# Patient Record
Sex: Female | Born: 1970 | Race: Black or African American | Hispanic: No | Marital: Married | Smoking: Current every day smoker
Health system: Southern US, Community
[De-identification: ages and names within clinical notes are randomized; demographics above are authoritative.]

## PROBLEM LIST (undated history)

## (undated) DIAGNOSIS — I10 Essential (primary) hypertension: Secondary | ICD-10-CM

## (undated) DIAGNOSIS — F32A Depression, unspecified: Secondary | ICD-10-CM

## (undated) DIAGNOSIS — E7800 Pure hypercholesterolemia, unspecified: Secondary | ICD-10-CM

## (undated) DIAGNOSIS — D649 Anemia, unspecified: Secondary | ICD-10-CM

## (undated) DIAGNOSIS — J45909 Unspecified asthma, uncomplicated: Secondary | ICD-10-CM

## (undated) DIAGNOSIS — E119 Type 2 diabetes mellitus without complications: Secondary | ICD-10-CM

## (undated) DIAGNOSIS — R519 Headache, unspecified: Secondary | ICD-10-CM

## (undated) HISTORY — PX: OTHER SURGICAL HISTORY: SHX169

## (undated) HISTORY — PX: TYMPANOSTOMY TUBE PLACEMENT: SHX32

## (undated) HISTORY — PX: TONSILLECTOMY: SUR1361

## (undated) HISTORY — PX: WISDOM TOOTH EXTRACTION: SHX21

---

## 2006-07-31 HISTORY — PX: OTHER SURGICAL HISTORY: SHX169

## 2013-03-07 DIAGNOSIS — F909 Attention-deficit hyperactivity disorder, unspecified type: Secondary | ICD-10-CM | POA: Diagnosis present

## 2013-03-07 DIAGNOSIS — Z720 Tobacco use: Secondary | ICD-10-CM | POA: Diagnosis present

## 2013-03-07 DIAGNOSIS — E119 Type 2 diabetes mellitus without complications: Secondary | ICD-10-CM

## 2013-03-07 DIAGNOSIS — J45909 Unspecified asthma, uncomplicated: Secondary | ICD-10-CM | POA: Diagnosis present

## 2013-03-07 DIAGNOSIS — G43909 Migraine, unspecified, not intractable, without status migrainosus: Secondary | ICD-10-CM | POA: Diagnosis present

## 2017-01-23 DIAGNOSIS — F3132 Bipolar disorder, current episode depressed, moderate: Secondary | ICD-10-CM | POA: Diagnosis present

## 2018-06-04 ENCOUNTER — Emergency Department (HOSPITAL_COMMUNITY)
Admission: EM | Admit: 2018-06-04 | Discharge: 2018-06-04 | Disposition: A | Discharge status: Home / Self Care | Payer: BLUE CROSS/BLUE SHIELD | Attending: Emergency Medicine | Admitting: Emergency Medicine

## 2018-06-04 ENCOUNTER — Emergency Department (HOSPITAL_COMMUNITY): Payer: BLUE CROSS/BLUE SHIELD

## 2018-06-04 ENCOUNTER — Encounter (HOSPITAL_COMMUNITY): Payer: Self-pay | Admitting: Emergency Medicine

## 2018-06-04 ENCOUNTER — Other Ambulatory Visit: Payer: Self-pay

## 2018-06-04 DIAGNOSIS — I10 Essential (primary) hypertension: Secondary | ICD-10-CM | POA: Insufficient documentation

## 2018-06-04 DIAGNOSIS — R739 Hyperglycemia, unspecified: Secondary | ICD-10-CM | POA: Insufficient documentation

## 2018-06-04 DIAGNOSIS — G43909 Migraine, unspecified, not intractable, without status migrainosus: Secondary | ICD-10-CM | POA: Diagnosis not present

## 2018-06-04 DIAGNOSIS — F17210 Nicotine dependence, cigarettes, uncomplicated: Secondary | ICD-10-CM | POA: Diagnosis not present

## 2018-06-04 DIAGNOSIS — Z91040 Latex allergy status: Secondary | ICD-10-CM | POA: Insufficient documentation

## 2018-06-04 DIAGNOSIS — G43009 Migraine without aura, not intractable, without status migrainosus: Secondary | ICD-10-CM

## 2018-06-04 DIAGNOSIS — R4781 Slurred speech: Secondary | ICD-10-CM | POA: Diagnosis present

## 2018-06-04 HISTORY — DX: Essential (primary) hypertension: I10

## 2018-06-04 IMAGING — CT CT HEAD W/O CM
3 series · 16 of 46 positions shown, 19 images · non-contrast
Comparison: None.

CLINICAL DATA: Headache.  Chronic neuro deficit.

EXAM:
CT HEAD WITHOUT CONTRAST
TECHNIQUE: Contiguous axial images were obtained from the base of the skull
through the vertex without intravenous contrast.

[Series 2: head wo · axial · 0.38mm/px · z∈[-30,+90]mm · 10 of 29 slices shown, 13 images]
[im 3/29  brain]
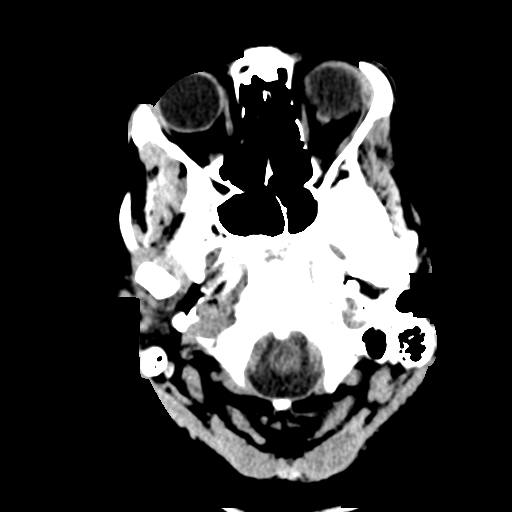
[im 3/29  bone]
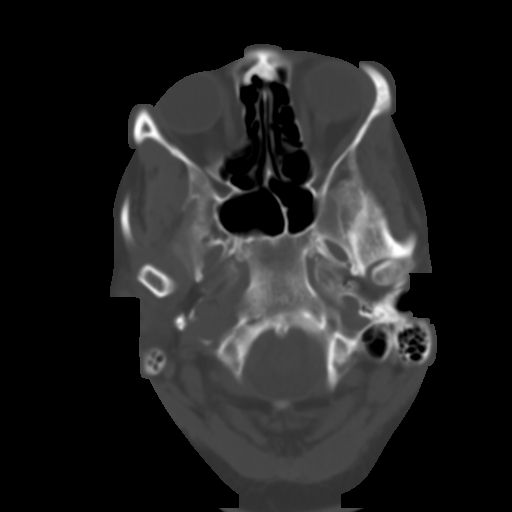
[im 6/29  brain]
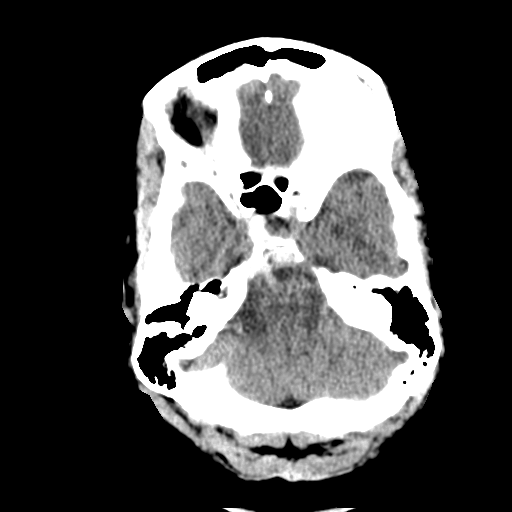
[im 8/29  brain]
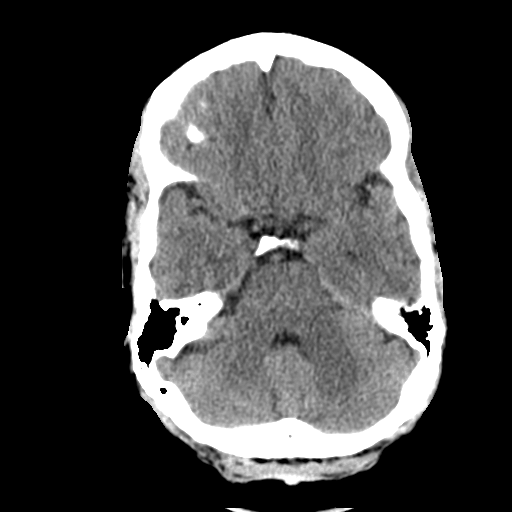
[im 11/29  brain]
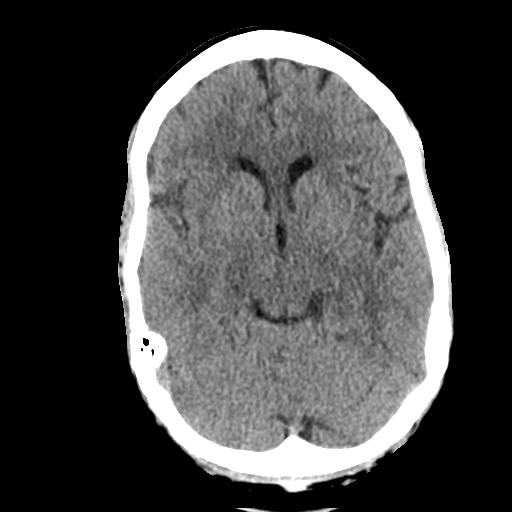
[im 14/29  brain]
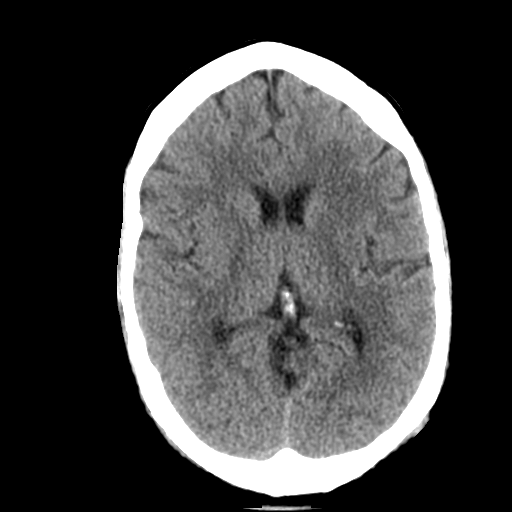
[im 14/29  bone]
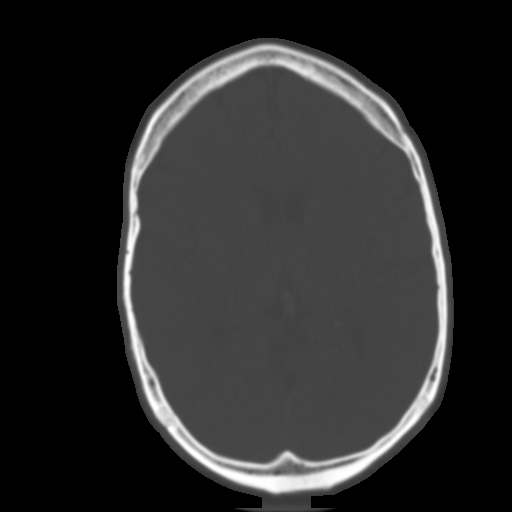
[im 16/29  brain]
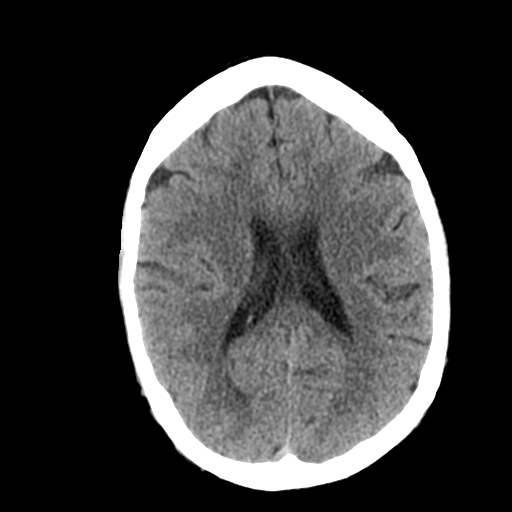
[im 19/29  brain]
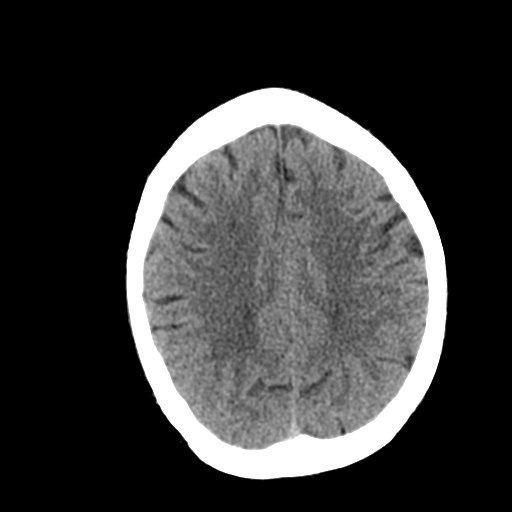
[im 22/29  brain]
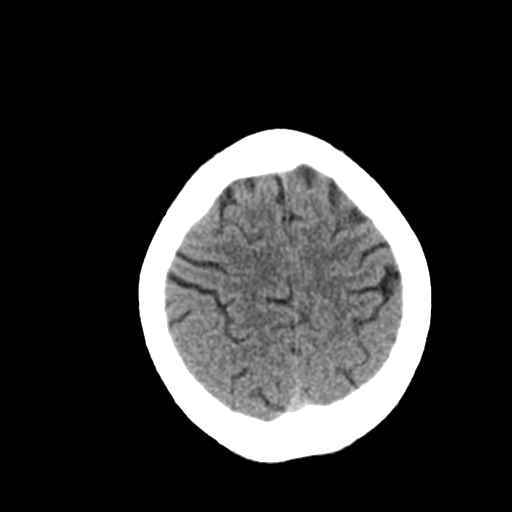
[im 24/29  brain]
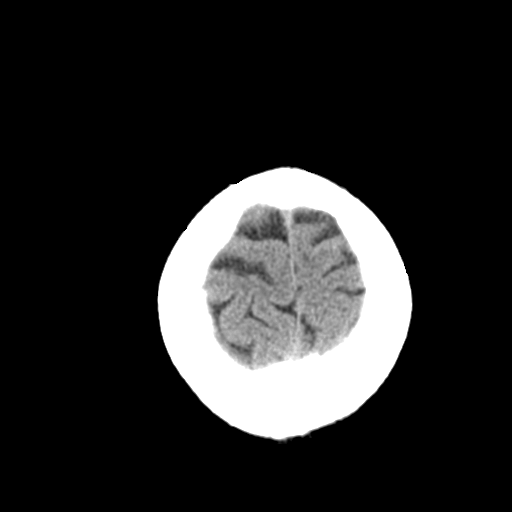
[im 24/29  bone]
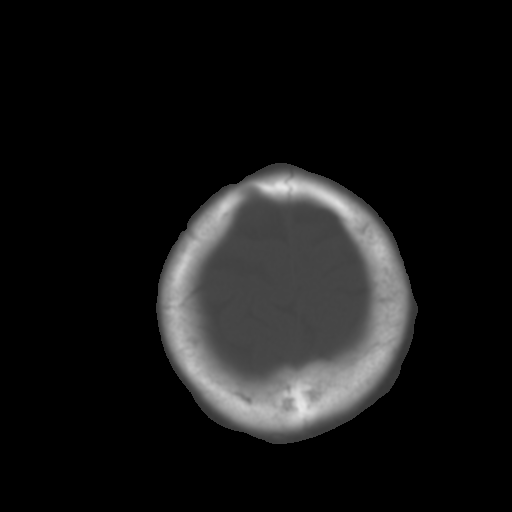
[im 27/29  brain]
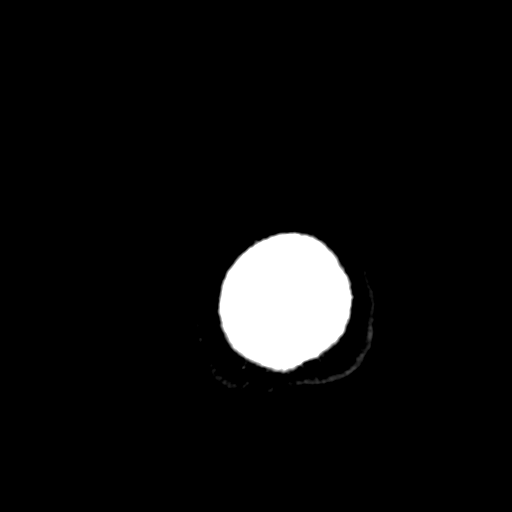

[Series 5: coronal soft tissue · coronal · 0.28mm/px · 3 of 61 slices shown]
[im 21/61  brain]
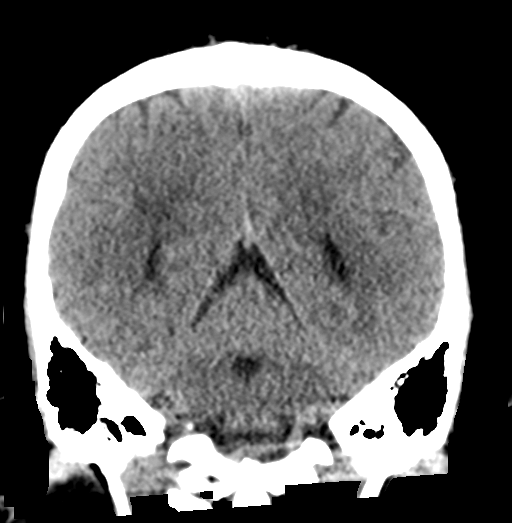
[im 27/61  brain]
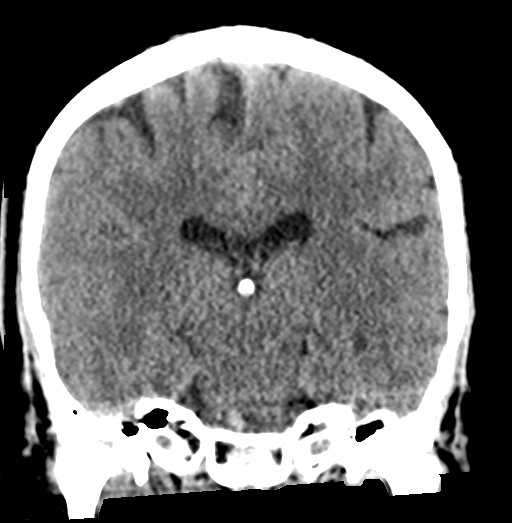
[im 34/61  brain]
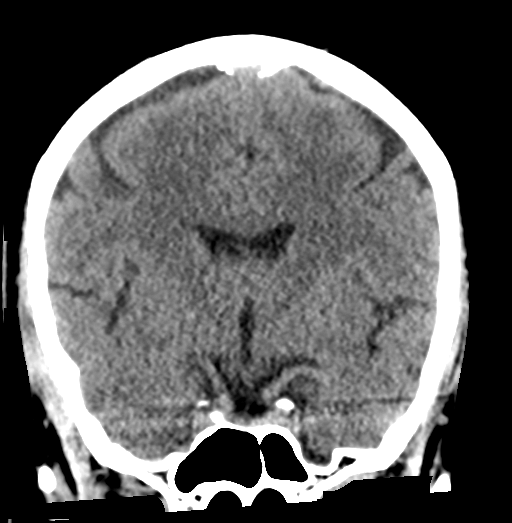

[Series 6: sagittal soft tissue · sagittal · 0.28mm/px · 3 of 48 slices shown]
[im 16/48  brain]
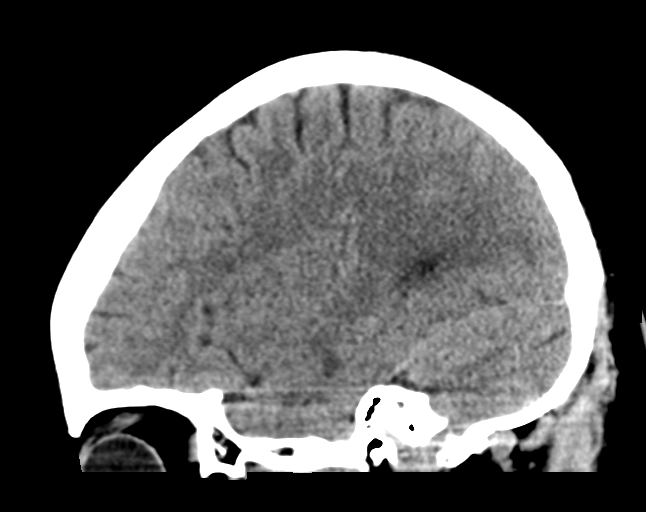
[im 24/48  brain]
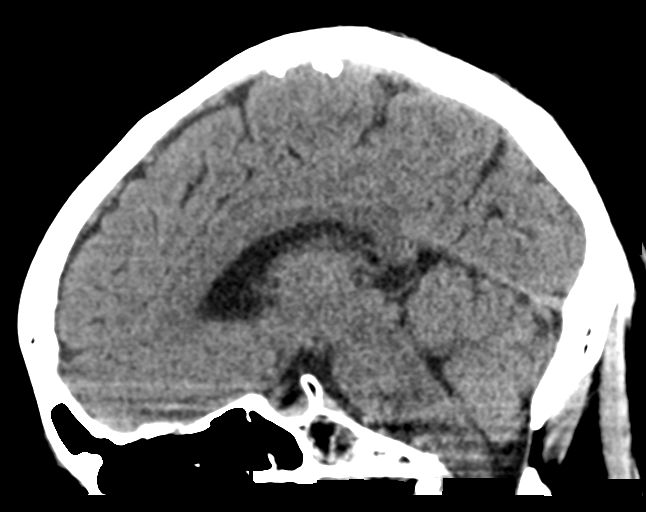
[im 32/48  brain]
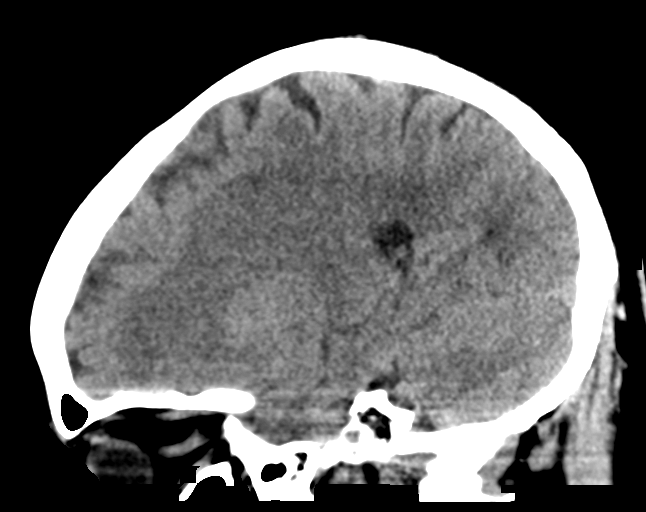

[16 of 46 positions shown; findings below may reference images not displayed]

FINDINGS: Brain: No subdural, epidural, or subarachnoid hemorrhage.
Cerebellum, brainstem, and basal cisterns are normal. Ventricles and
sulci are unremarkable. Mild white matter changes seen posteriorly
in the corona radiata such as on series 2, image 18. No acute
cortical ischemia or infarct is noted. No mass effect or midline
shift.

Vascular: No hyperdense vessel or unexpected calcification.

Skull: Normal. Negative for fracture or focal lesion.

Sinuses/Orbits: No acute finding.

Other: None.
IMPRESSION: 1. Nonspecific white matter changes in the posterior corona radiata
bilaterally. No other significant intracranial abnormalities noted.

## 2018-06-04 MED ORDER — METOCLOPRAMIDE HCL 5 MG/ML IJ SOLN
5.0000 mg | Freq: Once | INTRAMUSCULAR | Status: AC
  Administered 2018-06-04: 5 mg via INTRAVENOUS
  Filled 2018-06-04: qty 2

## 2018-06-04 NOTE — ED Provider Notes (Signed)
[facility]-EMERGENCY DEPT Provider Note   CSN: [id number] Arrival date & time: 06/04/18  1437     History   Chief Complaint Chief Complaint  Patient presents with  . Fall  . multiple complaints  . Headache    HPI Valerie Le - Fischer is a 47 y.o. female.  Patient reports "I think I had a stroke in August 2019.  "  She reports slurred speech difficulty concentrating and intermittent "migraine headaches".  She tripped and fell today.  She reports that she has had somewhat of an unsteady gait since August 2019.  She denies doing herself today.  She presently reports a headache left-sided parietal typical of her "migraine headaches" that she has had for several years.  She treated herself with her migraine medication which she does not recall, without relief.  No other associated symptoms.  She reports no change in her symptoms today.  She just reports "I am tired of it" this particular migraine headache started 3 days ago.  HPI  Past Medical History:  Diagnosis Date  . Hypertension     There are no active problems to display for this patient.   Past Surgical History:  Procedure Laterality Date  . TONSILLECTOMY    . TYMPANOSTOMY TUBE PLACEMENT       OB History   No obstetric history on file.      Home Medications    Prior to Admission medications   Not on File    Family History No family history on file.  Social History Social History   Tobacco Use  . Smoking status: Current Every Day Smoker    Types: Cigarettes  . Smokeless tobacco: Never Used  Substance Use Topics  . Alcohol use: Not on file  . Drug use: Not on file   No alcohol no illicit drug use  Allergies   Coconut oil; Latex; Pineapple; and Tomato   Review of Systems Review of Systems  Constitutional: Negative.   HENT: Negative.   Respiratory: Negative.   Cardiovascular: Negative.   Gastrointestinal: Negative.   Musculoskeletal: Positive for gait problem.   Unsteady gait  Skin: Negative.   Allergic/Immunologic: Positive for immunocompromised state.       Diabetic  Neurological: Positive for speech difficulty and headaches.  Psychiatric/Behavioral: Negative.   All other systems reviewed and are negative.    Physical Exam Updated Vital Signs BP (!) 180/107 (BP Location: Left Arm)   Pulse 87   Temp 98 F (36.7 C) (Oral)   Resp 16   Ht 6' (1.829 m)   Wt 94.3 kg   LMP 05/09/2018   SpO2 99%   BMI 28.21 kg/m   Physical Exam Vitals signs and nursing note reviewed.  Constitutional:      Appearance: She is well-developed.  HENT:     Head: Normocephalic and atraumatic.  Eyes:     Conjunctiva/sclera: Conjunctivae normal.     Pupils: Pupils are equal, round, and reactive to light.  Neck:     Musculoskeletal: Neck supple.     Thyroid: No thyromegaly.     Trachea: No tracheal deviation.  Cardiovascular:     Rate and Rhythm: Normal rate and regular rhythm.     Heart sounds: No murmur.  Pulmonary:     Effort: Pulmonary effort is normal.     Breath sounds: Normal breath sounds.  Abdominal:     General: Bowel sounds are normal. There is no distension.     Palpations: Abdomen is soft.  Tenderness: There is no abdominal tenderness.  Musculoskeletal: Normal range of motion.        General: No tenderness.  Skin:    General: Skin is warm and dry.     Findings: No rash.  Neurological:     General: No focal deficit present.     Mental Status: She is alert and oriented to person, place, and time.     Coordination: Coordination normal.     Comments: Neuro nerves II through XII grossly intact.  Gait normal Romberg normal pronator drift normal finger-to-nose normal DTRs symmetric bilaterally at knee jerk ankle jerk and biceps toes downgoing bilaterally      ED Treatments / Results  Labs (all labs ordered are listed, but only abnormal results are displayed) Labs Reviewed  I-STAT BETA HCG BLOOD, ED ([facility], [facility], [facility] ONLY)     EKG None  Radiology No results found.  Procedures Procedures (including critical care time)  Medications Ordered in ED Medications  metoCLOPramide (REGLAN) injection 5 mg (has no administration in time range)     Initial Impression / Assessment and Plan / ED Course  I have reviewed the triage vital signs and the nursing notes.  Pertinent labs & imaging results that were available during my care of the patient were reviewed by me and considered in my medical decision making (see chart for details).     Lab data i-STAT 8 remarkable for glucose 221 otherwise normal consistent with mild hyperglycemia. No results found for this or any previous visit. Ct Head Wo Contrast  Result Date: 06/04/2018 CLINICAL DATA:  Headache.  Chronic neuro deficit. EXAM: CT HEAD WITHOUT CONTRAST TECHNIQUE: Contiguous axial images were obtained from the base of the skull through the vertex without intravenous contrast. COMPARISON:  None. FINDINGS: Brain: No subdural, epidural, or subarachnoid hemorrhage. Cerebellum, brainstem, and basal cisterns are normal. Ventricles and sulci are unremarkable. Mild white matter changes seen posteriorly in the corona radiata such as on series 2, image 18. No acute cortical ischemia or infarct is noted. No mass effect or midline shift. Vascular: No hyperdense vessel or unexpected calcification. Skull: Normal. Negative for fracture or focal lesion. Sinuses/Orbits: No acute finding. Other: None. IMPRESSION: 1. Nonspecific white matter changes in the posterior corona radiata bilaterally. No other significant intracranial abnormalities noted. Electronically Signed   By: Gerome Prentiss Polio III M.D   On: 06/04/2018 17:19   7:50 PM patient is alert no distress Glasgow Coma Score 15 headache much improved after treatment with intravenous Reglan.  No evidence of stroke.  His neurologic exam was normal I counseled patient for 5 minutes on smoking cessation plan referral neurology,  referral primary care. Final Clinical Impressions(s) / ED Diagnoses  Diagnosis #1 migraine headache #2 hyperglycemia #3 tobacco abuse Final diagnoses:  None    ED Discharge Orders    None       Doug Sou, MD 06/04/18 [phone]

## 2018-06-04 NOTE — ED Triage Notes (Signed)
Pt reports "pt states I think I have had a stroke. The floor has been attacking my feet and having trouble concentrating and having headaches since August."

## 2018-06-04 NOTE — ED Notes (Signed)
Patient transported to CT 

## 2018-06-04 NOTE — Discharge Instructions (Signed)
Blood sugar today was mildly elevated at 221

## 2018-06-05 LAB — I-STAT CHEM 8, ED
BUN: 8 mg/dL (ref 6–20)
Calcium, Ion: 1.13 mmol/L — ABNORMAL LOW (ref 1.15–1.40)
Chloride: 101 mmol/L (ref 98–111)
Creatinine, Ser: 0.4 mg/dL — ABNORMAL LOW (ref 0.44–1.00)
Glucose, Bld: 221 mg/dL — ABNORMAL HIGH (ref 70–99)
HCT: 41 % (ref 36.0–46.0)
HEMOGLOBIN: 13.9 g/dL (ref 12.0–15.0)
Potassium: 3.7 mmol/L (ref 3.5–5.1)
Sodium: 137 mmol/L (ref 135–145)
TCO2: 28 mmol/L (ref 22–32)

## 2018-08-21 DIAGNOSIS — E1169 Type 2 diabetes mellitus with other specified complication: Secondary | ICD-10-CM | POA: Diagnosis present

## 2018-08-21 DIAGNOSIS — E785 Hyperlipidemia, unspecified: Secondary | ICD-10-CM | POA: Diagnosis present

## 2019-11-08 ENCOUNTER — Ambulatory Visit: Payer: BLUE CROSS/BLUE SHIELD | Attending: Internal Medicine

## 2019-11-08 DIAGNOSIS — Z23 Encounter for immunization: Secondary | ICD-10-CM

## 2019-11-08 NOTE — Progress Notes (Signed)
   Covid-19 Vaccination Clinic  Name:  Valerie Le    MRN: [id number] DOB: December 02, 1970  11/08/2019  Ms. Valerie Le was observed post Covid-19 immunization for 15 minutes without incident. She was provided with Vaccine Information Sheet and instruction to access the V-Safe system.   Ms. Valerie Le was instructed to call 911 with any severe reactions post vaccine: Marland Kitchen Difficulty breathing  . Swelling of face and throat  . A fast heartbeat  . A bad rash all over body  . Dizziness and weakness   Immunizations Administered    Name Date Dose VIS Date Route   Pfizer COVID-19 Vaccine 11/08/2019 10:44 AM 0.3 mL 08/13/2018 Intramuscular   Manufacturer: [employer], [employer]   Lot: [id number]   NDC: [address]-3

## 2019-11-29 ENCOUNTER — Ambulatory Visit: Payer: BLUE CROSS/BLUE SHIELD | Attending: Internal Medicine

## 2019-11-29 DIAGNOSIS — Z23 Encounter for immunization: Secondary | ICD-10-CM

## 2019-11-29 NOTE — Progress Notes (Signed)
   Covid-19 Vaccination Clinic  Name:  Barrie Sigmund    MRN: [id number] DOB: June 25, 1970  11/29/2019  Ms. Antonieta Pert Fischer was observed post Covid-19 immunization for 30 minutes based on pre-vaccination screening without incident. She was provided with Vaccine Information Sheet and instruction to access the V-Safe system.   Ms. Della Goo was instructed to call 911 with any severe reactions post vaccine: Marland Kitchen Difficulty breathing  . Swelling of face and throat  . A fast heartbeat  . A bad rash all over body  . Dizziness and weakness   Immunizations Administered    Name Date Dose VIS Date Route   Pfizer COVID-19 Vaccine 11/29/2019 10:22 AM 0.3 mL 08/13/2018 Intramuscular   Manufacturer: [employer], [employer]   Lot: [id number]   NDC: [address]-9

## 2019-12-01 ENCOUNTER — Ambulatory Visit: Payer: BLUE CROSS/BLUE SHIELD

## 2020-12-02 ENCOUNTER — Emergency Department (HOSPITAL_BASED_OUTPATIENT_CLINIC_OR_DEPARTMENT_OTHER): Payer: Medicaid Other

## 2020-12-02 ENCOUNTER — Encounter (HOSPITAL_BASED_OUTPATIENT_CLINIC_OR_DEPARTMENT_OTHER): Payer: Self-pay

## 2020-12-02 ENCOUNTER — Other Ambulatory Visit: Payer: Self-pay

## 2020-12-02 ENCOUNTER — Inpatient Hospital Stay (HOSPITAL_BASED_OUTPATIENT_CLINIC_OR_DEPARTMENT_OTHER)
Admission: EM | Admit: 2020-12-02 | Discharge: 2020-12-09 | DRG: 617 | Disposition: A | Discharge status: Home Health | Payer: Medicaid Other | Attending: Internal Medicine | Admitting: Internal Medicine

## 2020-12-02 DIAGNOSIS — L97519 Non-pressure chronic ulcer of other part of right foot with unspecified severity: Secondary | ICD-10-CM | POA: Diagnosis present

## 2020-12-02 DIAGNOSIS — Z6830 Body mass index (BMI) 30.0-30.9, adult: Secondary | ICD-10-CM | POA: Diagnosis not present

## 2020-12-02 DIAGNOSIS — I10 Essential (primary) hypertension: Secondary | ICD-10-CM | POA: Diagnosis present

## 2020-12-02 DIAGNOSIS — L03115 Cellulitis of right lower limb: Secondary | ICD-10-CM | POA: Diagnosis present

## 2020-12-02 DIAGNOSIS — F909 Attention-deficit hyperactivity disorder, unspecified type: Secondary | ICD-10-CM | POA: Diagnosis present

## 2020-12-02 DIAGNOSIS — Z20822 Contact with and (suspected) exposure to covid-19: Secondary | ICD-10-CM | POA: Diagnosis present

## 2020-12-02 DIAGNOSIS — E1169 Type 2 diabetes mellitus with other specified complication: Principal | ICD-10-CM | POA: Diagnosis present

## 2020-12-02 DIAGNOSIS — J45909 Unspecified asthma, uncomplicated: Secondary | ICD-10-CM | POA: Diagnosis present

## 2020-12-02 DIAGNOSIS — D509 Iron deficiency anemia, unspecified: Secondary | ICD-10-CM | POA: Diagnosis present

## 2020-12-02 DIAGNOSIS — L089 Local infection of the skin and subcutaneous tissue, unspecified: Secondary | ICD-10-CM | POA: Diagnosis present

## 2020-12-02 DIAGNOSIS — Z794 Long term (current) use of insulin: Secondary | ICD-10-CM | POA: Diagnosis not present

## 2020-12-02 DIAGNOSIS — E1140 Type 2 diabetes mellitus with diabetic neuropathy, unspecified: Secondary | ICD-10-CM | POA: Diagnosis present

## 2020-12-02 DIAGNOSIS — Z720 Tobacco use: Secondary | ICD-10-CM | POA: Diagnosis present

## 2020-12-02 DIAGNOSIS — E785 Hyperlipidemia, unspecified: Secondary | ICD-10-CM | POA: Diagnosis present

## 2020-12-02 DIAGNOSIS — E1100 Type 2 diabetes mellitus with hyperosmolarity without nonketotic hyperglycemic-hyperosmolar coma (NKHHC): Secondary | ICD-10-CM | POA: Diagnosis present

## 2020-12-02 DIAGNOSIS — F17210 Nicotine dependence, cigarettes, uncomplicated: Secondary | ICD-10-CM | POA: Diagnosis present

## 2020-12-02 DIAGNOSIS — F3132 Bipolar disorder, current episode depressed, moderate: Secondary | ICD-10-CM | POA: Diagnosis present

## 2020-12-02 DIAGNOSIS — E7800 Pure hypercholesterolemia, unspecified: Secondary | ICD-10-CM | POA: Diagnosis present

## 2020-12-02 DIAGNOSIS — M86171 Other acute osteomyelitis, right ankle and foot: Secondary | ICD-10-CM | POA: Diagnosis present

## 2020-12-02 DIAGNOSIS — I96 Gangrene, not elsewhere classified: Secondary | ICD-10-CM | POA: Diagnosis present

## 2020-12-02 DIAGNOSIS — Z9641 Presence of insulin pump (external) (internal): Secondary | ICD-10-CM | POA: Diagnosis present

## 2020-12-02 DIAGNOSIS — E11621 Type 2 diabetes mellitus with foot ulcer: Secondary | ICD-10-CM | POA: Diagnosis present

## 2020-12-02 DIAGNOSIS — E11628 Type 2 diabetes mellitus with other skin complications: Secondary | ICD-10-CM | POA: Diagnosis present

## 2020-12-02 DIAGNOSIS — G43909 Migraine, unspecified, not intractable, without status migrainosus: Secondary | ICD-10-CM | POA: Diagnosis present

## 2020-12-02 DIAGNOSIS — R739 Hyperglycemia, unspecified: Secondary | ICD-10-CM | POA: Diagnosis not present

## 2020-12-02 DIAGNOSIS — E119 Type 2 diabetes mellitus without complications: Secondary | ICD-10-CM

## 2020-12-02 DIAGNOSIS — E1152 Type 2 diabetes mellitus with diabetic peripheral angiopathy with gangrene: Secondary | ICD-10-CM | POA: Diagnosis present

## 2020-12-02 HISTORY — DX: Pure hypercholesterolemia, unspecified: E78.00

## 2020-12-02 HISTORY — DX: Type 2 diabetes mellitus without complications: E11.9

## 2020-12-02 LAB — COMPREHENSIVE METABOLIC PANEL
ALT: 13 U/L (ref 0–44)
AST: 10 U/L — ABNORMAL LOW (ref 15–41)
Albumin: 2.9 g/dL — ABNORMAL LOW (ref 3.5–5.0)
Alkaline Phosphatase: 116 U/L (ref 38–126)
Anion gap: 10 (ref 5–15)
BUN: 13 mg/dL (ref 6–20)
CO2: 28 mmol/L (ref 22–32)
Calcium: 8.5 mg/dL — ABNORMAL LOW (ref 8.9–10.3)
Chloride: 92 mmol/L — ABNORMAL LOW (ref 98–111)
Creatinine, Ser: 0.75 mg/dL (ref 0.44–1.00)
GFR, Estimated: >60 mL/min (ref >60)
Glucose, Bld: 509 mg/dL (ref 70–99)
Potassium: 3.5 mmol/L (ref 3.5–5.1)
Sodium: 130 mmol/L — ABNORMAL LOW (ref 135–145)
Total Bilirubin: 0.5 mg/dL (ref 0.3–1.2)
Total Protein: 7.8 g/dL (ref 6.5–8.1)

## 2020-12-02 LAB — CBC WITH DIFFERENTIAL/PLATELET
Abs Immature Granulocytes: 0.26 10*3/uL — ABNORMAL HIGH (ref 0.00–0.07)
Basophils Absolute: 0.1 10*3/uL (ref 0.0–0.1)
Basophils Relative: 1 %
Eosinophils Absolute: 0.3 10*3/uL (ref 0.0–0.5)
Eosinophils Relative: 2 %
HCT: 34.6 % — ABNORMAL LOW (ref 36.0–46.0)
Hemoglobin: 11.3 g/dL — ABNORMAL LOW (ref 12.0–15.0)
Immature Granulocytes: 2 %
Lymphocytes Relative: 12 %
Lymphs Abs: 1.8 10*3/uL (ref 0.7–4.0)
MCH: 24.2 pg — ABNORMAL LOW (ref 26.0–34.0)
MCHC: 32.7 g/dL (ref 30.0–36.0)
MCV: 74.1 fL — ABNORMAL LOW (ref 80.0–100.0)
Monocytes Absolute: 0.9 10*3/uL (ref 0.1–1.0)
Monocytes Relative: 6 %
Neutro Abs: 11.6 10*3/uL — ABNORMAL HIGH (ref 1.7–7.7)
Neutrophils Relative %: 77 %
Platelets: 249 10*3/uL (ref 150–400)
RBC: 4.67 MIL/uL (ref 3.87–5.11)
RDW: 15.9 % — ABNORMAL HIGH (ref 11.5–15.5)
WBC: 15 10*3/uL — ABNORMAL HIGH (ref 4.0–10.5)
nRBC: 0 % (ref 0.0–0.2)

## 2020-12-02 LAB — CBG MONITORING, ED
Glucose-Capillary: 488 mg/dL — ABNORMAL HIGH (ref 70–99)
Glucose-Capillary: 410 mg/dL — ABNORMAL HIGH (ref 70–99)

## 2020-12-02 LAB — LACTIC ACID, PLASMA
Lactic Acid, Venous: 1.1 mmol/L (ref 0.5–1.9)
Lactic Acid, Venous: 1.1 mmol/L (ref 0.5–1.9)

## 2020-12-02 LAB — GLUCOSE, CAPILLARY
Glucose-Capillary: 249 mg/dL — ABNORMAL HIGH (ref 70–99)
Glucose-Capillary: 326 mg/dL — ABNORMAL HIGH (ref 70–99)

## 2020-12-02 LAB — RESP PANEL BY RT-PCR (FLU A&B, COVID) ARPGX2
Influenza A by PCR: NEGATIVE
Influenza B by PCR: NEGATIVE
SARS Coronavirus 2 by RT PCR: NEGATIVE

## 2020-12-02 IMAGING — DX DG FOOT COMPLETE 3+V*R*
3 series · 3 of 3 positions shown · non-contrast
Comparison: None.

CLINICAL DATA: Foot ulcer.  Hyperglycemia

EXAM:
RIGHT FOOT COMPLETE - 3+ VIEW

[foot ap]
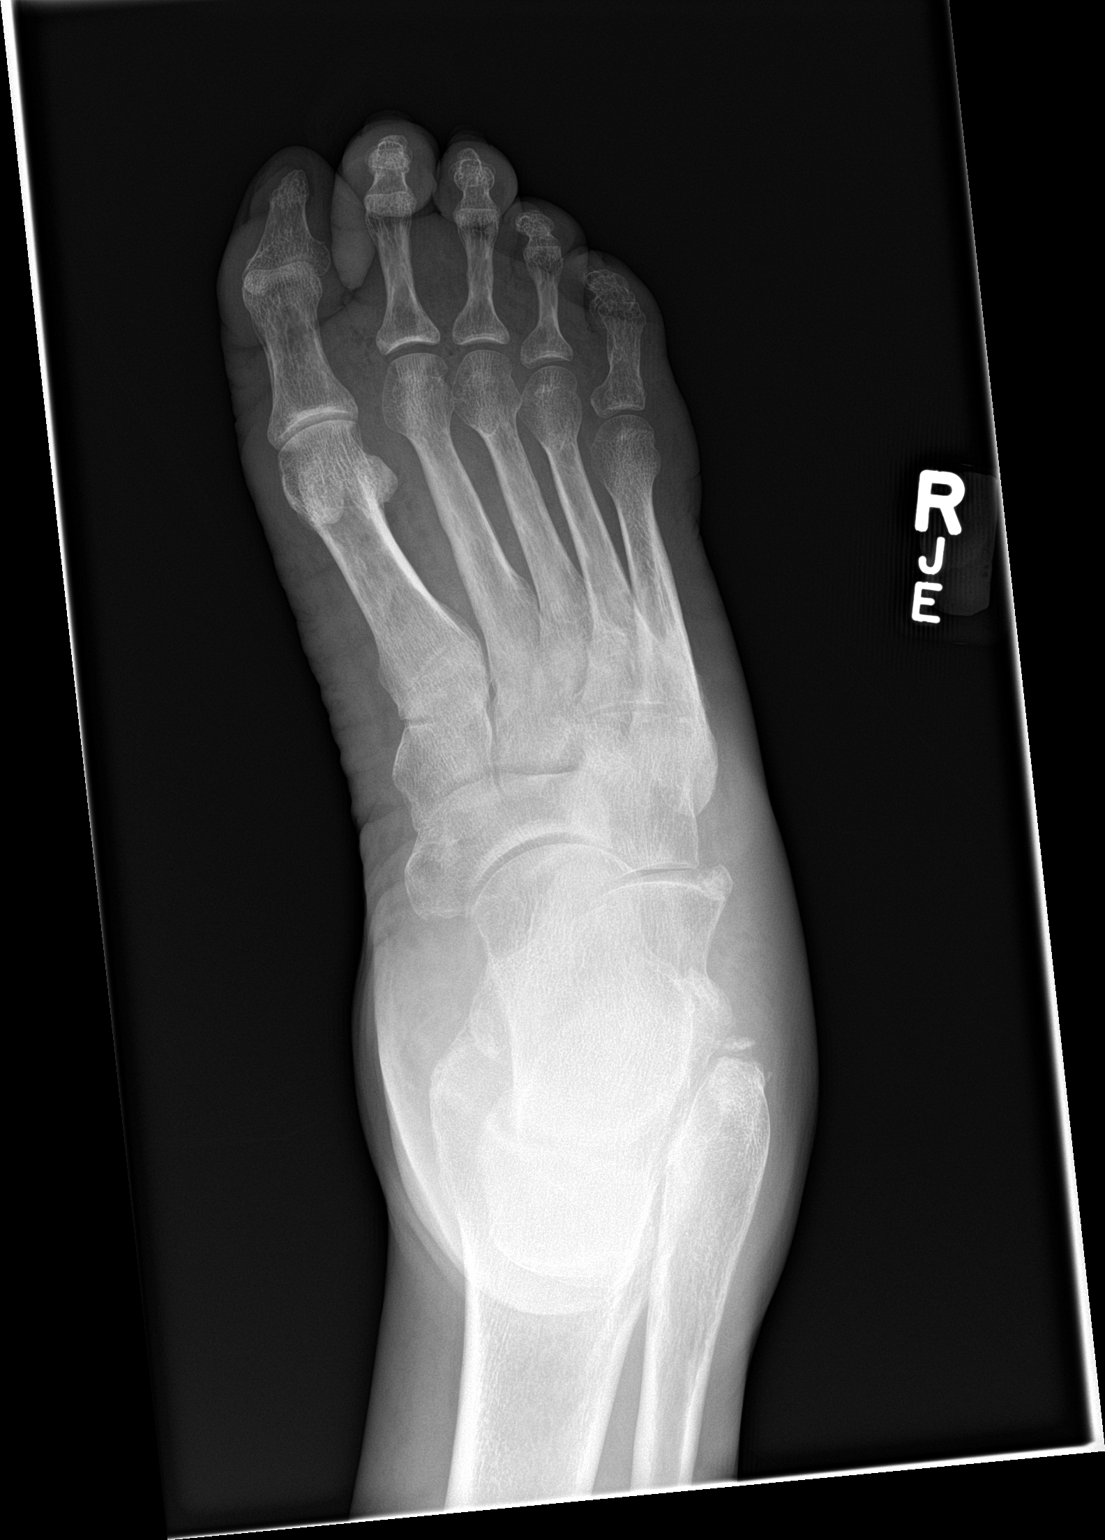

[foot obl]
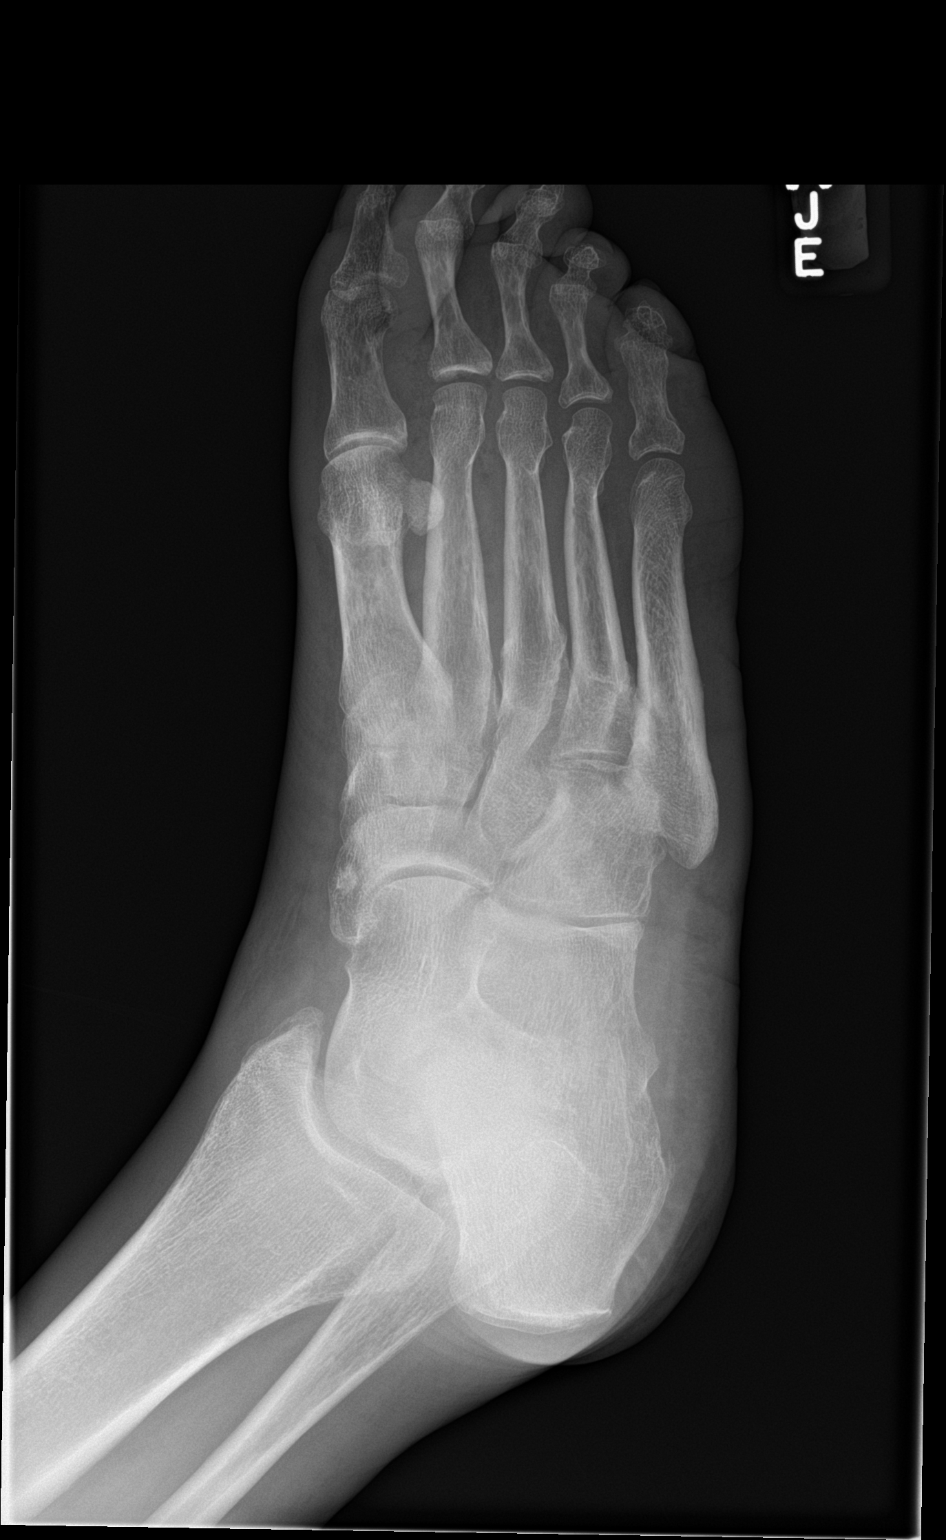

[foot lat]
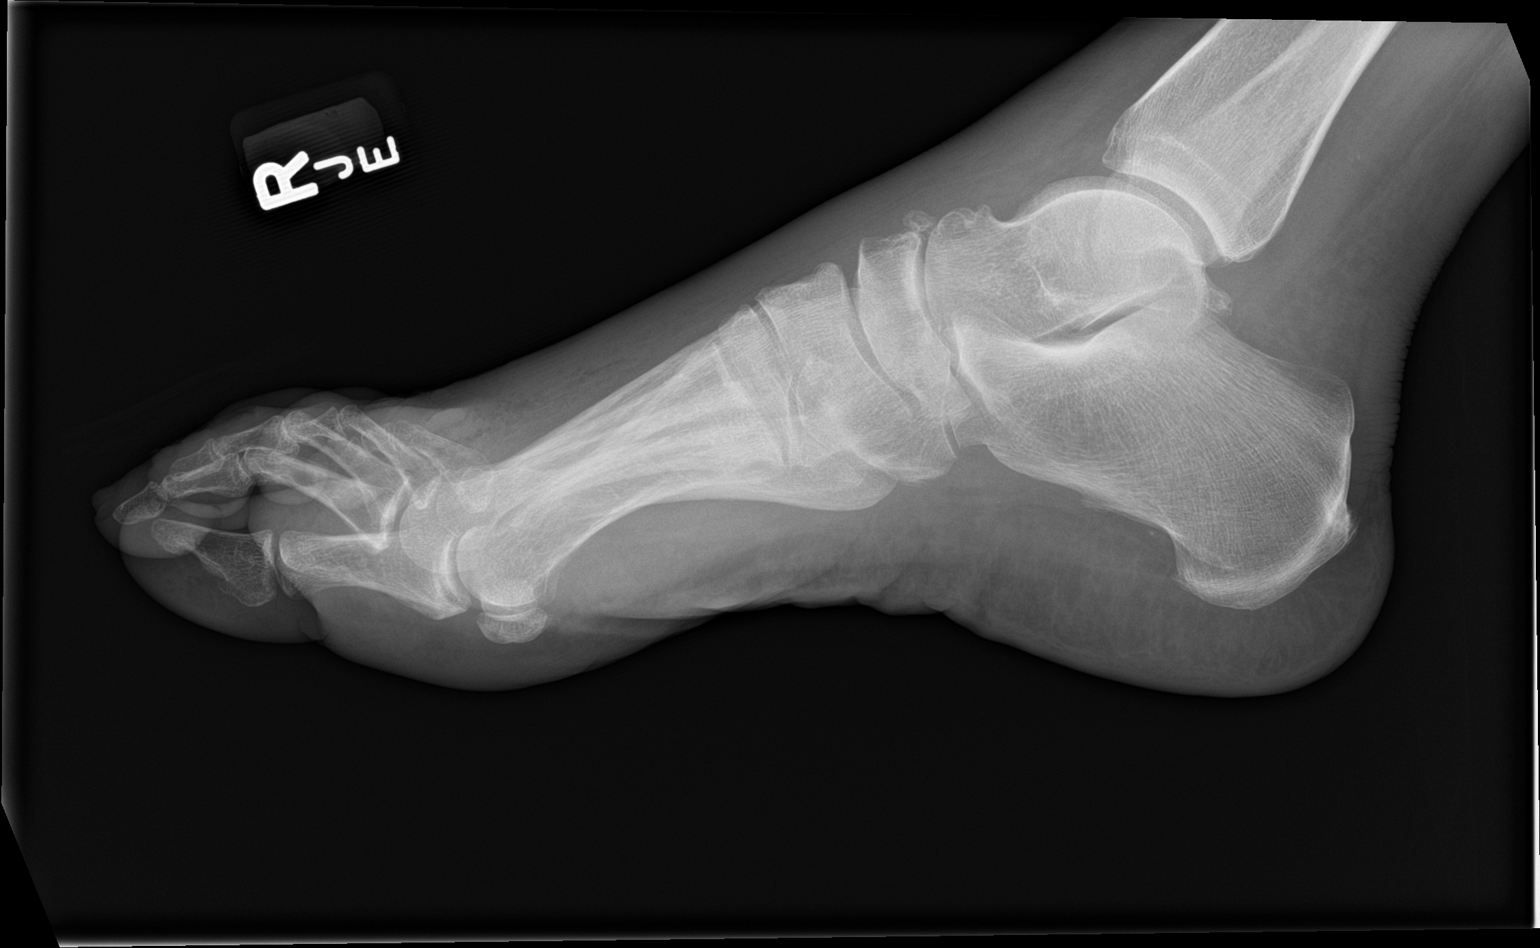

[3 of 3 positions shown; findings below may reference images not displayed]

FINDINGS: No evidence of acute fracture or dislocation in the right foot. No
focal bone lesion or cortical changes to suggest osteomyelitis. Old
ununited ossicles at the distal fibula, calcaneal cuboidal region,
and navicular region. Mild dorsal soft tissue swelling with soft
tissue gas demonstrated over the dorsum of the forefoot. This
suggests cellulitis with gas-forming organism. No radiopaque soft
tissue foreign bodies.
IMPRESSION: No acute fracture or dislocation. Soft tissue swelling with soft
tissue gas consistent with cellulitis. No radiographic changes of
osteomyelitis.

## 2020-12-02 MED ORDER — LACTATED RINGERS IV SOLN: INTRAVENOUS | Status: DC

## 2020-12-02 MED ORDER — VANCOMYCIN HCL IN DEXTROSE 1-5 GM/200ML-% IV SOLN
1000.0000 mg | Freq: Once | INTRAVENOUS | Status: AC
  Administered 2020-12-02: 1000 mg via INTRAVENOUS
  Filled 2020-12-02: qty 200

## 2020-12-02 MED ORDER — INSULIN ASPART 100 UNIT/ML IJ SOLN: 0.0000 [IU] | Freq: Three times a day (TID) | INTRAMUSCULAR | Status: DC

## 2020-12-02 MED ORDER — INSULIN REGULAR(HUMAN) IN NACL 100-0.9 UT/100ML-% IV SOLN
INTRAVENOUS | Status: DC
  Administered 2020-12-02: 13 [IU]/h via INTRAVENOUS
  Filled 2020-12-02: qty 100

## 2020-12-02 MED ORDER — SODIUM CHLORIDE 0.9 % IV SOLN
2.0000 g | Freq: Three times a day (TID) | INTRAVENOUS | Status: DC
  Administered 2020-12-03 – 2020-12-09 (×19): 2 g via INTRAVENOUS
  Filled 2020-12-02 (×21): qty 2

## 2020-12-02 MED ORDER — ONDANSETRON HCL 4 MG PO TABS: 4.0000 mg | ORAL_TABLET | Freq: Four times a day (QID) | ORAL | Status: DC | PRN

## 2020-12-02 MED ORDER — MORPHINE SULFATE (PF) 2 MG/ML IV SOLN
2.0000 mg | INTRAVENOUS | Status: DC | PRN
  Administered 2020-12-03 – 2020-12-06 (×9): 2 mg via INTRAVENOUS
  Filled 2020-12-02 (×9): qty 1

## 2020-12-02 MED ORDER — ONDANSETRON HCL 4 MG/2ML IJ SOLN
4.0000 mg | Freq: Four times a day (QID) | INTRAMUSCULAR | Status: DC | PRN
  Administered 2020-12-05 – 2020-12-08 (×5): 4 mg via INTRAVENOUS
  Filled 2020-12-02 (×2): qty 2

## 2020-12-02 MED ORDER — DEXTROSE 50 % IV SOLN: 0.0000 mL | INTRAVENOUS | Status: DC | PRN

## 2020-12-02 MED ORDER — ACETAMINOPHEN 650 MG RE SUPP: 650.0000 mg | Freq: Four times a day (QID) | RECTAL | Status: DC | PRN

## 2020-12-02 MED ORDER — INSULIN ASPART 100 UNIT/ML IJ SOLN: 0.0000 [IU] | Freq: Every day | INTRAMUSCULAR | Status: DC

## 2020-12-02 MED ORDER — SODIUM CHLORIDE 0.9 % IV BOLUS
500.0000 mL | Freq: Once | INTRAVENOUS | Status: AC
  Administered 2020-12-02: 500 mL via INTRAVENOUS

## 2020-12-02 MED ORDER — ACETAMINOPHEN 325 MG PO TABS
650.0000 mg | ORAL_TABLET | Freq: Four times a day (QID) | ORAL | Status: DC | PRN
  Administered 2020-12-03 (×2): 650 mg via ORAL
  Filled 2020-12-02 (×3): qty 2

## 2020-12-02 MED ORDER — CHLORHEXIDINE GLUCONATE CLOTH 2 % EX PADS
6.0000 | MEDICATED_PAD | Freq: Every day | CUTANEOUS | Status: DC
  Administered 2020-12-03 – 2020-12-09 (×6): 6 via TOPICAL

## 2020-12-02 MED ORDER — ENOXAPARIN SODIUM 40 MG/0.4ML IJ SOSY
40.0000 mg | PREFILLED_SYRINGE | INTRAMUSCULAR | Status: DC
  Administered 2020-12-03 – 2020-12-09 (×7): 40 mg via SUBCUTANEOUS
  Filled 2020-12-02 (×7): qty 0.4

## 2020-12-02 MED ORDER — DEXTROSE IN LACTATED RINGERS 5 % IV SOLN: INTRAVENOUS | Status: DC

## 2020-12-02 MED ORDER — VANCOMYCIN HCL 750 MG/150ML IV SOLN
750.0000 mg | Freq: Three times a day (TID) | INTRAVENOUS | Status: DC
  Administered 2020-12-03 – 2020-12-07 (×13): 750 mg via INTRAVENOUS
  Filled 2020-12-02 (×14): qty 150

## 2020-12-02 MED ORDER — VANCOMYCIN HCL IN DEXTROSE 750-5 MG/150ML-% IV SOLN
750.0000 mg | Freq: Three times a day (TID) | INTRAVENOUS | Status: DC
  Filled 2020-12-02: qty 150

## 2020-12-02 NOTE — ED Triage Notes (Signed)
PT has had wound to top of right foot for a few months. Has seen wound clinic. Since Friday wound worsening. Foot swollen and red with discharge. States blood sugars have been high since Friday as well, running in 400-500s.

## 2020-12-02 NOTE — Progress Notes (Signed)
Pharmacy Antibiotic Note  Valerie Le is a 50 y.o. female admitted on 12/02/2020 with past medical history of diabetes, hypertension, high cholesterol, presents to the emergency department with wound to the top of the right foot  Pharmacy has been consulted to dose vancomycin and cefepime for wound infection.  Plan: Vancomycin 1gm IV x 1 then 750mg  q8h (AUC 519.1, Scr 0.75) Cefepime 2gm IV q8h Follow renal function and clinical course  Height: 5\' 11"  (180.3 cm) Weight: 98.9 kg (218 lb) IBW/kg (Calculated) : 70.8  Temp (24hrs), Avg:98.3 F (36.8 C), Min:98.1 F (36.7 C), Max:98.4 F (36.9 C)  Recent Labs  Lab 12/02/20 1906 12/02/20 2041  WBC 15.0*  --   CREATININE 0.75  --   LATICACIDVEN 1.1 1.1    Estimated Creatinine Clearance: 108.9 mL/min (by C-G formula based on SCr of 0.75 mg/dL).    Allergies  Allergen Reactions   Coconut Oil Anaphylaxis   Pineapple Anaphylaxis   Tomato Anaphylaxis   Latex Hives   Metformin And Related Other (See Comments)    Constipation/cramps    Antimicrobials this admission: 6/16 vanc >> 6/17 cefepime >>  Dose adjustments this admission:   Microbiology results: 6/16 BCx:  6/16 MRSA PCR:   Thank you for allowing pharmacy to be a part of this patient's care.  7/16 RPh 12/02/2020, 11:50 PM

## 2020-12-02 NOTE — ED Provider Notes (Signed)
Emergency Department Provider Note   I have reviewed the triage vital signs and the nursing notes.   HISTORY  Chief Complaint Wound Infection and Hyperglycemia   HPI Valerie Le is a 50 y.o. female with past medical history of diabetes, hypertension, high cholesterol, presents to the emergency department with wound to the top of the right foot.  She states she is been dealing with issues with this foot since December.  She is followed at the [facility] wound care center and has been following there since that time.  She noticed sudden worsening of the wound on the right foot with some drainage and redness/swelling over the right foot over the past several days.  She had a leftover prescription for amoxicillin and began taking that for the past 2 days with continued/worsening symptoms.  She denies fevers.  She has mild pain in the foot but notes a history of neuropathy there as well.  Denies injury.   Past Medical History:  Diagnosis Date   Diabetes mellitus without complication (HCC)    High cholesterol    Hypertension     There are no problems to display for this patient.   Past Surgical History:  Procedure Laterality Date   TONSILLECTOMY     TYMPANOSTOMY TUBE PLACEMENT      Allergies Coconut oil, Pineapple, Tomato, Latex, and Metformin and related  History reviewed. No pertinent family history.  Social History Social History   Tobacco Use   Smoking status: Every Day    Pack years: 0.00    Types: Cigarettes   Smokeless tobacco: Never  Vaping Use   Vaping Use: Never used  Substance Use Topics   Alcohol use: Never   Drug use: Never    Review of Systems  Constitutional: No fever/chills Eyes: No visual changes. ENT: No sore throat. Cardiovascular: Denies chest pain. Respiratory: Denies shortness of breath. Gastrointestinal: No abdominal pain.  No nausea, no vomiting.  No diarrhea.  No constipation. Genitourinary: Negative for dysuria. Musculoskeletal:  Negative for back pain. Skin: Wound to the right foot w/ drainage.  Neurological: Negative for headaches, focal weakness or numbness.  10-point ROS otherwise negative.  ____________________________________________   PHYSICAL EXAM:  VITAL SIGNS: ED Triage Vitals  Enc Vitals Group     BP 12/02/20 1707 134/83     Pulse Rate 12/02/20 1707 (!) 116     Resp 12/02/20 1707 18     Temp 12/02/20 1707 98.4 F (36.9 C)     Temp Source 12/02/20 1707 Oral     SpO2 12/02/20 1707 98 %     Weight 12/02/20 1708 221 lb (100.2 kg)     Height 12/02/20 1708 5\' 11"  (1.803 m)   Constitutional: Alert and oriented. Well appearing and in no acute distress. Eyes: Conjunctivae are normal.  Head: Atraumatic. Nose: No congestion/rhinnorhea. Mouth/Throat: Mucous membranes are moist.   Neck: No stridor. Cardiovascular: Normal rate, regular rhythm. Good peripheral circulation. Grossly normal heart sounds.   Respiratory: Normal respiratory effort.  No retractions. Lungs CTAB. Gastrointestinal: Soft and nontender. No distention.  Musculoskeletal: No lower extremity tenderness nor edema. No gross deformities of extremities. Neurologic:  Normal speech and language. No gross focal neurologic deficits are appreciated.  Skin:  Skin is warm and dry.    ____________________________________________   LABS (all labs ordered are listed, but only abnormal results are displayed)  Labs Reviewed  COMPREHENSIVE METABOLIC PANEL - Abnormal; Notable for the following components:      Result Value  Sodium 130 (*)    Chloride 92 (*)    Glucose, Bld 509 (*)    Calcium 8.5 (*)    Albumin 2.9 (*)    AST 10 (*)    All other components within normal limits  CBC WITH DIFFERENTIAL/PLATELET - Abnormal; Notable for the following components:   WBC 15.0 (*)    Hemoglobin 11.3 (*)    HCT 34.6 (*)    MCV 74.1 (*)    MCH 24.2 (*)    RDW 15.9 (*)    Neutro Abs 11.6 (*)    Abs Immature Granulocytes 0.26 (*)    All other  components within normal limits  CBG MONITORING, ED - Abnormal; Notable for the following components:   Glucose-Capillary 488 (*)    All other components within normal limits  RESP PANEL BY RT-PCR (FLU A&B, COVID) [id number]  CULTURE, BLOOD (ROUTINE X 2)  CULTURE, BLOOD (ROUTINE X 2)  LACTIC ACID, PLASMA  LACTIC ACID, PLASMA   ____________________________________________  RADIOLOGY  DG Foot Complete Right  Result Date: 12/02/2020 CLINICAL DATA:  Foot ulcer.  Hyperglycemia EXAM: RIGHT FOOT COMPLETE - 3+ VIEW COMPARISON:  None. FINDINGS: No evidence of acute fracture or dislocation in the right foot. No focal bone lesion or cortical changes to suggest osteomyelitis. 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 Mild dorsal soft tissue swelling with soft tissue gas demonstrated over the dorsum of the forefoot. This suggests cellulitis with gas-forming organism. No radiopaque soft tissue foreign bodies. IMPRESSION: No acute fracture or dislocation. Soft tissue swelling with soft tissue gas consistent with cellulitis. No radiographic changes of osteomyelitis. Electronically Signed   By: Burman Nieves M.D.   On: 12/02/2020 18:02    ____________________________________________   PROCEDURES  Procedure(s) performed:   Procedures  CRITICAL CARE Performed by: Maia Plan Total critical care time: 35 minutes Critical care time was exclusive of separately billable procedures and treating other patients. Critical care was necessary to treat or prevent imminent or life-threatening deterioration. Critical care was time spent personally by me on the following activities: development of treatment plan with patient and/or surrogate as well as nursing, discussions with consultants, evaluation of patient's response to treatment, examination of patient, obtaining history from patient or surrogate, ordering and performing treatments and interventions, ordering  and review of laboratory studies, ordering and review of radiographic studies, pulse oximetry and re-evaluation of patient's condition.  Alona Bene, MD Emergency Medicine  ____________________________________________   INITIAL IMPRESSION / ASSESSMENT AND PLAN / ED COURSE  Pertinent labs & imaging results that were available during my care of the patient were reviewed by me and considered in my medical decision making (see chart for details).   Patient with diabetic foot wound worsening over the past week despite self starting amoxicillin.  The foot is diffusely erythematous with wound along the dorsum of the foot.  The x-ray here shows no evidence of obvious osteomyelitis.  If clinical concern persists may consider MRI as an inpatient.  I do think the patient would benefit from inpatient stay.  She is very hyperglycemic with a leukocytosis.  Her lactic acid is normal.  Her tachycardia present on arrival has improved with IV fluids.  Will start vancomycin.   Discussed patient's case with [facility] to request admission. Patient and family (if present) updated with plan. Care transferred to [facility] service.  I reviewed all nursing notes, vitals, pertinent old records, EKGs, labs, imaging (as available).  ____________________________________________  FINAL CLINICAL  IMPRESSION(S) / ED DIAGNOSES  Final diagnoses:  Cellulitis of right lower extremity  Hyperglycemia     MEDICATIONS GIVEN DURING THIS VISIT:  Medications  vancomycin (VANCOCIN) IVPB 1000 mg/200 mL premix (has no administration in time range)  insulin regular, human (MYXREDLIN) 100 units/ 100 mL infusion (has no administration in time range)  lactated ringers infusion (has no administration in time range)  dextrose 5 % in lactated ringers infusion (has no administration in time range)  dextrose 50 % solution 0-50 mL (has no administration in time range)  sodium chloride 0.9 % bolus 500 mL (500 mLs Intravenous New Bag/Given 12/02/20  1936)      Note:  This document was prepared using Dragon voice recognition software and may include unintentional dictation errors.  Alona Bene, MD, [facility] Emergency Medicine    Torre Pikus, Arlyss Repress, MD 12/02/20 [phone]

## 2020-12-02 NOTE — H&P (Signed)
History and Physical   Valerie Le NGE:[id number] DOB: 1971/03/03 DOA: 12/02/2020  Referring MD/NP/PA: Dr. Alona Bene  PCP: Maud Deed, [place]   Outpatient Specialists: None  Patient coming from: [facility] [place]  Chief Complaint: Right foot swelling  HPI: Valerie Le is a 50 y.o. female with medical history significant of insulin-dependent diabetes on insulin pump who is out of her pump and will not be able to get it till July 1, essential hypertension, hyperlipidemia, morbid obesity who presents to med [facility] with wound at the top of her right foot.  This started in December and has been on and off.  She went to the [facility] wound center where they have been trying to manage it.  It started as a small ulceration at the top.  She has noted worsening swelling of the foot redness rise all the way to the ankle today.  She has not been on antibiotics lately.  The symptoms got worse suddenly in the last 2 days.  Her blood sugar was noted to be in the 500s.  Patient reported not being able to feel her insulin pump.  She is not in DKA but appears to have hyperosmolar hyperglycemia.  With the wound appears to have significant cellulitis.  Patient admitted for IV antibiotics and blood sugar control..  ED Course: Temperature 98.4, blood pressure 135/80, pulse 116, respiratory 27 oxygen status 96% on room air.  Sodium 130, potassium 3.5 chloride 92 CO2 28 glucose is 509 BUN 13 creatinine 0.75 calcium 8.5.  Lactic acid is 1.1.  White count 15.1 hemoglobin 11.3 platelets 243.  COVID-19 screen negative.  Blood cultures were obtained.  X-ray of the foot showed no fracture or dislocation no evidence of osteomyelitis on x-ray.  Patient admitted with diabetic foot infection and also as well as hypoglycemia.  Review of Systems: As per HPI otherwise 10 point review of systems negative.    Past Medical History:  Diagnosis Date   Diabetes mellitus without complication  (HCC)    High cholesterol    Hypertension     Past Surgical History:  Procedure Laterality Date   TONSILLECTOMY     TYMPANOSTOMY TUBE PLACEMENT       reports that she has been smoking cigarettes. She has never used smokeless tobacco. She reports that she does not drink alcohol and does not use drugs.  Allergies  Allergen Reactions   Coconut Oil Anaphylaxis   Pineapple Anaphylaxis   Tomato Anaphylaxis   Latex Hives   Metformin And Related Other (See Comments)    Constipation/cramps    History reviewed. No pertinent family history.   Prior to Admission medications   Medication Sig Start Date End Date Taking? Authorizing Provider  ibuprofen (ADVIL,MOTRIN) 200 MG tablet Take 600 mg by mouth every 6 (six) hours as needed for mild pain.    [provider]  Insulin Human (INSULIN PUMP) SOLN Inject into the skin. Novolog 100 units/ml    [provider]  metoCLOPramide (REGLAN) 10 MG tablet Take 10 mg by mouth every 6 (six) hours as needed for nausea.    [provider]  SUMAtriptan (IMITREX) 25 MG tablet Take 25 mg by mouth every 2 (two) hours as needed for migraine. May repeat in 2 hours if headache persists or recurs.    [provider]    Physical Exam: Vitals:   12/02/20 2115 12/02/20 2134 12/02/20 2145 12/02/20 2254  BP: 135/80 122/73 114/67 128/73  Pulse:  100 100 100 (!) 101  Resp: (!) 22 17 18  (!) 27  Temp:    98.1 F (36.7 C)  TempSrc:    Oral  SpO2: 96% 99% 98% 99%  Weight:    98.9 kg  Height:    5\' 11"  (1.803 m)      Constitutional: Obese, ill looking Vitals:   12/02/20 2115 12/02/20 2134 12/02/20 2145 12/02/20 2254  BP: 135/80 122/73 114/67 128/73  Pulse: 100 100 100 (!) 101  Resp: (!) 22 17 18  (!) 27  Temp:    98.1 F (36.7 C)  TempSrc:    Oral  SpO2: 96% 99% 98% 99%  Weight:    98.9 kg  Height:    5\' 11"  (1.803 m)   Eyes: PERRL, lids and conjunctivae normal ENMT: Mucous membranes are dry. Posterior pharynx clear  of any exudate or lesions.Normal dentition.  Neck: normal, supple, no masses, no thyromegaly Respiratory: clear to auscultation bilaterally, no wheezing, no crackles. Normal respiratory effort. No accessory muscle use.  Cardiovascular: Sinus tachycardia, no murmurs / rubs / gallops. No extremity edema. 2+ pedal pulses. No carotid bruits.  Abdomen: no tenderness, no masses palpated. No hepatosplenomegaly. Bowel sounds positive.  Musculoskeletal: no clubbing / cyanosis. No joint deformity upper and lower extremities. Good ROM, no contractures. Normal muscle tone.  Right foot swollen, and open wound above the second toe with disfigurement, redness of the entire foot up to the ankle Skin: no rashes, lesions, ulcers. No induration Neurologic: CN 2-12 grossly intact. Sensation intact, DTR normal. Strength 5/5 in all 4.  Psychiatric: Normal judgment and insight. Alert and oriented x 3. Normal mood.     Labs on Admission: I have personally reviewed following labs and imaging studies  CBC: Recent Labs  Lab 12/02/20 1906  WBC 15.0*  NEUTROABS 11.6*  HGB 11.3*  HCT 34.6*  MCV 74.1*  PLT 249   Basic Metabolic Panel: Recent Labs  Lab 12/02/20 1906  NA 130*  K 3.5  CL 92*  CO2 28  GLUCOSE 509*  BUN 13  CREATININE 0.75  CALCIUM 8.5*   GFR: Estimated Creatinine Clearance: 108.9 mL/min (by C-G formula based on SCr of 0.75 mg/dL). Liver Function Tests: Recent Labs  Lab 12/02/20 1906  AST 10*  ALT 13  ALKPHOS 116  BILITOT 0.5  PROT 7.8  ALBUMIN 2.9*   No results for input(s): LIPASE, AMYLASE in the last 168 hours. No results for input(s): AMMONIA in the last 168 hours. Coagulation Profile: No results for input(s): INR, PROTIME in the last 168 hours. Cardiac Enzymes: No results for input(s): CKTOTAL, CKMB, CKMBINDEX, TROPONINI in the last 168 hours. BNP (last 3 results) No results for input(s): PROBNP in the last 8760 hours. HbA1C: No results for input(s): HGBA1C in the last  72 hours. CBG: Recent Labs  Lab 12/02/20 1709 12/02/20 2135 12/02/20 2244  GLUCAP 488* 410* 326*   Lipid Profile: No results for input(s): CHOL, HDL, LDLCALC, TRIG, CHOLHDL, LDLDIRECT in the last 72 hours. Thyroid Function Tests: No results for input(s): TSH, T4TOTAL, FREET4, T3FREE, THYROIDAB in the last 72 hours. Anemia Panel: No results for input(s): VITAMINB12, FOLATE, FERRITIN, TIBC, IRON, RETICCTPCT in the last 72 hours. Urine analysis: No results found for: COLORURINE, APPEARANCEUR, LABSPEC, PHURINE, GLUCOSEU, HGBUR, BILIRUBINUR, KETONESUR, PROTEINUR, UROBILINOGEN, NITRITE, LEUKOCYTESUR Sepsis Labs: @LABRCNTIP (procalcitonin:4,lacticidven:4) ) Recent Results (from the past 240 hour(s))  Resp Panel by RT-PCR (Flu A&B, Covid) Nasopharyngeal Swab     Status: None   Collection Time: 12/02/20  7:06 PM   Specimen: Nasopharyngeal Swab; Nasopharyngeal(NP) swabs in vial transport medium  Result Value Ref Range Status   SARS Coronavirus 2 by RT PCR NEGATIVE NEGATIVE Final    Comment: (NOTE) SARS-CoV-2 target nucleic acids are NOT DETECTED.  The SARS-CoV-2 RNA is generally detectable in upper respiratory specimens during the acute phase of infection. The lowest concentration of SARS-CoV-2 viral copies this assay can detect is 138 copies/mL. A negative result does not preclude SARS-Cov-2 infection and should not be used as the sole basis for treatment or other patient management decisions. A negative result may occur with  improper specimen collection/handling, submission of specimen other than nasopharyngeal swab, presence of viral mutation(s) within the areas targeted by this assay, and inadequate number of viral copies(<138 copies/mL). A negative result must be combined with clinical observations, patient history, and epidemiological information. The expected result is Negative.  Fact Sheet for Patients:  BloggerCourse.comhttps://www.fda.gov/media/152166/download  Fact Sheet for Healthcare  Providers:  SeriousBroker.ithttps://www.fda.gov/media/152162/download  This test is no t yet approved or cleared by the [place] FDA and  has been authorized for detection and/or diagnosis of SARS-CoV-2 by FDA under an Emergency Use Authorization (EUA). This EUA will remain  in effect (meaning this test can be used) for the duration of the COVID-19 declaration under Section 564(b)(1) of the Act, 21 U.S.C.section 360bbb-3(b)(1), unless the authorization is terminated  or revoked sooner.       Influenza A by PCR NEGATIVE NEGATIVE Final   Influenza B by PCR NEGATIVE NEGATIVE Final    Comment: (NOTE) The Xpert Xpress SARS-CoV-2/FLU/RSV plus assay is intended as an aid in the diagnosis of influenza from Nasopharyngeal swab specimens and should not be used as a sole basis for treatment. Nasal washings and aspirates are unacceptable for Xpert Xpress SARS-CoV-2/FLU/RSV testing.  Fact Sheet for Patients: BloggerCourse.comhttps://www.fda.gov/media/152166/download  Fact Sheet for Healthcare Providers: SeriousBroker.ithttps://www.fda.gov/media/152162/download  This test is not yet approved or cleared by the [place] FDA and has been authorized for detection and/or diagnosis of SARS-CoV-2 by FDA under an Emergency Use Authorization (EUA). This EUA will remain in effect (meaning this test can be used) for the duration of the COVID-19 declaration under Section 564(b)(1) of the Act, 21 U.S.C. section 360bbb-3(b)(1), unless the authorization is terminated or revoked.  Performed at [facility] [place], [address] [place], [place] [id number]      Radiological Exams on Admission: DG Foot Complete Right  Result Date: 12/02/2020 CLINICAL DATA:  Foot ulcer.  Hyperglycemia EXAM: RIGHT FOOT COMPLETE - 3+ VIEW COMPARISON:  None. FINDINGS: No evidence of acute fracture or dislocation in the right foot. No focal bone lesion or cortical changes to suggest osteomyelitis. Old ununited ossicles at the distal fibula, calcaneal cuboidal  region, and navicular region. Mild dorsal soft tissue swelling with soft tissue gas demonstrated over the dorsum of the forefoot. This suggests cellulitis with gas-forming organism. No radiopaque soft tissue foreign bodies. IMPRESSION: No acute fracture or dislocation. Soft tissue swelling with soft tissue gas consistent with cellulitis. No radiographic changes of osteomyelitis. Electronically Signed   By: Burman NievesWilliam  Stevens M.D.   On: 12/02/2020 18:02      Assessment/Plan Principal Problem:   Diabetic foot infection (HCC) Active Problems:   Essential hypertension   Insulin-treated type 2 diabetes mellitus (HCC)   Asthma   ADHD (attention deficit hyperactivity disorder)   Bipolar affective disorder, currently depressed, moderate (HCC)   Morbid obesity due to excess calories (HCC)   Migraine   Tobacco abuse  Hyperlipidemia associated with type 2 diabetes mellitus (HCC)     #1 diabetic foot infection: Patient will be admitted and be treated for polymicrobial infection.  IV vancomycin and cefepime initiated.  Follow blood culture results.  May need an MRI to be sure no osteomyelitis ultimately.  Podiatry consult may also be considered prior to discharge.  In the meantime wound care may be consulted again.  #2 uncontrolled diabetes with hyperosmolar hyperglycemia: Most likely due to patient missing her insulin refills.  She may need to do subcutaneous insulin until her pump can be refilled.  In the meantime she is on insulin drip.  Continue with insulin drip until she is titrated to subcu insulin.  #3 essential hypertension: Patient is on amlodipine and lisinopril.  We will continue  #4 ADHD: Not on treatment right now.  Continue to monitor  #5 hyperlipidemia: On Lipitor 80 mg.  Continue  #6 morbid obesity: Dietary counseling  #7 bipolar disorder: Not on any treatment at home.  Monitor only in the hospital.  #8 tobacco abuse: Counseling with nicotine patch  #9 history of migraine  headaches: On as needed Imitrex.  Continue   DVT prophylaxis: Lovenox Code Status: Full code Family Communication: No family at bedside Disposition Plan: Home Consults called: None Admission status: Inpatient  Severity of Illness: The appropriate patient status for this patient is INPATIENT. Inpatient status is judged to be reasonable and necessary in order to provide the required intensity of service to ensure the patient's safety. The patient's presenting symptoms, physical exam findings, and initial radiographic and laboratory data in the context of their chronic comorbidities is felt to place them at high risk for further clinical deterioration. Furthermore, it is not anticipated that the patient will be medically stable for discharge from the hospital within 2 midnights of admission. The following factors support the patient status of inpatient.   " The patient's presenting symptoms include right foot wound. " The worrisome physical exam findings include swollen right foot with obvious wound and Cellulitis " The initial radiographic and laboratory data are worrisome because of no evidence of osteomyelitis. " The chronic co-morbidities include insulin-dependent diabetes.   * I certify that at the point of admission it is my clinical judgment that the patient will require inpatient hospital care spanning beyond 2 midnights from the point of admission due to high intensity of service, high risk for further deterioration and high frequency of surveillance required.Lonia Blood MD [facility] Pager [phone]  If 7PM-7AM, please contact night-coverage [email or web address] Password [facility]  12/02/2020, 11:06 PM

## 2020-12-03 ENCOUNTER — Inpatient Hospital Stay (HOSPITAL_COMMUNITY): Payer: Medicaid Other

## 2020-12-03 LAB — CBC
HCT: 29.1 % — ABNORMAL LOW (ref 36.0–46.0)
HCT: 30.7 % — ABNORMAL LOW (ref 36.0–46.0)
Hemoglobin: 9.5 g/dL — ABNORMAL LOW (ref 12.0–15.0)
Hemoglobin: 9.9 g/dL — ABNORMAL LOW (ref 12.0–15.0)
MCH: 24.4 pg — ABNORMAL LOW (ref 26.0–34.0)
MCH: 24.8 pg — ABNORMAL LOW (ref 26.0–34.0)
MCHC: 32.2 g/dL (ref 30.0–36.0)
MCHC: 32.6 g/dL (ref 30.0–36.0)
MCV: 75.6 fL — ABNORMAL LOW (ref 80.0–100.0)
MCV: 76 fL — ABNORMAL LOW (ref 80.0–100.0)
Platelets: 235 10*3/uL (ref 150–400)
Platelets: 246 10*3/uL (ref 150–400)
RBC: 3.83 MIL/uL — ABNORMAL LOW (ref 3.87–5.11)
RBC: 4.06 MIL/uL (ref 3.87–5.11)
RDW: 16 % — ABNORMAL HIGH (ref 11.5–15.5)
RDW: 16.1 % — ABNORMAL HIGH (ref 11.5–15.5)
WBC: 13.5 10*3/uL — ABNORMAL HIGH (ref 4.0–10.5)
WBC: 13.7 10*3/uL — ABNORMAL HIGH (ref 4.0–10.5)
nRBC: 0 % (ref 0.0–0.2)
nRBC: 0 % (ref 0.0–0.2)

## 2020-12-03 LAB — BASIC METABOLIC PANEL
Anion gap: 10 (ref 5–15)
Anion gap: 4 — ABNORMAL LOW (ref 5–15)
Anion gap: 8 (ref 5–15)
Anion gap: 9 (ref 5–15)
BUN: 10 mg/dL (ref 6–20)
BUN: 11 mg/dL (ref 6–20)
BUN: 11 mg/dL (ref 6–20)
BUN: 11 mg/dL (ref 6–20)
CO2: 24 mmol/L (ref 22–32)
CO2: 27 mmol/L (ref 22–32)
CO2: 27 mmol/L (ref 22–32)
CO2: 28 mmol/L (ref 22–32)
Calcium: 8 mg/dL — ABNORMAL LOW (ref 8.9–10.3)
Calcium: 8.3 mg/dL — ABNORMAL LOW (ref 8.9–10.3)
Calcium: 8.4 mg/dL — ABNORMAL LOW (ref 8.9–10.3)
Calcium: 8.4 mg/dL — ABNORMAL LOW (ref 8.9–10.3)
Chloride: 101 mmol/L (ref 98–111)
Chloride: 103 mmol/L (ref 98–111)
Chloride: 103 mmol/L (ref 98–111)
Chloride: 105 mmol/L (ref 98–111)
Creatinine, Ser: 0.56 mg/dL (ref 0.44–1.00)
Creatinine, Ser: 0.71 mg/dL (ref 0.44–1.00)
Creatinine, Ser: 0.8 mg/dL (ref 0.44–1.00)
Creatinine, Ser: 0.82 mg/dL (ref 0.44–1.00)
GFR, Estimated: >60 mL/min (ref >60)
GFR, Estimated: >60 mL/min (ref >60)
GFR, Estimated: >60 mL/min (ref >60)
GFR, Estimated: >60 mL/min (ref >60)
Glucose, Bld: 121 mg/dL — ABNORMAL HIGH (ref 70–99)
Glucose, Bld: 137 mg/dL — ABNORMAL HIGH (ref 70–99)
Glucose, Bld: 166 mg/dL — ABNORMAL HIGH (ref 70–99)
Glucose, Bld: 239 mg/dL — ABNORMAL HIGH (ref 70–99)
Potassium: 3.3 mmol/L — ABNORMAL LOW (ref 3.5–5.1)
Potassium: 3.4 mmol/L — ABNORMAL LOW (ref 3.5–5.1)
Potassium: 4 mmol/L (ref 3.5–5.1)
Potassium: 4.2 mmol/L (ref 3.5–5.1)
Sodium: 137 mmol/L (ref 135–145)
Sodium: 137 mmol/L (ref 135–145)
Sodium: 137 mmol/L (ref 135–145)
Sodium: 138 mmol/L (ref 135–145)

## 2020-12-03 LAB — GLUCOSE, CAPILLARY
Glucose-Capillary: 108 mg/dL — ABNORMAL HIGH (ref 70–99)
Glucose-Capillary: 121 mg/dL — ABNORMAL HIGH (ref 70–99)
Glucose-Capillary: 130 mg/dL — ABNORMAL HIGH (ref 70–99)
Glucose-Capillary: 136 mg/dL — ABNORMAL HIGH (ref 70–99)
Glucose-Capillary: 137 mg/dL — ABNORMAL HIGH (ref 70–99)
Glucose-Capillary: 142 mg/dL — ABNORMAL HIGH (ref 70–99)
Glucose-Capillary: 168 mg/dL — ABNORMAL HIGH (ref 70–99)
Glucose-Capillary: 170 mg/dL — ABNORMAL HIGH (ref 70–99)
Glucose-Capillary: 191 mg/dL — ABNORMAL HIGH (ref 70–99)
Glucose-Capillary: 204 mg/dL — ABNORMAL HIGH (ref 70–99)
Glucose-Capillary: 219 mg/dL — ABNORMAL HIGH (ref 70–99)
Glucose-Capillary: 228 mg/dL — ABNORMAL HIGH (ref 70–99)

## 2020-12-03 LAB — COMPREHENSIVE METABOLIC PANEL
ALT: 12 U/L (ref 0–44)
AST: 9 U/L — ABNORMAL LOW (ref 15–41)
Albumin: 2.6 g/dL — ABNORMAL LOW (ref 3.5–5.0)
Alkaline Phosphatase: 99 U/L (ref 38–126)
Anion gap: 10 (ref 5–15)
BUN: 11 mg/dL (ref 6–20)
CO2: 27 mmol/L (ref 22–32)
Calcium: 8.4 mg/dL — ABNORMAL LOW (ref 8.9–10.3)
Chloride: 100 mmol/L (ref 98–111)
Creatinine, Ser: 0.74 mg/dL (ref 0.44–1.00)
GFR, Estimated: >60 mL/min (ref >60)
Glucose, Bld: 236 mg/dL — ABNORMAL HIGH (ref 70–99)
Potassium: 3.3 mmol/L — ABNORMAL LOW (ref 3.5–5.1)
Sodium: 137 mmol/L (ref 135–145)
Total Bilirubin: 0.3 mg/dL (ref 0.3–1.2)
Total Protein: 6.8 g/dL (ref 6.5–8.1)

## 2020-12-03 LAB — HIV ANTIBODY (ROUTINE TESTING W REFLEX): HIV Screen 4th Generation wRfx: NONREACTIVE

## 2020-12-03 LAB — HEMOGLOBIN A1C
Hgb A1c MFr Bld: 13.1 % — ABNORMAL HIGH (ref 4.8–5.6)
Mean Plasma Glucose: 329 mg/dL

## 2020-12-03 LAB — MRSA PCR SCREENING: MRSA by PCR: NEGATIVE

## 2020-12-03 IMAGING — MR MR FOOT*R* W/O CM
5 of 6 series · 38 of 40 positions shown · non-contrast
Comparison: X-ray [DATE]

CLINICAL DATA: Acute foot pain.  Infection suspected

EXAM:
MRI OF THE RIGHT FOREFOOT WITHOUT CONTRAST
TECHNIQUE: Multiplanar, multisequence MR imaging of the right forefoot was
performed. No intravenous contrast was administered.

[Series 3: T1 · coronal · right · 3.0mm · 0.47mm/px · 10 of 46 slices shown (1 of 2)]
[im 1/46]
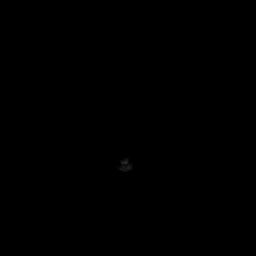
[im 6/46]
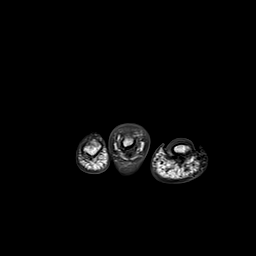
[im 11/46]
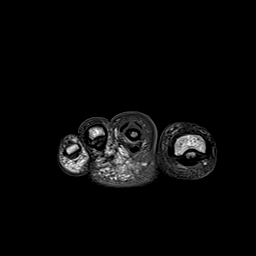
[im 16/46]
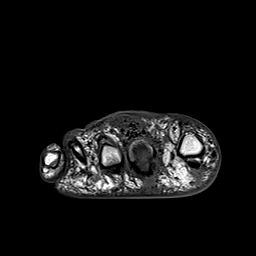
[im 21/46]
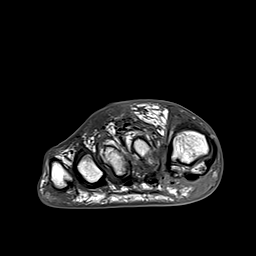
[im 26/46]
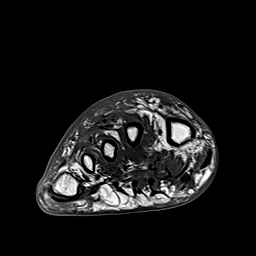
[im 31/46]
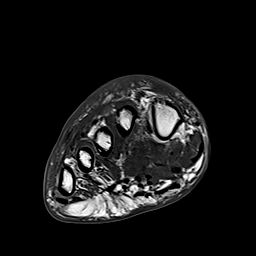
[im 36/46]
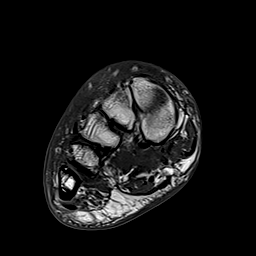
[im 41/46]
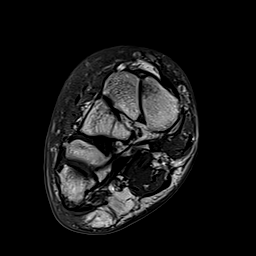
[im 46/46]
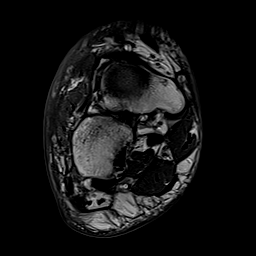

[Series 4: T2 fat-sat · coronal · right · 3.0mm · 0.38mm/px · 10 of 46 slices shown (1 of 2)]
[im 1/46]
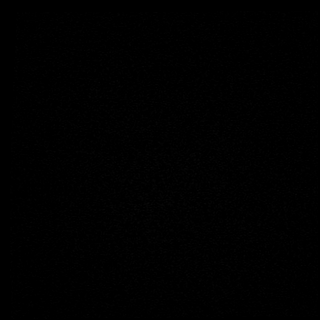
[im 6/46]
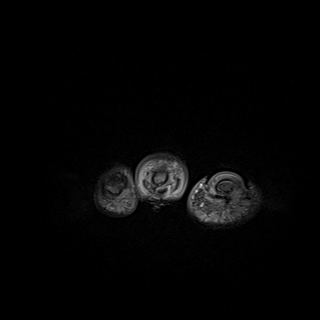
[im 11/46]
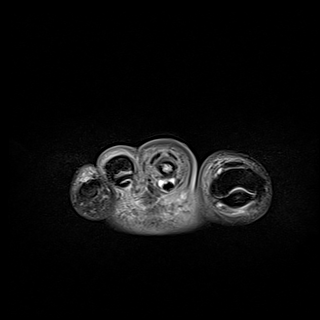
[im 16/46]
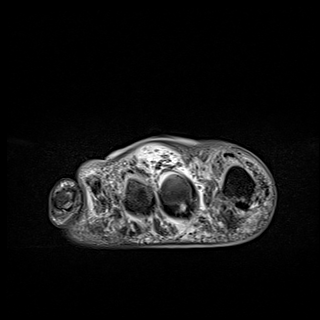
[im 21/46]
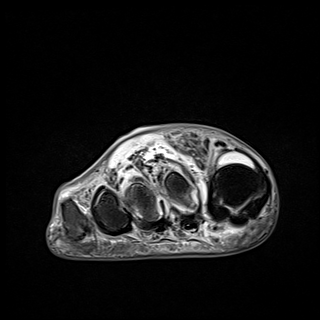
[im 26/46]
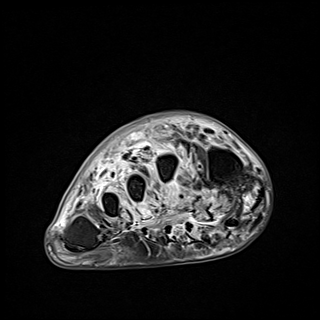
[im 31/46]
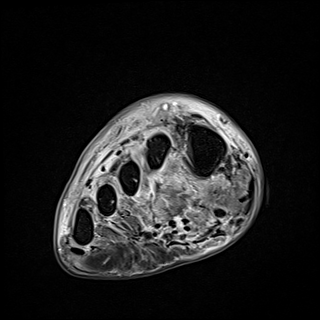
[im 36/46]
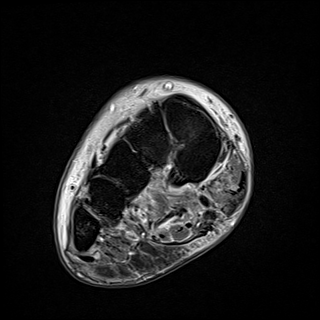
[im 41/46]
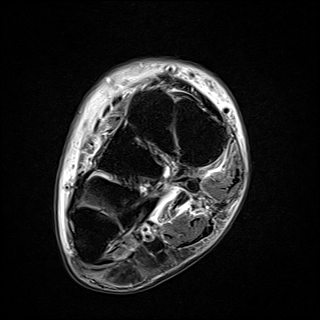
[im 46/46]
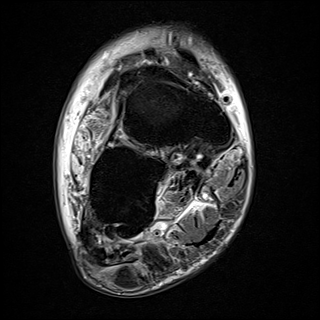

[Series 5: T2 fat-sat · axial · right · 3.0mm · 0.70mm/px · z∈[-81,+12]mm · 6 of 30 slices shown (2 of 2)]
[im 1/30]
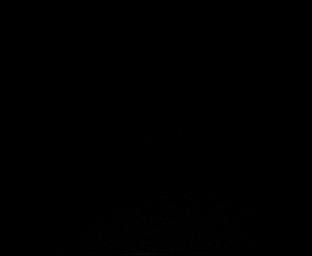
[im 6/30]
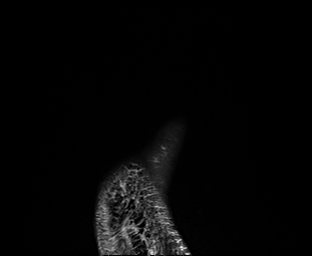
[im 12/30]
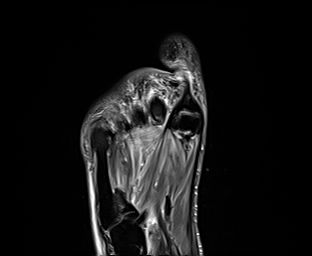
[im 18/30]
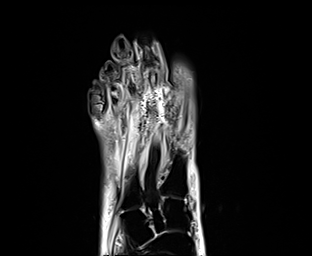
[im 24/30]
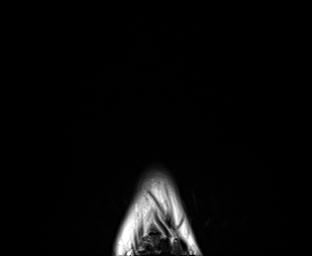
[im 30/30]
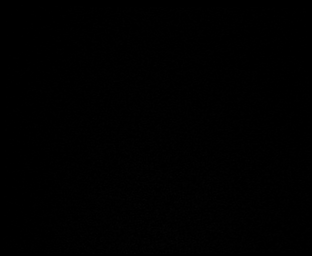

[Series 6: T1 · axial · right · 3.0mm · 0.70mm/px · z∈[-81,+12]mm · 6 of 30 slices shown (2 of 2)]
[im 1/30]
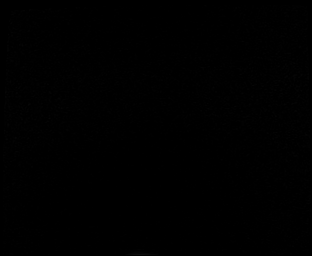
[im 6/30]
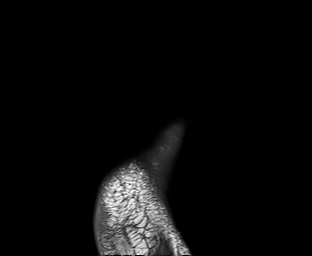
[im 12/30]
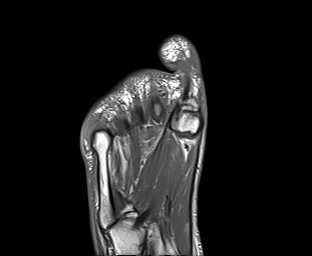
[im 18/30]
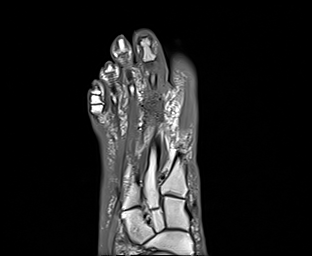
[im 24/30]
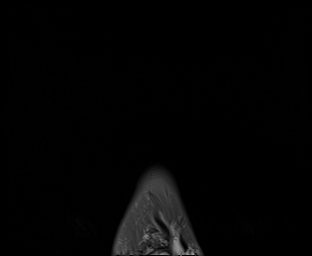
[im 30/30]
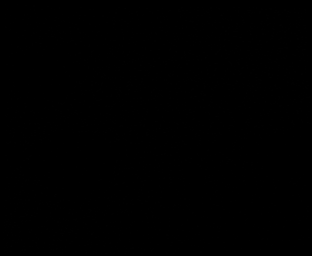

[Series 7: STIR · sagittal · right · 3.0mm · 0.35mm/px · 6 of 27 slices shown]
[im 1/27]
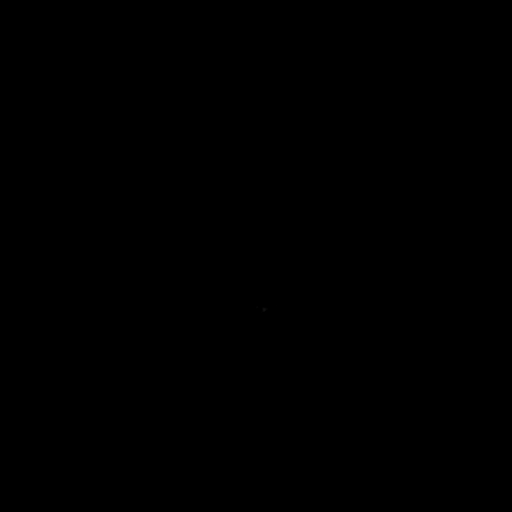
[im 6/27]
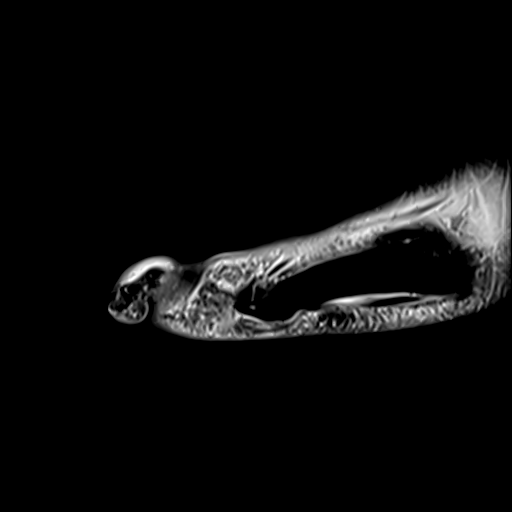
[im 11/27]
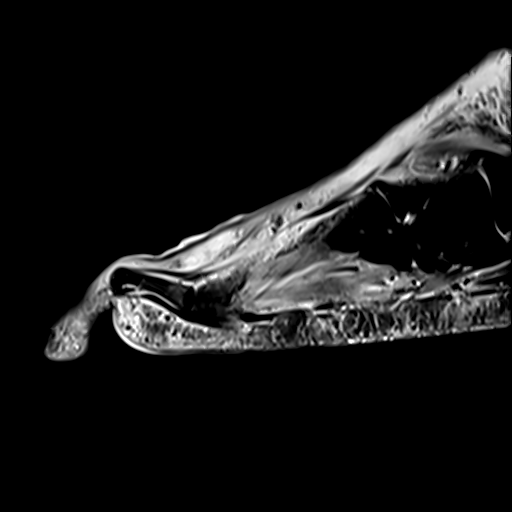
[im 16/27]
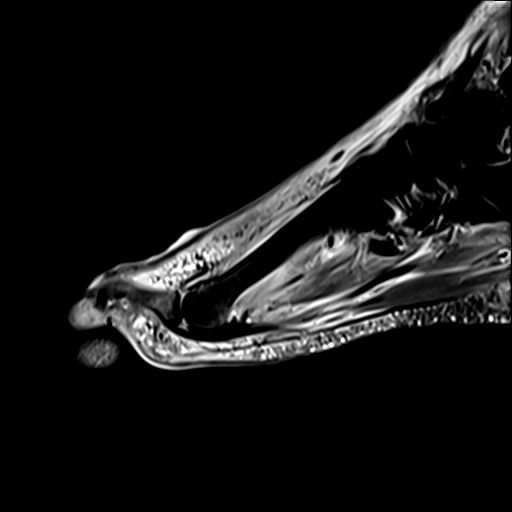
[im 21/27]
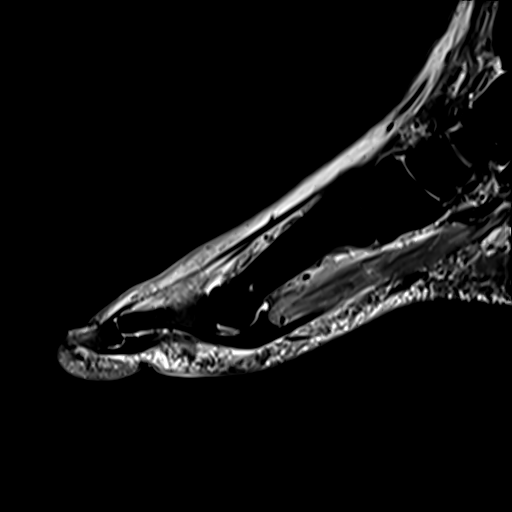
[im 27/27]
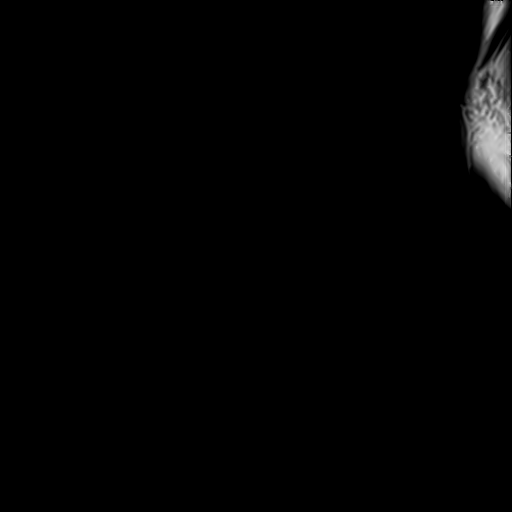

[38 of 40 positions shown; findings below may reference images not displayed]

FINDINGS: Bones/Joint/Cartilage

Bone marrow edema throughout the proximal phalanx of the right
second toe with intermediate T1 marrow signal compatible with acute
osteomyelitis (series 6, images 11-13). Bone marrow edema within the
middle phalanx of the second toe with preservation of the fatty T1
bone marrow signal. Preserved signal within the second metatarsal
head.

Remaining osseous structures are within normal limits. No fracture
or dislocation. Small joint effusion at the first MTP joint,
nonspecific.

Ligaments

Intact Lisfranc ligament. Collateral ligaments of the forefoot
appear intact.

Muscles and Tendons

Diffuse intramuscular edema suggesting myositis.

Soft tissues

Fluid and air containing collection predominantly located within the
dorsal soft tissues of the forefoot extending to the base of the
second toe measuring approximately 7.7 x 1.7 x 2.9 cm (series 5,
image 11; series 4, image 19). Fluid collection extends into the
first intermetatarsal space (series 4, image 20). Diffuse
subcutaneous edema throughout the foot. Small collections of fluid
noted within the cutaneous aspect of the dorsal forefoot suggest
sites of skin blistering.
IMPRESSION: 1. Acute osteomyelitis of the proximal phalanx of the right second
toe.
2. Bone marrow edema within the middle phalanx of the second toe may
represent reactive osteitis versus early acute osteomyelitis.
3. Fluid and air containing collection predominantly located within
the dorsal soft tissues of the forefoot extending to the base of the
second toe measuring up to 7.7 x 1.7 x 2.9 cm compatible with
abscess. Fluid collection extends into the first intermetatarsal
space.
4. Small first MTP joint effusion which may be reactive or represent
septic arthritis.
5. Diffuse intramuscular edema suggesting myositis.

## 2020-12-03 MED ORDER — POTASSIUM CHLORIDE CRYS ER 20 MEQ PO TBCR
40.0000 meq | EXTENDED_RELEASE_TABLET | Freq: Once | ORAL | Status: AC
  Administered 2020-12-03: 40 meq via ORAL
  Filled 2020-12-03: qty 2

## 2020-12-03 MED ORDER — LISINOPRIL 20 MG PO TABS
40.0000 mg | ORAL_TABLET | Freq: Every day | ORAL | Status: DC
  Administered 2020-12-03 – 2020-12-09 (×6): 40 mg via ORAL
  Filled 2020-12-03: qty 4
  Filled 2020-12-03 (×5): qty 2

## 2020-12-03 MED ORDER — METOCLOPRAMIDE HCL 10 MG PO TABS: 10.0000 mg | ORAL_TABLET | Freq: Four times a day (QID) | ORAL | Status: DC | PRN

## 2020-12-03 MED ORDER — INSULIN ASPART 100 UNIT/ML IJ SOLN
0.0000 [IU] | Freq: Three times a day (TID) | INTRAMUSCULAR | Status: DC
  Administered 2020-12-03: 3 [IU] via SUBCUTANEOUS
  Administered 2020-12-03: 2 [IU] via SUBCUTANEOUS
  Administered 2020-12-04: 8 [IU] via SUBCUTANEOUS
  Administered 2020-12-04: 5 [IU] via SUBCUTANEOUS
  Administered 2020-12-04: 2 [IU] via SUBCUTANEOUS
  Administered 2020-12-05: 3 [IU] via SUBCUTANEOUS
  Administered 2020-12-05 (×2): 5 [IU] via SUBCUTANEOUS
  Administered 2020-12-06: 3 [IU] via SUBCUTANEOUS
  Administered 2020-12-06 (×2): 8 [IU] via SUBCUTANEOUS
  Administered 2020-12-07: 5 [IU] via SUBCUTANEOUS
  Administered 2020-12-07: 11 [IU] via SUBCUTANEOUS
  Administered 2020-12-07: 5 [IU] via SUBCUTANEOUS
  Administered 2020-12-08 (×2): 8 [IU] via SUBCUTANEOUS
  Administered 2020-12-08: 11 [IU] via SUBCUTANEOUS
  Administered 2020-12-09 (×2): 3 [IU] via SUBCUTANEOUS
  Administered 2020-12-09: 8 [IU] via SUBCUTANEOUS

## 2020-12-03 MED ORDER — SUMATRIPTAN SUCCINATE 25 MG PO TABS
25.0000 mg | ORAL_TABLET | ORAL | Status: DC | PRN
  Administered 2020-12-08: 25 mg via ORAL
  Filled 2020-12-03 (×3): qty 1

## 2020-12-03 MED ORDER — ATORVASTATIN CALCIUM 40 MG PO TABS
80.0000 mg | ORAL_TABLET | Freq: Every day | ORAL | Status: DC
  Administered 2020-12-03 – 2020-12-08 (×6): 80 mg via ORAL
  Filled 2020-12-03 (×6): qty 2

## 2020-12-03 MED ORDER — SODIUM CHLORIDE 0.9 % IV SOLN: INTRAVENOUS | Status: DC

## 2020-12-03 MED ORDER — ASPIRIN EC 81 MG PO TBEC
81.0000 mg | DELAYED_RELEASE_TABLET | Freq: Every day | ORAL | Status: DC
  Administered 2020-12-03 – 2020-12-09 (×7): 81 mg via ORAL
  Filled 2020-12-03 (×7): qty 1

## 2020-12-03 MED ORDER — VITAMIN D 25 MCG (1000 UNIT) PO TABS
1000.0000 [IU] | ORAL_TABLET | Freq: Every day | ORAL | Status: DC
  Administered 2020-12-03 – 2020-12-09 (×7): 1000 [IU] via ORAL
  Filled 2020-12-03 (×7): qty 1

## 2020-12-03 MED ORDER — AMLODIPINE BESYLATE 10 MG PO TABS
10.0000 mg | ORAL_TABLET | Freq: Every day | ORAL | Status: DC
  Administered 2020-12-03 – 2020-12-09 (×6): 10 mg via ORAL
  Filled 2020-12-03 (×6): qty 1

## 2020-12-03 MED ORDER — INSULIN ASPART 100 UNIT/ML IJ SOLN
0.0000 [IU] | Freq: Every day | INTRAMUSCULAR | Status: DC
  Administered 2020-12-03 – 2020-12-07 (×4): 2 [IU] via SUBCUTANEOUS

## 2020-12-03 MED ORDER — SODIUM CHLORIDE 0.9 % IV SOLN
INTRAVENOUS | Status: DC | PRN
  Administered 2020-12-03: 250 mL via INTRAVENOUS

## 2020-12-03 MED ORDER — INSULIN GLARGINE 100 UNIT/ML ~~LOC~~ SOLN
20.0000 [IU] | Freq: Every day | SUBCUTANEOUS | Status: DC
  Administered 2020-12-03 – 2020-12-04 (×2): 20 [IU] via SUBCUTANEOUS
  Filled 2020-12-03 (×2): qty 0.2

## 2020-12-03 MED ORDER — POTASSIUM CHLORIDE 10 MEQ/100ML IV SOLN
10.0000 meq | INTRAVENOUS | Status: AC
  Administered 2020-12-03 (×2): 10 meq via INTRAVENOUS
  Filled 2020-12-03 (×2): qty 100

## 2020-12-03 MED ORDER — INSULIN ASPART 100 UNIT/ML IJ SOLN
4.0000 [IU] | Freq: Three times a day (TID) | INTRAMUSCULAR | Status: DC
  Administered 2020-12-03 – 2020-12-05 (×7): 4 [IU] via SUBCUTANEOUS

## 2020-12-03 MED ORDER — IBUPROFEN 400 MG PO TABS
600.0000 mg | ORAL_TABLET | Freq: Four times a day (QID) | ORAL | Status: DC | PRN
  Administered 2020-12-03 – 2020-12-04 (×2): 600 mg via ORAL
  Filled 2020-12-03: qty 3
  Filled 2020-12-03: qty 1

## 2020-12-03 MED ORDER — LISINOPRIL 10 MG PO TABS: 40.0000 mg | ORAL_TABLET | Freq: Every day | ORAL | Status: DC

## 2020-12-03 NOTE — Progress Notes (Addendum)
PROGRESS NOTE    Valerie Le  ZOX:[id number] DOB: 05-11-1971 DOA: 12/02/2020 PCP: [name], [name], PA   Brief Narrative: 50 year old with past medical history significant for insulin-dependent diabetes on insulin pump, run out of insulin in her pump, unable to get it until July 1, history of hypertension, morbid obesity who [facility] complaining of worsening wound of her right foot.  [facility], her wound is started in December, has a small ulceration on the top of her foot.  Worsening swelling of her foot and ankle today of admission.  Her blood sugars are recommended to be in the 500 range.  Evaluation in the ED: Patient was noted to be tachycardic heart rate 116 tachypneic respiration rate 27.  Sodium 130, potassium 3.5 CO2 28, glucose 509.  Lactic acid 1.1, white blood cell 15.  X-ray of the right foot showed no fracture or dislocation,  swelling with soft tissue gas consistent with cellulitis.   Assessment & Plan:   Principal Problem:   Diabetic foot infection (HCC) Active Problems:   Essential hypertension   Insulin-treated type 2 diabetes mellitus (HCC)   Asthma   ADHD (attention deficit hyperactivity disorder)   Bipolar affective disorder, currently depressed, moderate (HCC)   Morbid obesity due to excess calories (HCC)   Migraine   Tobacco abuse   Hyperlipidemia associated with type 2 diabetes mellitus (HCC)   1-Hyperosmolar, hyperglycemic state -Patient presented with hyperglycemia blood sugar 500, anion gap normal, CO2 28. -He was treated with insulin drip and IV fluids. -This morning CBG 126 range.  Plan to transition to Lantus and a sliding scale insulin. -  2-Diabetic foot infection: -Continue with IV vancomycin and cefepime -X-Ray Consistent with cellulitis. -Ortho consulted.  -MRI right foot ordered.   3-Hypertension: Continue with norvasc.  Resume lisinopril.   4-Hyperlipidemia: Continue with  lipitor.   5-Morbid obesity: Needs life style modification.  Anemia; check anemia panel.   Tobacco abuse: counseled.   History of migraine: On Sumatriptan.      Estimated body mass index is 30.4 kg/m as calculated from the following:   Height as of this encounter: 5\' 11"  (1.803 m).   Weight as of this encounter: 98.9 kg.   DVT prophylaxis: Lovenox Code Status: Full code Family Communication: care discussed with patient.  Disposition Plan:  Status is: Inpatient  Remains inpatient appropriate because:Hemodynamically unstable  Dispo: The patient is from: Home              Anticipated d/c is to: Home              Patient currently is not medically stable to d/c.   Difficult to place patient No        Consultants:  Dr Ophelia CharterYates  Procedures:  None  Antimicrobials:    Subjective: She is alert, report worsening swelling and edema last few days. Chronic ulcer wound third toe  Objective: Vitals:   12/03/20 0000 12/03/20 0200 12/03/20 0400 12/03/20 0600  BP: 129/78 120/63 140/77 120/69  Pulse: 98 100 92 91  Resp: 17 (!) 26 (!) 24 20  Temp:   98.3 F (36.8 C)   TempSrc:   Oral   SpO2: 98% 95% 94% 95%  Weight:      Height:        Intake/Output Summary (Last 24 hours) at 12/03/2020 0711 Last data filed at 12/03/2020 0200 Gross per 24 hour  Intake 1027.09 ml  Output --  Net 1027.09  ml   Filed Weights   12/02/20 1708 12/02/20 2254  Weight: 100.2 kg 98.9 kg    Examination:  General exam: Appears calm and comfortable  Respiratory system: Clear to auscultation. Respiratory effort normal. Cardiovascular system: S1 & S2 heard, RRR. No JVD, murmurs, rubs, gallops or clicks. No pedal edema. Gastrointestinal system: Abdomen is nondistended, soft and nontender. No organomegaly or masses felt. Normal bowel sounds heard. Central nervous system: Alert and oriented. No focal neurological deficits. Extremities: Symmetric 5 x 5 power. Right foot with edema, redness, 3rd  toe with ulceration, enlarge in size.    Data Reviewed: I have personally reviewed following labs and imaging studies  CBC: Recent Labs  Lab 12/02/20 1906 12/02/20 2347 12/03/20 0031  WBC 15.0* 13.5* 13.7*  NEUTROABS 11.6*  --   --   HGB 11.3* 9.9* 9.5*  HCT 34.6* 30.7* 29.1*  MCV 74.1* 75.6* 76.0*  PLT 249 235 246   Basic Metabolic Panel: Recent Labs  Lab 12/02/20 1906 12/02/20 2347 12/03/20 0031  NA 130* 137 137  K 3.5 3.3* 3.3*  CL 92* 101 100  CO2 28 27 27   GLUCOSE 509* 239* 236*  BUN 13 11 11   CREATININE 0.75 0.71 0.74  CALCIUM 8.5* 8.4* 8.4*   GFR: Estimated Creatinine Clearance: 108.9 mL/min (by C-G formula based on SCr of 0.74 mg/dL). Liver Function Tests: Recent Labs  Lab 12/02/20 1906 12/03/20 0031  AST 10* 9*  ALT 13 12  ALKPHOS 116 99  BILITOT 0.5 0.3  PROT 7.8 6.8  ALBUMIN 2.9* 2.6*   No results for input(s): LIPASE, AMYLASE in the last 168 hours. No results for input(s): AMMONIA in the last 168 hours. Coagulation Profile: No results for input(s): INR, PROTIME in the last 168 hours. Cardiac Enzymes: No results for input(s): CKTOTAL, CKMB, CKMBINDEX, TROPONINI in the last 168 hours. BNP (last 3 results) No results for input(s): PROBNP in the last 8760 hours. HbA1C: No results for input(s): HGBA1C in the last 72 hours. CBG: Recent Labs  Lab 12/03/20 0248 12/03/20 0349 12/03/20 0441 12/03/20 0600 12/03/20 0655  GLUCAP 204* 170* 142* 121* 136*   Lipid Profile: No results for input(s): CHOL, HDL, LDLCALC, TRIG, CHOLHDL, LDLDIRECT in the last 72 hours. Thyroid Function Tests: No results for input(s): TSH, T4TOTAL, FREET4, T3FREE, THYROIDAB in the last 72 hours. Anemia Panel: No results for input(s): VITAMINB12, FOLATE, FERRITIN, TIBC, IRON, RETICCTPCT in the last 72 hours. Sepsis Labs: Recent Labs  Lab 12/02/20 1906 12/02/20 2041  LATICACIDVEN 1.1 1.1    Recent Results (from the past 240 hour(s))  Resp Panel by RT-PCR (Flu A&B,  Covid) Nasopharyngeal Swab     Status: None   Collection Time: 12/02/20  7:06 PM   Specimen: Nasopharyngeal Swab; Nasopharyngeal(NP) swabs in vial transport medium  Result Value Ref Range Status   SARS Coronavirus 2 by RT PCR NEGATIVE NEGATIVE Final    Comment: (NOTE) SARS-CoV-2 target nucleic acids are NOT DETECTED.  The SARS-CoV-2 RNA is generally detectable in upper respiratory specimens during the acute phase of infection. The lowest concentration of SARS-CoV-2 viral copies this assay can detect is 138 copies/mL. A negative result does not preclude SARS-Cov-2 infection and should not be used as the sole basis for treatment or other patient management decisions. A negative result may occur with  improper specimen collection/handling, submission of specimen other than nasopharyngeal swab, presence of viral mutation(s) within the areas targeted by this assay, and inadequate number of viral copies(<138 copies/mL). A negative result  must be combined with clinical observations, patient history, and epidemiological information. The expected result is Negative.  Fact Sheet for Patients:  BloggerCourse.com  Fact Sheet for Healthcare Providers:  SeriousBroker.it  This test is no t yet approved or cleared by the [place] FDA and  has been authorized for detection and/or diagnosis of SARS-CoV-2 by FDA under an Emergency Use Authorization (EUA). This EUA will remain  in effect (meaning this test can be used) for the duration of the COVID-19 declaration under Section 564(b)(1) of the Act, 21 U.S.C.section 360bbb-3(b)(1), unless the authorization is terminated  or revoked sooner.       Influenza A by PCR NEGATIVE NEGATIVE Final   Influenza B by PCR NEGATIVE NEGATIVE Final    Comment: (NOTE) The Xpert Xpress SARS-CoV-2/FLU/RSV plus assay is intended as an aid in the diagnosis of influenza from Nasopharyngeal swab specimens and should  not be used as a sole basis for treatment. Nasal washings and aspirates are unacceptable for Xpert Xpress SARS-CoV-2/FLU/RSV testing.  Fact Sheet for Patients: BloggerCourse.com  Fact Sheet for Healthcare Providers: SeriousBroker.it  This test is not yet approved or cleared by the [place] FDA and has been authorized for detection and/or diagnosis of SARS-CoV-2 by FDA under an Emergency Use Authorization (EUA). This EUA will remain in effect (meaning this test can be used) for the duration of the COVID-19 declaration under Section 564(b)(1) of the Act, 21 U.S.C. section 360bbb-3(b)(1), unless the authorization is terminated or revoked.  Performed at [facility], [address] [place], [place] [address]   MRSA PCR Screening     Status: None   Collection Time: 12/02/20 10:49 PM  Result Value Ref Range Status   MRSA by PCR NEGATIVE NEGATIVE Final    Comment:        The GeneXpert MRSA Assay (FDA approved for NASAL specimens only), is one component of a comprehensive MRSA colonization surveillance program. It is not intended to diagnose MRSA infection nor to guide or monitor treatment for MRSA infections. Performed at [facility], [address], [place], [place] [address]          Radiology Studies: DG Foot Complete Right  Result Date: 12/02/2020 CLINICAL DATA:  Foot ulcer.  Hyperglycemia EXAM: RIGHT FOOT COMPLETE - 3+ VIEW COMPARISON:  None. FINDINGS: No evidence of acute fracture or dislocation in the right foot. No focal bone lesion or cortical changes to suggest osteomyelitis. 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 Mild dorsal soft tissue swelling with soft tissue gas demonstrated over the dorsum of the forefoot. This suggests cellulitis with gas-forming organism. No radiopaque soft tissue foreign bodies. IMPRESSION: No acute  fracture or dislocation. Soft tissue swelling with soft tissue gas consistent with cellulitis. No radiographic changes of osteomyelitis. Electronically Signed   By: Burman Nieves M.D.   On: 12/02/2020 18:02        Scheduled Meds:  amLODipine  10 mg Oral Daily   aspirin EC  81 mg Oral Daily   atorvastatin  80 mg Oral QHS   Chlorhexidine Gluconate Cloth  6 each Topical Daily   cholecalciferol  1,000 Units Oral Daily   enoxaparin (LOVENOX) injection  40 mg Subcutaneous Q24H   insulin aspart  0-15 Units Subcutaneous TID WC   insulin aspart  0-5 Units Subcutaneous QHS   insulin aspart  4 Units Subcutaneous TID WC   insulin glargine  20 Units Subcutaneous Daily   lisinopril  40 mg Oral Daily   potassium chloride  40 mEq Oral Once   Continuous Infusions:  ceFEPime (MAXIPIME) IV Stopped (12/03/20 0034)   dextrose 5% lactated ringers Stopped (12/03/20 0004)   insulin 3.2 Units/hr (12/03/20 0349)   lactated ringers Stopped (12/03/20 0000)   potassium chloride     vancomycin Stopped (12/03/20 0558)     LOS: 1 day    Time spent: 35 minutes.     Alba Cory, MD [facility]   If 7PM-7AM, please contact night-coverage [email or web address]  12/03/2020, 7:11 AM

## 2020-12-03 NOTE — Progress Notes (Signed)
Handbook given to patient.  

## 2020-12-03 NOTE — Progress Notes (Signed)
Chaplain stepped into room during rounding.  Patient working on her computer.   Chaplain offered support and presence.  Patient says she's "just trying to receive what's being offered to her in the way of healing."  Chaplain said she was offering prayer for patient.  Chaplain is available if needed. Rev. Lynnell Chad Pager [phone]

## 2020-12-03 NOTE — Plan of Care (Signed)
  Problem: Activity: Goal: Risk for activity intolerance will decrease Outcome: Progressing   Problem: Nutrition: Goal: Adequate nutrition will be maintained Outcome: Progressing   Problem: Coping: Goal: Level of anxiety will decrease Outcome: Progressing   Problem: Pain Managment: Goal: General experience of comfort will improve Outcome: Progressing   

## 2020-12-03 NOTE — Progress Notes (Signed)
Inpatient Diabetes Program Recommendations  AACE/ADA: New Consensus Statement on Inpatient Glycemic Control (2015)  Target Ranges:  Prepandial:   less than 140 mg/dL      Peak postprandial:   less than 180 mg/dL (1-2 hours)      Critically ill patients:  140 - 180 mg/dL   Lab Results  Component Value Date   GLUCAP 137 (H) 12/03/2020    Review of Glycemic Control  Diabetes history: DM type 1, Sees Dr. Shawnee Knapp, Endocrinologist Outpatient Diabetes medications: T Slim insulin pump/ Dexcom G6 (supplies come in starting July 1st) Toujeo 48 units qhs, Humalog 12-42 units tid (counts carbs for meal time) 1 unit for every 6 gram of carbs Current orders for Inpatient glycemic control:  Lantus 20 units Daily Novolog 0-15 units tid + hs Novolog 4 units tid meal coverage   Last visit with Endocrinologist was in April. 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  Spoke with pt at bedside regarding importance of glucose control at home. Pt reports glucose trends have been between 200-300 range at home at baseline. Encouraged pt to closely follow up with D. Shawnee Knapp calling him every week for insulin adjustments at home for optimal glucose control in the 150 range.  Will closely follow glucose trends while here. May need to increase insulin closer to home dose if trends start to increase.   Thanks,  Christena Deem RN, MSN, BC-ADM Inpatient Diabetes Coordinator Team Pager [phone] (8a-5p)

## 2020-12-03 NOTE — Plan of Care (Signed)
  Problem: Education: Goal: Knowledge of General Education information will improve Description Including pain rating scale, medication(s)/side effects and non-pharmacologic comfort measures Outcome: Progressing   

## 2020-12-03 NOTE — TOC Initial Note (Signed)
Transition of Care [facility]) - Initial/Assessment Note    Patient Details  Name: Valerie Le MRN: [id number] Date of Birth: 08-05-1970  Transition of [facility] CM/SW Contact:    Golda Acre, RN Phone Number: 12/03/2020, 7:47 AM  Clinical Narrative:                 50 y.o. female with medical history significant of insulin-dependent diabetes on insulin pump who is out of her pump and will not be able to get it till July 1, essential hypertension, hyperlipidemia, morbid obesity who presents to med [facility] with wound at the top of her right foot.  This started in December and has been on and off.  She went to the [facility] wound center where they have been trying to manage it.  It started as a small ulceration at the top.  She has noted worsening swelling of the foot redness rise all the way to the ankle today.  She has not been on antibiotics lately.  The symptoms got worse suddenly in the last 2 days.  Her blood sugar was noted to be in the 500s.  Patient reported not being able to feel her insulin pump.  She is not in DKA but appears to have hyperosmolar hyperglycemia.  With the wound appears to have significant cellulitis.  Patient admitted for IV antibiotics and blood sugar control.Marland Kitchen PLAN: top return to home with self care, may need hhc due to foot ulcer Wbc 13.7,hgb-9.5, bld glucose 236, anion gap is closed . Iv abx and iv d5lr and lr at 125cc/hr each. Will follow for progression and hhc needs.  Has a wife and lives at home. Expected Discharge Plan: Home w Home Health Services Barriers to Discharge: Continued Medical Work up   Patient Goals and CMS Choice Patient states their goals for this hospitalization and ongoing recovery are:: to go home CMS Medicare.gov Compare Post Acute Care list provided to:: Patient    Expected Discharge Plan and Services Expected Discharge Plan: Home w Home Health Services   Discharge Planning Services: CM Consult   Living arrangements  for the past 2 months: Apartment                                      Prior Living Arrangements/Services Living arrangements for the past 2 months: Apartment Lives with:: Spouse Patient language and need for interpreter reviewed:: Yes Do you feel safe going back to the place where you live?: Yes      Need for Family Participation in Patient Care: No (Comment) Care giver support system in place?: No (comment)   Criminal Activity/Legal Involvement Pertinent to Current Situation/Hospitalization: No - Comment as needed  Activities of Daily Living      Permission Sought/Granted                  Emotional Assessment Appearance:: Appears stated age Attitude/Demeanor/Rapport: Engaged Affect (typically observed): Calm Orientation: : Oriented to Place, Oriented to Self, Oriented to  Time, Oriented to Situation Alcohol / Substance Use: Not Applicable Psych Involvement: No (comment)  Admission diagnosis:  Hyperglycemia [R73.9] Cellulitis of right lower extremity [L03.115] Diabetic foot infection (HCC) [W23.762, L08.9] Patient Active Problem List   Diagnosis Date Noted   Diabetic foot infection (HCC) 12/02/2020   Essential hypertension 12/02/2020   Hyperlipidemia associated with type 2 diabetes mellitus (HCC) 08/21/2018   Bipolar affective disorder, currently depressed,  moderate (HCC) 01/23/2017   Morbid obesity due to excess calories (HCC) 02/15/2016   Insulin-treated type 2 diabetes mellitus (HCC) 03/07/2013   Asthma 03/07/2013   ADHD (attention deficit hyperactivity disorder) 03/07/2013   Migraine 03/07/2013   Tobacco abuse 03/07/2013   PCP:  Maud Deed, PA Pharmacy:   [facility] [phone] [place], [place] - [address] [place] [address] Phone: [phone] Fax: [phone]     Social Determinants of Health (SDOH) Interventions    Readmission Risk Interventions No flowsheet data found.

## 2020-12-04 ENCOUNTER — Inpatient Hospital Stay (HOSPITAL_COMMUNITY): Payer: Medicaid Other | Admitting: Certified Registered Nurse Anesthetist

## 2020-12-04 ENCOUNTER — Encounter (HOSPITAL_COMMUNITY)
Admission: EM | Disposition: A | Discharge status: Home Health | Payer: Self-pay | Source: Home / Self Care | Attending: Internal Medicine

## 2020-12-04 DIAGNOSIS — L089 Local infection of the skin and subcutaneous tissue, unspecified: Secondary | ICD-10-CM

## 2020-12-04 DIAGNOSIS — E11628 Type 2 diabetes mellitus with other skin complications: Secondary | ICD-10-CM

## 2020-12-04 HISTORY — PX: AMPUTATION TOE: SHX6595

## 2020-12-04 LAB — BASIC METABOLIC PANEL
Anion gap: 6 (ref 5–15)
BUN: 10 mg/dL (ref 6–20)
CO2: 25 mmol/L (ref 22–32)
Calcium: 8.3 mg/dL — ABNORMAL LOW (ref 8.9–10.3)
Chloride: 105 mmol/L (ref 98–111)
Creatinine, Ser: 0.59 mg/dL (ref 0.44–1.00)
GFR, Estimated: >60 mL/min (ref >60)
Glucose, Bld: 271 mg/dL — ABNORMAL HIGH (ref 70–99)
Potassium: 4.3 mmol/L (ref 3.5–5.1)
Sodium: 136 mmol/L (ref 135–145)

## 2020-12-04 LAB — GLUCOSE, CAPILLARY
Glucose-Capillary: 260 mg/dL — ABNORMAL HIGH (ref 70–99)
Glucose-Capillary: 213 mg/dL — ABNORMAL HIGH (ref 70–99)
Glucose-Capillary: 95 mg/dL (ref 70–99)
Glucose-Capillary: 127 mg/dL — ABNORMAL HIGH (ref 70–99)
Glucose-Capillary: 131 mg/dL — ABNORMAL HIGH (ref 70–99)

## 2020-12-04 LAB — RETICULOCYTES
Immature Retic Fract: 21.1 % — ABNORMAL HIGH (ref 2.3–15.9)
RBC.: 4.05 MIL/uL (ref 3.87–5.11)
Retic Count, Absolute: 29.6 10*3/uL (ref 19.0–186.0)
Retic Ct Pct: 0.7 % (ref 0.4–3.1)

## 2020-12-04 LAB — CBC
HCT: 31.6 % — ABNORMAL LOW (ref 36.0–46.0)
Hemoglobin: 10.1 g/dL — ABNORMAL LOW (ref 12.0–15.0)
MCH: 24.3 pg — ABNORMAL LOW (ref 26.0–34.0)
MCHC: 32 g/dL (ref 30.0–36.0)
MCV: 76.1 fL — ABNORMAL LOW (ref 80.0–100.0)
Platelets: 274 10*3/uL (ref 150–400)
RBC: 4.15 MIL/uL (ref 3.87–5.11)
RDW: 16.2 % — ABNORMAL HIGH (ref 11.5–15.5)
WBC: 10.3 10*3/uL (ref 4.0–10.5)
nRBC: 0 % (ref 0.0–0.2)

## 2020-12-04 LAB — IRON AND TIBC
Iron: 15 ug/dL — ABNORMAL LOW (ref 28–170)
Saturation Ratios: 7 % — ABNORMAL LOW (ref 10.4–31.8)
TIBC: 209 ug/dL — ABNORMAL LOW (ref 250–450)
UIBC: 194 ug/dL

## 2020-12-04 LAB — FOLATE: Folate: 9.3 ng/mL (ref >5.9)

## 2020-12-04 LAB — VITAMIN B12: Vitamin B-12: 335 pg/mL (ref 180–914)

## 2020-12-04 LAB — FERRITIN: Ferritin: 196 ng/mL (ref 11–307)

## 2020-12-04 SURGERY — AMPUTATION, TOE
Anesthesia: General | Site: Toe | Laterality: Right

## 2020-12-04 MED ORDER — PROPOFOL 10 MG/ML IV BOLUS
INTRAVENOUS | Status: AC
  Filled 2020-12-04: qty 20

## 2020-12-04 MED ORDER — LACTATED RINGERS IV SOLN: INTRAVENOUS | Status: DC

## 2020-12-04 MED ORDER — MIDAZOLAM HCL 2 MG/2ML IJ SOLN
INTRAMUSCULAR | Status: AC
  Filled 2020-12-04: qty 2

## 2020-12-04 MED ORDER — OXYCODONE-ACETAMINOPHEN 5-325 MG PO TABS
1.0000 | ORAL_TABLET | ORAL | Status: DC | PRN
  Administered 2020-12-04: 2 via ORAL
  Administered 2020-12-04: 1 via ORAL
  Administered 2020-12-05 – 2020-12-08 (×6): 2 via ORAL
  Filled 2020-12-04 (×2): qty 2
  Filled 2020-12-04: qty 1
  Filled 2020-12-04 (×6): qty 2

## 2020-12-04 MED ORDER — OXYCODONE HCL 5 MG/5ML PO SOLN: 5.0000 mg | Freq: Once | ORAL | Status: DC | PRN

## 2020-12-04 MED ORDER — ONDANSETRON HCL 4 MG/2ML IJ SOLN
INTRAMUSCULAR | Status: AC
  Filled 2020-12-04: qty 2

## 2020-12-04 MED ORDER — FENTANYL CITRATE (PF) 100 MCG/2ML IJ SOLN: 25.0000 ug | INTRAMUSCULAR | Status: DC | PRN

## 2020-12-04 MED ORDER — ACETAMINOPHEN 160 MG/5ML PO SOLN: 325.0000 mg | ORAL | Status: DC | PRN

## 2020-12-04 MED ORDER — OXYCODONE HCL 5 MG PO TABS
5.0000 mg | ORAL_TABLET | Freq: Once | ORAL | Status: AC | PRN
  Administered 2020-12-04: 5 mg via ORAL
  Filled 2020-12-04: qty 1

## 2020-12-04 MED ORDER — FENTANYL CITRATE (PF) 100 MCG/2ML IJ SOLN
INTRAMUSCULAR | Status: AC
  Filled 2020-12-04: qty 2

## 2020-12-04 MED ORDER — MIDAZOLAM HCL 5 MG/5ML IJ SOLN
INTRAMUSCULAR | Status: DC | PRN
  Administered 2020-12-04: 2 mg via INTRAVENOUS

## 2020-12-04 MED ORDER — ONDANSETRON HCL 4 MG/2ML IJ SOLN
INTRAMUSCULAR | Status: DC | PRN
  Administered 2020-12-04: 4 mg via INTRAVENOUS

## 2020-12-04 MED ORDER — PROPOFOL 10 MG/ML IV BOLUS
INTRAVENOUS | Status: DC | PRN
  Administered 2020-12-04: 170 mg via INTRAVENOUS

## 2020-12-04 MED ORDER — BUPIVACAINE HCL (PF) 0.5 % IJ SOLN
INTRAMUSCULAR | Status: AC
  Filled 2020-12-04: qty 30

## 2020-12-04 MED ORDER — LIDOCAINE 2% (20 MG/ML) 5 ML SYRINGE
INTRAMUSCULAR | Status: AC
  Filled 2020-12-04: qty 5

## 2020-12-04 MED ORDER — ACETAMINOPHEN 325 MG PO TABS: 325.0000 mg | ORAL_TABLET | ORAL | Status: DC | PRN

## 2020-12-04 MED ORDER — PHENYLEPHRINE 40 MCG/ML (10ML) SYRINGE FOR IV PUSH (FOR BLOOD PRESSURE SUPPORT)
PREFILLED_SYRINGE | INTRAVENOUS | Status: DC | PRN
  Administered 2020-12-04: 80 ug via INTRAVENOUS

## 2020-12-04 MED ORDER — ONDANSETRON HCL 4 MG/2ML IJ SOLN
4.0000 mg | Freq: Once | INTRAMUSCULAR | Status: DC | PRN
  Filled 2020-12-04 (×3): qty 2

## 2020-12-04 MED ORDER — INSULIN GLARGINE 100 UNIT/ML ~~LOC~~ SOLN
25.0000 [IU] | Freq: Every day | SUBCUTANEOUS | Status: DC
  Administered 2020-12-05 – 2020-12-06 (×2): 25 [IU] via SUBCUTANEOUS
  Filled 2020-12-04 (×2): qty 0.25

## 2020-12-04 MED ORDER — FENTANYL CITRATE (PF) 100 MCG/2ML IJ SOLN
INTRAMUSCULAR | Status: DC | PRN
  Administered 2020-12-04: 50 ug via INTRAVENOUS

## 2020-12-04 MED ORDER — LIDOCAINE 2% (20 MG/ML) 5 ML SYRINGE
INTRAMUSCULAR | Status: DC | PRN
  Administered 2020-12-04: 80 mg via INTRAVENOUS

## 2020-12-04 MED ORDER — LACTATED RINGERS IV SOLN: INTRAVENOUS | Status: DC | PRN

## 2020-12-04 MED ORDER — 0.9 % SODIUM CHLORIDE (POUR BTL) OPTIME
TOPICAL | Status: DC | PRN
  Administered 2020-12-04: 1000 mL

## 2020-12-04 MED ORDER — MEPERIDINE HCL 50 MG/ML IJ SOLN: 6.2500 mg | INTRAMUSCULAR | Status: DC | PRN

## 2020-12-04 SURGICAL SUPPLY — 42 items
BAG COUNTER SPONGE SURGICOUNT (BAG) IMPLANT
BANDAGE ESMARK 6X9 LF (GAUZE/BANDAGES/DRESSINGS) IMPLANT
BNDG ELASTIC 3X5.8 VLCR STR LF (GAUZE/BANDAGES/DRESSINGS) ×1 IMPLANT
BNDG ELASTIC 4X5.8 VLCR STR LF (GAUZE/BANDAGES/DRESSINGS) IMPLANT
BNDG ESMARK 6X9 LF (GAUZE/BANDAGES/DRESSINGS)
BNDG GAUZE ELAST 4 BULKY (GAUZE/BANDAGES/DRESSINGS) IMPLANT
CANISTER SUCT 3000ML PPV (MISCELLANEOUS) ×1 IMPLANT
CHLORAPREP W/TINT 26 (MISCELLANEOUS) ×2 IMPLANT
COVER SURGICAL LIGHT HANDLE (MISCELLANEOUS) ×2 IMPLANT
CUFF TOURN SGL QUICK 18X4 (TOURNIQUET CUFF) ×1 IMPLANT
CUFF TOURN SGL QUICK 24 (TOURNIQUET CUFF)
CUFF TOURN SGL QUICK 34 (TOURNIQUET CUFF)
CUFF TRNQT CYL 24X4X16.5-23 (TOURNIQUET CUFF) IMPLANT
CUFF TRNQT CYL 34X4.125X (TOURNIQUET CUFF) IMPLANT
DRAIN PENROSE 0.5X18 (DRAIN) IMPLANT
DRAPE INCISE IOBAN 66X45 STRL (DRAPES) ×1 IMPLANT
DRAPE U-SHAPE 47X51 STRL (DRAPES) ×2 IMPLANT
DRSG PAD ABDOMINAL 8X10 ST (GAUZE/BANDAGES/DRESSINGS) ×4 IMPLANT
DRSG VAC ATS SM SENSATRAC (GAUZE/BANDAGES/DRESSINGS) ×1 IMPLANT
ELECT REM PT RETURN 15FT ADLT (MISCELLANEOUS) ×2 IMPLANT
EVACUATOR 1/8 PVC DRAIN (DRAIN) IMPLANT
GAUZE SPONGE 4X4 12PLY STRL (GAUZE/BANDAGES/DRESSINGS) IMPLANT
GAUZE XEROFORM 5X9 LF (GAUZE/BANDAGES/DRESSINGS) IMPLANT
GLOVE SRG 8 PF TXTR STRL LF DI (GLOVE) ×1 IMPLANT
GLOVE SURG ORTHO LTX SZ7.5 (GLOVE) ×2 IMPLANT
GLOVE SURG UNDER POLY LF SZ8 (GLOVE) ×1
GOWN STRL REUS W/TWL LRG LVL3 (GOWN DISPOSABLE) ×2 IMPLANT
GOWN STRL REUS W/TWL XL LVL3 (GOWN DISPOSABLE) ×2 IMPLANT
HANDPIECE INTERPULSE COAX TIP (DISPOSABLE)
KIT BASIN OR (CUSTOM PROCEDURE TRAY) ×2 IMPLANT
KIT TURNOVER KIT A (KITS) ×2 IMPLANT
PACK ORTHO EXTREMITY (CUSTOM PROCEDURE TRAY) ×2 IMPLANT
PAD CAST 4YDX4 CTTN HI CHSV (CAST SUPPLIES) IMPLANT
PADDING CAST COTTON 4X4 STRL (CAST SUPPLIES)
PENCIL SMOKE EVACUATOR (MISCELLANEOUS) IMPLANT
PROTECTOR NERVE ULNAR (MISCELLANEOUS) IMPLANT
SET HNDPC FAN SPRY TIP SCT (DISPOSABLE) IMPLANT
SPONGE T-LAP 18X18 ~~LOC~~+RFID (SPONGE) IMPLANT
SUT ETHILON 2 0 PS N (SUTURE) ×1 IMPLANT
SYR CONTROL 10ML LL (SYRINGE) IMPLANT
TOWEL OR 17X26 10 PK STRL BLUE (TOWEL DISPOSABLE) ×2 IMPLANT
TOWEL OR NON WOVEN STRL DISP B (DISPOSABLE) ×2 IMPLANT

## 2020-12-04 NOTE — Anesthesia Postprocedure Evaluation (Signed)
Anesthesia Post Note  Patient: Valerie Le  Procedure(s) Performed: AMPUTATION RIGHT SECOND TOE, I&D FOOT (Right: Toe)     Patient location during evaluation: PACU Anesthesia Type: General Level of consciousness: awake and alert Pain management: pain level controlled Vital Signs Assessment: post-procedure vital signs reviewed and stable Respiratory status: spontaneous breathing, nonlabored ventilation, respiratory function stable and patient connected to nasal cannula oxygen Cardiovascular status: blood pressure returned to baseline and stable Postop Assessment: no apparent nausea or vomiting Anesthetic complications: no   No notable events documented.  Last Vitals:  Vitals:   12/04/20 1637 12/04/20 1814  BP: (!) 142/86 133/82  Pulse: 86 95  Resp: 18 20  Temp: 37.1 C 37 C  SpO2: 100% 92%    Last Pain:  Vitals:   12/04/20 1814  TempSrc: Oral  PainSc:                  Vesta Wheeland

## 2020-12-04 NOTE — Progress Notes (Signed)
PROGRESS NOTE    Valerie Le  YQM:[id number] DOB: 05-Jun-1971 DOA: 12/02/2020 PCP: Maud Deed, PA   Brief Narrative: 50 year old with past medical history significant for insulin-dependent diabetes on insulin pump, run out of insulin in her pump, unable to get it until July 1, history of hypertension, morbid obesity who presents to med [facility] complaining of worsening wound of her right foot.  Patient follows at [facility] wound center, her wound is started in December, has a small ulceration on the top of her foot.  Worsening swelling of her foot and ankle today of admission.  Her blood sugars are recommended to be in the 500 range.  Evaluation in the ED: Patient was noted to be tachycardic heart rate 116 tachypneic respiration rate 27.  Sodium 130, potassium 3.5 CO2 28, glucose 509.  Lactic acid 1.1, white blood cell 15.  X-ray of the right foot showed no fracture or dislocation,  swelling with soft tissue gas consistent with cellulitis.   Assessment & Plan:   Principal Problem:   Diabetic foot infection (HCC) Active Problems:   Essential hypertension   Insulin-treated type 2 diabetes mellitus (HCC)   Asthma   ADHD (attention deficit hyperactivity disorder)   Bipolar affective disorder, currently depressed, moderate (HCC)   Morbid obesity due to excess calories (HCC)   Migraine   Tobacco abuse   Hyperlipidemia associated with type 2 diabetes mellitus (HCC)   1-Hyperosmolar, hyperglycemic state -Patient presented with hyperglycemia blood sugar 500, anion gap normal, CO2 28. -He was treated with insulin drip and IV fluids. -She was transition to Lantus and a sliding scale insulin. -Increase lantus to 25 units.   2-Diabetic foot infection Osteomyelitis of the proximal phalanx of the right second toe.  -Continue with IV vancomycin and cefepime. -X-Ray Consistent with cellulitis. -MRI right foot; Acute osteomyelitis of the proximal phalanx of the right second  toe. Bone marrow edema within the middle phalanx of the second toe may represent reactive osteitis versus early acute osteomyelitis. Fluid and air containing collection predominantly located within the dorsal soft tissues of the forefoot extending to the base of the second toe measuring up to 7.7 x 1.7 x 2.9 cm compatible with abscess. Fluid collection extends into the first intermetatarsal space. Small first MTP joint effusion which may be reactive or represent septic arthritis. Diffuse intramuscular edema suggesting myositis. Plan for OR today, toe amputation and drainage of abscess.  Dr Ophelia Charter consulted.   3-Hypertension: Continue with Norvasc and  lisinopril.   4-Hyperlipidemia: Continue with lipitor.   5-Morbid obesity: Needs life style modification.  Anemia; iron deficiency. Start iron when infection is controlled.   Tobacco abuse: counseled.   History of migraine: On Sumatriptan.      Estimated body mass index is 30.4 kg/m as calculated from the following:   Height as of this encounter: 5\' 11"  (1.803 m).   Weight as of this encounter: 98.9 kg.   DVT prophylaxis: Lovenox Code Status: Full code Family Communication: care discussed with patient.  Disposition Plan:  Status is: Inpatient  Remains inpatient appropriate because:Hemodynamically unstable  Dispo: The patient is from: Home              Anticipated d/c is to: Home              Patient currently is not medically stable to d/c.   Difficult to place patient No        Consultants:  Dr  Procedures:  None  Antimicrobials:  Subjective: She spoke with Dr Ophelia CharterYates, about surgery.  She denies dyspnea.   Objective: Vitals:   12/04/20 0051 12/04/20 0511 12/04/20 0512 12/04/20 1304  BP: 132/78 138/82  135/85  Pulse: 91 99  87  Resp: 16 18  17   Temp: 98.4 F (36.9 C) 98.7 F (37.1 C)  98.4 F (36.9 C)  TempSrc: Oral Oral  Oral  SpO2: 99% (!) 89% 93% 96%  Weight:      Height:         Intake/Output Summary (Last 24 hours) at 12/04/2020 1404 Last data filed at 12/04/2020 0930 Gross per 24 hour  Intake 2378.72 ml  Output --  Net 2378.72 ml    Filed Weights   12/02/20 1708 12/02/20 2254  Weight: 100.2 kg 98.9 kg    Examination:  General exam: NAD Respiratory system: CTA Cardiovascular system: S 1, S 2 RRR Gastrointestinal system: BS present, soft, nt Central nervous system: alert  Extremities: Symmetric 5 x 5 power. Right foot with edema, redness, 3rd toe with ulceration, enlarge in size.    Data Reviewed: I have personally reviewed following labs and imaging studies  CBC: Recent Labs  Lab 12/02/20 1906 12/02/20 2347 12/03/20 0031 12/04/20 0801  WBC 15.0* 13.5* 13.7* 10.3  NEUTROABS 11.6*  --   --   --   HGB 11.3* 9.9* 9.5* 10.1*  HCT 34.6* 30.7* 29.1* 31.6*  MCV 74.1* 75.6* 76.0* 76.1*  PLT 249 235 246 274    Basic Metabolic Panel: Recent Labs  Lab 12/03/20 0031 12/03/20 0729 12/03/20 1120 12/03/20 1502 12/04/20 0801  NA 137 137 138 137 136  K 3.3* 3.4* 4.0 4.2 4.3  CL 100 105 103 103 105  CO2 27 28 27 24 25   GLUCOSE 236* 137* 121* 166* 271*  BUN 11 11 11 10 10   CREATININE 0.74 0.82 0.56 0.80 0.59  CALCIUM 8.4* 8.0* 8.4* 8.3* 8.3*    GFR: Estimated Creatinine Clearance: 108.9 mL/min (by C-G formula based on SCr of 0.59 mg/dL). Liver Function Tests: Recent Labs  Lab 12/02/20 1906 12/03/20 0031  AST 10* 9*  ALT 13 12  ALKPHOS 116 99  BILITOT 0.5 0.3  PROT 7.8 6.8  ALBUMIN 2.9* 2.6*    No results for input(s): LIPASE, AMYLASE in the last 168 hours. No results for input(s): AMMONIA in the last 168 hours. Coagulation Profile: No results for input(s): INR, PROTIME in the last 168 hours. Cardiac Enzymes: No results for input(s): CKTOTAL, CKMB, CKMBINDEX, TROPONINI in the last 168 hours. BNP (last 3 results) No results for input(s): PROBNP in the last 8760 hours. HbA1C: Recent Labs    12/02/20 2347  HGBA1C 13.1*    CBG: Recent Labs  Lab 12/03/20 1201 12/03/20 1805 12/03/20 2054 12/04/20 0741 12/04/20 1128  GLUCAP 108* 191* 228* 260* 213*    Lipid Profile: No results for input(s): CHOL, HDL, LDLCALC, TRIG, CHOLHDL, LDLDIRECT in the last 72 hours. Thyroid Function Tests: No results for input(s): TSH, T4TOTAL, FREET4, T3FREE, THYROIDAB in the last 72 hours. Anemia Panel: Recent Labs    12/04/20 0333  VITAMINB12 335  FOLATE 9.3  FERRITIN 196  TIBC 209*  IRON 15*  RETICCTPCT 0.7   Sepsis Labs: Recent Labs  Lab 12/02/20 1906 12/02/20 2041  LATICACIDVEN 1.1 1.1     Recent Results (from the past 240 hour(s))  Resp Panel by RT-PCR (Flu A&B, Covid) Nasopharyngeal Swab     Status: None   Collection Time: 12/02/20  7:06 PM   Specimen:  Nasopharyngeal Swab; Nasopharyngeal(NP) swabs in vial transport medium  Result Value Ref Range Status   SARS Coronavirus 2 by RT PCR NEGATIVE NEGATIVE Final    Comment: (NOTE) SARS-CoV-2 target nucleic acids are NOT DETECTED.  The SARS-CoV-2 RNA is generally detectable in upper respiratory specimens during the acute phase of infection. The lowest concentration of SARS-CoV-2 viral copies this assay can detect is 138 copies/mL. A negative result does not preclude SARS-Cov-2 infection and should not be used as the sole basis for treatment or other patient management decisions. A negative result may occur with  improper specimen collection/handling, submission of specimen other than nasopharyngeal swab, presence of viral mutation(s) within the areas targeted by this assay, and inadequate number of viral copies(<138 copies/mL). A negative result must be combined with clinical observations, patient history, and epidemiological information. The expected result is Negative.  Fact Sheet for Patients:  BloggerCourse.com  Fact Sheet for Healthcare Providers:  SeriousBroker.it  This test is no t yet  approved or cleared by the [place] FDA and  has been authorized for detection and/or diagnosis of SARS-CoV-2 by FDA under an Emergency Use Authorization (EUA). This EUA will remain  in effect (meaning this test can be used) for the duration of the COVID-19 declaration under Section 564(b)(1) of the Act, 21 U.S.C.section 360bbb-3(b)(1), unless the authorization is terminated  or revoked sooner.       Influenza A by PCR NEGATIVE NEGATIVE Final   Influenza B by PCR NEGATIVE NEGATIVE Final    Comment: (NOTE) The Xpert Xpress SARS-CoV-2/FLU/RSV plus assay is intended as an aid in the diagnosis of influenza from Nasopharyngeal swab specimens and should not be used as a sole basis for treatment. Nasal washings and aspirates are unacceptable for Xpert Xpress SARS-CoV-2/FLU/RSV testing.  Fact Sheet for Patients: BloggerCourse.com  Fact Sheet for Healthcare Providers: SeriousBroker.it  This test is not yet approved or cleared by the [place] FDA and has been authorized for detection and/or diagnosis of SARS-CoV-2 by FDA under an Emergency Use Authorization (EUA). This EUA will remain in effect (meaning this test can be used) for the duration of the COVID-19 declaration under Section 564(b)(1) of the Act, 21 U.S.C. section 360bbb-3(b)(1), unless the authorization is terminated or revoked.  Performed at [facility], [address] [place], [place] [address]   MRSA PCR Screening     Status: None   Collection Time: 12/02/20 10:49 PM  Result Value Ref Range Status   MRSA by PCR NEGATIVE NEGATIVE Final    Comment:        The GeneXpert MRSA Assay (FDA approved for NASAL specimens only), is one component of a comprehensive MRSA colonization surveillance program. It is not intended to diagnose MRSA infection nor to guide or monitor treatment for MRSA infections. Performed at [facility], [address] [place], [place] [address]           Radiology Studies: MR FOOT RIGHT WO CONTRAST  Result Date: 12/03/2020 CLINICAL DATA:  Acute foot pain.  Infection suspected EXAM: MRI OF THE RIGHT FOREFOOT WITHOUT CONTRAST TECHNIQUE: Multiplanar, multisequence MR imaging of the right forefoot was performed. No intravenous contrast was administered. COMPARISON:  X-ray 12/02/2020 FINDINGS: Bones/Joint/Cartilage Bone marrow edema throughout the proximal phalanx of the right second toe with intermediate T1 marrow signal compatible with acute osteomyelitis (series 6, images 11-13). Bone marrow edema within the middle phalanx of the second toe with preservation of the fatty T1 bone marrow signal. Preserved signal  within the second metatarsal head. Remaining osseous structures are within normal limits. No fracture or dislocation. Small joint effusion at the first MTP joint, nonspecific. Ligaments Intact Lisfranc ligament. Collateral ligaments of the forefoot appear intact. Muscles and Tendons Diffuse intramuscular edema suggesting myositis. Soft tissues Fluid and air containing collection predominantly located within the dorsal soft tissues of the forefoot extending to the base of the second toe measuring approximately 7.7 x 1.7 x 2.9 cm (series 5, image 11; series 4, image 19). Fluid collection extends into the first intermetatarsal space (series 4, image 20). Diffuse subcutaneous edema throughout the foot. Small collections of fluid noted within the cutaneous aspect of the dorsal forefoot suggest sites of skin blistering. IMPRESSION: 1. Acute osteomyelitis of the proximal phalanx of the right second toe. 2. Bone marrow edema within the middle phalanx of the second toe may represent reactive osteitis versus early acute osteomyelitis. 3. Fluid and air containing collection predominantly located within the dorsal soft tissues of the forefoot extending to the base of the second toe measuring up to 7.7 x 1.7 x 2.9 cm  compatible with abscess. Fluid collection extends into the first intermetatarsal space. 4. Small first MTP joint effusion which may be reactive or represent septic arthritis. 5. Diffuse intramuscular edema suggesting myositis. Electronically Signed   By: Duanne Guess D.O.   On: 12/03/2020 20:57   DG Foot Complete Right  Result Date: 12/02/2020 CLINICAL DATA:  Foot ulcer.  Hyperglycemia EXAM: RIGHT FOOT COMPLETE - 3+ VIEW COMPARISON:  None. FINDINGS: No evidence of acute fracture or dislocation in the right foot. No focal bone lesion or cortical changes to suggest osteomyelitis. 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 Mild dorsal soft tissue swelling with soft tissue gas demonstrated over the dorsum of the forefoot. This suggests cellulitis with gas-forming organism. No radiopaque soft tissue foreign bodies. IMPRESSION: No acute fracture or dislocation. Soft tissue swelling with soft tissue gas consistent with cellulitis. No radiographic changes of osteomyelitis. Electronically Signed   By: Burman Nieves M.D.   On: 12/02/2020 18:02        Scheduled Meds:  amLODipine  10 mg Oral Daily   aspirin EC  81 mg Oral Daily   atorvastatin  80 mg Oral QHS   Chlorhexidine Gluconate Cloth  6 each Topical Daily   cholecalciferol  1,000 Units Oral Daily   enoxaparin (LOVENOX) injection  40 mg Subcutaneous Q24H   insulin aspart  0-15 Units Subcutaneous TID WC   insulin aspart  0-5 Units Subcutaneous QHS   insulin aspart  4 Units Subcutaneous TID WC   insulin glargine  20 Units Subcutaneous Daily   lisinopril  40 mg Oral Daily   Continuous Infusions:  sodium chloride 100 mL/hr at 12/04/20 0939   sodium chloride Stopped (12/03/20 1016)   ceFEPime (MAXIPIME) IV 2 g (12/04/20 0943)   vancomycin 750 mg (12/04/20 1258)     LOS: 2 days    Time spent: 35 minutes.     Alba Cory, MD [facility]   If 7PM-7AM, please contact  night-coverage [email or web address]  12/04/2020, 2:04 PM

## 2020-12-04 NOTE — Plan of Care (Signed)
  Problem: Education: Goal: Knowledge of General Education information will improve Description Including pain rating scale, medication(s)/side effects and non-pharmacologic comfort measures Outcome: Progressing   

## 2020-12-04 NOTE — Anesthesia Preprocedure Evaluation (Addendum)
Anesthesia Evaluation  Patient identified by MRN, date of birth, ID band Patient awake    Reviewed: Allergy & Precautions, NPO status , Patient's Chart, lab work & pertinent test results  Airway Mallampati: I  TM Distance: >3 FB Neck ROM: Full    Dental no notable dental hx. (+) Teeth Intact, Dental Advisory Given   Pulmonary asthma , Current Smoker,    Pulmonary exam normal breath sounds clear to auscultation       Cardiovascular hypertension, Pt. on medications Normal cardiovascular exam Rhythm:Regular Rate:Normal     Neuro/Psych  Headaches, PSYCHIATRIC DISORDERS Depression Bipolar Disorder    GI/Hepatic negative GI ROS, Neg liver ROS,   Endo/Other  diabetes, Poorly Controlled, Type 2, Insulin Dependent  Renal/GU negative Renal ROS  negative genitourinary   Musculoskeletal negative musculoskeletal ROS (+)   Abdominal   Peds negative pediatric ROS (+)  Hematology  (+) Blood dyscrasia, anemia ,   Anesthesia Other Findings   Reproductive/Obstetrics negative OB ROS                            Anesthesia Physical Anesthesia Plan  ASA: 3 and emergent  Anesthesia Plan: General   Post-op Pain Management:    Induction: Intravenous  PONV Risk Score and Plan: 3 and Ondansetron  Airway Management Planned: LMA  Additional Equipment: None  Intra-op Plan:   Post-operative Plan: Extubation in OR  Informed Consent:     Dental advisory given  Plan Discussed with: Anesthesiologist and CRNA  Anesthesia Plan Comments: (  )       Anesthesia Quick Evaluation

## 2020-12-04 NOTE — Interval H&P Note (Signed)
History and Physical Interval Note:  12/04/2020 2:41 PM  Valerie Le  has presented today for surgery, with the diagnosis of Diabetic Right foot, second toe infection, Dorsal foot abscess.  The various methods of treatment have been discussed with the patient and family. After consideration of risks, benefits and other options for treatment, the patient has consented to  Procedure(s) with comments: AMPUTATION TOE (Right) - Right second toe amputation, I&D right foot. as a surgical intervention.  The patient's history has been reviewed, patient examined, no change in status, stable for surgery.  I have reviewed the patient's chart and labs.  Questions were answered to the patient's satisfaction.     Eldred Manges

## 2020-12-04 NOTE — Transfer of Care (Signed)
Immediate Anesthesia Transfer of Care Note  Patient: Valerie Le  Procedure(s) Performed: AMPUTATION RIGHT SECOND TOE, I&D FOOT (Right: Toe)  Patient Location: PACU  Anesthesia Type:General  Level of Consciousness: drowsy and patient cooperative  Airway & Oxygen Therapy: Patient Spontanous Breathing and Patient connected to face mask oxygen  Post-op Assessment: Report given to RN and Post -op Vital signs reviewed and stable  Post vital signs: Reviewed and stable  Last Vitals:  Vitals Value Taken Time  BP 109/72 12/04/20 1539  Temp 36.3 C 12/04/20 1539  Pulse 81 12/04/20 1544  Resp 0 12/04/20 1544  SpO2 100 % 12/04/20 1544  Vitals shown include unvalidated device data.  Last Pain:  Vitals:   12/04/20 1539  TempSrc:   PainSc: Asleep      Patients Stated Pain Goal: 4 (12/04/20 1308)  Complications: No notable events documented.

## 2020-12-04 NOTE — Consult Note (Signed)
Reason for Consult: Diabetes with right foot abscess and second toe osteomyelitis Referring Physician: Sunnie Nielsen MD  Valerie Le is an 50 y.o. female.  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  She states she was working 80 hours a week working full-time plus going to school until recently.  She has had draining ulcer over her right second toe dorsally PIP joint where it rubs the shoe.  Patient is insulin-dependent with insulin pump.  She has hypertension hyperlipidemia diabetic neuropathy.  Patient admitted with elevated white count 15,000 with left shift.  Hyperglycemia.  Plain radiographs negative for osteo but positive for dorsal foot abscess over the second metatarsal with gas in the soft tissue dorsally.  MRI scan showed second toe proximal phalanx osteomyelitis.  Past Medical History:  Diagnosis Date   Diabetes mellitus without complication (HCC)    High cholesterol    Hypertension     Past Surgical History:  Procedure Laterality Date   TONSILLECTOMY     TYMPANOSTOMY TUBE PLACEMENT      History reviewed. No pertinent family history.  Social History:  reports that she has been smoking cigarettes. She has never used smokeless tobacco. She reports that she does not drink alcohol and does not use drugs.  Allergies:  Allergies  Allergen Reactions   Coconut Oil Anaphylaxis   Pineapple Anaphylaxis   Tomato Anaphylaxis   Latex Hives   Metformin And Related Other (See Comments)    Constipation/cramps    Medications: I have reviewed the patient's current medications.  Results for orders placed or performed during the hospital encounter of 12/02/20 (from the past 48 hour(s))  CBG monitoring, ED     Status: Abnormal   Collection Time: 12/02/20  5:09 PM  Result Value Ref Range   Glucose-Capillary 488 (H) 70 - 99 mg/dL    Comment: Glucose reference range applies only  to samples taken after fasting for at least 8 hours.  Comprehensive metabolic panel     Status: Abnormal   Collection Time: 12/02/20  7:06 PM  Result Value Ref Range   Sodium 130 (L) 135 - 145 mmol/L   Potassium 3.5 3.5 - 5.1 mmol/L   Chloride 92 (L) 98 - 111 mmol/L   CO2 28 22 - 32 mmol/L   Glucose, Bld 509 (HH) 70 - 99 mg/dL    Comment: Glucose reference range applies only to samples taken after fasting for at least 8 hours. CRITICAL RESULT CALLED TO, READ BACK BY AND VERIFIED WITH: L.ADKINS RN @ 1959 ON 12/02/20 BY K.SHAW    BUN 13 6 - 20 mg/dL   Creatinine, Ser 0.45 0.44 - 1.00 mg/dL   Calcium 8.5 (L) 8.9 - 10.3 mg/dL   Total Protein 7.8 6.5 - 8.1 g/dL   Albumin 2.9 (L) 3.5 - 5.0 g/dL   AST 10 (L) 15 - 41 U/L   ALT 13 0 - 44 U/L   Alkaline Phosphatase 116 38 - 126 U/L   Total Bilirubin 0.5 0.3 - 1.2 mg/dL   GFR, Estimated >40 >98 mL/min    Comment: (NOTE) Calculated using the CKD-EPI Creatinine Equation (2021)    Anion gap 10 5 - 15    Comment: Performed at [facility], [address] [place], [place] [address]  Lactic acid, plasma     Status: None   Collection Time: 12/02/20  7:06 PM  Result Value  Ref Range   Lactic Acid, Venous 1.1 0.5 - 1.9 mmol/L    Comment: Performed at [facility], [address] [place], [place] [address]  CBC with Differential     Status: Abnormal   Collection Time: 12/02/20  7:06 PM  Result Value Ref Range   WBC 15.0 (H) 4.0 - 10.5 K/uL   RBC 4.67 3.87 - 5.11 MIL/uL   Hemoglobin 11.3 (L) 12.0 - 15.0 g/dL   HCT 60.4 (L) 54.0 - 98.1 %   MCV 74.1 (L) 80.0 - 100.0 fL   MCH 24.2 (L) 26.0 - 34.0 pg   MCHC 32.7 30.0 - 36.0 g/dL   RDW 19.1 (H) 47.8 - 29.5 %   Platelets 249 150 - 400 K/uL   nRBC 0.0 0.0 - 0.2 %   Neutrophils Relative % 77 %   Neutro Abs 11.6 (H) 1.7 - 7.7 K/uL   Lymphocytes Relative 12 %   Lymphs Abs 1.8 0.7 - 4.0 K/uL   Monocytes Relative 6 %   Monocytes Absolute 0.9 0.1 - 1.0 K/uL   Eosinophils  Relative 2 %   Eosinophils Absolute 0.3 0.0 - 0.5 K/uL   Basophils Relative 1 %   Basophils Absolute 0.1 0.0 - 0.1 K/uL   Immature Granulocytes 2 %   Abs Immature Granulocytes 0.26 (H) 0.00 - 0.07 K/uL    Comment: Performed at [facility], [address] [place], [place] [address]  Resp Panel by RT-PCR (Flu A&B, Covid) Nasopharyngeal Swab     Status: None   Collection Time: 12/02/20  7:06 PM   Specimen: Nasopharyngeal Swab; Nasopharyngeal(NP) swabs in vial transport medium  Result Value Ref Range   SARS Coronavirus 2 by RT PCR NEGATIVE NEGATIVE    Comment: (NOTE) SARS-CoV-2 target nucleic acids are NOT DETECTED.  The SARS-CoV-2 RNA is generally detectable in upper respiratory specimens during the acute phase of infection. The lowest concentration of SARS-CoV-2 viral copies this assay can detect is 138 copies/mL. A negative result does not preclude SARS-Cov-2 infection and should not be used as the sole basis for treatment or other patient management decisions. A negative result may occur with  improper specimen collection/handling, submission of specimen other than nasopharyngeal swab, presence of viral mutation(s) within the areas targeted by this assay, and inadequate number of viral copies(<138 copies/mL). A negative result must be combined with clinical observations, patient history, and epidemiological information. The expected result is Negative.  Fact Sheet for Patients:  BloggerCourse.com  Fact Sheet for Healthcare Providers:  SeriousBroker.it  This test is no t yet approved or cleared by the [place] FDA and  has been authorized for detection and/or diagnosis of SARS-CoV-2 by FDA under an Emergency Use Authorization (EUA). This EUA will remain  in effect (meaning this test can be used) for the duration of the COVID-19 declaration under Section 564(b)(1) of the Act, 21 U.S.C.section 360bbb-3(b)(1),  unless the authorization is terminated  or revoked sooner.       Influenza A by PCR NEGATIVE NEGATIVE   Influenza B by PCR NEGATIVE NEGATIVE    Comment: (NOTE) The Xpert Xpress SARS-CoV-2/FLU/RSV plus assay is intended as an aid in the diagnosis of influenza from Nasopharyngeal swab specimens and should not be used as a sole basis for treatment. Nasal washings and aspirates are unacceptable for Xpert Xpress SARS-CoV-2/FLU/RSV testing.  Fact Sheet for Patients: BloggerCourse.com  Fact Sheet for Healthcare Providers: SeriousBroker.it  This test is not yet approved or cleared  by the [place] and has been authorized for detection and/or diagnosis of SARS-CoV-2 by FDA under an Emergency Use Authorization (EUA). This EUA will remain in effect (meaning this test can be used) for the duration of the COVID-19 declaration under Section 564(b)(1) of the Act, 21 U.S.C. section 360bbb-3(b)(1), unless the authorization is terminated or revoked.  Performed at [facility], [address] [place], [place] [address]   Lactic acid, plasma     Status: None   Collection Time: 12/02/20  8:41 PM  Result Value Ref Range   Lactic Acid, Venous 1.1 0.5 - 1.9 mmol/L    Comment: Performed at [facility], [address] [place], [place] [address]  CBG monitoring, ED     Status: Abnormal   Collection Time: 12/02/20  9:35 PM  Result Value Ref Range   Glucose-Capillary 410 (H) 70 - 99 mg/dL    Comment: Glucose reference range applies only to samples taken after fasting for at least 8 hours.  Glucose, capillary     Status: Abnormal   Collection Time: 12/02/20 10:44 PM  Result Value Ref Range   Glucose-Capillary 326 (H) 70 - 99 mg/dL    Comment: Glucose reference range applies only to samples taken after fasting for at least 8 hours.   Comment 1 Notify RN    Comment 2 Document in Chart   MRSA PCR Screening     Status:  None   Collection Time: 12/02/20 10:49 PM  Result Value Ref Range   MRSA by PCR NEGATIVE NEGATIVE    Comment:        The GeneXpert MRSA Assay (FDA approved for NASAL specimens only), is one component of a comprehensive MRSA colonization surveillance program. It is not intended to diagnose MRSA infection nor to guide or monitor treatment for MRSA infections. Performed at [facility], [address] [place], [place] [address]   Hemoglobin A1c     Status: Abnormal   Collection Time: 12/02/20 11:47 PM  Result Value Ref Range   Hgb A1c MFr Bld 13.1 (H) 4.8 - 5.6 %    Comment: (NOTE)         Prediabetes: 5.7 - 6.4         Diabetes: >6.4         Glycemic control for adults with diabetes: <7.0    Mean Plasma Glucose 329 mg/dL    Comment: (NOTE) Performed At: [facility] [address] [place], [place] [id number] Jolene Schimke MD OZ:[id number]   HIV Antibody (routine testing w rflx)     Status: None   Collection Time: 12/02/20 11:47 PM  Result Value Ref Range   HIV Screen 4th Generation wRfx Non Reactive Non Reactive    Comment: Performed at [facility], [address], [place], [place] [address]  CBC     Status: Abnormal   Collection Time: 12/02/20 11:47 PM  Result Value Ref Range   WBC 13.5 (H) 4.0 - 10.5 K/uL   RBC 4.06 3.87 - 5.11 MIL/uL   Hemoglobin 9.9 (L) 12.0 - 15.0 g/dL   HCT 84.1 (L) 32.4 - 40.1 %   MCV 75.6 (L) 80.0 - 100.0 fL   MCH 24.4 (L) 26.0 - 34.0 pg   MCHC 32.2 30.0 - 36.0 g/dL   RDW 02.7 (H) 25.3 - 66.4 %   Platelets 235 150 - 400 K/uL   nRBC 0.0 0.0 - 0.2 %    Comment:  Performed at [facility], [address] [place], [place] [address]  Basic metabolic panel     Status: Abnormal   Collection Time: 12/02/20 11:47 PM  Result Value Ref Range   Sodium 137 135 - 145 mmol/L   Potassium 3.3 (L) 3.5 - 5.1 mmol/L   Chloride 101 98 - 111 mmol/L   CO2 27 22 - 32 mmol/L   Glucose, Bld 239 (H) 70 - 99  mg/dL    Comment: Glucose reference range applies only to samples taken after fasting for at least 8 hours.   BUN 11 6 - 20 mg/dL   Creatinine, Ser 0.53 0.44 - 1.00 mg/dL   Calcium 8.4 (L) 8.9 - 10.3 mg/dL   GFR, Estimated >97 >67 mL/min    Comment: (NOTE) Calculated using the CKD-EPI Creatinine Equation (2021)    Anion gap 9 5 - 15    Comment: Performed at [facility], [address], [place], [place] [address]  Glucose, capillary     Status: Abnormal   Collection Time: 12/02/20 11:47 PM  Result Value Ref Range   Glucose-Capillary 249 (H) 70 - 99 mg/dL    Comment: Glucose reference range applies only to samples taken after fasting for at least 8 hours.  Comprehensive metabolic panel     Status: Abnormal   Collection Time: 12/03/20 12:31 AM  Result Value Ref Range   Sodium 137 135 - 145 mmol/L   Potassium 3.3 (L) 3.5 - 5.1 mmol/L   Chloride 100 98 - 111 mmol/L   CO2 27 22 - 32 mmol/L   Glucose, Bld 236 (H) 70 - 99 mg/dL    Comment: Glucose reference range applies only to samples taken after fasting for at least 8 hours.   BUN 11 6 - 20 mg/dL   Creatinine, Ser 7.90 0.44 - 1.00 mg/dL   Calcium 8.4 (L) 8.9 - 10.3 mg/dL   Total Protein 6.8 6.5 - 8.1 g/dL   Albumin 2.6 (L) 3.5 - 5.0 g/dL   AST 9 (L) 15 - 41 U/L   ALT 12 0 - 44 U/L   Alkaline Phosphatase 99 38 - 126 U/L   Total Bilirubin 0.3 0.3 - 1.2 mg/dL   GFR, Estimated >24 >09 mL/min    Comment: (NOTE) Calculated using the CKD-EPI Creatinine Equation (2021)    Anion gap 10 5 - 15    Comment: Performed at [facility], [address], [place], [place] [address]  CBC     Status: Abnormal   Collection Time: 12/03/20 12:31 AM  Result Value Ref Range   WBC 13.7 (H) 4.0 - 10.5 K/uL   RBC 3.83 (L) 3.87 - 5.11 MIL/uL   Hemoglobin 9.5 (L) 12.0 - 15.0 g/dL   HCT 99.2 (L) 42.6 - 83.4 %   MCV 76.0 (L) 80.0 - 100.0 fL   MCH 24.8 (L) 26.0 - 34.0 pg   MCHC 32.6 30.0 - 36.0 g/dL   RDW 19.6 (H)  22.2 - 15.5 %   Platelets 246 150 - 400 K/uL   nRBC 0.0 0.0 - 0.2 %    Comment: Performed at [facility], [address], [place], [place] [address]  Glucose, capillary     Status: Abnormal   Collection Time: 12/03/20 12:46 AM  Result Value Ref Range   Glucose-Capillary 168 (H) 70 - 99 mg/dL    Comment: Glucose reference range applies only to samples taken after fasting for at least 8 hours.  Glucose, capillary     Status: Abnormal   Collection Time: 12/03/20  1:50 AM  Result Value Ref Range   Glucose-Capillary 219 (H) 70 - 99 mg/dL    Comment: Glucose reference range applies only to samples taken after fasting for at least 8 hours.  Glucose, capillary     Status: Abnormal   Collection Time: 12/03/20  2:48 AM  Result Value Ref Range   Glucose-Capillary 204 (H) 70 - 99 mg/dL    Comment: Glucose reference range applies only to samples taken after fasting for at least 8 hours.   Comment 1 Notify RN    Comment 2 Document in Chart   Glucose, capillary     Status: Abnormal   Collection Time: 12/03/20  3:49 AM  Result Value Ref Range   Glucose-Capillary 170 (H) 70 - 99 mg/dL    Comment: Glucose reference range applies only to samples taken after fasting for at least 8 hours.  Glucose, capillary     Status: Abnormal   Collection Time: 12/03/20  4:41 AM  Result Value Ref Range   Glucose-Capillary 142 (H) 70 - 99 mg/dL    Comment: Glucose reference range applies only to samples taken after fasting for at least 8 hours.  Glucose, capillary     Status: Abnormal   Collection Time: 12/03/20  6:00 AM  Result Value Ref Range   Glucose-Capillary 121 (H) 70 - 99 mg/dL    Comment: Glucose reference range applies only to samples taken after fasting for at least 8 hours.  Glucose, capillary     Status: Abnormal   Collection Time: 12/03/20  6:55 AM  Result Value Ref Range   Glucose-Capillary 136 (H) 70 - 99 mg/dL    Comment: Glucose reference range applies only to samples taken  after fasting for at least 8 hours.  Basic metabolic panel     Status: Abnormal   Collection Time: 12/03/20  7:29 AM  Result Value Ref Range   Sodium 137 135 - 145 mmol/L   Potassium 3.4 (L) 3.5 - 5.1 mmol/L   Chloride 105 98 - 111 mmol/L   CO2 28 22 - 32 mmol/L   Glucose, Bld 137 (H) 70 - 99 mg/dL    Comment: Glucose reference range applies only to samples taken after fasting for at least 8 hours.   BUN 11 6 - 20 mg/dL   Creatinine, Ser 9.37 0.44 - 1.00 mg/dL   Calcium 8.0 (L) 8.9 - 10.3 mg/dL   GFR, Estimated >16 >96 mL/min    Comment: (NOTE) Calculated using the CKD-EPI Creatinine Equation (2021)    Anion gap 4 (L) 5 - 15    Comment: Performed at [facility], [address] [place], [place] [address]  Glucose, capillary     Status: Abnormal   Collection Time: 12/03/20  7:52 AM  Result Value Ref Range   Glucose-Capillary 137 (H) 70 - 99 mg/dL    Comment: Glucose reference range applies only to samples taken after fasting for at least 8 hours.  Glucose, capillary     Status: Abnormal   Collection Time: 12/03/20  9:29 AM  Result Value Ref Range   Glucose-Capillary 130 (H) 70 - 99 mg/dL    Comment: Glucose reference range applies only to samples taken after fasting for at least 8 hours.  Basic metabolic panel     Status: Abnormal   Collection Time: 12/03/20 11:20 AM  Result Value Ref Range   Sodium 138 135 -  145 mmol/L   Potassium 4.0 3.5 - 5.1 mmol/L   Chloride 103 98 - 111 mmol/L   CO2 27 22 - 32 mmol/L   Glucose, Bld 121 (H) 70 - 99 mg/dL    Comment: Glucose reference range applies only to samples taken after fasting for at least 8 hours.   BUN 11 6 - 20 mg/dL   Creatinine, Ser 1.61 0.44 - 1.00 mg/dL   Calcium 8.4 (L) 8.9 - 10.3 mg/dL   GFR, Estimated >09 >60 mL/min    Comment: (NOTE) Calculated using the CKD-EPI Creatinine Equation (2021)    Anion gap 8 5 - 15    Comment: Performed at [facility], [address]  [place], [place] [address]  Glucose, capillary     Status: Abnormal   Collection Time: 12/03/20 12:01 PM  Result Value Ref Range   Glucose-Capillary 108 (H) 70 - 99 mg/dL    Comment: Glucose reference range applies only to samples taken after fasting for at least 8 hours.  Basic metabolic panel     Status: Abnormal   Collection Time: 12/03/20  3:02 PM  Result Value Ref Range   Sodium 137 135 - 145 mmol/L   Potassium 4.2 3.5 - 5.1 mmol/L   Chloride 103 98 - 111 mmol/L   CO2 24 22 - 32 mmol/L   Glucose, Bld 166 (H) 70 - 99 mg/dL    Comment: Glucose reference range applies only to samples taken after fasting for at least 8 hours.   BUN 10 6 - 20 mg/dL   Creatinine, Ser 8.11 0.44 - 1.00 mg/dL   Calcium 8.3 (L) 8.9 - 10.3 mg/dL   GFR, Estimated >91 >47 mL/min    Comment: (NOTE) Calculated using the CKD-EPI Creatinine Equation (2021)    Anion gap 10 5 - 15    Comment: Performed at [facility], [address] [place], [place] [address]  Glucose, capillary     Status: Abnormal   Collection Time: 12/03/20  6:05 PM  Result Value Ref Range   Glucose-Capillary 191 (H) 70 - 99 mg/dL    Comment: Glucose reference range applies only to samples taken after fasting for at least 8 hours.  Glucose, capillary     Status: Abnormal   Collection Time: 12/03/20  8:54 PM  Result Value Ref Range   Glucose-Capillary 228 (H) 70 - 99 mg/dL    Comment: Glucose reference range applies only to samples taken after fasting for at least 8 hours.  Vitamin B12     Status: None   Collection Time: 12/04/20  3:33 AM  Result Value Ref Range   Vitamin B-12 335 180 - 914 pg/mL    Comment: (NOTE) This assay is not validated for testing neonatal or myeloproliferative syndrome specimens for Vitamin B12 levels. Performed at [facility], [address] [place], [place] [address]   Folate     Status: None   Collection Time: 12/04/20  3:33 AM  Result Value Ref Range   Folate 9.3 >5.9  ng/mL    Comment: Performed at [facility], [address] [place], [place] [address]  Iron and TIBC     Status: Abnormal   Collection Time: 12/04/20  3:33 AM  Result Value Ref Range   Iron 15 (L) 28 - 170 ug/dL   TIBC 696 (L) 295 - 284 ug/dL   Saturation Ratios 7 (L) 10.4 - 31.8 %   UIBC 194 ug/dL  Comment: Performed at [facility], [address] [place], [place] [address]  Ferritin     Status: None   Collection Time: 12/04/20  3:33 AM  Result Value Ref Range   Ferritin 196 11 - 307 ng/mL    Comment: Performed at [facility], [address], [place], [place] [address]  Reticulocytes     Status: Abnormal   Collection Time: 12/04/20  3:33 AM  Result Value Ref Range   Retic Ct Pct 0.7 0.4 - 3.1 %   RBC. 4.05 3.87 - 5.11 MIL/uL   Retic Count, Absolute 29.6 19.0 - 186.0 K/uL   Immature Retic Fract 21.1 (H) 2.3 - 15.9 %    Comment: Performed at [facility], [address], [place], [place] [address]  Glucose, capillary     Status: Abnormal   Collection Time: 12/04/20  7:41 AM  Result Value Ref Range   Glucose-Capillary 260 (H) 70 - 99 mg/dL    Comment: Glucose reference range applies only to samples taken after fasting for at least 8 hours.  CBC     Status: Abnormal   Collection Time: 12/04/20  8:01 AM  Result Value Ref Range   WBC 10.3 4.0 - 10.5 K/uL   RBC 4.15 3.87 - 5.11 MIL/uL   Hemoglobin 10.1 (L) 12.0 - 15.0 g/dL   HCT 53.6 (L) 14.4 - 31.5 %   MCV 76.1 (L) 80.0 - 100.0 fL   MCH 24.3 (L) 26.0 - 34.0 pg   MCHC 32.0 30.0 - 36.0 g/dL   RDW 40.0 (H) 86.7 - 61.9 %   Platelets 274 150 - 400 K/uL   nRBC 0.0 0.0 - 0.2 %    Comment: Performed at [facility], [address] [place], [place] [address]  Basic metabolic panel     Status: Abnormal   Collection Time: 12/04/20  8:01 AM  Result Value Ref Range   Sodium 136 135 - 145 mmol/L   Potassium 4.3 3.5 - 5.1 mmol/L    Chloride 105 98 - 111 mmol/L   CO2 25 22 - 32 mmol/L   Glucose, Bld 271 (H) 70 - 99 mg/dL    Comment: Glucose reference range applies only to samples taken after fasting for at least 8 hours.   BUN 10 6 - 20 mg/dL   Creatinine, Ser 6.71 0.44 - 1.00 mg/dL   Calcium 8.3 (L) 8.9 - 10.3 mg/dL   GFR, Estimated >24 >58 mL/min    Comment: (NOTE) Calculated using the CKD-EPI Creatinine Equation (2021)    Anion gap 6 5 - 15    Comment: Performed at [facility], [address], [place], [place] [address]    MR FOOT RIGHT WO CONTRAST  Result Date: 12/03/2020 CLINICAL DATA:  Acute foot pain.  Infection suspected EXAM: MRI OF THE RIGHT FOREFOOT WITHOUT CONTRAST TECHNIQUE: Multiplanar, multisequence MR imaging of the right forefoot was performed. No intravenous contrast was administered. COMPARISON:  X-ray 12/02/2020 FINDINGS: Bones/Joint/Cartilage Bone marrow edema throughout the proximal phalanx of the right second toe with intermediate T1 marrow signal compatible with acute osteomyelitis (series 6, images 11-13). Bone marrow edema within the middle phalanx of the second toe with preservation of the fatty T1 bone marrow signal. Preserved signal within the second metatarsal head. Remaining osseous structures are within normal limits. No fracture or dislocation. Small joint effusion at the first MTP joint, nonspecific. Ligaments Intact Lisfranc ligament. Collateral ligaments of the forefoot appear intact.  Muscles and Tendons Diffuse intramuscular edema suggesting myositis. Soft tissues Fluid and air containing collection predominantly located within the dorsal soft tissues of the forefoot extending to the base of the second toe measuring approximately 7.7 x 1.7 x 2.9 cm (series 5, image 11; series 4, image 19). Fluid collection extends into the first intermetatarsal space (series 4, image 20). Diffuse subcutaneous edema throughout the foot. Small collections of fluid noted within the cutaneous  aspect of the dorsal forefoot suggest sites of skin blistering. IMPRESSION: 1. Acute osteomyelitis of the proximal phalanx of the right second toe. 2. Bone marrow edema within the middle phalanx of the second toe may represent reactive osteitis versus early acute osteomyelitis. 3. Fluid and air containing collection predominantly located within the dorsal soft tissues of the forefoot extending to the base of the second toe measuring up to 7.7 x 1.7 x 2.9 cm compatible with abscess. Fluid collection extends into the first intermetatarsal space. 4. Small first MTP joint effusion which may be reactive or represent septic arthritis. 5. Diffuse intramuscular edema suggesting myositis. Electronically Signed   By: Duanne Guess D.O.   On: 12/03/2020 20:57   DG Foot Complete Right  Result Date: 12/02/2020 CLINICAL DATA:  Foot ulcer.  Hyperglycemia EXAM: RIGHT FOOT COMPLETE - 3+ VIEW COMPARISON:  None. FINDINGS: No evidence of acute fracture or dislocation in the right foot. No focal bone lesion or cortical changes to suggest osteomyelitis. 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 Mild dorsal soft tissue swelling with soft tissue gas demonstrated over the dorsum of the forefoot. This suggests cellulitis with gas-forming organism. No radiopaque soft tissue foreign bodies. IMPRESSION: No acute fracture or dislocation. Soft tissue swelling with soft tissue gas consistent with cellulitis. No radiographic changes of osteomyelitis. Electronically Signed   By: Burman Nieves M.D.   On: 12/02/2020 18:02    ROS recent sign sinus tachycardia related to infection.  Positive for neuropathy of her feet.  Infection drainage right second toe.  Positive for insulin-dependent diabetes.  Neurologic  otherwise negative.  All other systems noncontributory. Blood pressure 138/82, pulse 99, temperature 98.7 F (37.1 C), temperature source Oral, resp. rate 18, height  (1.803  m), weight 98.9 kg, SpO2 93 %. Physical Exam HENT:     Head: Normocephalic.     Right Ear: External ear normal.     Left Ear: External ear normal.  Eyes:     Extraocular Movements: Extraocular movements intact.  Cardiovascular:     Rate and Rhythm: Tachycardia present.  Pulmonary:     Effort: Pulmonary effort is normal.     Breath sounds: No wheezing.  Musculoskeletal:     Cervical back: Normal range of motion.  Skin:    Capillary Refill: Capillary refill takes less than 2 seconds.  Neurological:     Mental Status: She is alert.  Psychiatric:        Mood and Affect: Mood normal.        Behavior: Behavior normal.        Thought Content: Thought content normal.    Assessment/Plan: Patient with poor diabetic control despite insulin pump.  A1c is greater than 13.  She has osteomyelitis right second toe will require toe amputation as well as washout of dorsal abscess and placement of VAC.  She will need at least couple days of IV antibiotics and then more specific antibiotic therapy treatment based on cultures which sometimes are multi organism.  Discussed possibility of  a second procedure would be needed but usually 1 procedure should take care of it.  Long discussion with patient about improved diabetic compliance and care monitoring her sugars better, watching her diet etc.  She understands that if she does not improve her diabetic control she is likely to have further foot surgeries and amputations.  Questions was then answered.  We reviewed her MRI scan as well as report, reviewed plain radiographs as well as report and discussed outlined treatment plan.  Eldred MangesMark C Kriss Ishler 12/04/2020, 9:34 AM

## 2020-12-04 NOTE — H&P (View-Only) (Signed)
 Reason for Consult: Diabetes with right foot abscess and second toe osteomyelitis Referring Physician: Regalado MD  Valerie Le is an 50 y.o. female.  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  She states she was working 80 hours a week working full-time plus going to school until recently.  She has had draining ulcer over her right second toe dorsally PIP joint where it rubs the shoe.  Patient is insulin-dependent with insulin pump.  She has hypertension hyperlipidemia diabetic neuropathy.  Patient admitted with elevated white count 15,000 with left shift.  Hyperglycemia.  Plain radiographs negative for osteo but positive for dorsal foot abscess over the second metatarsal with gas in the soft tissue dorsally.  MRI scan showed second toe proximal phalanx osteomyelitis.  Past Medical History:  Diagnosis Date   Diabetes mellitus without complication (HCC)    High cholesterol    Hypertension     Past Surgical History:  Procedure Laterality Date   TONSILLECTOMY     TYMPANOSTOMY TUBE PLACEMENT      History reviewed. No pertinent family history.  Social History:  reports that she has been smoking cigarettes. She has never used smokeless tobacco. She reports that she does not drink alcohol and does not use drugs.  Allergies:  Allergies  Allergen Reactions   Coconut Oil Anaphylaxis   Pineapple Anaphylaxis   Tomato Anaphylaxis   Latex Hives   Metformin And Related Other (See Comments)    Constipation/cramps    Medications: I have reviewed the patient's current medications.  Results for orders placed or performed during the hospital encounter of 12/02/20 (from the past 48 hour(s))  CBG monitoring, ED     Status: Abnormal   Collection Time: 12/02/20  5:09 PM  Result Value Ref Range   Glucose-Capillary 488 (H) 70 - 99 mg/dL    Comment: Glucose reference range applies only  to samples taken after fasting for at least 8 hours.  Comprehensive metabolic panel     Status: Abnormal   Collection Time: 12/02/20  7:06 PM  Result Value Ref Range   Sodium 130 (L) 135 - 145 mmol/L   Potassium 3.5 3.5 - 5.1 mmol/L   Chloride 92 (L) 98 - 111 mmol/L   CO2 28 22 - 32 mmol/L   Glucose, Bld 509 (HH) 70 - 99 mg/dL    Comment: Glucose reference range applies only to samples taken after fasting for at least 8 hours. CRITICAL RESULT CALLED TO, READ BACK BY AND VERIFIED WITH: L.ADKINS RN @ 1959 ON 12/02/20 BY K.SHAW    BUN 13 6 - 20 mg/dL   Creatinine, Ser 0.75 0.44 - 1.00 mg/dL   Calcium 8.5 (L) 8.9 - 10.3 mg/dL   Total Protein 7.8 6.5 - 8.1 g/dL   Albumin 2.9 (L) 3.5 - 5.0 g/dL   AST 10 (L) 15 - 41 U/L   ALT 13 0 - 44 U/L   Alkaline Phosphatase 116 38 - 126 U/L   Total Bilirubin 0.5 0.3 - 1.2 mg/dL   GFR, Estimated >60 >60 mL/min    Comment: (NOTE) Calculated using the CKD-EPI Creatinine Equation (2021)    Anion gap 10 5 - 15    Comment: Performed at [facility] [place], [address] [place], [place] [address]  Lactic acid, plasma     Status: None   Collection Time: 12/02/20  7:06 PM  Result Value   Ref Range   Lactic Acid, Venous 1.1 0.5 - 1.9 mmol/L    Comment: Performed at [facility] [place], [address] [place], [place] [address]  CBC with Differential     Status: Abnormal   Collection Time: 12/02/20  7:06 PM  Result Value Ref Range   WBC 15.0 (H) 4.0 - 10.5 K/uL   RBC 4.67 3.87 - 5.11 MIL/uL   Hemoglobin 11.3 (L) 12.0 - 15.0 g/dL   HCT 34.6 (L) 36.0 - 46.0 %   MCV 74.1 (L) 80.0 - 100.0 fL   MCH 24.2 (L) 26.0 - 34.0 pg   MCHC 32.7 30.0 - 36.0 g/dL   RDW 15.9 (H) 11.5 - 15.5 %   Platelets 249 150 - 400 K/uL   nRBC 0.0 0.0 - 0.2 %   Neutrophils Relative % 77 %   Neutro Abs 11.6 (H) 1.7 - 7.7 K/uL   Lymphocytes Relative 12 %   Lymphs Abs 1.8 0.7 - 4.0 K/uL   Monocytes Relative 6 %   Monocytes Absolute 0.9 0.1 - 1.0 K/uL   Eosinophils  Relative 2 %   Eosinophils Absolute 0.3 0.0 - 0.5 K/uL   Basophils Relative 1 %   Basophils Absolute 0.1 0.0 - 0.1 K/uL   Immature Granulocytes 2 %   Abs Immature Granulocytes 0.26 (H) 0.00 - 0.07 K/uL    Comment: Performed at [facility] [place], [address] [place], [place] [address]  Resp Panel by RT-PCR (Flu A&B, Covid) Nasopharyngeal Swab     Status: None   Collection Time: 12/02/20  7:06 PM   Specimen: Nasopharyngeal Swab; Nasopharyngeal(NP) swabs in vial transport medium  Result Value Ref Range   SARS Coronavirus 2 by RT PCR NEGATIVE NEGATIVE    Comment: (NOTE) SARS-CoV-2 target nucleic acids are NOT DETECTED.  The SARS-CoV-2 RNA is generally detectable in upper respiratory specimens during the acute phase of infection. The lowest concentration of SARS-CoV-2 viral copies this assay can detect is 138 copies/mL. A negative result does not preclude SARS-Cov-2 infection and should not be used as the sole basis for treatment or other patient management decisions. A negative result may occur with  improper specimen collection/handling, submission of specimen other than nasopharyngeal swab, presence of viral mutation(s) within the areas targeted by this assay, and inadequate number of viral copies(<138 copies/mL). A negative result must be combined with clinical observations, patient history, and epidemiological information. The expected result is Negative.  Fact Sheet for Patients:  https://www.fda.gov/media/152166/download  Fact Sheet for Healthcare Providers:  https://www.fda.gov/media/152162/download  This test is no t yet approved or cleared by the [place] FDA and  has been authorized for detection and/or diagnosis of SARS-CoV-2 by FDA under an Emergency Use Authorization (EUA). This EUA will remain  in effect (meaning this test can be used) for the duration of the COVID-19 declaration under Section 564(b)(1) of the Act, 21 U.S.C.section 360bbb-3(b)(1),  unless the authorization is terminated  or revoked sooner.       Influenza A by PCR NEGATIVE NEGATIVE   Influenza B by PCR NEGATIVE NEGATIVE    Comment: (NOTE) The Xpert Xpress SARS-CoV-2/FLU/RSV plus assay is intended as an aid in the diagnosis of influenza from Nasopharyngeal swab specimens and should not be used as a sole basis for treatment. Nasal washings and aspirates are unacceptable for Xpert Xpress SARS-CoV-2/FLU/RSV testing.  Fact Sheet for Patients: https://www.fda.gov/media/152166/download  Fact Sheet for Healthcare Providers: https://www.fda.gov/media/152162/download  This test is not yet approved or cleared   by the [place] FDA and has been authorized for detection and/or diagnosis of SARS-CoV-2 by FDA under an Emergency Use Authorization (EUA). This EUA will remain in effect (meaning this test can be used) for the duration of the COVID-19 declaration under Section 564(b)(1) of the Act, 21 U.S.C. section 360bbb-3(b)(1), unless the authorization is terminated or revoked.  Performed at [facility] [place], [address] [place], [place] [address]   Lactic acid, plasma     Status: None   Collection Time: 12/02/20  8:41 PM  Result Value Ref Range   Lactic Acid, Venous 1.1 0.5 - 1.9 mmol/L    Comment: Performed at [facility] [place], [address] [place], [place] [address]  CBG monitoring, ED     Status: Abnormal   Collection Time: 12/02/20  9:35 PM  Result Value Ref Range   Glucose-Capillary 410 (H) 70 - 99 mg/dL    Comment: Glucose reference range applies only to samples taken after fasting for at least 8 hours.  Glucose, capillary     Status: Abnormal   Collection Time: 12/02/20 10:44 PM  Result Value Ref Range   Glucose-Capillary 326 (H) 70 - 99 mg/dL    Comment: Glucose reference range applies only to samples taken after fasting for at least 8 hours.   Comment 1 Notify RN    Comment 2 Document in Chart   MRSA PCR Screening     Status:  None   Collection Time: 12/02/20 10:49 PM  Result Value Ref Range   MRSA by PCR NEGATIVE NEGATIVE    Comment:        The GeneXpert MRSA Assay (FDA approved for NASAL specimens only), is one component of a comprehensive MRSA colonization surveillance program. It is not intended to diagnose MRSA infection nor to guide or monitor treatment for MRSA infections. Performed at [facility], [address] [place], [place] [address]   Hemoglobin A1c     Status: Abnormal   Collection Time: 12/02/20 11:47 PM  Result Value Ref Range   Hgb A1c MFr Bld 13.1 (H) 4.8 - 5.6 %    Comment: (NOTE)         Prediabetes: 5.7 - 6.4         Diabetes: >6.4         Glycemic control for adults with diabetes: <7.0    Mean Plasma Glucose 329 mg/dL    Comment: (NOTE) Performed At: [facility] [address] [place], [place] [id number] Nagendra Sanjai MD Ph:[phone]   HIV Antibody (routine testing w rflx)     Status: None   Collection Time: 12/02/20 11:47 PM  Result Value Ref Range   HIV Screen 4th Generation wRfx Non Reactive Non Reactive    Comment: Performed at  [facility], [address] [place], [place] [address]  CBC     Status: Abnormal   Collection Time: 12/02/20 11:47 PM  Result Value Ref Range   WBC 13.5 (H) 4.0 - 10.5 K/uL   RBC 4.06 3.87 - 5.11 MIL/uL   Hemoglobin 9.9 (L) 12.0 - 15.0 g/dL   HCT 30.7 (L) 36.0 - 46.0 %   MCV 75.6 (L) 80.0 - 100.0 fL   MCH 24.4 (L) 26.0 - 34.0 pg   MCHC 32.2 30.0 - 36.0 g/dL   RDW 16.0 (H) 11.5 - 15.5 %   Platelets 235 150 - 400 K/uL   nRBC 0.0 0.0 - 0.2 %    Comment:   Performed at [facility], [address] [place], [place] [address]  Basic metabolic panel     Status: Abnormal   Collection Time: 12/02/20 11:47 PM  Result Value Ref Range   Sodium 137 135 - 145 mmol/L   Potassium 3.3 (L) 3.5 - 5.1 mmol/L   Chloride 101 98 - 111 mmol/L   CO2 27 22 - 32 mmol/L   Glucose, Bld 239 (H) 70 - 99  mg/dL    Comment: Glucose reference range applies only to samples taken after fasting for at least 8 hours.   BUN 11 6 - 20 mg/dL   Creatinine, Ser 0.71 0.44 - 1.00 mg/dL   Calcium 8.4 (L) 8.9 - 10.3 mg/dL   GFR, Estimated >60 >60 mL/min    Comment: (NOTE) Calculated using the CKD-EPI Creatinine Equation (2021)    Anion gap 9 5 - 15    Comment: Performed at [facility], [address] [place], [place] [address]  Glucose, capillary     Status: Abnormal   Collection Time: 12/02/20 11:47 PM  Result Value Ref Range   Glucose-Capillary 249 (H) 70 - 99 mg/dL    Comment: Glucose reference range applies only to samples taken after fasting for at least 8 hours.  Comprehensive metabolic panel     Status: Abnormal   Collection Time: 12/03/20 12:31 AM  Result Value Ref Range   Sodium 137 135 - 145 mmol/L   Potassium 3.3 (L) 3.5 - 5.1 mmol/L   Chloride 100 98 - 111 mmol/L   CO2 27 22 - 32 mmol/L   Glucose, Bld 236 (H) 70 - 99 mg/dL    Comment: Glucose reference range applies only to samples taken after fasting for at least 8 hours.   BUN 11 6 - 20 mg/dL   Creatinine, Ser 0.74 0.44 - 1.00 mg/dL   Calcium 8.4 (L) 8.9 - 10.3 mg/dL   Total Protein 6.8 6.5 - 8.1 g/dL   Albumin 2.6 (L) 3.5 - 5.0 g/dL   AST 9 (L) 15 - 41 U/L   ALT 12 0 - 44 U/L   Alkaline Phosphatase 99 38 - 126 U/L   Total Bilirubin 0.3 0.3 - 1.2 mg/dL   GFR, Estimated >60 >60 mL/min    Comment: (NOTE) Calculated using the CKD-EPI Creatinine Equation (2021)    Anion gap 10 5 - 15    Comment: Performed at [facility], [address] [place], [place] [address]  CBC     Status: Abnormal   Collection Time: 12/03/20 12:31 AM  Result Value Ref Range   WBC 13.7 (H) 4.0 - 10.5 K/uL   RBC 3.83 (L) 3.87 - 5.11 MIL/uL   Hemoglobin 9.5 (L) 12.0 - 15.0 g/dL   HCT 29.1 (L) 36.0 - 46.0 %   MCV 76.0 (L) 80.0 - 100.0 fL   MCH 24.8 (L) 26.0 - 34.0 pg   MCHC 32.6 30.0 - 36.0 g/dL   RDW 16.1 (H)  11.5 - 15.5 %   Platelets 246 150 - 400 K/uL   nRBC 0.0 0.0 - 0.2 %    Comment: Performed at [facility], [address] [place], [place] [address]  Glucose, capillary     Status: Abnormal   Collection Time: 12/03/20 12:46 AM  Result Value Ref Range   Glucose-Capillary 168 (H) 70 - 99 mg/dL    Comment: Glucose reference range applies only to samples taken after fasting for at least 8 hours.    Glucose, capillary     Status: Abnormal   Collection Time: 12/03/20  1:50 AM  Result Value Ref Range   Glucose-Capillary 219 (H) 70 - 99 mg/dL    Comment: Glucose reference range applies only to samples taken after fasting for at least 8 hours.  Glucose, capillary     Status: Abnormal   Collection Time: 12/03/20  2:48 AM  Result Value Ref Range   Glucose-Capillary 204 (H) 70 - 99 mg/dL    Comment: Glucose reference range applies only to samples taken after fasting for at least 8 hours.   Comment 1 Notify RN    Comment 2 Document in Chart   Glucose, capillary     Status: Abnormal   Collection Time: 12/03/20  3:49 AM  Result Value Ref Range   Glucose-Capillary 170 (H) 70 - 99 mg/dL    Comment: Glucose reference range applies only to samples taken after fasting for at least 8 hours.  Glucose, capillary     Status: Abnormal   Collection Time: 12/03/20  4:41 AM  Result Value Ref Range   Glucose-Capillary 142 (H) 70 - 99 mg/dL    Comment: Glucose reference range applies only to samples taken after fasting for at least 8 hours.  Glucose, capillary     Status: Abnormal   Collection Time: 12/03/20  6:00 AM  Result Value Ref Range   Glucose-Capillary 121 (H) 70 - 99 mg/dL    Comment: Glucose reference range applies only to samples taken after fasting for at least 8 hours.  Glucose, capillary     Status: Abnormal   Collection Time: 12/03/20  6:55 AM  Result Value Ref Range   Glucose-Capillary 136 (H) 70 - 99 mg/dL    Comment: Glucose reference range applies only to samples taken  after fasting for at least 8 hours.  Basic metabolic panel     Status: Abnormal   Collection Time: 12/03/20  7:29 AM  Result Value Ref Range   Sodium 137 135 - 145 mmol/L   Potassium 3.4 (L) 3.5 - 5.1 mmol/L   Chloride 105 98 - 111 mmol/L   CO2 28 22 - 32 mmol/L   Glucose, Bld 137 (H) 70 - 99 mg/dL    Comment: Glucose reference range applies only to samples taken after fasting for at least 8 hours.   BUN 11 6 - 20 mg/dL   Creatinine, Ser 0.82 0.44 - 1.00 mg/dL   Calcium 8.0 (L) 8.9 - 10.3 mg/dL   GFR, Estimated >60 >60 mL/min    Comment: (NOTE) Calculated using the CKD-EPI Creatinine Equation (2021)    Anion gap 4 (L) 5 - 15    Comment: Performed at [facility], [address] [place], [place] [address]  Glucose, capillary     Status: Abnormal   Collection Time: 12/03/20  7:52 AM  Result Value Ref Range   Glucose-Capillary 137 (H) 70 - 99 mg/dL    Comment: Glucose reference range applies only to samples taken after fasting for at least 8 hours.  Glucose, capillary     Status: Abnormal   Collection Time: 12/03/20  9:29 AM  Result Value Ref Range   Glucose-Capillary 130 (H) 70 - 99 mg/dL    Comment: Glucose reference range applies only to samples taken after fasting for at least 8 hours.  Basic metabolic panel     Status: Abnormal   Collection Time: 12/03/20 11:20 AM  Result Value Ref Range   Sodium 138 135 -   145 mmol/L   Potassium 4.0 3.5 - 5.1 mmol/L   Chloride 103 98 - 111 mmol/L   CO2 27 22 - 32 mmol/L   Glucose, Bld 121 (H) 70 - 99 mg/dL    Comment: Glucose reference range applies only to samples taken after fasting for at least 8 hours.   BUN 11 6 - 20 mg/dL   Creatinine, Ser 0.56 0.44 - 1.00 mg/dL   Calcium 8.4 (L) 8.9 - 10.3 mg/dL   GFR, Estimated >60 >60 mL/min    Comment: (NOTE) Calculated using the CKD-EPI Creatinine Equation (2021)    Anion gap 8 5 - 15    Comment: Performed at [facility], [address]  [place], [place] [address]  Glucose, capillary     Status: Abnormal   Collection Time: 12/03/20 12:01 PM  Result Value Ref Range   Glucose-Capillary 108 (H) 70 - 99 mg/dL    Comment: Glucose reference range applies only to samples taken after fasting for at least 8 hours.  Basic metabolic panel     Status: Abnormal   Collection Time: 12/03/20  3:02 PM  Result Value Ref Range   Sodium 137 135 - 145 mmol/L   Potassium 4.2 3.5 - 5.1 mmol/L   Chloride 103 98 - 111 mmol/L   CO2 24 22 - 32 mmol/L   Glucose, Bld 166 (H) 70 - 99 mg/dL    Comment: Glucose reference range applies only to samples taken after fasting for at least 8 hours.   BUN 10 6 - 20 mg/dL   Creatinine, Ser 0.80 0.44 - 1.00 mg/dL   Calcium 8.3 (L) 8.9 - 10.3 mg/dL   GFR, Estimated >60 >60 mL/min    Comment: (NOTE) Calculated using the CKD-EPI Creatinine Equation (2021)    Anion gap 10 5 - 15    Comment: Performed at [facility], [address] [place], [place] [address]  Glucose, capillary     Status: Abnormal   Collection Time: 12/03/20  6:05 PM  Result Value Ref Range   Glucose-Capillary 191 (H) 70 - 99 mg/dL    Comment: Glucose reference range applies only to samples taken after fasting for at least 8 hours.  Glucose, capillary     Status: Abnormal   Collection Time: 12/03/20  8:54 PM  Result Value Ref Range   Glucose-Capillary 228 (H) 70 - 99 mg/dL    Comment: Glucose reference range applies only to samples taken after fasting for at least 8 hours.  Vitamin B12     Status: None   Collection Time: 12/04/20  3:33 AM  Result Value Ref Range   Vitamin B-12 335 180 - 914 pg/mL    Comment: (NOTE) This assay is not validated for testing neonatal or myeloproliferative syndrome specimens for Vitamin B12 levels. Performed at [facility], [address] [place], [place] [address]   Folate     Status: None   Collection Time: 12/04/20  3:33 AM  Result Value Ref Range   Folate 9.3 >5.9  ng/mL    Comment: [facility] at  [facility], [address] [place], [place] [address]  Iron and TIBC     Status: Abnormal   Collection Time: 12/04/20  3:33 AM  Result Value Ref Range   Iron 15 (L) 28 - 170 ug/dL   TIBC 209 (L) 250 - 450 ug/dL   Saturation Ratios 7 (L) 10.4 - 31.8 %   UIBC 194 ug/dL      Comment: Performed at [facility], [address] [place], [place] [address]  Ferritin     Status: None   Collection Time: 12/04/20  3:33 AM  Result Value Ref Range   Ferritin 196 11 - 307 ng/mL    Comment: Performed at [facility], [address] [place], [place] [address]  Reticulocytes     Status: Abnormal   Collection Time: 12/04/20  3:33 AM  Result Value Ref Range   Retic Ct Pct 0.7 0.4 - 3.1 %   RBC. 4.05 3.87 - 5.11 MIL/uL   Retic Count, Absolute 29.6 19.0 - 186.0 K/uL   Immature Retic Fract 21.1 (H) 2.3 - 15.9 %    Comment: Performed at [facility], [address] [place], [place] [address]  Glucose, capillary     Status: Abnormal   Collection Time: 12/04/20  7:41 AM  Result Value Ref Range   Glucose-Capillary 260 (H) 70 - 99 mg/dL    Comment: Glucose reference range applies only to samples taken after fasting for at least 8 hours.  CBC     Status: Abnormal   Collection Time: 12/04/20  8:01 AM  Result Value Ref Range   WBC 10.3 4.0 - 10.5 K/uL   RBC 4.15 3.87 - 5.11 MIL/uL   Hemoglobin 10.1 (L) 12.0 - 15.0 g/dL   HCT 31.6 (L) 36.0 - 46.0 %   MCV 76.1 (L) 80.0 - 100.0 fL   MCH 24.3 (L) 26.0 - 34.0 pg   MCHC 32.0 30.0 - 36.0 g/dL   RDW 16.2 (H) 11.5 - 15.5 %   Platelets 274 150 - 400 K/uL   nRBC 0.0 0.0 - 0.2 %    Comment: Performed at [facility], [address] [place], [place] [address]  Basic metabolic panel     Status: Abnormal   Collection Time: 12/04/20  8:01 AM  Result Value Ref Range   Sodium 136 135 - 145 mmol/L   Potassium 4.3 3.5 - 5.1 mmol/L    Chloride 105 98 - 111 mmol/L   CO2 25 22 - 32 mmol/L   Glucose, Bld 271 (H) 70 - 99 mg/dL    Comment: Glucose reference range applies only to samples taken after fasting for at least 8 hours.   BUN 10 6 - 20 mg/dL   Creatinine, Ser 0.59 0.44 - 1.00 mg/dL   Calcium 8.3 (L) 8.9 - 10.3 mg/dL   GFR, Estimated >60 >60 mL/min    Comment: (NOTE) Calculated using the CKD-EPI Creatinine Equation (2021)    Anion gap 6 5 - 15    Comment: Performed at [facility], [address] [place], [place] [address]    MR FOOT RIGHT WO CONTRAST  Result Date: 12/03/2020 CLINICAL DATA:  Acute foot pain.  Infection suspected EXAM: MRI OF THE RIGHT FOREFOOT WITHOUT CONTRAST TECHNIQUE: Multiplanar, multisequence MR imaging of the right forefoot was performed. No intravenous contrast was administered. COMPARISON:  X-ray 12/02/2020 FINDINGS: Bones/Joint/Cartilage Bone marrow edema throughout the proximal phalanx of the right second toe with intermediate T1 marrow signal compatible with acute osteomyelitis (series 6, images 11-13). Bone marrow edema within the middle phalanx of the second toe with preservation of the fatty T1 bone marrow signal. Preserved signal within the second metatarsal head. Remaining osseous structures are within normal limits. No fracture or dislocation. Small joint effusion at the first MTP joint, nonspecific. Ligaments Intact Lisfranc ligament. Collateral ligaments of the forefoot appear intact.   Muscles and Tendons Diffuse intramuscular edema suggesting myositis. Soft tissues Fluid and air containing collection predominantly located within the dorsal soft tissues of the forefoot extending to the base of the second toe measuring approximately 7.7 x 1.7 x 2.9 cm (series 5, image 11; series 4, image 19). Fluid collection extends into the first intermetatarsal space (series 4, image 20). Diffuse subcutaneous edema throughout the foot. Small collections of fluid noted within the cutaneous  aspect of the dorsal forefoot suggest sites of skin blistering. IMPRESSION: 1. Acute osteomyelitis of the proximal phalanx of the right second toe. 2. Bone marrow edema within the middle phalanx of the second toe may represent reactive osteitis versus early acute osteomyelitis. 3. Fluid and air containing collection predominantly located within the dorsal soft tissues of the forefoot extending to the base of the second toe measuring up to 7.7 x 1.7 x 2.9 cm compatible with abscess. Fluid collection extends into the first intermetatarsal space. 4. Small first MTP joint effusion which may be reactive or represent septic arthritis. 5. Diffuse intramuscular edema suggesting myositis. Electronically Signed   By: Nicholas  Plundo D.O.   On: 12/03/2020 20:57   DG Foot Complete Right  Result Date: 12/02/2020 CLINICAL DATA:  Foot ulcer.  Hyperglycemia EXAM: RIGHT FOOT COMPLETE - 3+ VIEW COMPARISON:  None. FINDINGS: No evidence of acute fracture or dislocation in the right foot. No focal bone lesion or cortical changes to suggest osteomyelitis. 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 Mild dorsal soft tissue swelling with soft tissue gas demonstrated over the dorsum of the forefoot. This suggests cellulitis with gas-forming organism. No radiopaque soft tissue foreign bodies. IMPRESSION: No acute fracture or dislocation. Soft tissue swelling with soft tissue gas consistent with cellulitis. No radiographic changes of osteomyelitis. Electronically Signed   By: William  Stevens M.D.   On: 12/02/2020 18:02    ROS recent sign sinus tachycardia related to infection.  Positive for neuropathy of her feet.  Infection drainage right second toe.  Positive for insulin-dependent diabetes.  Neurologic  otherwise negative.  All other systems noncontributory. Blood pressure 138/82, pulse 99, temperature 98.7 F (37.1 C), temperature source Oral, resp. rate 18, height 5' 11" (1.803  m), weight 98.9 kg, SpO2 93 %. Physical Exam HENT:     Head: Normocephalic.     Right Ear: External ear normal.     Left Ear: External ear normal.  Eyes:     Extraocular Movements: Extraocular movements intact.  Cardiovascular:     Rate and Rhythm: Tachycardia present.  Pulmonary:     Effort: Pulmonary effort is normal.     Breath sounds: No wheezing.  Musculoskeletal:     Cervical back: Normal range of motion.  Skin:    Capillary Refill: Capillary refill takes less than 2 seconds.  Neurological:     Mental Status: She is alert.  Psychiatric:        Mood and Affect: Mood normal.        Behavior: Behavior normal.        Thought Content: Thought content normal.    Assessment/Plan: Patient with poor diabetic control despite insulin pump.  A1c is greater than 13.  She has osteomyelitis right second toe will require toe amputation as well as washout of dorsal abscess and placement of VAC.  She will need at least couple days of IV antibiotics and then more specific antibiotic therapy treatment based on cultures which sometimes are multi organism.  Discussed possibility of   a second procedure would be needed but usually 1 procedure should take care of it.  Long discussion with patient about improved diabetic compliance and care monitoring her sugars better, watching her diet etc.  She understands that if she does not improve her diabetic control she is likely to have further foot surgeries and amputations.  Questions was then answered.  We reviewed her MRI scan as well as report, reviewed plain radiographs as well as report and discussed outlined treatment plan.  Lesia Monica C Roxanne Orner 12/04/2020, 9:34 AM      

## 2020-12-04 NOTE — Op Note (Addendum)
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  Postop diagnosis: Same  Procedure: Sharp excisional debridement of dorsal right forefoot abscess and second toe amputation.  Surgeon: Annell Greening, MD  Tourniquet time: Less than 20 minutes.  Brief history: 50 year old diabetic insulin-dependent times greater than 20years.  She had switched insurance and for time did not have availability of her insulin pump.  A1c out-of-control greater than 13 with small ulcer on the dorsum of her second toe PIP joint which progressed to an abscess with second toe proximal phalanx osteomyelitis.  MRI scan confirmed osteomyelitis and dorsal abscess over the metatarsal head region requiring surgical intervention.  After sterile prepping and draping with patient supine position Tourniquet was applied DuraPrep usual extremity sheets and drapes were applied.  Patient had blisters over the second metatarsal head extending partially over the third to the webspace with soft tissue swelling.  Tourniquet was inflated without wrapping with an Esmarch and timeout procedures been completed.  Reconciled incision was made over the base of the toe and splitting over the dorsum into the abscess purulent material was obtained and aerobic and anaerobic cultures were obtained.  Incision  had to be extended 1 cm proximal to the metatarsal phalangeal joint since there was purulent material surrounding extensor tendons with necrosis which was excised sharply with pickups and scalpel.  Toe was amputated through the metatarsal phalangeal joint and then extensive further sharp excisional debridement with pickups 10 scalpel blade, 15 scalpel blade and bare rondure was used with intermittent irrigation until tissue was debrided back to more normal tissue.  Tourniquet was deflated there was still poor bleeding due to the patient's longstanding diabetes.  There was some skin  edge bleeding minimal.  Repeat copious irrigation.  Single 2-0 Vicryl was placed side to side distally with horizontal mattress suture and then a small piece of sponge was applied to the small VAC having to cover the toes for good seal.  Pump was not 125.  This excisional debridement was the ultimate debridement since it was an amputation  of the 2nd toe and skin subcutaneous tissue bones and ligaments were all excised with the amputated  right 2nd toe.

## 2020-12-04 NOTE — Anesthesia Procedure Notes (Signed)
Procedure Name: LMA Insertion Date/Time: 12/04/2020 2:58 PM Performed by: Epimenio Sarin, CRNA Pre-anesthesia Checklist: Patient identified, Emergency Drugs available, Suction available, Patient being monitored and Timeout performed Patient Re-evaluated:Patient Re-evaluated prior to induction Oxygen Delivery Method: Circle system utilized Preoxygenation: Pre-oxygenation with 100% oxygen Induction Type: IV induction Ventilation: Mask ventilation without difficulty LMA: LMA with gastric port inserted LMA Size: 4.0 Number of attempts: 1 Dental Injury: Teeth and Oropharynx as per pre-operative assessment

## 2020-12-05 LAB — CBC
HCT: 29 % — ABNORMAL LOW (ref 36.0–46.0)
Hemoglobin: 9.4 g/dL — ABNORMAL LOW (ref 12.0–15.0)
MCH: 24.7 pg — ABNORMAL LOW (ref 26.0–34.0)
MCHC: 32.4 g/dL (ref 30.0–36.0)
MCV: 76.3 fL — ABNORMAL LOW (ref 80.0–100.0)
Platelets: 298 10*3/uL (ref 150–400)
RBC: 3.8 MIL/uL — ABNORMAL LOW (ref 3.87–5.11)
RDW: 16.2 % — ABNORMAL HIGH (ref 11.5–15.5)
WBC: 11.1 10*3/uL — ABNORMAL HIGH (ref 4.0–10.5)
nRBC: 0 % (ref 0.0–0.2)

## 2020-12-05 LAB — GLUCOSE, CAPILLARY
Glucose-Capillary: 168 mg/dL — ABNORMAL HIGH (ref 70–99)
Glucose-Capillary: 210 mg/dL — ABNORMAL HIGH (ref 70–99)
Glucose-Capillary: 245 mg/dL — ABNORMAL HIGH (ref 70–99)
Glucose-Capillary: 247 mg/dL — ABNORMAL HIGH (ref 70–99)

## 2020-12-05 LAB — BASIC METABOLIC PANEL
Anion gap: 6 (ref 5–15)
BUN: 7 mg/dL (ref 6–20)
CO2: 27 mmol/L (ref 22–32)
Calcium: 8.4 mg/dL — ABNORMAL LOW (ref 8.9–10.3)
Chloride: 104 mmol/L (ref 98–111)
Creatinine, Ser: 0.51 mg/dL (ref 0.44–1.00)
GFR, Estimated: >60 mL/min (ref >60)
Glucose, Bld: 141 mg/dL — ABNORMAL HIGH (ref 70–99)
Potassium: 3.9 mmol/L (ref 3.5–5.1)
Sodium: 137 mmol/L (ref 135–145)

## 2020-12-05 MED ORDER — POLYETHYLENE GLYCOL 3350 17 G PO PACK
17.0000 g | PACK | Freq: Every day | ORAL | Status: DC
  Administered 2020-12-05 – 2020-12-06 (×2): 17 g via ORAL
  Filled 2020-12-05 (×2): qty 1

## 2020-12-05 MED ORDER — SENNOSIDES-DOCUSATE SODIUM 8.6-50 MG PO TABS
1.0000 | ORAL_TABLET | Freq: Two times a day (BID) | ORAL | Status: DC
  Administered 2020-12-05 – 2020-12-09 (×9): 1 via ORAL
  Filled 2020-12-05 (×10): qty 1

## 2020-12-05 NOTE — Progress Notes (Signed)
   Subjective: 1 Day Post-Op Procedure(s) (LRB): AMPUTATION RIGHT SECOND TOE, I&D FOOT (Right) Patient reports pain as moderate.  Foot up on only one pillow. Bed trendelberg to elevate above heart and cut pain by 50 %  Objective: Vital signs in last 24 hours: Temp:  [97.4 F (36.3 C)-99 F (37.2 C)] 98.2 F (36.8 C) (06/19 0557) Pulse Rate:  [80-95] 83 (06/19 0557) Resp:  [16-20] 16 (06/19 0557) BP: (109-142)/(68-86) 116/68 (06/19 0557) SpO2:  [92 %-100 %] 97 % (06/19 0557)  Intake/Output from previous day: 06/18 0701 - 06/19 0700 In: 2983.7 [P.O.:960; I.V.:1516.7; IV Piggyback:507] Out: 10 [Blood:10] Intake/Output this shift: Total I/O In: 100 [IV Piggyback:100] Out: 900 [Urine:900]  Recent Labs    12/02/20 1906 12/02/20 2347 12/03/20 0031 12/04/20 0801 12/05/20 0355  HGB 11.3* 9.9* 9.5* 10.1* 9.4*   Recent Labs    12/04/20 0801 12/05/20 0355  WBC 10.3 11.1*  RBC 4.15 3.80*  HCT 31.6* 29.0*  PLT 274 298   Recent Labs    12/04/20 0801 12/05/20 0355  NA 136 137  K 4.3 3.9  CL 105 104  CO2 25 27  BUN 10 7  CREATININE 0.59 0.51  GLUCOSE 271* 141*  CALCIUM 8.3* 8.4*   No results for input(s): LABPT, INR in the last 72 hours.  VAC minimal output.  No results found.  Assessment/Plan: 1 Day Post-Op Procedure(s) (LRB): AMPUTATION RIGHT SECOND TOE, I&D FOOT (Right) Up with therapy, post op shoe ordered. VAC change tomorrow.   Culture Gram positive.   Eldred Manges 12/05/2020, 10:01 AM

## 2020-12-05 NOTE — Progress Notes (Signed)
Pharmacy Antibiotic Note  Valerie Le is a 50 y.o. female admitted on 12/02/2020 with past medical history of diabetes, hypertension, high cholesterol, presents to the emergency department with wound to the top of the right foot  Pharmacy has been consulted to dose vancomycin and cefepime for wound infection.  Day 3 full Abx - vanc/cefepime now s/p debridement of abscess and toe amputation on 6/18 Afebrile WBC 11.1 pretty stable SCr stable Wound Cx from 6/18 pending  Plan: Continue vanc 750mg  q8h (AUC 519.1, Scr 0.75) Continue cefepime 2g IV q8h Follow renal function and clinical course What is plan for Abx's? Would change to PO as soon as appropriate, maybe doxy and/or augmentin depending on wound cx results or ortho feels confident that infection was cleared may be able to stop abx's  Height: 5\' 11"  (180.3 cm) Weight: 98.9 kg (218 lb) IBW/kg (Calculated) : 70.8  Temp (24hrs), Avg:98.4 F (36.9 C), Min:97.4 F (36.3 C), Max:99 F (37.2 C)  Recent Labs  Lab 12/02/20 1906 12/02/20 2041 12/02/20 2347 12/03/20 0031 12/03/20 0729 12/03/20 1120 12/03/20 1502 12/04/20 0801 12/05/20 0355  WBC 15.0*  --  13.5* 13.7*  --   --   --  10.3 11.1*  CREATININE 0.75  --  0.71 0.74 0.82 0.56 0.80 0.59 0.51  LATICACIDVEN 1.1 1.1  --   --   --   --   --   --   --      Estimated Creatinine Clearance: 108.9 mL/min (by C-G formula based on SCr of 0.51 mg/dL).    Allergies  Allergen Reactions   Coconut Oil Anaphylaxis   Pineapple Anaphylaxis   Tomato Anaphylaxis   Latex Hives   Metformin And Related Other (See Comments)    Constipation/cramps    12/06/20, PharmD, BCPS Secure Chat if ?s 12/05/2020 9:11 AM

## 2020-12-05 NOTE — Plan of Care (Signed)
  Problem: Education: Goal: Knowledge of General Education information will improve Description Including pain rating scale, medication(s)/side effects and non-pharmacologic comfort measures Outcome: Progressing   

## 2020-12-05 NOTE — Progress Notes (Signed)
PROGRESS NOTE    Valerie Le  NWG:[id number] DOB: 06-28-1970 DOA: 12/02/2020 PCP: Maud Deed, PA   Brief Narrative: 50 year old with past medical history significant for insulin-dependent diabetes on insulin pump, run out of insulin in her pump, unable to get it until July 1, history of hypertension, morbid obesity who presents to med [facility] complaining of worsening wound of her right foot.  Patient follows at [facility] wound center, her wound is started in December, has a small ulceration on the top of her foot.  Worsening swelling of her foot and ankle today of admission.  Her blood sugars are recommended to be in the 500 range.  Evaluation in the ED: Patient was noted to be tachycardic heart rate 116 tachypneic respiration rate 27.  Sodium 130, potassium 3.5 CO2 28, glucose 509.  Lactic acid 1.1, white blood cell 15.  X-ray of the right foot showed no fracture or dislocation,  swelling with soft tissue gas consistent with cellulitis.   Assessment & Plan:   Principal Problem:   Diabetic foot infection (HCC) Active Problems:   Essential hypertension   Insulin-treated type 2 diabetes mellitus (HCC)   Asthma   ADHD (attention deficit hyperactivity disorder)   Bipolar affective disorder, currently depressed, moderate (HCC)   Morbid obesity due to excess calories (HCC)   Migraine   Tobacco abuse   Hyperlipidemia associated with type 2 diabetes mellitus (HCC)   1-Hyperosmolar, hyperglycemic state -Patient presented with hyperglycemia blood sugar 500, anion gap normal, CO2 28. -He was treated with insulin drip and IV fluids. -She was transition to Lantus and a sliding scale insulin. -Continue with Lantus to 25 units.   2-Diabetic foot infection Osteomyelitis of the proximal phalanx of the right second toe.  -Continue with IV vancomycin and cefepime. -X-Ray Consistent with cellulitis. -MRI right foot; Acute osteomyelitis of the proximal phalanx of the right  second toe. Bone marrow edema within the middle phalanx of the second toe may represent reactive osteitis versus early acute osteomyelitis. Fluid and air containing collection predominantly located within the dorsal soft tissues of the forefoot extending to the base of the second toe measuring up to 7.7 x 1.7 x 2.9 cm compatible with abscess. Fluid collection extends into the first intermetatarsal space. Small first MTP joint effusion which may be reactive or represent septic arthritis. Diffuse intramuscular edema suggesting myositis. Plan for OR today, toe amputation and drainage of abscess.  Dr Ophelia Charter consulted.  Underwent debridement of dorsal left forefoot abscess and second toe amputation. With wound VAC.  Per Dr. Ophelia Charter  plan to change wound VAC tomorrow. Culture : gram-positive cocci  3-Hypertension: Continue with Norvasc and  lisinopril.   4-Hyperlipidemia: Continue with lipitor.   5-Morbid obesity: Needs life style modification.  Anemia; iron deficiency. Start iron when infection is controlled.   Tobacco abuse: counseled.   History of migraine: On Sumatriptan.      Estimated body mass index is 30.4 kg/m as calculated from the following:   Height as of this encounter: 5\' 11"  (1.803 m).   Weight as of this encounter: 98.9 kg.   DVT prophylaxis: Lovenox Code Status: Full code Family Communication: care discussed with patient.  Disposition Plan:  Status is: Inpatient  Remains inpatient appropriate because:Hemodynamically unstable  Dispo: The patient is from: Home              Anticipated d/c is to: Home              Patient currently is  not medically stable to d/c.   Difficult to place patient No        Consultants:  Dr Ophelia Charter  Procedures:  None  Antimicrobials:    Subjective: Complaints of pain,  She will received her insulin pump supplies soon.   Objective: Vitals:   12/04/20 1814 12/04/20 2018 12/05/20 0143 12/05/20 0557  BP: 133/82 131/83 128/68  116/68  Pulse: 95 85 91 83  Resp: 20 16 16 16   Temp: 98.6 F (37 C) 98.9 F (37.2 C) 99 F (37.2 C) 98.2 F (36.8 C)  TempSrc: Oral Oral Oral Oral  SpO2: 92% 100% 97% 97%  Weight:      Height:        Intake/Output Summary (Last 24 hours) at 12/05/2020 0716 Last data filed at 12/05/2020 0600 Gross per 24 hour  Intake 2983.7 ml  Output 10 ml  Net 2973.7 ml    Filed Weights   12/02/20 1708 12/02/20 2254  Weight: 100.2 kg 98.9 kg    Examination:  General exam: NAD Respiratory system: CTA Cardiovascular system: S 1, S 2 RRR Gastrointestinal system: BS present, soft, nt Central nervous system: Alert Extremities: Symmetric 5 x 5 power. Right foot wound VAC in place   Data Reviewed: I have personally reviewed following labs and imaging studies  CBC: Recent Labs  Lab 12/02/20 1906 12/02/20 2347 12/03/20 0031 12/04/20 0801 12/05/20 0355  WBC 15.0* 13.5* 13.7* 10.3 11.1*  NEUTROABS 11.6*  --   --   --   --   HGB 11.3* 9.9* 9.5* 10.1* 9.4*  HCT 34.6* 30.7* 29.1* 31.6* 29.0*  MCV 74.1* 75.6* 76.0* 76.1* 76.3*  PLT 249 235 246 274 298    Basic Metabolic Panel: Recent Labs  Lab 12/03/20 0729 12/03/20 1120 12/03/20 1502 12/04/20 0801 12/05/20 0355  NA 137 138 137 136 137  K 3.4* 4.0 4.2 4.3 3.9  CL 105 103 103 105 104  CO2 28 27 24 25 27   GLUCOSE 137* 121* 166* 271* 141*  BUN 11 11 10 10 7   CREATININE 0.82 0.56 0.80 0.59 0.51  CALCIUM 8.0* 8.4* 8.3* 8.3* 8.4*    GFR: Estimated Creatinine Clearance: 108.9 mL/min (by C-G formula based on SCr of 0.51 mg/dL). Liver Function Tests: Recent Labs  Lab 12/02/20 1906 12/03/20 0031  AST 10* 9*  ALT 13 12  ALKPHOS 116 99  BILITOT 0.5 0.3  PROT 7.8 6.8  ALBUMIN 2.9* 2.6*    No results for input(s): LIPASE, AMYLASE in the last 168 hours. No results for input(s): AMMONIA in the last 168 hours. Coagulation Profile: No results for input(s): INR, PROTIME in the last 168 hours. Cardiac Enzymes: No results for  input(s): CKTOTAL, CKMB, CKMBINDEX, TROPONINI in the last 168 hours. BNP (last 3 results) No results for input(s): PROBNP in the last 8760 hours. HbA1C: Recent Labs    12/02/20 2347  HGBA1C 13.1*    CBG: Recent Labs  Lab 12/04/20 0741 12/04/20 1128 12/04/20 1543 12/04/20 1756 12/04/20 2137  GLUCAP 260* 213* 95 127* 131*    Lipid Profile: No results for input(s): CHOL, HDL, LDLCALC, TRIG, CHOLHDL, LDLDIRECT in the last 72 hours. Thyroid Function Tests: No results for input(s): TSH, T4TOTAL, FREET4, T3FREE, THYROIDAB in the last 72 hours. Anemia Panel: Recent Labs    12/04/20 0333  VITAMINB12 335  FOLATE 9.3  FERRITIN 196  TIBC 209*  IRON 15*  RETICCTPCT 0.7    Sepsis Labs: Recent Labs  Lab 12/02/20 1906 12/02/20 2041  LATICACIDVEN  1.1 1.1     Recent Results (from the past 240 hour(s))  Resp Panel by RT-PCR (Flu A&B, Covid) Nasopharyngeal Swab     Status: None   Collection Time: 12/02/20  7:06 PM   Specimen: Nasopharyngeal Swab; Nasopharyngeal(NP) swabs in vial transport medium  Result Value Ref Range Status   SARS Coronavirus 2 by RT PCR NEGATIVE NEGATIVE Final    Comment: (NOTE) SARS-CoV-2 target nucleic acids are NOT DETECTED.  The SARS-CoV-2 RNA is generally detectable in upper respiratory specimens during the acute phase of infection. The lowest concentration of SARS-CoV-2 viral copies this assay can detect is 138 copies/mL. A negative result does not preclude SARS-Cov-2 infection and should not be used as the sole basis for treatment or other patient management decisions. A negative result may occur with  improper specimen collection/handling, submission of specimen other than nasopharyngeal swab, presence of viral mutation(s) within the areas targeted by this assay, and inadequate number of viral copies(<138 copies/mL). A negative result must be combined with clinical observations, patient history, and epidemiological information. The expected  result is Negative.  Fact Sheet for Patients:  BloggerCourse.comhttps://www.fda.gov/media/152166/download  Fact Sheet for Healthcare Providers:  SeriousBroker.ithttps://www.fda.gov/media/152162/download  This test is no t yet approved or cleared by the [place] FDA and  has been authorized for detection and/or diagnosis of SARS-CoV-2 by FDA under an Emergency Use Authorization (EUA). This EUA will remain  in effect (meaning this test can be used) for the duration of the COVID-19 declaration under Section 564(b)(1) of the Act, 21 U.S.C.section 360bbb-3(b)(1), unless the authorization is terminated  or revoked sooner.       Influenza A by PCR NEGATIVE NEGATIVE Final   Influenza B by PCR NEGATIVE NEGATIVE Final    Comment: (NOTE) The Xpert Xpress SARS-CoV-2/FLU/RSV plus assay is intended as an aid in the diagnosis of influenza from Nasopharyngeal swab specimens and should not be used as a sole basis for treatment. Nasal washings and aspirates are unacceptable for Xpert Xpress SARS-CoV-2/FLU/RSV testing.  Fact Sheet for Patients: BloggerCourse.comhttps://www.fda.gov/media/152166/download  Fact Sheet for Healthcare Providers: SeriousBroker.ithttps://www.fda.gov/media/152162/download  This test is not yet approved or cleared by the [place] FDA and has been authorized for detection and/or diagnosis of SARS-CoV-2 by FDA under an Emergency Use Authorization (EUA). This EUA will remain in effect (meaning this test can be used) for the duration of the COVID-19 declaration under Section 564(b)(1) of the Act, 21 U.S.C. section 360bbb-3(b)(1), unless the authorization is terminated or revoked.  Performed at [facility] [place], [address] [place], [place] [address]   MRSA PCR Screening     Status: None   Collection Time: 12/02/20 10:49 PM  Result Value Ref Range Status   MRSA by PCR NEGATIVE NEGATIVE Final    Comment:        The GeneXpert MRSA Assay (FDA approved for NASAL specimens only), is one component of  a comprehensive MRSA colonization surveillance program. It is not intended to diagnose MRSA infection nor to guide or monitor treatment for MRSA infections. Performed at [facility], [address] [place], [place] [address]   Aerobic/Anaerobic Culture w Gram Stain (surgical/deep wound)     Status: None (Preliminary result)   Collection Time: 12/04/20  3:09 PM   Specimen: Wound  Result Value Ref Range Status   Specimen Description   Final    WOUND Performed at [facility], [address] [place], [place] [address]    Special Requests   Final  NONE Performed at [facility], [address] [place], [place] [address]    Gram Stain   Final    ABUNDANT WBC PRESENT,BOTH PMN AND MONONUCLEAR RARE GRAM POSITIVE COCCI Performed at [facility], [address], [place], [place] [address]    Culture PENDING  Incomplete   Report Status PENDING  Incomplete          Radiology Studies: MR FOOT RIGHT WO CONTRAST  Result Date: 12/03/2020 CLINICAL DATA:  Acute foot pain.  Infection suspected EXAM: MRI OF THE RIGHT FOREFOOT WITHOUT CONTRAST TECHNIQUE: Multiplanar, multisequence MR imaging of the right forefoot was performed. No intravenous contrast was administered. COMPARISON:  X-ray 12/02/2020 FINDINGS: Bones/Joint/Cartilage Bone marrow edema throughout the proximal phalanx of the right second toe with intermediate T1 marrow signal compatible with acute osteomyelitis (series 6, images 11-13). Bone marrow edema within the middle phalanx of the second toe with preservation of the fatty T1 bone marrow signal. Preserved signal within the second metatarsal head. Remaining osseous structures are within normal limits. No fracture or dislocation. Small joint effusion at the first MTP joint, nonspecific. Ligaments Intact Lisfranc ligament. Collateral ligaments of the forefoot appear intact. Muscles and Tendons Diffuse intramuscular  edema suggesting myositis. Soft tissues Fluid and air containing collection predominantly located within the dorsal soft tissues of the forefoot extending to the base of the second toe measuring approximately 7.7 x 1.7 x 2.9 cm (series 5, image 11; series 4, image 19). Fluid collection extends into the first intermetatarsal space (series 4, image 20). Diffuse subcutaneous edema throughout the foot. Small collections of fluid noted within the cutaneous aspect of the dorsal forefoot suggest sites of skin blistering. IMPRESSION: 1. Acute osteomyelitis of the proximal phalanx of the right second toe. 2. Bone marrow edema within the middle phalanx of the second toe may represent reactive osteitis versus early acute osteomyelitis. 3. Fluid and air containing collection predominantly located within the dorsal soft tissues of the forefoot extending to the base of the second toe measuring up to 7.7 x 1.7 x 2.9 cm compatible with abscess. Fluid collection extends into the first intermetatarsal space. 4. Small first MTP joint effusion which may be reactive or represent septic arthritis. 5. Diffuse intramuscular edema suggesting myositis. Electronically Signed   By: Duanne Guess D.O.   On: 12/03/2020 20:57        Scheduled Meds:  amLODipine  10 mg Oral Daily   aspirin EC  81 mg Oral Daily   atorvastatin  80 mg Oral QHS   Chlorhexidine Gluconate Cloth  6 each Topical Daily   cholecalciferol  1,000 Units Oral Daily   enoxaparin (LOVENOX) injection  40 mg Subcutaneous Q24H   insulin aspart  0-15 Units Subcutaneous TID WC   insulin aspart  0-5 Units Subcutaneous QHS   insulin aspart  4 Units Subcutaneous TID WC   insulin glargine  25 Units Subcutaneous Daily   lisinopril  40 mg Oral Daily   Continuous Infusions:  sodium chloride 100 mL/hr at 12/04/20 0939   sodium chloride Stopped (12/03/20 1016)   ceFEPime (MAXIPIME) IV 2 g (12/04/20 2354)   lactated ringers 50 mL/hr at 12/04/20 1825   vancomycin 750 mg  (12/05/20 0425)     LOS: 3 days    Time spent: 35 minutes.     Alba Cory, MD [facility]   If 7PM-7AM, please contact night-coverage [email or web address]  12/05/2020, 7:16 AM

## 2020-12-06 ENCOUNTER — Encounter (HOSPITAL_COMMUNITY): Payer: Self-pay | Admitting: Orthopaedic Surgery

## 2020-12-06 LAB — CBC
HCT: 32 % — ABNORMAL LOW (ref 36.0–46.0)
Hemoglobin: 10.4 g/dL — ABNORMAL LOW (ref 12.0–15.0)
MCH: 24.5 pg — ABNORMAL LOW (ref 26.0–34.0)
MCHC: 32.5 g/dL (ref 30.0–36.0)
MCV: 75.5 fL — ABNORMAL LOW (ref 80.0–100.0)
Platelets: 315 10*3/uL (ref 150–400)
RBC: 4.24 MIL/uL (ref 3.87–5.11)
RDW: 15.9 % — ABNORMAL HIGH (ref 11.5–15.5)
WBC: 11.2 10*3/uL — ABNORMAL HIGH (ref 4.0–10.5)
nRBC: 0 % (ref 0.0–0.2)

## 2020-12-06 LAB — BASIC METABOLIC PANEL
Anion gap: 6 (ref 5–15)
BUN: 7 mg/dL (ref 6–20)
CO2: 30 mmol/L (ref 22–32)
Calcium: 8.7 mg/dL — ABNORMAL LOW (ref 8.9–10.3)
Chloride: 99 mmol/L (ref 98–111)
Creatinine, Ser: 0.61 mg/dL (ref 0.44–1.00)
GFR, Estimated: >60 mL/min (ref >60)
Glucose, Bld: 298 mg/dL — ABNORMAL HIGH (ref 70–99)
Potassium: 4.3 mmol/L (ref 3.5–5.1)
Sodium: 135 mmol/L (ref 135–145)

## 2020-12-06 LAB — GLUCOSE, CAPILLARY
Glucose-Capillary: 270 mg/dL — ABNORMAL HIGH (ref 70–99)
Glucose-Capillary: 277 mg/dL — ABNORMAL HIGH (ref 70–99)
Glucose-Capillary: 199 mg/dL — ABNORMAL HIGH (ref 70–99)
Glucose-Capillary: 247 mg/dL — ABNORMAL HIGH (ref 70–99)

## 2020-12-06 MED ORDER — INSULIN GLARGINE 100 UNIT/ML ~~LOC~~ SOLN
5.0000 [IU] | Freq: Once | SUBCUTANEOUS | Status: AC
  Administered 2020-12-06: 5 [IU] via SUBCUTANEOUS
  Filled 2020-12-06: qty 0.05

## 2020-12-06 MED ORDER — INSULIN GLARGINE 100 UNIT/ML ~~LOC~~ SOLN
30.0000 [IU] | Freq: Every day | SUBCUTANEOUS | Status: DC
  Filled 2020-12-06: qty 0.3

## 2020-12-06 MED ORDER — INSULIN ASPART 100 UNIT/ML IJ SOLN
6.0000 [IU] | Freq: Three times a day (TID) | INTRAMUSCULAR | Status: DC
  Administered 2020-12-06 – 2020-12-09 (×12): 6 [IU] via SUBCUTANEOUS

## 2020-12-06 MED ORDER — FERROUS SULFATE 325 (65 FE) MG PO TABS
325.0000 mg | ORAL_TABLET | Freq: Every day | ORAL | Status: DC
  Administered 2020-12-07 – 2020-12-09 (×3): 325 mg via ORAL
  Filled 2020-12-06 (×3): qty 1

## 2020-12-06 NOTE — Progress Notes (Addendum)
Inpatient Diabetes Program Recommendations  AACE/ADA: New Consensus Statement on Inpatient Glycemic Control (2015)  Target Ranges:  Prepandial:   less than 140 mg/dL      Peak postprandial:   less than 180 mg/dL (1-2 hours)      Critically ill patients:  140 - 180 mg/dL   Results for Valerie Le, Valerie Le (MRN [id number]) as of 12/06/2020 08:14  Ref. Range 12/05/2020 07:38 12/05/2020 11:26 12/05/2020 16:17 12/05/2020 20:57  Glucose-Capillary Latest Ref Range: 70 - 99 mg/dL 168 (H)  7 units NOVOLOG _0 :18  25 units LANTUS _1 :18 210 (H)  9 units NOVOLOG  245 (H)  9 units NOVOLOG _2 :26 247 (H)  2 units NOVOLOG _3 :17   Results for Valerie Le, Valerie Le (MRN [id number]) as of 12/06/2020 08:14  Ref. Range 12/06/2020 07:39  Glucose-Capillary Latest Ref Range: 70 - 99 mg/dL 270 (H)     Home DM Meds: Toujeo 48 units qhs      Humalog 12-42 units tid (counts carbs for meal time) 1 unit for every 6 gram of carbs + SSI  T Slim insulin pump/ Dexcom G6 (supplies come in starting July 1st)   Current Orders: Lantus 25 units Daily         Novolog Moderate Correction Scale/ SSI (0-15 units) TID AC + HS      Novolog 6 units TID with meals    MD- Note that the Novolog Meal Coverage dose increased this AM from 4 units TID to 6 units TID  AM CBGs also remain elevated  Please consider increasing the Lantus to 30 units Daily  If 25 unit dose already given this AM, please also order Lantus 5 units X1 dose to be given today as well  --May also consider further increasing the Novolog Meal Coverage to 10 units TID with meals    Addendum 12pm--Met w/ pt at bedside to discuss current CBGs and plan to resume insulin pump at home after discharge.  Pt told me she had to stop her Tslim insulin pump over a month ago due to issues with getting Pump supplies through her insurance.  Has one Dexcom sensor at home and company is sending her a Production assistant, radio.  Has the Tslim pump at home and it is currently  programmed to her current settings.  Pt is waiting for insertion sites and other supplies.  Plan is to receive the supplies 12/17/2020.  Pt told me she plans to resume her pump when she gets the supplies and that her ENDO Dr. Hartford Poli knows of this plan as well.  We discussed how Toujeo has a length of action of somewhere between 30-36 hours and that when she resumes her pump she should wait to resume the basal rates on her pump until approximately 30 hours after last dose Toujeo given.  Pt can either start the pump and suspend the basal rates until the Touejo has left her system or she can wait all together to restart the pump after the Toujeo has left her system.  Pt understands and will be able to do this.     --Will follow patient during hospitalization--  Wyn Quaker RN, MSN, CDE Diabetes Coordinator Inpatient Glycemic Control Team Team Pager: [phone] (8a-5p)

## 2020-12-06 NOTE — Consult Note (Signed)
WOC Nurse Consult Note: Ortho team following for assessment and plan of care for post-op wound; secure chat message sent to Dr Ophelia Charter to discuss plan of care. Wound type: Full thickness surgical wound to right foot Measurement: 5X2X1cm Wound bed: beefy red, small amt blood-tinged drainage, edges are moist and macerated. Medicated for pain prior to the procedure and tolerated with minimal amt discomfort. Placed one piece black sponge into the wound, placed barrier ring around wound edges to attempt to maintain a seal, then cont suction at .  Placed gauze underneath the track pad to reduce pressure, then applied ace wrap. WOC team will plan to change dressing again on Wed if patient is still in the hospital at that time. Cammie Mcgee MSN, RN, CWOCN, [place], CNS [phone]

## 2020-12-06 NOTE — Progress Notes (Addendum)
   Subjective: 2 Days Post-Op Procedure(s) (LRB): AMPUTATION RIGHT SECOND TOE, I&D FOOT (Right) Patient reports pain as mild and moderate.    Objective: Vital signs in last 24 hours: Temp:  [98.4 F (36.9 C)-99.3 F (37.4 C)] 98.4 F (36.9 C) (06/20 0517) Pulse Rate:  [85-92] 87 (06/20 0517) Resp:  [17-18] 18 (06/20 0517) BP: (133-163)/(80-86) 134/83 (06/20 0517) SpO2:  [95 %-98 %] 95 % (06/20 0517)  Intake/Output from previous day: 06/19 0701 - 06/20 0700 In: 3193.4 [P.O.:1200; I.V.:883.3; IV Piggyback:1110] Out: 3750 [Urine:3750] Intake/Output this shift: No intake/output data recorded.  Recent Labs    12/04/20 0801 12/05/20 0355 12/06/20 0737  HGB 10.1* 9.4* 10.4*   Recent Labs    12/05/20 0355 12/06/20 0737  WBC 11.1* 11.2*  RBC 3.80* 4.24  HCT 29.0* 32.0*  PLT 298 315   Recent Labs    12/04/20 0801 12/05/20 0355  NA 136 137  K 4.3 3.9  CL 105 104  CO2 25 27  BUN 10 7  CREATININE 0.59 0.51  GLUCOSE 271* 141*  CALCIUM 8.3* 8.4*   No results for input(s): LABPT, INR in the last 72 hours.  VAC good seal.  No results found.  Assessment/Plan: 2 Days Post-Op Procedure(s) (LRB): AMPUTATION RIGHT SECOND TOE, I&D FOOT (Right) Plan:   waiting on cultures and VAC change. Maybe home Wed. After VAC change. Needs HHRN arranged for VAC changes at home. ABX per culture results once they are available.   Eldred Manges 12/06/2020, 7:54 AM

## 2020-12-06 NOTE — Progress Notes (Signed)
OT Cancellation Note  Patient Details Name: Valerie Le MRN: [id number] DOB: 02/21/1971   Cancelled Treatment:    Reason Eval/Treat Not Completed: Other (comment). Patient without post op shoe. Will f/u as able.  Kelli Churn 12/06/2020, 4:23 PM

## 2020-12-06 NOTE — Progress Notes (Signed)
PROGRESS NOTE    Valerie Le  JXB:[id number] DOB: April 09, 1971 DOA: 12/02/2020 PCP: [name], [name], PA   Brief Narrative: 50 year old with past medical history significant for insulin-dependent diabetes on insulin pump, run out of insulin in her pump, unable to get it until July 1, history of hypertension, morbid obesity who presents [facility] complaining of worsening wound of her right foot.  [facility], her wound is started in December, has a small ulceration on the top of her foot.  Worsening swelling of her foot and ankle today of admission.  Her blood sugars are recommended to be in the 500 range.  Evaluation in the ED: Patient was noted to be tachycardic heart rate 116 tachypneic respiration rate 27.  Sodium 130, potassium 3.5 CO2 28, glucose 509.  Lactic acid 1.1, white blood cell 15.  X-ray of the right foot showed no fracture or dislocation,  swelling with soft tissue gas consistent with cellulitis.   Assessment & Plan:   Principal Problem:   Diabetic foot infection (HCC) Active Problems:   Essential hypertension   Insulin-treated type 2 diabetes mellitus (HCC)   Asthma   ADHD (attention deficit hyperactivity disorder)   Bipolar affective disorder, currently depressed, moderate (HCC)   Morbid obesity due to excess calories (HCC)   Migraine   Tobacco abuse   Hyperlipidemia associated with type 2 diabetes mellitus (HCC)   1-Hyperosmolar, hyperglycemic state -Patient presented with hyperglycemia blood sugar 500, anion gap normal, CO2 28. -He was treated with insulin drip and IV fluids. -She was transition to Lantus and a sliding scale insulin. -Plan to increase lantus to 30 units. Increase meals coverage. Marland Kitchen.   2-Diabetic foot infection Osteomyelitis of the proximal phalanx of the right second toe.  -Continue with IV vancomycin and cefepime. -X-Ray Consistent with cellulitis. -MRI right foot; Acute osteomyelitis of the  proximal phalanx of the right second toe. Bone marrow edema within the middle phalanx of the second toe may represent reactive osteitis versus early acute osteomyelitis. Fluid and air containing collection predominantly located within the dorsal soft tissues of the forefoot extending to the base of the second toe measuring up to 7.7 x 1.7 x 2.9 cm compatible with abscess. Fluid collection extends into the first intermetatarsal space. Small first MTP joint effusion which may be reactive or represent septic arthritis. Diffuse intramuscular edema suggesting myositis. Plan for OR today, toe amputation and drainage of abscess.  Dr Ophelia CharterYates consulted.  Underwent debridement of dorsal left forefoot abscess and second toe amputation. With wound VAC.  Per Dr. Ophelia CharterYates  plan to change wound VAC today. She will need to be discharge with Wound Vac.  Culture : gram-positive cocci. Awaiting cultures results.   3-Hypertension: Continue with Norvasc and  lisinopril.   4-Hyperlipidemia: Continue with lipitor.   5-Morbid obesity: Needs life style modification.  Anemia; iron deficiency. Plan to start oral iron.    Tobacco abuse: counseled.   History of migraine: On Sumatriptan.      Estimated body mass index is 30.4 kg/m as calculated from the following:   Height as of this encounter: 5\' 11"  (1.803 m).   Weight as of this encounter: 98.9 kg.   DVT prophylaxis: Lovenox Code Status: Full code Family Communication: care discussed with patient.  Disposition Plan:  Status is: Inpatient  Remains inpatient appropriate because:Hemodynamically unstable  Dispo: The patient is from: Home              Anticipated d/c  is to: Home              Patient currently is not medically stable to d/c.   Difficult to place patient No        Consultants:  Dr Ophelia Charter  Procedures:  None  Antimicrobials:    Subjective: Pain controlled. Getting wound vac change/  She is planing on resuming insulin pump at  discharge.   Objective: Vitals:   12/05/20 0557 12/05/20 1303 12/05/20 2054 12/06/20 0517  BP: 116/68 133/80 (!) 163/86 134/83  Pulse: 83 85 92 87  Resp: 16 18 17 18   Temp: 98.2 F (36.8 C) 98.8 F (37.1 C) 99.3 F (37.4 C) 98.4 F (36.9 C)  TempSrc: Oral Oral Oral Oral  SpO2: 97% 98% 96% 95%  Weight:      Height:        Intake/Output Summary (Last 24 hours) at 12/06/2020 0910 Last data filed at 12/06/2020 0600 Gross per 24 hour  Intake 3073.36 ml  Output 2850 ml  Net 223.36 ml    Filed Weights   12/02/20 1708 12/02/20 2254  Weight: 100.2 kg 98.9 kg    Examination:  General exam: NAD Respiratory system: CTA Cardiovascular system: S 1, S 2  RRR Gastrointestinal system: BS present, soft,  nt Central nervous system: Alert Extremities: Symmetric 5 x 5 power. Right foot with open wound, less redness foot.    Data Reviewed: I have personally reviewed following labs and imaging studies  CBC: Recent Labs  Lab 12/02/20 1906 12/02/20 2347 12/03/20 0031 12/04/20 0801 12/05/20 0355 12/06/20 0737  WBC 15.0* 13.5* 13.7* 10.3 11.1* 11.2*  NEUTROABS 11.6*  --   --   --   --   --   HGB 11.3* 9.9* 9.5* 10.1* 9.4* 10.4*  HCT 34.6* 30.7* 29.1* 31.6* 29.0* 32.0*  MCV 74.1* 75.6* 76.0* 76.1* 76.3* 75.5*  PLT 249 235 246 274 298 315    Basic Metabolic Panel: Recent Labs  Lab 12/03/20 1120 12/03/20 1502 12/04/20 0801 12/05/20 0355 12/06/20 0737  NA 138 137 136 137 135  K 4.0 4.2 4.3 3.9 4.3  CL 103 103 105 104 99  CO2 27 24 25 27 30   GLUCOSE 121* 166* 271* 141* 298*  BUN 11 10 10 7 7   CREATININE 0.56 0.80 0.59 0.51 0.61  CALCIUM 8.4* 8.3* 8.3* 8.4* 8.7*    GFR: Estimated Creatinine Clearance: 108.9 mL/min (by C-G formula based on SCr of 0.61 mg/dL). Liver Function Tests: Recent Labs  Lab 12/02/20 1906 12/03/20 0031  AST 10* 9*  ALT 13 12  ALKPHOS 116 99  BILITOT 0.5 0.3  PROT 7.8 6.8  ALBUMIN 2.9* 2.6*    No results for input(s): LIPASE, AMYLASE in  the last 168 hours. No results for input(s): AMMONIA in the last 168 hours. Coagulation Profile: No results for input(s): INR, PROTIME in the last 168 hours. Cardiac Enzymes: No results for input(s): CKTOTAL, CKMB, CKMBINDEX, TROPONINI in the last 168 hours. BNP (last 3 results) No results for input(s): PROBNP in the last 8760 hours. HbA1C: No results for input(s): HGBA1C in the last 72 hours.  CBG: Recent Labs  Lab 12/05/20 0738 12/05/20 1126 12/05/20 1617 12/05/20 2057 12/06/20 0739  GLUCAP 168* 210* 245* 247* 270*    Lipid Profile: No results for input(s): CHOL, HDL, LDLCALC, TRIG, CHOLHDL, LDLDIRECT in the last 72 hours. Thyroid Function Tests: No results for input(s): TSH, T4TOTAL, FREET4, T3FREE, THYROIDAB in the last 72 hours. Anemia Panel: Recent Labs  12/04/20 0333  VITAMINB12 335  FOLATE 9.3  FERRITIN 196  TIBC 209*  IRON 15*  RETICCTPCT 0.7    Sepsis Labs: Recent Labs  Lab 12/02/20 1906 12/02/20 2041  LATICACIDVEN 1.1 1.1     Recent Results (from the past 240 hour(s))  Resp Panel by RT-PCR (Flu A&B, Covid) Nasopharyngeal Swab     Status: None   Collection Time: 12/02/20  7:06 PM   Specimen: Nasopharyngeal Swab; Nasopharyngeal(NP) swabs in vial transport medium  Result Value Ref Range Status   SARS Coronavirus 2 by RT PCR NEGATIVE NEGATIVE Final    Comment: (NOTE) SARS-CoV-2 target nucleic acids are NOT DETECTED.  The SARS-CoV-2 RNA is generally detectable in upper respiratory specimens during the acute phase of infection. The lowest concentration of SARS-CoV-2 viral copies this assay can detect is 138 copies/mL. A negative result does not preclude SARS-Cov-2 infection and should not be used as the sole basis for treatment or other patient management decisions. A negative result may occur with  improper specimen collection/handling, submission of specimen other than nasopharyngeal swab, presence of viral mutation(s) within the areas targeted  by this assay, and inadequate number of viral copies(<138 copies/mL). A negative result must be combined with clinical observations, patient history, and epidemiological information. The expected result is Negative.  Fact Sheet for Patients:  BloggerCourse.com  Fact Sheet for Healthcare Providers:  SeriousBroker.it  This test is no t yet approved or cleared by the [place] FDA and  has been authorized for detection and/or diagnosis of SARS-CoV-2 by FDA under an Emergency Use Authorization (EUA). This EUA will remain  in effect (meaning this test can be used) for the duration of the COVID-19 declaration under Section 564(b)(1) of the Act, 21 U.S.C.section 360bbb-3(b)(1), unless the authorization is terminated  or revoked sooner.       Influenza A by PCR NEGATIVE NEGATIVE Final   Influenza B by PCR NEGATIVE NEGATIVE Final    Comment: (NOTE) The Xpert Xpress SARS-CoV-2/FLU/RSV plus assay is intended as an aid in the diagnosis of influenza from Nasopharyngeal swab specimens and should not be used as a sole basis for treatment. Nasal washings and aspirates are unacceptable for Xpert Xpress SARS-CoV-2/FLU/RSV testing.  Fact Sheet for Patients: BloggerCourse.com  Fact Sheet for Healthcare Providers: SeriousBroker.it  This test is not yet approved or cleared by the [place] FDA and has been authorized for detection and/or diagnosis of SARS-CoV-2 by FDA under an Emergency Use Authorization (EUA). This EUA will remain in effect (meaning this test can be used) for the duration of the COVID-19 declaration under Section 564(b)(1) of the Act, 21 U.S.C. section 360bbb-3(b)(1), unless the authorization is terminated or revoked.  Performed at [facility], [address] [place], [place] [address]   MRSA PCR Screening     Status: None   Collection Time: 12/02/20 10:49  PM  Result Value Ref Range Status   MRSA by PCR NEGATIVE NEGATIVE Final    Comment:        The GeneXpert MRSA Assay (FDA approved for NASAL specimens only), is one component of a comprehensive MRSA colonization surveillance program. It is not intended to diagnose MRSA infection nor to guide or monitor treatment for MRSA infections. Performed at [facility], [address] [place], [place] [address]   Aerobic/Anaerobic Culture w Gram Stain (surgical/deep wound)     Status: None (Preliminary result)   Collection Time: 12/04/20  3:09 PM   Specimen: Wound  Result Value  Ref Range Status   Specimen Description   Final    WOUND Performed at [facility], [address], [place], [place] [address]    Special Requests   Final    NONE Performed at [facility], [address], [place], [place] [address]    Gram Stain   Final    ABUNDANT WBC PRESENT,BOTH PMN AND MONONUCLEAR RARE GRAM POSITIVE COCCI Performed at [facility], [address] [place], [place] [address]    Culture PENDING  Incomplete   Report Status PENDING  Incomplete          Radiology Studies: No results found.      Scheduled Meds:  amLODipine  10 mg Oral Daily   aspirin EC  81 mg Oral Daily   atorvastatin  80 mg Oral QHS   Chlorhexidine Gluconate Cloth  6 each Topical Daily   cholecalciferol  1,000 Units Oral Daily   enoxaparin (LOVENOX) injection  40 mg Subcutaneous Q24H   insulin aspart  0-15 Units Subcutaneous TID WC   insulin aspart  0-5 Units Subcutaneous QHS   insulin aspart  6 Units Subcutaneous TID WC   [START ON 12/07/2020] insulin glargine  30 Units Subcutaneous Daily   insulin glargine  5 Units Subcutaneous Once   lisinopril  40 mg Oral Daily   polyethylene glycol  17 g Oral Daily   senna-docusate  1 tablet Oral BID   Continuous Infusions:  sodium chloride 100 mL/hr at 12/04/20 0939   sodium chloride Stopped (12/03/20  1016)   ceFEPime (MAXIPIME) IV 2 g (12/06/20 0902)   vancomycin 750 mg (12/06/20 0429)     LOS: 4 days    Time spent: 35 minutes.     Alba Cory, MD [facility]   If 7PM-7AM, please contact night-coverage [email or web address]  12/06/2020, 9:10 AM

## 2020-12-06 NOTE — Progress Notes (Signed)
Orthopedic Tech Progress Note Patient Details:  Valerie Le [birth year] [id number]  Ortho Devices Type of Ortho Device: Postop shoe/boot Ortho Device/Splint Location: right Ortho Device/Splint Interventions: Application   Post Interventions Patient Tolerated: Well Instructions Provided: Care of device  Saul Fordyce 12/06/2020, 3:13 PM

## 2020-12-06 NOTE — Plan of Care (Signed)
  Problem: Activity: Goal: Risk for activity intolerance will decrease Outcome: Progressing   Problem: Nutrition: Goal: Adequate nutrition will be maintained Outcome: Progressing   Problem: Pain Managment: Goal: General experience of comfort will improve Outcome: Progressing   Problem: Safety: Goal: Ability to remain free from injury will improve Outcome: Progressing   

## 2020-12-06 NOTE — Evaluation (Signed)
Physical Therapy Evaluation Patient Details Name: Valerie Le MRN: [id number] DOB: March 05, 1971 Today's Date: 12/06/2020   History of Present Illness  50 yo female admitted with diabetic foot infection/osteomyelitis. S/P I&D forefoot absceess, 2nd toe amputation 6/18. Hx of DM, obesity  Clinical Impression  On eval, pt was Supv-Mod Ind with mobility. She walked ~75 feet with a RW. Minimal pain with ambulation. Encouraged pt to clarify WB status with surgeon on tomorrow in preparation for d/c in a few days. Do not anticipate any f/u PT needs after discharge. Pt did state that she has a flight of stairs to climb to get into apt.     Follow Up Recommendations No PT follow up    Equipment Recommendations   (TBD-continuing to assess)    Recommendations for Other Services       Precautions / Restrictions Restrictions Weight Bearing Restrictions: No Other Position/Activity Restrictions: post op shoe      Mobility  Bed Mobility Overal bed mobility: Modified Independent                  Transfers Overall transfer level: Modified independent                  Ambulation/Gait Ambulation/Gait assistance: Supervision Gait Distance (Feet): 75 Feet Assistive device: Rolling walker (2 wheeled)       General Gait Details: used RW and cued pt to keep weight thru heel to limit WBing on forefoot.  Stairs            Wheelchair Mobility    Modified Rankin (Stroke Patients Only)       Balance Overall balance assessment: Mild deficits observed, not formally tested                                           Pertinent Vitals/Pain Pain Assessment: Faces Faces Pain Scale: Hurts a little bit Pain Location: R foot Pain Descriptors / Indicators: Discomfort;Sore Pain Intervention(s): Monitored during session (encouraged pt to elevate)    Home Living Family/patient expects to be discharged to:: Private residence Living Arrangements:  Spouse/significant other Available Help at Discharge: Family Type of Home: Apartment Home Access: Stairs to enter Entrance Stairs-Rails: Right Entrance Stairs-Number of Steps: 1 flight Home Layout: One level Home Equipment: None      Prior Function Level of Independence: Independent               Hand Dominance        Extremity/Trunk Assessment   Upper Extremity Assessment Upper Extremity Assessment: Overall WFL for tasks assessed    Lower Extremity Assessment Lower Extremity Assessment: RLE deficits/detail RLE Deficits / Details: dressing on R foot and ankle    Cervical / Trunk Assessment Cervical / Trunk Assessment: Normal  Communication   Communication: No difficulties  Cognition Arousal/Alertness: Awake/alert Behavior During Therapy: WFL for tasks assessed/performed Overall Cognitive Status: Within Functional Limits for tasks assessed                                        General Comments      Exercises     Assessment/Plan    PT Assessment Patient needs continued PT services  PT Problem List Decreased strength;Decreased mobility;Decreased activity tolerance;Decreased balance;Decreased knowledge of use of DME;Pain;Decreased range of motion  PT Treatment Interventions DME instruction;Gait training;Therapeutic exercise;Balance training;Therapeutic activities;Functional mobility training;Patient/family education    PT Goals (Current goals can be found in the Care Plan section)  Acute Rehab PT Goals Patient Stated Goal: home soon PT Goal Formulation: With patient Time For Goal Achievement: 12/20/20 Potential to Achieve Goals: Good    Frequency Min 3X/week   Barriers to discharge        Co-evaluation               AM-PAC PT "6 Clicks" Mobility  Outcome Measure Help needed turning from your back to your side while in a flat bed without using bedrails?: None Help needed moving from lying on your back to sitting on  the side of a flat bed without using bedrails?: None Help needed moving to and from a bed to a chair (including a wheelchair)?: None Help needed standing up from a chair using your arms (e.g., wheelchair or bedside chair)?: None Help needed to walk in hospital room?: A Little Help needed climbing 3-5 steps with a railing? : A Little 6 Click Score: 22    End of Session   Activity Tolerance: Patient tolerated treatment well Patient left: in bed;with call bell/phone within reach (with chaplain visiting)   PT Visit Diagnosis: Pain;Difficulty in walking, not elsewhere classified (R26.2) Pain - Right/Left: Right Pain - part of body: Ankle and joints of foot    Time: 1539-1550 PT Time Calculation (min) (ACUTE ONLY): 11 min   Charges:   PT Evaluation $PT Eval Low Complexity: 1 Low            Faye Ramsay, PT Acute Rehabilitation  Office: [phone] Pager: [phone]

## 2020-12-06 NOTE — TOC Progression Note (Signed)
Transition of Care (TOC) - Progression Note    Patient Details  Name: Valerie Le MRN: [id number] Date of Birth: 11/05/1970  Transition of Care (TOC) CM/SW Contact  HOYLE, LUCY, LCSW Phone Number: 12/06/2020, 3:20 PM  Clinical Narrative:    Met with pt to review dc planning needs.  Pt aware plan is for dc home with wound VAC and HHRN management.  She reports that she is currently uninsured, however, will become active with [employer] on 12/17/20.  Have reached out to Tracy Barrow, rep for [employer] (wound VAC) and we have begun referral process.  Pt will need to apply for charity coverage and, hopefully, can begin billing [employer] on 7/1.  Working on securing HHRN coverage.   Expected Discharge Plan: Home w Home Health Services Barriers to Discharge: Continued Medical Work up  Expected Discharge Plan and Services Expected Discharge Plan: Home w Home Health Services   Discharge Planning Services: CM Consult   Living arrangements for the past 2 months: Apartment                                       Social Determinants of Health (SDOH) Interventions    Readmission Risk Interventions Readmission Risk Prevention Plan 12/06/2020  Post Dischage Appt Complete  Medication Screening Complete  Transportation Screening Complete    

## 2020-12-06 NOTE — Progress Notes (Signed)
PT Cancellation Note  Patient Details Name: Valerie Le MRN: [id number] DOB: 1971-06-05   Cancelled Treatment:    Reason Eval/Treat Not Completed: Medical issues which prohibited therapy. Order received. Chart reviewed. Per surgeon progress note, pt is to wear a post op shoe. No shoe in room at this time. Also did not see an order. Asked RN to get order and to have ortho tech bring shoe to room. Will check back and perform PT eval once shoe is in room.      Faye Ramsay, PT Acute Rehabilitation  Office: [phone] Pager: [phone]

## 2020-12-07 LAB — GLUCOSE, CAPILLARY
Glucose-Capillary: 236 mg/dL — ABNORMAL HIGH (ref 70–99)
Glucose-Capillary: 245 mg/dL — ABNORMAL HIGH (ref 70–99)
Glucose-Capillary: 315 mg/dL — ABNORMAL HIGH (ref 70–99)
Glucose-Capillary: 208 mg/dL — ABNORMAL HIGH (ref 70–99)

## 2020-12-07 MED ORDER — BISACODYL 10 MG RE SUPP: 10.0000 mg | Freq: Every day | RECTAL | Status: DC | PRN

## 2020-12-07 MED ORDER — POLYETHYLENE GLYCOL 3350 17 G PO PACK
17.0000 g | PACK | Freq: Two times a day (BID) | ORAL | Status: DC
  Administered 2020-12-07 – 2020-12-08 (×3): 17 g via ORAL
  Filled 2020-12-07 (×5): qty 1

## 2020-12-07 MED ORDER — BISACODYL 5 MG PO TBEC
10.0000 mg | DELAYED_RELEASE_TABLET | Freq: Once | ORAL | Status: AC
  Administered 2020-12-07: 10 mg via ORAL
  Filled 2020-12-07: qty 2

## 2020-12-07 MED ORDER — INSULIN GLARGINE 100 UNIT/ML ~~LOC~~ SOLN
35.0000 [IU] | Freq: Every day | SUBCUTANEOUS | Status: DC
  Administered 2020-12-07 – 2020-12-08 (×2): 35 [IU] via SUBCUTANEOUS
  Filled 2020-12-07 (×2): qty 0.35

## 2020-12-07 MED ORDER — VANCOMYCIN HCL 1000 MG/200ML IV SOLN
1000.0000 mg | Freq: Two times a day (BID) | INTRAVENOUS | Status: DC
  Administered 2020-12-07 – 2020-12-09 (×4): 1000 mg via INTRAVENOUS
  Filled 2020-12-07 (×5): qty 200

## 2020-12-07 NOTE — Progress Notes (Signed)
  Tu has attempted to page the diabetic coordinator twice today. She reached out to me this afternoon secondary to being unable to reach the diabetic coordinator.   The doctor requested the DC come back and speak with the patient again today about an insulin pump.   We have been paging [phone].  If there is an updated number, please let us know.    Thank you!

## 2020-12-07 NOTE — Progress Notes (Signed)
Subjective: Doing well. Pain controlled.  Wants to go home.    Objective: Vital signs in last 24 hours: Temp:  [98.1 F (36.7 C)-98.7 F (37.1 C)] 98.1 F (36.7 C) (06/21 0606) Pulse Rate:  [82-94] 82 (06/21 0606) Resp:  [18-20] 20 (06/21 0606) BP: (134-151)/(69-87) 134/75 (06/21 0606) SpO2:  [95 %-98 %] 95 % (06/21 0606)  Intake/Output from previous day: 06/20 0701 - 06/21 0700 In: 2296.9 [P.O.:1200; I.V.:446.9; IV Piggyback:650] Out: 2050 [Urine:2050] Intake/Output this shift: No intake/output data recorded.  Recent Labs    12/05/20 0355 12/06/20 0737  HGB 9.4* 10.4*   Recent Labs    12/05/20 0355 12/06/20 0737  WBC 11.1* 11.2*  RBC 3.80* 4.24  HCT 29.0* 32.0*  PLT 298 315   Recent Labs    12/05/20 0355 12/06/20 0737  NA 137 135  K 3.9 4.3  CL 104 99  CO2 27 30  BUN 7 7  CREATININE 0.51 0.61  GLUCOSE 141* 298*  CALCIUM 8.4* 8.7*   No results for input(s): LABPT, INR in the last 72 hours.  Exam: Very pleasant female, alert and oriented. NAD.  Dressing C/D/I.  Wound vac intact. Calf nontender.     Assessment/Plan: -Continue present care. -Vac change tomorrow.  Dr Ophelia Charter will decide if patient ok to d/c home after.     Zonia Kief 12/07/2020, 9:54 AM

## 2020-12-07 NOTE — Progress Notes (Signed)
Orthopedic Tech Progress Note Patient Details:  Valerie Le 1971-01-27 [id number]  Ortho Devices Type of Ortho Device: Crutches Ortho Device/Splint Location: right Ortho Device/Splint Interventions: Adjustment   Post Interventions Patient Tolerated: Well Instructions Provided: Care of device  Saul Fordyce 12/07/2020, 10:52 AM

## 2020-12-07 NOTE — Progress Notes (Signed)
Paged diabetic coordinator to see pt.

## 2020-12-07 NOTE — Plan of Care (Signed)
  Problem: Activity: Goal: Risk for activity intolerance will decrease Outcome: Progressing   Problem: Pain Managment: Goal: General experience of comfort will improve Outcome: Progressing   Problem: Safety: Goal: Ability to remain free from injury will improve Outcome: Progressing   

## 2020-12-07 NOTE — Progress Notes (Signed)
Paged diabetic coordinator again at [phone]) to see pt.

## 2020-12-07 NOTE — Evaluation (Signed)
Occupational Therapy Evaluation Patient Details Name: Valerie Le MRN: [id number] DOB: 05-09-1971 Today's Date: 12/07/2020    History of Present Illness 50 yo female admitted with diabetic foot infection/osteomyelitis. S/P I&D forefoot absceess, 2nd toe amputation 6/18. Hx of DM, obesity   Clinical Impression   Patient demonstrates ability to perform ADLs and mobility at modified independence. Discussed patient's home setup and therapist recommended use of shower chair for safety (if allowed to shower). Patient has assistance of family if needed. No further OT needs.    Follow Up Recommendations  No OT follow up    Equipment Recommendations  Tub/shower seat    Recommendations for Other Services       Precautions / Restrictions Precautions Precautions: None Restrictions Weight Bearing Restrictions: No Other Position/Activity Restrictions: post op shoe      Mobility Bed Mobility Overal bed mobility: Modified Independent                  Transfers Overall transfer level: Modified independent                    Balance Overall balance assessment: Mild deficits observed, not formally tested                                         ADL either performed or assessed with clinical judgement   ADL Overall ADL's : Modified independent                                             Vision Patient Visual Report: No change from baseline       Perception     Praxis      Pertinent Vitals/Pain Pain Assessment: No/denies pain     Hand Dominance     Extremity/Trunk Assessment Upper Extremity Assessment Upper Extremity Assessment: Overall WFL for tasks assessed           Communication Communication Communication: No difficulties   Cognition Arousal/Alertness: Awake/alert Behavior During Therapy: WFL for tasks assessed/performed Overall Cognitive Status: Within Functional Limits for tasks assessed                                      General Comments       Exercises     Shoulder Instructions      Home Living Family/patient expects to be discharged to:: Private residence Living Arrangements: Spouse/significant other Available Help at Discharge: Family Type of Home: Apartment Home Access: Stairs to enter Secretary/administrator of Steps: 1 flight Entrance Stairs-Rails: Right Home Layout: One level               Home Equipment: None   Additional Comments: will have assistance of teenage children if needed      Prior Functioning/Environment Level of Independence: Independent                 OT Problem List:        OT Treatment/Interventions:      OT Goals(Current goals can be found in the care plan section) Acute Rehab OT Goals OT Goal Formulation: All assessment and education complete, DC therapy  OT Frequency:     Barriers to D/C:  Co-evaluation              AM-PAC OT "6 Clicks" Daily Activity     Outcome Measure Help from another person eating meals?: None Help from another person taking care of personal grooming?: None Help from another person toileting, which includes using toliet, bedpan, or urinal?: None Help from another person bathing (including washing, rinsing, drying)?: None Help from another person to put on and taking off regular upper body clothing?: None Help from another person to put on and taking off regular lower body clothing?: None 6 Click Score: 24   End of Session Nurse Communication: Mobility status  Activity Tolerance: Patient tolerated treatment well Patient left: with call bell/phone within reach;in chair  OT Visit Diagnosis: Other abnormalities of gait and mobility (R26.89);Pain                Time: 0811-0824 OT Time Calculation (min): 13 min Charges:  OT General Charges $OT Visit: 1 Visit OT Evaluation $OT Eval Low Complexity: 1 Low  Geovannie Vilar, OTR/L Acute Care Rehab Services  Office  [phone] Pager: [phone]   Kelli Churn 12/07/2020, 11:26 AM

## 2020-12-07 NOTE — Progress Notes (Signed)
Pharmacy Antibiotic Note  Valerie Le is a 50 y.o. female admitted on 12/02/2020 with wound on the top of the right foot. PMH significant for DM. Pharmacy has been consulted to dose vancomycin and cefepime.  Pt underwent I&D, amputation of right second toe on 6/18.   Today, 12/07/20 WBC remains slightly elevated SCr 0.61 - stable, WNL. CrCl > 60 mL/min Afebrile  Today is day #6 of IV broad spectrum antibiotics. No definitive growth in cultures. Wound culture obtained after antibiotic course initiated.  Plan: Continue cefepime 2 g IV q8h Reduce vancomycin to 1000 mg IV q12h for goal AUC 400-550. Follow renal function  Anticipate transition to PO antibiotics soon. Renal function stable, will not check levels  Height: 5\' 11"  (180.3 cm) Weight: 98.9 kg (218 lb) IBW/kg (Calculated) : 70.8  Temp (24hrs), Avg:98.4 F (36.9 C), Min:98.1 F (36.7 C), Max:98.7 F (37.1 C)  Recent Labs  Lab 12/02/20 1906 12/02/20 2041 12/02/20 2347 12/03/20 0031 12/03/20 0729 12/03/20 1120 12/03/20 1502 12/04/20 0801 12/05/20 0355 12/06/20 0737  WBC 15.0*  --  13.5* 13.7*  --   --   --  10.3 11.1* 11.2*  CREATININE 0.75  --  0.71 0.74   < > 0.56 0.80 0.59 0.51 0.61  LATICACIDVEN 1.1 1.1  --   --   --   --   --   --   --   --    < > = values in this interval not displayed.     Estimated Creatinine Clearance: 108.9 mL/min (by C-G formula based on SCr of 0.61 mg/dL).    Allergies  Allergen Reactions   Coconut Oil Anaphylaxis   Pineapple Anaphylaxis   Tomato Anaphylaxis   Latex Hives   Metformin And Related Other (See Comments)    Constipation/cramps    Antimicrobials this admission:  vancomycin 6/16 >>  cefepime 6/16 >>   Dose adjustments this admission:  6/21: Vancomycin 750 q8h >> 1000 mg IV q12h  Microbiology results:  6/16 BCx:  6/18 Wound culture: rare GPC, no growth  7/18, PharmD 12/07/20 12:11 PM

## 2020-12-07 NOTE — Progress Notes (Signed)
Physical Therapy Treatment Patient Details Name: Valerie Le MRN: [id number] DOB: 12/31/1970 Today's Date: 12/07/2020    History of Present Illness 50 yo female admitted with diabetic foot infection/osteomyelitis. S/P I&D forefoot absceess, 2nd toe amputation 6/18. Hx of DM, obesity    PT Comments    General Comments: AxO x 3 very motivated and currently on her commputer working.   Assisted with amb with a walker and one crutch which pt can progress to at a later date.  Pt aware to WB thru heel and always wear post op shoe.  Also instructed on importance of foot elevation while at rest.  Also practiced stairs using one crutch and one rail.   Pt plans to return home with a VAC  Follow Up Recommendations  No PT follow up     Equipment Recommendations  Rolling walker with 5" wheels    Recommendations for Other Services       Precautions / Restrictions Precautions Precautions: None Restrictions Weight Bearing Restrictions: No Other Position/Activity Restrictions: post op shoe    Mobility  Bed Mobility Overal bed mobility: Modified Independent             General bed mobility comments: Pt OOB in recliner    Transfers Overall transfer level: Needs assistance Equipment used: Rolling walker (2 wheeled) Transfers: Sit to/from Stand Sit to Stand: Supervision         General transfer comment: good safety cognition  Ambulation/Gait Ambulation/Gait assistance: Supervision Gait Distance (Feet): 85 Feet Assistive device: Rolling walker (2 wheeled) Gait Pattern/deviations: Step-to pattern;Decreased stance time - right Gait velocity: decreased   General Gait Details: used RW and cued pt to keep weight thru heel to limit WBing on forefoot.  Also instructed on how to amb with one crutch which pt can progress to at a later time.   Stairs             Wheelchair Mobility    Modified Rankin (Stroke Patients Only)       Balance Overall balance  assessment: Mild deficits observed, not formally tested                                          Cognition Arousal/Alertness: Awake/alert Behavior During Therapy: WFL for tasks assessed/performed Overall Cognitive Status: Within Functional Limits for tasks assessed                                 General Comments: AxO x 3 very motivated and currently on her commputer working      Exercises      General Comments        Pertinent Vitals/Pain Pain Assessment: No/denies pain    Home Living Family/patient expects to be discharged to:: Private residence Living Arrangements: Spouse/significant other Available Help at Discharge: Family Type of Home: Apartment Home Access: Stairs to enter Entrance Stairs-Rails: Right Home Layout: One level Home Equipment: None Additional Comments: will have assistance of teenage children if needed    Prior Function Level of Independence: Independent          PT Goals (current goals can now be found in the care plan section) Progress towards PT goals: Progressing toward goals    Frequency    Min 3X/week      PT Plan Current plan remains appropriate    Co-evaluation  AM-PAC PT "6 Clicks" Mobility   Outcome Measure  Help needed turning from your back to your side while in a flat bed without using bedrails?: None Help needed moving from lying on your back to sitting on the side of a flat bed without using bedrails?: None Help needed moving to and from a bed to a chair (including a wheelchair)?: A Little Help needed standing up from a chair using your arms (e.g., wheelchair or bedside chair)?: A Little Help needed to walk in hospital room?: A Little Help needed climbing 3-5 steps with a railing? : A Little 6 Click Score: 20    End of Session Equipment Utilized During Treatment: Gait belt Activity Tolerance: Patient tolerated treatment well Patient left: in chair;with call bell/phone  within reach Nurse Communication: Mobility status PT Visit Diagnosis: Pain;Difficulty in walking, not elsewhere classified (R26.2) Pain - Right/Left: Right Pain - part of body: Ankle and joints of foot     Time: 1010-1040 PT Time Calculation (min) (ACUTE ONLY): 30 min  Charges:  $Gait Training: 8-22 mins $Therapeutic Activity: 8-22 mins                     Felecia Shelling  PTA Acute  Rehabilitation Services Pager      [phone] Office      [phone]

## 2020-12-07 NOTE — Progress Notes (Signed)
PROGRESS NOTE    Valerie Le  LZJ:[id number] DOB: 1970-11-29 DOA: 12/02/2020 PCP: Maud Deed, PA   Brief Narrative: 50 year old with past medical history significant for insulin-dependent diabetes on insulin pump, run out of insulin in her pump supplies, unable to get it until July 1, history of hypertension, morbid obesity who presents to med [facility] complaining of worsening wound of her right foot.  Patient follows at [facility] wound center, her wound  started in December, has a small ulceration on the top of her foot.  Worsening swelling of her foot and ankle the day of admission.  Her blood sugars has been 500 range.  Evaluation in the ED: Patient was noted to be tachycardic heart rate 116 tachypneic respiration rate 27.  Sodium 130, potassium 3.5 CO2 28, glucose 509.  Lactic acid 1.1, white blood cell 15.  X-ray of the right foot showed no fracture or dislocation,  swelling with soft tissue gas consistent with cellulitis.  Patient admitted with Diabetic foot infection, dorsal forefoot abscess and Osteomyelitis of the proximal phalanx of the right second toe. She has been getting IV antibiotics. She underwent I and D of forefoot abscess and amputation of right second toe. She presented with Hyperglycemia, hyperosmolar state. She was on insulin Gtt and subsequently transition to Lantus.    Assessment & Plan:   Principal Problem:   Diabetic foot infection (HCC) Active Problems:   Essential hypertension   Insulin-treated type 2 diabetes mellitus (HCC)   Asthma   ADHD (attention deficit hyperactivity disorder)   Bipolar affective disorder, currently depressed, moderate (HCC)   Morbid obesity due to excess calories (HCC)   Migraine   Tobacco abuse   Hyperlipidemia associated with type 2 diabetes mellitus (HCC)   1-Hyperosmolar, hyperglycemic state -Patient presented with hyperglycemia blood sugar 500, anion gap normal, CO2 28. -She was treated with insulin drip  and IV fluids. -She was transition to Lantus and a sliding scale insulin. -Plan to increase lantus to 35 units. Increase meals coverage. . SSI.  -patient is planning to resume insulin pump at discharge. Diabetes Educator consulted.   2-Diabetic foot infection, dorsal forefoot abscess, Osteomyelitis of the proximal phalanx of the right second toe.  -Continue with IV vancomycin and cefepime. -X-Ray Consistent with cellulitis. -MRI right foot; Acute osteomyelitis of the proximal phalanx of the right second toe. Bone marrow edema within the middle phalanx of the second toe may represent reactive osteitis versus early acute osteomyelitis. Fluid and air containing collection predominantly located within the dorsal soft tissues of the forefoot extending to the base of the second toe measuring up to 7.7 x 1.7 x 2.9 cm compatible with abscess. Fluid collection extends into the first intermetatarsal space. Small first MTP joint effusion which may be reactive or represent septic arthritis. Diffuse intramuscular edema suggesting myositis. -Underwent debridement of dorsal left forefoot abscess and second toe amputation by Dr Ophelia Charter on 6/18. -Plan to continue with  wound VAC.  She will need wound vac at discharge/ CM working on [facility].  -Culture : gram-positive cocci. Awaiting cultures results to tailor antibiotics.    3-Hypertension: Continue with Norvasc and  lisinopril.   4-Hyperlipidemia: Continue with lipitor.   5-Morbid obesity: Needs life style modification.   Anemia; iron deficiency. Started oral iron.   Tobacco abuse: counseled.   History of migraine: On Sumatriptan.      Estimated body mass index is 30.4 kg/m as calculated from the following:   Height as of this encounter: 5\' 11"  (  1.803 m).   Weight as of this encounter: 98.9 kg.   DVT prophylaxis: Lovenox Code Status: Full code Family Communication: care discussed with patient.  Disposition Plan:  Status is: Inpatient  Remains  inpatient appropriate because:Hemodynamically unstable  Dispo: The patient is from: Home              Anticipated d/c is to: Home              Patient currently is not medically stable to d/c. In 24 hours if wound culture available.    Difficult to place patient No        Consultants:  Dr Ophelia CharterYates  Procedures:  None  Antimicrobials:    Subjective: She was sitting recliner. Pain is control, comes and go.  She will work on her diet and will follow up with her endocrinologist. She understand importance of good sugar control.    Objective: Vitals:   12/06/20 0517 12/06/20 1352 12/06/20 2145 12/07/20 0606  BP: 134/83 (!) 151/87 (!) 149/69 134/75  Pulse: 87 89 94 82  Resp: 18 18 18 20   Temp: 98.4 F (36.9 C) 98.3 F (36.8 C) 98.7 F (37.1 C) 98.1 F (36.7 C)  TempSrc: Oral Oral Oral Oral  SpO2: 95% 98% 96% 95%  Weight:      Height:        Intake/Output Summary (Last 24 hours) at 12/07/2020 0849 Last data filed at 12/07/2020 0606 Gross per 24 hour  Intake 2296.85 ml  Output 2050 ml  Net 246.85 ml    Filed Weights   12/02/20 1708 12/02/20 2254  Weight: 100.2 kg 98.9 kg    Examination:  General exam: NAD Respiratory system: CTA Cardiovascular system: S 1, S 2 RRR Gastrointestinal system: BS present, soft, nt Central nervous system: Alert Extremities: Symmetric power, wound vac in place.    Data Reviewed: I have personally reviewed following labs and imaging studies  CBC: Recent Labs  Lab 12/02/20 1906 12/02/20 2347 12/03/20 0031 12/04/20 0801 12/05/20 0355 12/06/20 0737  WBC 15.0* 13.5* 13.7* 10.3 11.1* 11.2*  NEUTROABS 11.6*  --   --   --   --   --   HGB 11.3* 9.9* 9.5* 10.1* 9.4* 10.4*  HCT 34.6* 30.7* 29.1* 31.6* 29.0* 32.0*  MCV 74.1* 75.6* 76.0* 76.1* 76.3* 75.5*  PLT 249 235 246 274 298 315    Basic Metabolic Panel: Recent Labs  Lab 12/03/20 1120 12/03/20 1502 12/04/20 0801 12/05/20 0355 12/06/20 0737  NA 138 137 136 137 135  K  4.0 4.2 4.3 3.9 4.3  CL 103 103 105 104 99  CO2 27 24 25 27 30   GLUCOSE 121* 166* 271* 141* 298*  BUN 11 10 10 7 7   CREATININE 0.56 0.80 0.59 0.51 0.61  CALCIUM 8.4* 8.3* 8.3* 8.4* 8.7*    GFR: Estimated Creatinine Clearance: 108.9 mL/min (by C-G formula based on SCr of 0.61 mg/dL). Liver Function Tests: Recent Labs  Lab 12/02/20 1906 12/03/20 0031  AST 10* 9*  ALT 13 12  ALKPHOS 116 99  BILITOT 0.5 0.3  PROT 7.8 6.8  ALBUMIN 2.9* 2.6*    No results for input(s): LIPASE, AMYLASE in the last 168 hours. No results for input(s): AMMONIA in the last 168 hours. Coagulation Profile: No results for input(s): INR, PROTIME in the last 168 hours. Cardiac Enzymes: No results for input(s): CKTOTAL, CKMB, CKMBINDEX, TROPONINI in the last 168 hours. BNP (last 3 results) No results for input(s): PROBNP in the last 8760  hours. HbA1C: No results for input(s): HGBA1C in the last 72 hours.  CBG: Recent Labs  Lab 12/06/20 0739 12/06/20 1138 12/06/20 1653 12/06/20 2150 12/07/20 0723  GLUCAP 270* 277* 199* 247* 236*    Lipid Profile: No results for input(s): CHOL, HDL, LDLCALC, TRIG, CHOLHDL, LDLDIRECT in the last 72 hours. Thyroid Function Tests: No results for input(s): TSH, T4TOTAL, FREET4, T3FREE, THYROIDAB in the last 72 hours. Anemia Panel: No results for input(s): VITAMINB12, FOLATE, FERRITIN, TIBC, IRON, RETICCTPCT in the last 72 hours.  Sepsis Labs: Recent Labs  Lab 12/02/20 1906 12/02/20 2041  LATICACIDVEN 1.1 1.1     Recent Results (from the past 240 hour(s))  Resp Panel by RT-PCR (Flu A&B, Covid) Nasopharyngeal Swab     Status: None   Collection Time: 12/02/20  7:06 PM   Specimen: Nasopharyngeal Swab; Nasopharyngeal(NP) swabs in vial transport medium  Result Value Ref Range Status   SARS Coronavirus 2 by RT PCR NEGATIVE NEGATIVE Final    Comment: (NOTE) SARS-CoV-2 target nucleic acids are NOT DETECTED.  The SARS-CoV-2 RNA is generally detectable in upper  respiratory specimens during the acute phase of infection. The lowest concentration of SARS-CoV-2 viral copies this assay can detect is 138 copies/mL. A negative result does not preclude SARS-Cov-2 infection and should not be used as the sole basis for treatment or other patient management decisions. A negative result may occur with  improper specimen collection/handling, submission of specimen other than nasopharyngeal swab, presence of viral mutation(s) within the areas targeted by this assay, and inadequate number of viral copies(<138 copies/mL). A negative result must be combined with clinical observations, patient history, and epidemiological information. The expected result is Negative.  Fact Sheet for Patients:  BloggerCourse.com  Fact Sheet for Healthcare Providers:  SeriousBroker.it  This test is no t yet approved or cleared by the [place] FDA and  has been authorized for detection and/or diagnosis of SARS-CoV-2 by FDA under an Emergency Use Authorization (EUA). This EUA will remain  in effect (meaning this test can be used) for the duration of the COVID-19 declaration under Section 564(b)(1) of the Act, 21 U.S.C.section 360bbb-3(b)(1), unless the authorization is terminated  or revoked sooner.       Influenza A by PCR NEGATIVE NEGATIVE Final   Influenza B by PCR NEGATIVE NEGATIVE Final    Comment: (NOTE) The Xpert Xpress SARS-CoV-2/FLU/RSV plus assay is intended as an aid in the diagnosis of influenza from Nasopharyngeal swab specimens and should not be used as a sole basis for treatment. Nasal washings and aspirates are unacceptable for Xpert Xpress SARS-CoV-2/FLU/RSV testing.  Fact Sheet for Patients: BloggerCourse.com  Fact Sheet for Healthcare Providers: SeriousBroker.it  This test is not yet approved or cleared by the [place] FDA and has been  authorized for detection and/or diagnosis of SARS-CoV-2 by FDA under an Emergency Use Authorization (EUA). This EUA will remain in effect (meaning this test can be used) for the duration of the COVID-19 declaration under Section 564(b)(1) of the Act, 21 U.S.C. section 360bbb-3(b)(1), unless the authorization is terminated or revoked.  Performed at [facility], [address] [place], [place] [address]   MRSA PCR Screening     Status: None   Collection Time: 12/02/20 10:49 PM  Result Value Ref Range Status   MRSA by PCR NEGATIVE NEGATIVE Final    Comment:        The GeneXpert MRSA Assay (FDA approved for NASAL specimens only), is one component of a  comprehensive MRSA colonization surveillance program. It is not intended to diagnose MRSA infection nor to guide or monitor treatment for MRSA infections. Performed at [facility], [address] [place], [place] [address]   Aerobic/Anaerobic Culture w Gram Stain (surgical/deep wound)     Status: None (Preliminary result)   Collection Time: 12/04/20  3:09 PM   Specimen: Wound  Result Value Ref Range Status   Specimen Description   Final    WOUND Performed at [facility], [address] [place], [place] [address]    Special Requests   Final    NONE Performed at [facility], [address] [place], [place] [address]    Gram Stain   Final    ABUNDANT WBC PRESENT,BOTH PMN AND MONONUCLEAR RARE GRAM POSITIVE COCCI    Culture   Final    NO GROWTH 2 DAYS Performed at [facility], [address] [place], [place] [address]    Report Status PENDING  Incomplete          Radiology Studies: No results found.      Scheduled Meds:  amLODipine  10 mg Oral Daily   aspirin EC  81 mg Oral Daily   atorvastatin  80 mg Oral QHS   bisacodyl  10 mg Oral Once   Chlorhexidine Gluconate Cloth  6 each Topical Daily   cholecalciferol  1,000 Units Oral  Daily   enoxaparin (LOVENOX) injection  40 mg Subcutaneous Q24H   ferrous sulfate  325 mg Oral Q breakfast   insulin aspart  0-15 Units Subcutaneous TID WC   insulin aspart  0-5 Units Subcutaneous QHS   insulin aspart  6 Units Subcutaneous TID WC   insulin glargine  35 Units Subcutaneous Daily   lisinopril  40 mg Oral Daily   polyethylene glycol  17 g Oral BID   senna-docusate  1 tablet Oral BID   Continuous Infusions:  sodium chloride 100 mL/hr at 12/06/20 2357   sodium chloride Stopped (12/03/20 1016)   ceFEPime (MAXIPIME) IV 2 g (12/07/20 0809)   vancomycin 750 mg (12/07/20 0417)     LOS: 5 days    Time spent: 35 minutes.     Alba Cory, MD [facility]   If 7PM-7AM, please contact night-coverage [email or web address]  12/07/2020, 8:49 AM

## 2020-12-08 LAB — GLUCOSE, CAPILLARY
Glucose-Capillary: 323 mg/dL — ABNORMAL HIGH (ref 70–99)
Glucose-Capillary: 288 mg/dL — ABNORMAL HIGH (ref 70–99)
Glucose-Capillary: 225 mg/dL — ABNORMAL HIGH (ref 70–99)
Glucose-Capillary: 126 mg/dL — ABNORMAL HIGH (ref 70–99)

## 2020-12-08 MED ORDER — INSULIN GLARGINE 100 UNIT/ML ~~LOC~~ SOLN
45.0000 [IU] | Freq: Every day | SUBCUTANEOUS | Status: DC
  Administered 2020-12-09: 45 [IU] via SUBCUTANEOUS
  Filled 2020-12-08: qty 0.45

## 2020-12-08 NOTE — TOC Progression Note (Signed)
Transition of Care [facility]) - Progression Note    Patient Details  Name: Valerie Le MRN: [id number] Date of Birth: [birth year]  Transition of Care [facility]) CM/SW Contact  Amada Jupiter, LCSW Phone Number: 12/08/2020, 2:36 PM  Clinical Narrative:    Wound VAC for home use ordered via [employer] and delivered to pt's room today.  Have been declined by ALL [facility] agencies covering this area to provide [facility] for wound VAC changes at home.  Pt and MD aware and agreeable with plan that pt will carry needed supplies to MD office to have changes done onsite there MWF.  Continue to follow and will need orders for a rolling walker and possible medication assistance.   Expected Discharge Plan: Home w Home Health Services Barriers to Discharge: Continued Medical Work up  Expected Discharge Plan and Services Expected Discharge Plan: Home w Home Health Services   Discharge Planning Services: CM Consult   Living arrangements for the past 2 months: Apartment                                       Social Determinants of Health (SDOH) Interventions    Readmission Risk Interventions Readmission Risk Prevention Plan 12/06/2020  Post Dischage Appt Complete  Medication Screening Complete  Transportation Screening Complete

## 2020-12-08 NOTE — Consult Note (Signed)
WOC Nurse wound follow up Wound type:Routine NPWT dressing change Measurement: per Monday, 5cm x 2cm x 1cm Wound bed:red, moist Drainage (amount, consistency, odor) brown, red with serum Periwound:edges moist and macerated. A 4cm x 0.3cm discolored area is noted on the dorsal foot. Dressing procedure/placement/frequency: Dressing removed and wound cleansed with NS and patted dry. Skin barrier ring used to protect periwound skin and enhance seal.  Two pieces of black foam used to obliterate dead space and this is secured with drape.  Dressing is attached to NPWT at and an immediate seal is achieved. Dressing is labeled and skin padded with an ABD pad so that tubing does not rest upon skin.  An ACE bandage with light compression is applied to secure.  Paitent is premedicated for anticipatory pain, but reports no discomfort.  She is pleased with appearance of wound.  She has her sister on the phone during the dressing change who is an Charity fundraiser and reports to her sister that she is pleased with the appearance of the wound and that there is no discomfort with the dressing change.  Bedside RN, Tu, is assisting me with today's change.  Next dressing change is scheduled for Friday, 6/24 if patient is still in house.  Thanks, Ladona Mow, MSN, RN, GNP, [place]  Pager# [phone]

## 2020-12-08 NOTE — Progress Notes (Signed)
Inpatient Diabetes Program Recommendations  AACE/ADA: New Consensus Statement on Inpatient Glycemic Control (2015)  Target Ranges:  Prepandial:   less than 140 mg/dL      Peak postprandial:   less than 180 mg/dL (1-2 hours)      Critically ill patients:  140 - 180 mg/dL   Lab Results  Component Value Date   GLUCAP 323 (H) 12/08/2020   HGBA1C 13.1 (H) 12/02/2020    Review of Glycemic Control Results for Valerie Le, Valerie Le (MRN [id number]) as of 12/08/2020 09:31  Ref. Range 12/07/2020 15:57 12/07/2020 21:09 12/08/2020 08:02  Glucose-Capillary Latest Ref Range: 70 - 99 mg/dL 035 (H) 465 (H) 681 (H)   Diabetes history: DM 2 Home DM Meds: Toujeo 48 units qhs      Humalog 12-42 units tid (counts carbs for meal time) 1 unit for every 6 gram of carbs + SSI   T Slim insulin pump/ Dexcom G6 (supplies come in starting July 1st)   Current Orders: Lantus 35 units Daily                            Novolog Moderate Correction Scale/ SSI (0-15 units) TID AC + HS                            Novolog 6 units TID with meals Inpatient Diabetes Program Recommendations:    Please consider increasing Lantus to 45 units daily.    Thanks,  Beryl Meager, RN, BC-ADM Inpatient Diabetes Coordinator Pager [phone] (8a-5p)

## 2020-12-08 NOTE — Progress Notes (Signed)
   Subjective: 4 Days Post-Op Procedure(s) (LRB): AMPUTATION RIGHT SECOND TOE, I&D FOOT (Right) Patient reports pain as mild.    Objective: Vital signs in last 24 hours: Temp:  [98.2 F (36.8 C)-98.5 F (36.9 C)] 98.5 F (36.9 C) (06/22 0525) Pulse Rate:  [89-95] 95 (06/22 0525) Resp:  [18] 18 (06/22 0525) BP: (148-150)/(68-88) 150/68 (06/22 0525) SpO2:  [94 %-96 %] 96 % (06/22 0525)  Intake/Output from previous day: 06/21 0701 - 06/22 0700 In: 2346.7 [I.V.:2046.7; IV Piggyback:300] Out: -  Intake/Output this shift: No intake/output data recorded.  Recent Labs    12/06/20 0737  HGB 10.4*   Recent Labs    12/06/20 0737  WBC 11.2*  RBC 4.24  HCT 32.0*  PLT 315   Recent Labs    12/06/20 0737  NA 135  K 4.3  CL 99  CO2 30  BUN 7  CREATININE 0.61  GLUCOSE 298*  CALCIUM 8.7*   No results for input(s): LABPT, INR in the last 72 hours.  VAC good seal No results found.  Assessment/Plan: 4 Days Post-Op Procedure(s) (LRB): AMPUTATION RIGHT SECOND TOE, I&D FOOT (Right) Plan:  cultures were reincubated for better growth.   Will need results for appropriate continued ABX treatment. Will have to have VAC supplies given to her so she can bring to my office for VAC changes. She is talking with Child psychotherapist about support options. VAC change today by [facility] RN.   Eldred Manges 12/08/2020, 7:35 AM

## 2020-12-08 NOTE — Plan of Care (Signed)
  Problem: Activity: Goal: Risk for activity intolerance will decrease Outcome: Progressing   Problem: Pain Managment: Goal: General experience of comfort will improve Outcome: Progressing   

## 2020-12-08 NOTE — Progress Notes (Signed)
Physical Therapy Treatment Patient Details Name: Valerie Le MRN: [id number] DOB: 1970-12-03 Today's Date: 12/08/2020    History of Present Illness 50 yo female admitted with diabetic foot infection/osteomyelitis. S/P I&D forefoot absceess, 2nd toe amputation 6/18. Hx of DM, obesity    PT Comments    AxO x3 and continues to be very motivated. Continued to emphasize importance of foot elevation while sitting, especially at EOB while working on computer. Assisted with amb with one crutch and one rail up and down stairs in stair well. Pt continues to plan to return home with VAC.    Follow Up Recommendations  No PT follow up     Equipment Recommendations  Rolling walker with 5" wheels    Recommendations for Other Services       Precautions / Restrictions Precautions Precautions: None Restrictions Weight Bearing Restrictions: No Other Position/Activity Restrictions: post op shoe    Mobility  Bed Mobility Overal bed mobility: Modified Independent             General bed mobility comments: Pt OOB in recliner    Transfers   Equipment used: Rolling walker (2 wheeled) Transfers: Sit to/from Stand Sit to Stand: Supervision         General transfer comment: good safety cognition  Ambulation/Gait Ambulation/Gait assistance: Supervision   Assistive device: Rolling walker (2 wheeled) Gait Pattern/deviations: Step-to pattern;Decreased stance time - right Gait velocity: decreased   General Gait Details: used RW and cued pt to keep weight thru heel to limit WBing on forefoot.   Stairs Stairs: Yes Stairs assistance: Min guard Stair Management: One rail Right;Step to pattern;Forwards;With crutches Number of Stairs: 12 General stair comments: Pt. ambulated with one crutch, come verbal cueing needed to maintain step to pattern ascending stairs   Wheelchair Mobility    Modified Rankin (Stroke Patients Only)       Balance                                             Cognition Arousal/Alertness: Awake/alert Behavior During Therapy: WFL for tasks assessed/performed Overall Cognitive Status: Within Functional Limits for tasks assessed                                 General Comments: AxO x 3 very motivated and sitting on EOB on her computer working      Exercises      General Comments        Pertinent Vitals/Pain Pain Assessment: No/denies pain    Home Living                      Prior Function            PT Goals (current goals can now be found in the care plan section) Acute Rehab PT Goals Patient Stated Goal: home soon PT Goal Formulation: With patient Time For Goal Achievement: 12/20/20 Potential to Achieve Goals: Good Progress towards PT goals: Progressing toward goals    Frequency    Min 3X/week      PT Plan Current plan remains appropriate    Co-evaluation              AM-PAC PT "6 Clicks" Mobility   Outcome Measure  Help needed turning from your back to your side while in a  flat bed without using bedrails?: None Help needed moving from lying on your back to sitting on the side of a flat bed without using bedrails?: None Help needed moving to and from a bed to a chair (including a wheelchair)?: A Little Help needed standing up from a chair using your arms (e.g., wheelchair or bedside chair)?: A Little Help needed to walk in hospital room?: A Little Help needed climbing 3-5 steps with a railing? : A Little 6 Click Score: 20    End of Session Equipment Utilized During Treatment: Gait belt Activity Tolerance: Patient tolerated treatment well Patient left: in chair;with call bell/phone within reach Nurse Communication: Mobility status PT Visit Diagnosis: Difficulty in walking, not elsewhere classified (R26.2)     Time: 1010-1035 PT Time Calculation (min) (ACUTE ONLY): 25 min  Charges:  $Gait Training: 8-22 mins $Therapeutic Activity: 8-22  mins                     Alma Friendly, PTA Student  Acute Rehabilitation Services Pager : [phone] Office : [phone]

## 2020-12-08 NOTE — Progress Notes (Signed)
PROGRESS NOTE    Valerie Le  ZOX:[id number] DOB: [birth year] DOA: 12/02/2020 PCP: [name], [name], PA   Brief Narrative: 50 year old with past medical history significant for insulin-dependent diabetes on insulin pump, run out of insulin in her pump supplies, unable to get it until July 1, history of hypertension, morbid obesity who presents [facility] complaining of worsening wound of her right foot.  Patient [facility], her wound  started in December, has a small ulceration on the top of her foot.  Worsening swelling of her foot and ankle the day of admission.  Her blood sugars has been 500 range.  Evaluation in the ED: Patient was noted to be tachycardic heart rate 116 tachypneic respiration rate 27.  Sodium 130, potassium 3.5 CO2 28, glucose 509.  Lactic acid 1.1, white blood cell 15.  X-ray of the right foot showed no fracture or dislocation,  swelling with soft tissue gas consistent with cellulitis.  Patient admitted with Diabetic foot infection, dorsal forefoot abscess and Osteomyelitis of the proximal phalanx of the right second toe. She has been getting IV antibiotics. She underwent I and D of forefoot abscess and amputation of right second toe. She presented with Hyperglycemia, hyperosmolar state. She was on insulin Gtt and subsequently transition to Lantus.    Assessment & Plan:   Principal Problem:   Diabetic foot infection (HCC) Active Problems:   Essential hypertension   Insulin-treated type 2 diabetes mellitus (HCC)   Asthma   ADHD (attention deficit hyperactivity disorder)   Bipolar affective disorder, currently depressed, moderate (HCC)   Morbid obesity due to excess calories (HCC)   Migraine   Tobacco abuse   Hyperlipidemia associated with type 2 diabetes mellitus (HCC)   1-Hyperosmolar, hyperglycemic state -Patient presented with hyperglycemia blood sugar 500, anion gap normal, CO2 28. -She was treated with insulin drip  and IV fluids. -She was transition to Lantus and a sliding scale insulin. -Plan to increase lantus to 35 units. Increase meals coverage. . SSI.  -patient is planning to resume insulin pump at discharge. Diabetes Educator consulted.  F/u with Dr Shawnee KnappLevy, reports run out pump supplies due to insurance issues, she is to get new insurance soon that will cover her diabetes needs  2-Diabetic foot infection, dorsal forefoot abscess, Osteomyelitis of the proximal phalanx of the right second toe.  -Continue with IV vancomycin and cefepime. ---Underwent debridement of dorsal left forefoot abscess and second toe amputation by Dr Ophelia CharterYates on 6/18. -Plan to continue with  wound VAC.  She will need wound vac at discharge/ CM [facility].  -Culture : gram-positive cocci. Awaiting cultures results to tailor antibiotics.    3-Hypertension: Continue with Norvasc and  lisinopril.   4-Hyperlipidemia: Continue with lipitor.   5-obesity: Body mass index is 30.4 kg/m. Needs life style modification.   Anemia; iron deficiency. Started oral iron.   Tobacco abuse: counseled.   History of migraine: On Sumatriptan.     DVT prophylaxis: Lovenox Code Status: Full code Family Communication: care discussed with patient.  Disposition Plan:  Status is: Inpatient  Remains inpatient appropriate because:Hemodynamically unstable   Dispo: The patient is from: Home              Anticipated d/c is to: Home              Patient currently is not medically stable to d/c. Awaiting for wound culture result, need ortho clearance, needs secure home health, no insurance, need match  letter for meds   Difficult to place patient No     Consultants:  Dr Ophelia Charter  Procedures:  None  Antimicrobials:    Subjective: Pain is better controlled today, had bmx2,  worked with physical therapist, wound vac supplies delivered, desires to go home .     Objective: Vitals:   12/06/20 2145 12/07/20 0606 12/07/20 2106 12/08/20  0525  BP: (!) 149/69 134/75 (!) 148/88 (!) 150/68  Pulse: 94 82 89 95  Resp: 18 20 18 18   Temp: 98.7 F (37.1 C) 98.1 F (36.7 C) 98.2 F (36.8 C) 98.5 F (36.9 C)  TempSrc: Oral Oral Oral Oral  SpO2: 96% 95% 94% 96%  Weight:      Height:        Intake/Output Summary (Last 24 hours) at 12/08/2020 1132 Last data filed at 12/08/2020 0559 Gross per 24 hour  Intake 2346.7 ml  Output --  Net 2346.7 ml   Filed Weights   12/02/20 1708 12/02/20 2254  Weight: 100.2 kg 98.9 kg    Examination:  General exam: NAD Respiratory system: CTA Cardiovascular system: S 1, S 2 RRR Gastrointestinal system: BS present, soft, nt Central nervous system: Alert Extremities: Symmetric power, wound vac in place.    Data Reviewed: I have personally reviewed following labs and imaging studies  CBC: Recent Labs  Lab 12/02/20 1906 12/02/20 2347 12/03/20 0031 12/04/20 0801 12/05/20 0355 12/06/20 0737  WBC 15.0* 13.5* 13.7* 10.3 11.1* 11.2*  NEUTROABS 11.6*  --   --   --   --   --   HGB 11.3* 9.9* 9.5* 10.1* 9.4* 10.4*  HCT 34.6* 30.7* 29.1* 31.6* 29.0* 32.0*  MCV 74.1* 75.6* 76.0* 76.1* 76.3* 75.5*  PLT 249 235 246 274 298 315   Basic Metabolic Panel: Recent Labs  Lab 12/03/20 1120 12/03/20 1502 12/04/20 0801 12/05/20 0355 12/06/20 0737  NA 138 137 136 137 135  K 4.0 4.2 4.3 3.9 4.3  CL 103 103 105 104 99  CO2 27 24 25 27 30   GLUCOSE 121* 166* 271* 141* 298*  BUN 11 10 10 7 7   CREATININE 0.56 0.80 0.59 0.51 0.61  CALCIUM 8.4* 8.3* 8.3* 8.4* 8.7*   GFR: Estimated Creatinine Clearance: 108.9 mL/min (by C-G formula based on SCr of 0.61 mg/dL). Liver Function Tests: Recent Labs  Lab 12/02/20 1906 12/03/20 0031  AST 10* 9*  ALT 13 12  ALKPHOS 116 99  BILITOT 0.5 0.3  PROT 7.8 6.8  ALBUMIN 2.9* 2.6*   No results for input(s): LIPASE, AMYLASE in the last 168 hours. No results for input(s): AMMONIA in the last 168 hours. Coagulation Profile: No results for input(s): INR,  PROTIME in the last 168 hours. Cardiac Enzymes: No results for input(s): CKTOTAL, CKMB, CKMBINDEX, TROPONINI in the last 168 hours. BNP (last 3 results) No results for input(s): PROBNP in the last 8760 hours. HbA1C: No results for input(s): HGBA1C in the last 72 hours.  CBG: Recent Labs  Lab 12/07/20 0723 12/07/20 1111 12/07/20 1557 12/07/20 2109 12/08/20 0802  GLUCAP 236* 245* 315* 208* 323*   Lipid Profile: No results for input(s): CHOL, HDL, LDLCALC, TRIG, CHOLHDL, LDLDIRECT in the last 72 hours. Thyroid Function Tests: No results for input(s): TSH, T4TOTAL, FREET4, T3FREE, THYROIDAB in the last 72 hours. Anemia Panel: No results for input(s): VITAMINB12, FOLATE, FERRITIN, TIBC, IRON, RETICCTPCT in the last 72 hours.  Sepsis Labs: Recent Labs  Lab 12/02/20 1906 12/02/20 2041  LATICACIDVEN 1.1 1.1  Recent Results (from the past 240 hour(s))  Resp Panel by RT-PCR (Flu A&B, Covid) Nasopharyngeal Swab     Status: None   Collection Time: 12/02/20  7:06 PM   Specimen: Nasopharyngeal Swab; Nasopharyngeal(NP) swabs in vial transport medium  Result Value Ref Range Status   SARS Coronavirus 2 by RT PCR NEGATIVE NEGATIVE Final    Comment: (NOTE) SARS-CoV-2 target nucleic acids are NOT DETECTED.  The SARS-CoV-2 RNA is generally detectable in upper respiratory specimens during the acute phase of infection. The lowest concentration of SARS-CoV-2 viral copies this assay can detect is 138 copies/mL. A negative result does not preclude SARS-Cov-2 infection and should not be used as the sole basis for treatment or other patient management decisions. A negative result may occur with  improper specimen collection/handling, submission of specimen other than nasopharyngeal swab, presence of viral mutation(s) within the areas targeted by this assay, and inadequate number of viral copies(<138 copies/mL). A negative result must be combined with clinical observations, patient history,  and epidemiological information. The expected result is Negative.  Fact Sheet for Patients:  BloggerCourse.com  Fact Sheet for Healthcare Providers:  SeriousBroker.it  This test is no t yet approved or cleared by the [place] FDA and  has been authorized for detection and/or diagnosis of SARS-CoV-2 by FDA under an Emergency Use Authorization (EUA). This EUA will remain  in effect (meaning this test can be used) for the duration of the COVID-19 declaration under Section 564(b)(1) of the Act, 21 U.S.C.section 360bbb-3(b)(1), unless the authorization is terminated  or revoked sooner.       Influenza A by PCR NEGATIVE NEGATIVE Final   Influenza B by PCR NEGATIVE NEGATIVE Final    Comment: (NOTE) The Xpert Xpress SARS-CoV-2/FLU/RSV plus assay is intended as an aid in the diagnosis of influenza from Nasopharyngeal swab specimens and should not be used as a sole basis for treatment. Nasal washings and aspirates are unacceptable for Xpert Xpress SARS-CoV-2/FLU/RSV testing.  Fact Sheet for Patients: BloggerCourse.com  Fact Sheet for Healthcare Providers: SeriousBroker.it  This test is not yet approved or cleared by the [place] FDA and has been authorized for detection and/or diagnosis of SARS-CoV-2 by FDA under an Emergency Use Authorization (EUA). This EUA will remain in effect (meaning this test can be used) for the duration of the COVID-19 declaration under Section 564(b)(1) of the Act, 21 U.S.C. section 360bbb-3(b)(1), unless the authorization is terminated or revoked.  Performed at [facility], [address] [place], [place] [address]   MRSA PCR Screening     Status: None   Collection Time: 12/02/20 10:49 PM  Result Value Ref Range Status   MRSA by PCR NEGATIVE NEGATIVE Final    Comment:        The GeneXpert MRSA Assay (FDA approved for NASAL  specimens only), is one component of a comprehensive MRSA colonization surveillance program. It is not intended to diagnose MRSA infection nor to guide or monitor treatment for MRSA infections. Performed at [facility], [address], [place], [place] [address]   Aerobic/Anaerobic Culture w Gram Stain (surgical/deep wound)     Status: None (Preliminary result)   Collection Time: 12/04/20  3:09 PM   Specimen: Wound  Result Value Ref Range Status   Specimen Description   Final    WOUND Performed at [facility], [address], [place], [place] [address]    Special Requests   Final    NONE Performed at  [facility], [address] [place], [place] [address]    Gram Stain   Final    ABUNDANT WBC PRESENT,BOTH PMN AND MONONUCLEAR RARE GRAM POSITIVE COCCI Performed at [facility], [address], [place], [place] [address]    Culture   Final    CULTURE REINCUBATED FOR BETTER GROWTH NO ANAEROBES ISOLATED; CULTURE IN PROGRESS FOR 5 DAYS    Report Status PENDING  Incomplete          Radiology Studies: No results found.      Scheduled Meds:  amLODipine  10 mg Oral Daily   aspirin EC  81 mg Oral Daily   atorvastatin  80 mg Oral QHS   Chlorhexidine Gluconate Cloth  6 each Topical Daily   cholecalciferol  1,000 Units Oral Daily   enoxaparin (LOVENOX) injection  40 mg Subcutaneous Q24H   ferrous sulfate  325 mg Oral Q breakfast   insulin aspart  0-15 Units Subcutaneous TID WC   insulin aspart  0-5 Units Subcutaneous QHS   insulin aspart  6 Units Subcutaneous TID WC   [START ON 12/09/2020] insulin glargine  45 Units Subcutaneous Daily   lisinopril  40 mg Oral Daily   polyethylene glycol  17 g Oral BID   senna-docusate  1 tablet Oral BID   Continuous Infusions:  sodium chloride 100 mL/hr at 12/07/20 2352   sodium chloride Stopped (12/03/20 1016)   ceFEPime (MAXIPIME) IV 2 g (12/08/20 0818)   vancomycin  HCl 1,000 mg (12/08/20 1109)     LOS: 6 days    Time spent: 25 minutes.     Albertine Grates, MD PhD FACP [facility]   If 7PM-7AM, please contact night-coverage [email or web address]  12/08/2020, 11:32 AM

## 2020-12-09 LAB — CREATININE, SERUM
Creatinine, Ser: 0.44 mg/dL (ref 0.44–1.00)
GFR, Estimated: >60 mL/min (ref >60)

## 2020-12-09 LAB — GLUCOSE, CAPILLARY
Glucose-Capillary: 169 mg/dL — ABNORMAL HIGH (ref 70–99)
Glucose-Capillary: 277 mg/dL — ABNORMAL HIGH (ref 70–99)
Glucose-Capillary: 177 mg/dL — ABNORMAL HIGH (ref 70–99)

## 2020-12-09 MED ORDER — AMOXICILLIN 500 MG PO CAPS: 500.0000 mg | ORAL_CAPSULE | Freq: Two times a day (BID) | ORAL | 1 refills | Status: DC

## 2020-12-09 MED ORDER — FERROUS SULFATE 325 (65 FE) MG PO TABS: 325.0000 mg | ORAL_TABLET | Freq: Every day | ORAL | 0 refills | Status: DC

## 2020-12-09 MED ORDER — ONDANSETRON HCL 4 MG PO TABS: 4.0000 mg | ORAL_TABLET | Freq: Four times a day (QID) | ORAL | 0 refills | Status: DC | PRN

## 2020-12-09 MED ORDER — AMOXICILLIN 500 MG PO CAPS
500.0000 mg | ORAL_CAPSULE | Freq: Three times a day (TID) | ORAL | Status: DC
  Administered 2020-12-09: 500 mg via ORAL
  Filled 2020-12-09 (×2): qty 1

## 2020-12-09 MED ORDER — ONDANSETRON HCL 4 MG PO TABS: 4.0000 mg | ORAL_TABLET | Freq: Four times a day (QID) | ORAL | 0 refills | Status: AC | PRN

## 2020-12-09 MED ORDER — OXYCODONE-ACETAMINOPHEN 5-325 MG PO TABS: 1.0000 | ORAL_TABLET | ORAL | 0 refills | Status: DC | PRN

## 2020-12-09 NOTE — Progress Notes (Signed)
   Subjective: 5 Days Post-Op Procedure(s) (LRB): AMPUTATION RIGHT SECOND TOE, I&D FOOT (Right) Patient reports pain as mild.    Objective: Vital signs in last 24 hours: Temp:  [97.8 F (36.6 C)-98.9 F (37.2 C)] 98.9 F (37.2 C) (06/23 0641) Pulse Rate:  [78-93] 82 (06/23 0641) Resp:  [16-18] 18 (06/23 0641) BP: (143-159)/(84-86) 143/84 (06/23 0641) SpO2:  [95 %-99 %] 96 % (06/23 0641)  Intake/Output from previous day: 06/22 0701 - 06/23 0700 In: 940 [P.O.:940] Out: -  Intake/Output this shift: No intake/output data recorded.  No results for input(s): HGB in the last 72 hours. No results for input(s): WBC, RBC, HCT, PLT in the last 72 hours. Recent Labs    12/09/20 0312  CREATININE 0.44   No results for input(s): LABPT, INR in the last 72 hours.  VAC good seal.  No results found.  Assessment/Plan: 5 Days Post-Op Procedure(s) (LRB): AMPUTATION RIGHT SECOND TOE, I&D FOOT (Right) Plan:  OK for discharge from Ortho standpoint on amoxicillin 500mg  po bid times 2 wks .   Cultures rare actinomyces neuii.  She can come to my office on Friday for VAC change.   Tuesday 12/09/2020, 7:43 AM

## 2020-12-09 NOTE — TOC Transition Note (Signed)
Transition of Care [facility]) - CM/SW Discharge Note   Patient Details  Name: Valerie Le MRN: [id number] Date of Birth: 07-Nov-1970  [facility] CM/SW Contact:  Amada Jupiter, LCSW Phone Number: 12/09/2020, 3:12 PM   Clinical Narrative:    Pt medically cleared for dc home today.  Pt fully understands she will be going to Dr. Ophelia Charter office M-W-F to have VAC dressing changes and she will need to take supplies.   In addition to the wound VAC, have also ordered a rolling walker and tub seat via [employer].  No further TOC needs.   Final next level of care: Home/Self Care Barriers to Discharge: Inadequate or no insurance, No Home Care Agency will accept this patient   Patient Goals and CMS Choice Patient states their goals for this hospitalization and ongoing recovery are:: to go home CMS Medicare.gov Compare Post Acute Care list provided to:: Patient    Discharge Placement                       Discharge Plan and Services   Discharge Planning Services: CM Consult            DME Arranged: Vac; rolling walker and tub seat DME Agency: [employer]; [employer] Date DME Agency Contacted: 12/06/20 Time DME Agency Contacted: 1500 Representative spoke with at DME Agency: Blossom Hoops            Social Determinants of Health (SDOH) Interventions     Readmission Risk Interventions Readmission Risk Prevention Plan 12/06/2020  Post Dischage Appt Complete  Medication Screening Complete  Transportation Screening Complete

## 2020-12-09 NOTE — Plan of Care (Signed)
  Problem: Activity: Goal: Risk for activity intolerance will decrease Outcome: Progressing   Problem: Pain Managment: Goal: General experience of comfort will improve Outcome: Progressing   Problem: Safety: Goal: Ability to remain free from injury will improve Outcome: Progressing   

## 2020-12-09 NOTE — Discharge Instructions (Signed)
OK for weight bearing as tolerated. You will have VAC changes Monday , Wednesday , Friday at Dr. Ophelia Charter office . Make sure you bring supplies for the VAC change.  OK to do desk work . Make sure you keep your diabetes under good control so the foot will heal properly.

## 2020-12-09 NOTE — Progress Notes (Signed)
Discharge package printed and instructions given to patient. Pt verbalizes understanding. Switched to [facility] portable wound VAC. Await on walker and shower seat, ID which ordered and notified per SW to deliver before discharge.

## 2020-12-09 NOTE — Discharge Summary (Signed)
Discharge Summary  Valerie Le VOH:[id number] DOB: [birth year]  PCP: Maud Deed, PA  Admit date: 12/02/2020 Discharge date: 12/09/2020  Time spent: , more than 50% time spent on coordination of care.   Recommendations for Outpatient Follow-up:  F/u with PCP within a week  for hospital discharge follow up, repeat cbc/bmp at follow up F/u with ortho Dr Ophelia Charter F/u with endocrinology Dr Shawnee Knapp F/u with ID Dr Elinor Parkinson     Discharge Diagnoses:  Active Hospital Problems   Diagnosis Date Noted   Diabetic foot infection (HCC) 12/02/2020   Essential hypertension 12/02/2020   Hyperlipidemia associated with type 2 diabetes mellitus (HCC) 08/21/2018   Bipolar affective disorder, currently depressed, moderate (HCC) 01/23/2017   Morbid obesity due to excess calories (HCC) 02/15/2016   ADHD (attention deficit hyperactivity disorder) 03/07/2013   Asthma 03/07/2013   Insulin-treated type 2 diabetes mellitus (HCC) 03/07/2013   Migraine 03/07/2013   Tobacco abuse 03/07/2013    Resolved Hospital Problems  No resolved problems to display.    Discharge Condition: stable  Diet recommendation: carb modified  Filed Weights   12/02/20 1708 12/02/20 2254  Weight: 100.2 kg 98.9 kg    History of present illness: ( per admitting MD Dr Mikeal Hawthorne) Chief Complaint: Right foot swelling   HPI: Valerie Le is a 50 y.o. female with medical history significant of insulin-dependent diabetes on insulin pump who is out of her pump and will not be able to get it till July 1, essential hypertension, hyperlipidemia, morbid obesity who presents to med [facility] with wound at the top of her right foot.  This started in December and has been on and off.  She went to the [facility] wound center where they have been trying to manage it.  It started as a small ulceration at the top.  She has noted worsening swelling of the foot redness rise all the way to the ankle today.  She has not been on  antibiotics lately.  The symptoms got worse suddenly in the last 2 days.  Her blood sugar was noted to be in the 500s.  Patient reported not being able to feel her insulin pump.  She is not in DKA but appears to have hyperosmolar hyperglycemia.  With the wound appears to have significant cellulitis.  Patient admitted for IV antibiotics and blood sugar control..   ED Course: Temperature 98.4, blood pressure 135/80, pulse 116, respiratory 27 oxygen status 96% on room air.  Sodium 130, potassium 3.5 chloride 92 CO2 28 glucose is 509 BUN 13 creatinine 0.75 calcium 8.5.  Lactic acid is 1.1.  White count 15.1 hemoglobin 11.3 platelets 243.  COVID-19 screen negative.  Blood cultures were obtained.  X-ray of the foot showed no fracture or dislocation no evidence of osteomyelitis on x-ray.  Patient admitted with diabetic foot infection and also as well as hypoglycemia.  Hospital Course:  Principal Problem:   Diabetic foot infection (HCC) Active Problems:   Essential hypertension   Insulin-treated type 2 diabetes mellitus (HCC)   Asthma   ADHD (attention deficit hyperactivity disorder)   Bipolar affective disorder, currently depressed, moderate (HCC)   Morbid obesity due to excess calories (HCC)   Migraine   Tobacco abuse   Hyperlipidemia associated with type 2 diabetes mellitus (HCC)   Hyperosmolar, hyperglycemic state -a1c 13% -Patient presented with hyperglycemia blood sugar 500, anion gap normal, CO2 28. -She was treated with insulin drip and IV fluids. -She was transition to Lantus and a sliding scale  insulin. -resume home insulin regiment, F/u with Dr Shawnee Knapp, she is to get new insurance soon that will cover her diabetes meds/ supply needs   Diabetic foot infection, dorsal forefoot abscess, Osteomyelitis of the proximal phalanx of the right second toe. -she received  IV vancomycin and cefepime.n the hospital ---Underwent debridement of dorsal right forefoot abscess and second toe amputation by  Dr Ophelia Charter on 6/18. -Plan to continue with  wound VAC to be changed at Dr Ophelia Charter office MWF -wound Culture : rare actinomyces, final culture still in process, case discussed with ortho and ID, patient is cleared to discharge home on amoxicillin, f/u with ortho and ID closely      Hypertension: Continue with Norvasc and  lisinopril.   Hyperlipidemia: Continue with lipitor.   obesity: Body mass index is 30.4 kg/m. Needs life style modification.   Anemia; iron deficiency. Started oral iron.   Tobacco abuse: counseled.   History of migraine: On Sumatriptan.       DVT prophylaxis while in the hospital : Lovenox Code Status: Full code    Procedures: Underwent debridement of dorsal right forefoot abscess and second toe amputation by Dr Ophelia Charter on 6/18.  Consultations: Ortho ID  Discharge Exam: BP (!) 143/84 (BP Location: Right Arm)   Pulse 82   Temp 98.9 F (37.2 C)   Resp 18   Ht 5\' 11"  (1.803 m)   Wt 98.9 kg   SpO2 96%   BMI 30.40 kg/m   General: NAD, pleasant, wound vac attached to right foot Cardiovascular: RRR Respiratory: normal respiratory effort  Discharge Instructions You were cared for by a hospitalist during your hospital stay. If you have any questions about your discharge medications or the care you received while you were in the hospital after you are discharged, you can call the unit and asked to speak with the hospitalist on call if the hospitalist that took care of you is not available. Once you are discharged, your primary care physician will handle any further medical issues. Please note that NO REFILLS for any discharge medications will be authorized once you are discharged, as it is imperative that you return to your primary care physician (or establish a relationship with a primary care physician if you do not have one) for your aftercare needs so that they can reassess your need for medications and monitor your lab values.  Discharge Instructions      Diet Carb Modified   Complete by: As directed    Discharge wound care:   Complete by: As directed    Change wound vac every MWF   Increase activity slowly   Complete by: As directed       Allergies as of 12/09/2020       Reactions   Coconut Oil Anaphylaxis   Pineapple Anaphylaxis   Tomato Anaphylaxis   Latex Hives   Metformin And Related Other (See Comments)   Constipation/cramps        Medication List     TAKE these medications    amLODipine 10 MG tablet Commonly known as: NORVASC Take 10 mg by mouth daily.   amoxicillin 500 MG capsule Commonly known as: AMOXIL Take 1 capsule (500 mg total) by mouth 2 (two) times daily.   aspirin EC 81 MG tablet Take 81 mg by mouth at bedtime. Swallow whole.   atorvastatin 80 MG tablet Commonly known as: LIPITOR Take 80 mg by mouth at bedtime.   cholecalciferol 25 MCG (1000 UNIT) tablet Commonly known as: VITAMIN  D3 Take 1,000 Units by mouth daily.   ferrous sulfate 325 (65 FE) MG tablet Take 1 tablet (325 mg total) by mouth daily with breakfast. Start taking on: December 10, 2020   ibuprofen 200 MG tablet Commonly known as: ADVIL Take 600 mg by mouth every 6 (six) hours as needed for mild pain.   insulin lispro 100 UNIT/ML injection Commonly known as: HUMALOG Inject into the skin 3 (three) times daily before meals. Gives between 12-42 units   insulin pump Soln Inject into the skin. Novolog 100 units/ml   lisinopril 40 MG tablet Commonly known as: ZESTRIL Take 40 mg by mouth daily.   metoCLOPramide 10 MG tablet Commonly known as: REGLAN Take 10 mg by mouth every 6 (six) hours as needed for nausea.   ondansetron 4 MG tablet Commonly known as: ZOFRAN Take 1 tablet (4 mg total) by mouth every 6 (six) hours as needed for nausea.   oxyCODONE-acetaminophen 5-325 MG tablet Commonly known as: Percocet Take 1 tablet by mouth every 4 (four) hours as needed for severe pain.   SUMAtriptan 25 MG tablet Commonly known  as: IMITREX Take 25 mg by mouth every 2 (two) hours as needed for migraine. May repeat in 2 hours if headache persists or recurs.   TOUJEO SOLOSTAR [place] Inject 48 Units into the skin at bedtime.               Durable Medical Equipment  (From admission, onward)           Start     Ordered   12/09/20 0958  For home use only DME Shower stool  Once        12/09/20 7846   12/09/20 0852  For home use only DME Walker rolling  Once       Question Answer Comment  Walker: With 5 Inch Wheels   Patient needs a walker to treat with the following condition Diabetic foot infection (HCC)      12/09/20 0852              Discharge Care Instructions  (From admission, onward)           Start     Ordered   12/09/20 0000  Discharge wound care:       Comments: Change wound vac every MWF   12/09/20 1215           Allergies  Allergen Reactions   Coconut Oil Anaphylaxis   Pineapple Anaphylaxis   Tomato Anaphylaxis   Latex Hives   Metformin And Related Other (See Comments)    Constipation/cramps    Follow-up Information     Mission, Cristie Hem, PA Follow up in 1 week(s).   Specialty: Physician Assistant Why: hospital discharge follow up Contact information: [address] [place] [address] [phone]         Betsey Amen, MD Follow up.   Specialty: Endocrinology Why: for diabetes control Contact information: [address] [place] [address] [phone]         Eldred Manges, MD Follow up in 1 day(s).   Specialty: Orthopedic Surgery Why: friday for [facility] change make sure you bring supplies for change Contact information: [address] [place] [address] [phone]                  The results of significant diagnostics from this hospitalization (including imaging, microbiology, ancillary and laboratory) are listed below for reference.    Significant  Diagnostic Studies: MR FOOT RIGHT  WO CONTRAST  Result Date: 12/03/2020 CLINICAL DATA:  Acute foot pain.  Infection suspected EXAM: MRI OF THE RIGHT FOREFOOT WITHOUT CONTRAST TECHNIQUE: Multiplanar, multisequence MR imaging of the right forefoot was performed. No intravenous contrast was administered. COMPARISON:  X-ray 12/02/2020 FINDINGS: Bones/Joint/Cartilage Bone marrow edema throughout the proximal phalanx of the right second toe with intermediate T1 marrow signal compatible with acute osteomyelitis (series 6, images 11-13). Bone marrow edema within the middle phalanx of the second toe with preservation of the fatty T1 bone marrow signal. Preserved signal within the second metatarsal head. Remaining osseous structures are within normal limits. No fracture or dislocation. Small joint effusion at the first MTP joint, nonspecific. Ligaments Intact Lisfranc ligament. Collateral ligaments of the forefoot appear intact. Muscles and Tendons Diffuse intramuscular edema suggesting myositis. Soft tissues Fluid and air containing collection predominantly located within the dorsal soft tissues of the forefoot extending to the base of the second toe measuring approximately 7.7 x 1.7 x 2.9 cm (series 5, image 11; series 4, image 19). Fluid collection extends into the first intermetatarsal space (series 4, image 20). Diffuse subcutaneous edema throughout the foot. Small collections of fluid noted within the cutaneous aspect of the dorsal forefoot suggest sites of skin blistering. IMPRESSION: 1. Acute osteomyelitis of the proximal phalanx of the right second toe. 2. Bone marrow edema within the middle phalanx of the second toe may represent reactive osteitis versus early acute osteomyelitis. 3. Fluid and air containing collection predominantly located within the dorsal soft tissues of the forefoot extending to the base of the second toe measuring up to 7.7 x 1.7 x 2.9 cm compatible with abscess. Fluid collection extends into the first intermetatarsal space.  4. Small first MTP joint effusion which may be reactive or represent septic arthritis. 5. Diffuse intramuscular edema suggesting myositis. Electronically Signed   By: Duanne Guess D.O.   On: 12/03/2020 20:57   DG Foot Complete Right  Result Date: 12/02/2020 CLINICAL DATA:  Foot ulcer.  Hyperglycemia EXAM: RIGHT FOOT COMPLETE - 3+ VIEW COMPARISON:  None. FINDINGS: No evidence of acute fracture or dislocation in the right foot. No focal bone lesion or cortical changes to suggest osteomyelitis. 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 Mild dorsal soft tissue swelling with soft tissue gas demonstrated over the dorsum of the forefoot. This suggests cellulitis with gas-forming organism. No radiopaque soft tissue foreign bodies. IMPRESSION: No acute fracture or dislocation. Soft tissue swelling with soft tissue gas consistent with cellulitis. No radiographic changes of osteomyelitis. Electronically Signed   By: Burman Nieves M.D.   On: 12/02/2020 18:02    Microbiology: Recent Results (from the past 240 hour(s))  Resp Panel by RT-PCR (Flu A&B, Covid) Nasopharyngeal Swab     Status: None   Collection Time: 12/02/20  7:06 PM   Specimen: Nasopharyngeal Swab; Nasopharyngeal(NP) swabs in vial transport medium  Result Value Ref Range Status   SARS Coronavirus 2 by RT PCR NEGATIVE NEGATIVE Final    Comment: (NOTE) SARS-CoV-2 target nucleic acids are NOT DETECTED.  The SARS-CoV-2 RNA is generally detectable in upper respiratory specimens during the acute phase of infection. The lowest concentration of SARS-CoV-2 viral copies this assay can detect is 138 copies/mL. A negative result does not preclude SARS-Cov-2 infection and should not be used as the sole basis for treatment or other patient management decisions. A negative result may occur with  improper specimen collection/handling, submission of specimen other  than nasopharyngeal swab, presence of  viral mutation(s) within the areas targeted by this assay, and inadequate number of viral copies(<138 copies/mL). A negative result must be combined with clinical observations, patient history, and epidemiological information. The expected result is Negative.  Fact Sheet for Patients:  BloggerCourse.com  Fact Sheet for Healthcare Providers:  SeriousBroker.it  This test is no t yet approved or cleared by the [place] FDA and  has been authorized for detection and/or diagnosis of SARS-CoV-2 by FDA under an Emergency Use Authorization (EUA). This EUA will remain  in effect (meaning this test can be used) for the duration of the COVID-19 declaration under Section 564(b)(1) of the Act, 21 U.S.C.section 360bbb-3(b)(1), unless the authorization is terminated  or revoked sooner.       Influenza A by PCR NEGATIVE NEGATIVE Final   Influenza B by PCR NEGATIVE NEGATIVE Final    Comment: (NOTE) The Xpert Xpress SARS-CoV-2/FLU/RSV plus assay is intended as an aid in the diagnosis of influenza from Nasopharyngeal swab specimens and should not be used as a sole basis for treatment. Nasal washings and aspirates are unacceptable for Xpert Xpress SARS-CoV-2/FLU/RSV testing.  Fact Sheet for Patients: BloggerCourse.com  Fact Sheet for Healthcare Providers: SeriousBroker.it  This test is not yet approved or cleared by the [place] FDA and has been authorized for detection and/or diagnosis of SARS-CoV-2 by FDA under an Emergency Use Authorization (EUA). This EUA will remain in effect (meaning this test can be used) for the duration of the COVID-19 declaration under Section 564(b)(1) of the Act, 21 U.S.C. section 360bbb-3(b)(1), unless the authorization is terminated or revoked.  Performed at [facility], [address] [place], [place] [address]   MRSA PCR Screening      Status: None   Collection Time: 12/02/20 10:49 PM  Result Value Ref Range Status   MRSA by PCR NEGATIVE NEGATIVE Final    Comment:        The GeneXpert MRSA Assay (FDA approved for NASAL specimens only), is one component of a comprehensive MRSA colonization surveillance program. It is not intended to diagnose MRSA infection nor to guide or monitor treatment for MRSA infections. Performed at [facility], [address] [place], [place] [address]   Aerobic/Anaerobic Culture w Gram Stain (surgical/deep wound)     Status: None (Preliminary result)   Collection Time: 12/04/20  3:09 PM   Specimen: Wound  Result Value Ref Range Status   Specimen Description   Final    WOUND Performed at [facility], [address], [place], [place] [address]    Special Requests   Final    NONE Performed at [facility], [address], [place], [place] [address]    Gram Stain   Final    ABUNDANT WBC PRESENT,BOTH PMN AND MONONUCLEAR RARE GRAM POSITIVE COCCI Performed at [facility], [address] [place], [place] [address]    Culture   Final    RARE ACTINOMYCES NEUII Standardized susceptibility testing for this organism is not available. NO ANAEROBES ISOLATED; CULTURE IN PROGRESS FOR 5 DAYS    Report Status PENDING  Incomplete     Labs: Basic Metabolic Panel: Recent Labs  Lab 12/03/20 1120 12/03/20 1502 12/04/20 0801 12/05/20 0355 12/06/20 0737 12/09/20 0312  NA 138 137 136 137 135  --   K 4.0 4.2 4.3 3.9 4.3  --   CL 103 103 105 104 99  --   CO2  27 24 25 27 30   --   GLUCOSE 121* 166* 271* 141* 298*  --   BUN 11 10 10 7 7   --   CREATININE 0.56 0.80 0.59 0.51 0.61 0.44  CALCIUM 8.4* 8.3* 8.3* 8.4* 8.7*  --    Liver Function Tests: Recent Labs  Lab 12/02/20 1906 12/03/20 0031  AST 10* 9*  ALT 13 12  ALKPHOS 116 99  BILITOT 0.5 0.3  PROT 7.8 6.8  ALBUMIN 2.9* 2.6*   No results for input(s): LIPASE,  AMYLASE in the last 168 hours. No results for input(s): AMMONIA in the last 168 hours. CBC: Recent Labs  Lab 12/02/20 1906 12/02/20 2347 12/03/20 0031 12/04/20 0801 12/05/20 0355 12/06/20 0737  WBC 15.0* 13.5* 13.7* 10.3 11.1* 11.2*  NEUTROABS 11.6*  --   --   --   --   --   HGB 11.3* 9.9* 9.5* 10.1* 9.4* 10.4*  HCT 34.6* 30.7* 29.1* 31.6* 29.0* 32.0*  MCV 74.1* 75.6* 76.0* 76.1* 76.3* 75.5*  PLT 249 235 246 274 298 315   Cardiac Enzymes: No results for input(s): CKTOTAL, CKMB, CKMBINDEX, TROPONINI in the last 168 hours. BNP: BNP (last 3 results) No results for input(s): BNP in the last 8760 hours.  ProBNP (last 3 results) No results for input(s): PROBNP in the last 8760 hours.  CBG: Recent Labs  Lab 12/08/20 1144 12/08/20 1649 12/08/20 2159 12/09/20 0710 12/09/20 1217  GLUCAP 288* 225* 126* 169* 277*       Signed:  Albertine GratesFang Bryer Gottsch MD, PhD, FACP  [facility] 12/09/2020, 12:27 PM

## 2020-12-09 NOTE — Progress Notes (Signed)
Physical Therapy Treatment Patient Details Name: Valerie Le MRN: [id number] DOB: 1971-02-14 Today's Date: 12/09/2020    History of Present Illness 50 yo female admitted with diabetic foot infection/osteomyelitis. S/P I&D forefoot absceess, 2nd toe amputation 6/18. Hx of DM, obesity    PT Comments    POD # 5 Pt in bed on computer feeling "ready to go home".  Pt wanted to practice stairs again.  "Not sure on the going down part".  Assisted OOB to amb in hallway and practice stairs.  General stair comments: using one crutch and one rail at 25% VC's on proper tech, sequencing and safety.   Pt feeling good about going home today.  Follow Up Recommendations  No PT follow up     Equipment Recommendations  Rolling walker with 5" wheels    Recommendations for Other Services       Precautions / Restrictions Precautions Precautions: Fall Precaution Comments: wound VAC Restrictions Weight Bearing Restrictions: No Other Position/Activity Restrictions: post op shoe  WB thru heel    Mobility  Bed Mobility Overal bed mobility: Modified Independent             General bed mobility comments: increased time self able    Transfers Overall transfer level: Needs assistance Equipment used: Rolling walker (2 wheeled) Transfers: Sit to/from Stand Sit to Stand: Supervision         General transfer comment: good safety cognition  Ambulation/Gait Ambulation/Gait assistance: Supervision Gait Distance (Feet): 65 Feet Assistive device: Rolling walker (2 wheeled) Gait Pattern/deviations: Step-to pattern;Decreased stance time - right Gait velocity: decreased   General Gait Details: used RW and cued pt to keep weight thru heel to limit WBing on forefoot.  Toloerating a functional distance with no pain reported this session.   Stairs Stairs: Yes Stairs assistance: Min guard;Supervision Stair Management: One rail Right;Step to pattern;Forwards;With crutches Number of  Stairs: 4 General stair comments: using one crutch and one rail at 25% VC's on proper tech, sequencing and safety   Wheelchair Mobility    Modified Rankin (Stroke Patients Only)       Balance                                            Cognition Arousal/Alertness: Awake/alert Behavior During Therapy: WFL for tasks assessed/performed Overall Cognitive Status: Within Functional Limits for tasks assessed                                 General Comments: AxO x 3 very motivated and sitting on EOB on her computer working      Exercises      General Comments        Pertinent Vitals/Pain Pain Assessment: 0-10 Pain Score: 0-No pain Pain Location: R foot Pain Descriptors / Indicators: Guarding Pain Intervention(s): Monitored during session;Repositioned    Home Living                      Prior Function            PT Goals (current goals can now be found in the care plan section)      Frequency    Min 3X/week      PT Plan Current plan remains appropriate    Co-evaluation  AM-PAC PT "6 Clicks" Mobility   Outcome Measure  Help needed turning from your back to your side while in a flat bed without using bedrails?: None Help needed moving from lying on your back to sitting on the side of a flat bed without using bedrails?: None Help needed moving to and from a bed to a chair (including a wheelchair)?: A Little Help needed standing up from a chair using your arms (e.g., wheelchair or bedside chair)?: A Little Help needed to walk in hospital room?: A Little Help needed climbing 3-5 steps with a railing? : A Little 6 Click Score: 20    End of Session Equipment Utilized During Treatment: Gait belt Activity Tolerance: Patient tolerated treatment well Patient left: in bed;with call bell/phone within reach Nurse Communication: Mobility status (pt ready for D/C to home) PT Visit Diagnosis: Difficulty in  walking, not elsewhere classified (R26.2) Pain - Right/Left: Right Pain - part of body: Ankle and joints of foot     Time: 1110-1125 PT Time Calculation (min) (ACUTE ONLY): 15 min  Charges:  $Gait Training: 8-22 mins                     Felecia Shelling  PTA Acute  Rehabilitation Services Pager      [phone] Office      [phone]

## 2020-12-10 ENCOUNTER — Ambulatory Visit: Payer: BLUE CROSS/BLUE SHIELD

## 2020-12-10 DIAGNOSIS — L089 Local infection of the skin and subcutaneous tissue, unspecified: Secondary | ICD-10-CM

## 2020-12-10 DIAGNOSIS — E11628 Type 2 diabetes mellitus with other skin complications: Secondary | ICD-10-CM

## 2020-12-10 NOTE — Progress Notes (Signed)
Patient is s/p a right foot I&D second toe amputation on 12/04/20 with Dr. Ophelia Charter. She is here today for a wound vac dressing change.  Upon removal of the dressing asked Dr. Lajoyce Corners to come and evaluate the wound. The patient has palpable pulse biphasic by doppler with exposed bone and necrotic tissue. Dr, Lajoyce Corners debrided this back to bleeding tissue and rep applied the wound vac. Would like to see if this can get granulation tissue to cover the bone. All questions were encouraged and answered by Dr. Lajoyce Corners. Patient has follow up on Monday with Dr. Ophelia Charter. Will call with any questions.   Valerie Le, RMA, [employer]

## 2020-12-11 LAB — AEROBIC/ANAEROBIC CULTURE W GRAM STAIN (SURGICAL/DEEP WOUND)

## 2020-12-13 ENCOUNTER — Ambulatory Visit (INDEPENDENT_AMBULATORY_CARE_PROVIDER_SITE_OTHER): Payer: BLUE CROSS/BLUE SHIELD | Admitting: Orthopedic Surgery

## 2020-12-13 ENCOUNTER — Encounter: Payer: Self-pay | Admitting: Orthopedic Surgery

## 2020-12-13 DIAGNOSIS — M86271 Subacute osteomyelitis, right ankle and foot: Secondary | ICD-10-CM | POA: Diagnosis not present

## 2020-12-13 DIAGNOSIS — E11628 Type 2 diabetes mellitus with other skin complications: Secondary | ICD-10-CM | POA: Diagnosis not present

## 2020-12-13 DIAGNOSIS — L089 Local infection of the skin and subcutaneous tissue, unspecified: Secondary | ICD-10-CM

## 2020-12-13 NOTE — Progress Notes (Signed)
Office Visit Note   Patient: Valerie Le           Date of Birth: 08-01-1970           MRN: [id number] Visit Date: 12/13/2020              Requested by: Maud Deed, [place] [address] [place],  [place] [address] PCP: Maud Deed, [place]  Chief Complaint  Patient presents with   Right Foot - Routine Post Op      HPI: Patient is a 50 year old woman who is seen for initial evaluation for ulceration right foot.  She is status post a right foot second ray amputation she has been undergoing wound VAC changes and presents with progressive ischemic changes to the distal aspect of the incision with exposed bone.  Assessment & Plan: Visit Diagnoses:  1. Diabetic foot infection (HCC)   2. Subacute osteomyelitis of right foot (HCC)     Plan: With the ischemic changes and exposed bone patient will require further surgical intervention.  Discussed that we could proceed with a transmetatarsal amputation for foot salvage intervention.  Discussed that with her microcirculatory disease that she is at increased risk of the incision not healing and potential for a transtibial amputation.  Patient states she understands she would like to proceed with surgery in early July.  We will set this up at her convenience as outpatient surgery discussed the importance of strict nonweightbearing after surgery.  Discussed the importance of starting a protein supplement at this time with 30 g of protein as a morning and afternoon snack.  Follow-Up Instructions: Return in about 3 weeks (around 01/03/2021).   Ortho Exam  Patient is alert, oriented, no adenopathy, well-dressed, normal affect, normal respiratory effort. Examination patient has a palpable dorsalis pedis pulse she does have ischemic areas dorsally over the foot with ischemic skin areas over the lateral and mid aspect of her foot these do not appear to be secondary to the wound VAC dressing do not appear to be secondary  to the wound VAC drainage tube.  A Doppler was used and patient has a biphasic dorsalis pedis and posterior tibial pulse.  The wound is necrotic distal half and the proximal aspect does have some granulation tissue but minimal.  The second metatarsal is white ischemic and exposed.  Most recent hemoglobin A1c was 13.1.  Imaging: No results found. No images are attached to the encounter.  Labs: Lab Results  Component Value Date   HGBA1C 13.1 (H) 12/02/2020   REPTSTATUS 12/11/2020 FINAL 12/04/2020   GRAMSTAIN  12/04/2020    ABUNDANT WBC PRESENT,BOTH PMN AND MONONUCLEAR RARE GRAM POSITIVE COCCI Performed at [facility], [address], [place], [place] [address]    CULT  12/04/2020    RARE ACTINOMYCES NEUII Standardized susceptibility testing for this organism is not available. RARE FINEGOLDIA MAGNA      Lab Results  Component Value Date   ALBUMIN 2.6 (L) 12/03/2020   ALBUMIN 2.9 (L) 12/02/2020    No results found for: MG No results found for: VD25OH  No results found for: PREALBUMIN CBC EXTENDED Latest Ref Rng & Units 12/06/2020 12/05/2020 12/04/2020  WBC 4.0 - 10.5 K/uL 11.2(H) 11.1(H) 10.3  RBC 3.87 - 5.11 MIL/uL 4.24 3.80(L) 4.15  HGB 12.0 - 15.0 g/dL 10.4(L) 9.4(L) 10.1(L)  HCT 36.0 - 46.0 % 32.0(L) 29.0(L) 31.6(L)  PLT 150 - 400 K/uL 315 298 274  NEUTROABS 1.7 - 7.7 K/uL - - -  LYMPHSABS 0.7 - 4.0 K/uL - - -     There is no height or weight on file to calculate BMI.  Orders:  No orders of the defined types were placed in this encounter.  No orders of the defined types were placed in this encounter.    Procedures: No procedures performed  Clinical Data: No additional findings.  ROS:  All other systems negative, except as noted in the HPI. Review of Systems  Objective: Vital Signs: There were no vitals taken for this visit.  Specialty Comments:  No specialty comments available.  PMFS History: Patient Active Problem List   Diagnosis Date  Noted   Diabetic foot infection (HCC) 12/02/2020   Essential hypertension 12/02/2020   Hyperlipidemia associated with type 2 diabetes mellitus (HCC) 08/21/2018   Bipolar affective disorder, currently depressed, moderate (HCC) 01/23/2017   Morbid obesity due to excess calories (HCC) 02/15/2016   Insulin-treated type 2 diabetes mellitus (HCC) 03/07/2013   Asthma 03/07/2013   ADHD (attention deficit hyperactivity disorder) 03/07/2013   Migraine 03/07/2013   Tobacco abuse 03/07/2013   Past Medical History:  Diagnosis Date   Diabetes mellitus without complication (HCC)    High cholesterol    Hypertension     History reviewed. No pertinent family history.  Past Surgical History:  Procedure Laterality Date   AMPUTATION TOE Right 12/04/2020   Procedure: AMPUTATION RIGHT SECOND TOE, I&D FOOT;  Surgeon: Eldred Manges, MD;  Location: [facility];  Service: Orthopedics;  Laterality: Right;  Right second toe amputation, I&D right foot.   TONSILLECTOMY     TYMPANOSTOMY TUBE PLACEMENT     Social History   Occupational History   Not on file  Tobacco Use   Smoking status: Every Day    Pack years: 0.00    Types: Cigarettes   Smokeless tobacco: Never  Vaping Use   Vaping Use: Never used  Substance and Sexual Activity   Alcohol use: Never   Drug use: Never   Sexual activity: Not on file

## 2020-12-15 ENCOUNTER — Telehealth: Payer: Self-pay

## 2020-12-15 ENCOUNTER — Ambulatory Visit: Payer: BLUE CROSS/BLUE SHIELD

## 2020-12-15 DIAGNOSIS — E11628 Type 2 diabetes mellitus with other skin complications: Secondary | ICD-10-CM

## 2020-12-15 LAB — CULTURE, BLOOD (ROUTINE X 2)
Culture: NO GROWTH
Culture: NO GROWTH
Special Requests: ADEQUATE
Special Requests: ADEQUATE

## 2020-12-15 NOTE — Telephone Encounter (Signed)
Per pt she would like for Dr. Lajoyce Corners to speak with her sister Alessandra Grout- guest [phone]. 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 Lajoyce Corners would speak with her about the procedure.

## 2020-12-15 NOTE — Progress Notes (Signed)
Patient in office today for wound vac change. This was preformed while NP answered questions about procedure. Pt would like for Dr. Lajoyce Corners to call her sister who was on the line during her visit today and explain why the transmet amputation scheduled for Wednesday is needed and what other options are available. There are ischemic changes to the dorsum of her foot and the lateral side of her foot. The 3rd toe has an ulcer with fibrinous tissue.  There is maceration around the distal end of the wound. Advised will have Dr. Lajoyce Corners call after surgery today and discuss with pt's sister Alessandra Grout- Guest as requested.  Contact number [phone]  Suann Larry, RMA, [facility]

## 2020-12-17 ENCOUNTER — Ambulatory Visit: Payer: Self-pay

## 2020-12-17 ENCOUNTER — Other Ambulatory Visit: Payer: Self-pay | Admitting: Physician Assistant

## 2020-12-17 ENCOUNTER — Other Ambulatory Visit: Payer: Self-pay

## 2020-12-17 ENCOUNTER — Encounter (HOSPITAL_COMMUNITY): Payer: Self-pay | Admitting: Orthopedic Surgery

## 2020-12-17 DIAGNOSIS — L089 Local infection of the skin and subcutaneous tissue, unspecified: Secondary | ICD-10-CM

## 2020-12-17 DIAGNOSIS — E11628 Type 2 diabetes mellitus with other skin complications: Secondary | ICD-10-CM

## 2020-12-17 NOTE — Progress Notes (Signed)
Patient in office for wound vac change today. She is s/p a right second toe amputation I&D with Dr. Ophelia Charter 12/04/20. This will be the last wound vac change before her transmet amputation scheduled for 12/22/20 with Dr. Lajoyce Corners  There is an ulcer on the third toe and ischemic changes to the dorsum of the foot and one small area on the lateral side. There is maceration at the distal end of the wound. The pt is full weight bearing with a post operative shoe. The wound vac was removed and reapplied without incident in the office today.   Zaylei Mullane, RMA, [employer]

## 2020-12-17 NOTE — Progress Notes (Signed)
Spoke with pt for pre-op call. Pt is a type 1 diabetic. Last A1C was 13.1 on 12/02/20. Pt states she is on an insulin pump that is connected to a continuous glucose monitor. I instructed pt to call her endocrinologist or PCP that manages her diabetes and let them know that she is having surgery and ask if she needs to do anything differently with her pump the night before surgery. She voiced understanding. She states her fasting blood sugar has been between 177 and 208. Instructed pt to take 80% of her Toujeo insulin Tuesday evening (she will take 38 units). Instructed pt to check her blood sugar when she gets up the morning of surgery and every 2 hours until she leaves for the hospital. If blood sugar is 70 or below, treat with 1/2 cup of clear juice (apple or cranberry) and recheck blood sugar 15 minutes after drinking juice. Pt again voiced understanding.   Pt's surgery is scheduled as ambulatory so no Covid test is required prior to surgery. Pt denies any recent Covid symptoms.  Beryl Meager, RN, Diabetes Coordinator notified of pt's upcoming surgery.

## 2020-12-22 ENCOUNTER — Ambulatory Visit (HOSPITAL_COMMUNITY): Payer: Managed Care, Other (non HMO) | Admitting: Physician Assistant

## 2020-12-22 ENCOUNTER — Other Ambulatory Visit: Payer: Self-pay

## 2020-12-22 ENCOUNTER — Encounter (HOSPITAL_COMMUNITY): Payer: Self-pay | Admitting: Orthopedic Surgery

## 2020-12-22 ENCOUNTER — Encounter (HOSPITAL_COMMUNITY)
Admission: AD | Disposition: A | Discharge status: Home / Self Care | Payer: Self-pay | Source: Home / Self Care | Attending: Orthopedic Surgery

## 2020-12-22 ENCOUNTER — Observation Stay (HOSPITAL_COMMUNITY)
Admission: AD | Admit: 2020-12-22 | Discharge: 2020-12-23 | Disposition: A | Discharge status: Home / Self Care | Payer: Managed Care, Other (non HMO) | Attending: Orthopedic Surgery | Admitting: Orthopedic Surgery

## 2020-12-22 DIAGNOSIS — F17210 Nicotine dependence, cigarettes, uncomplicated: Secondary | ICD-10-CM | POA: Insufficient documentation

## 2020-12-22 DIAGNOSIS — I10 Essential (primary) hypertension: Secondary | ICD-10-CM | POA: Insufficient documentation

## 2020-12-22 DIAGNOSIS — Z91040 Latex allergy status: Secondary | ICD-10-CM | POA: Insufficient documentation

## 2020-12-22 DIAGNOSIS — M86171 Other acute osteomyelitis, right ankle and foot: Principal | ICD-10-CM

## 2020-12-22 DIAGNOSIS — M86071 Acute hematogenous osteomyelitis, right ankle and foot: Secondary | ICD-10-CM | POA: Diagnosis present

## 2020-12-22 DIAGNOSIS — J45909 Unspecified asthma, uncomplicated: Secondary | ICD-10-CM | POA: Diagnosis not present

## 2020-12-22 DIAGNOSIS — E109 Type 1 diabetes mellitus without complications: Secondary | ICD-10-CM | POA: Diagnosis not present

## 2020-12-22 DIAGNOSIS — Y828 Other medical devices associated with adverse incidents: Secondary | ICD-10-CM | POA: Insufficient documentation

## 2020-12-22 DIAGNOSIS — T8781 Dehiscence of amputation stump: Secondary | ICD-10-CM

## 2020-12-22 HISTORY — DX: Anemia, unspecified: D64.9

## 2020-12-22 HISTORY — DX: Unspecified asthma, uncomplicated: J45.909

## 2020-12-22 HISTORY — DX: Depression, unspecified: F32.A

## 2020-12-22 HISTORY — PX: AMPUTATION: SHX166

## 2020-12-22 HISTORY — DX: Headache, unspecified: R51.9

## 2020-12-22 LAB — GLUCOSE, CAPILLARY
Glucose-Capillary: 337 mg/dL — ABNORMAL HIGH (ref 70–99)
Glucose-Capillary: 303 mg/dL — ABNORMAL HIGH (ref 70–99)
Glucose-Capillary: 279 mg/dL — ABNORMAL HIGH (ref 70–99)
Glucose-Capillary: 273 mg/dL — ABNORMAL HIGH (ref 70–99)
Glucose-Capillary: 194 mg/dL — ABNORMAL HIGH (ref 70–99)

## 2020-12-22 LAB — POCT PREGNANCY, URINE: Preg Test, Ur: NEGATIVE

## 2020-12-22 SURGERY — AMPUTATION, FOOT, RAY
Anesthesia: General | Site: Foot | Laterality: Right

## 2020-12-22 MED ORDER — FENTANYL CITRATE (PF) 100 MCG/2ML IJ SOLN
25.0000 ug | INTRAMUSCULAR | Status: DC | PRN
  Administered 2020-12-22 (×2): 50 ug via INTRAVENOUS

## 2020-12-22 MED ORDER — INSULIN ASPART 100 UNIT/ML IJ SOLN
INTRAMUSCULAR | Status: AC
  Administered 2020-12-22: 8 [IU] via SUBCUTANEOUS
  Filled 2020-12-22: qty 1

## 2020-12-22 MED ORDER — MIDAZOLAM HCL 2 MG/2ML IJ SOLN
INTRAMUSCULAR | Status: AC
  Filled 2020-12-22: qty 2

## 2020-12-22 MED ORDER — PANTOPRAZOLE SODIUM 40 MG PO TBEC
40.0000 mg | DELAYED_RELEASE_TABLET | Freq: Every day | ORAL | Status: DC
  Administered 2020-12-22 – 2020-12-23 (×2): 40 mg via ORAL
  Filled 2020-12-22 (×2): qty 1

## 2020-12-22 MED ORDER — OXYCODONE HCL 5 MG PO TABS
ORAL_TABLET | ORAL | Status: AC
  Filled 2020-12-22: qty 2

## 2020-12-22 MED ORDER — BISACODYL 5 MG PO TBEC: 5.0000 mg | DELAYED_RELEASE_TABLET | Freq: Every day | ORAL | Status: DC | PRN

## 2020-12-22 MED ORDER — POTASSIUM CHLORIDE CRYS ER 20 MEQ PO TBCR: 20.0000 meq | EXTENDED_RELEASE_TABLET | Freq: Every day | ORAL | Status: DC | PRN

## 2020-12-22 MED ORDER — ONDANSETRON HCL 4 MG/2ML IJ SOLN
INTRAMUSCULAR | Status: AC
  Filled 2020-12-22: qty 2

## 2020-12-22 MED ORDER — LIDOCAINE 2% (20 MG/ML) 5 ML SYRINGE
INTRAMUSCULAR | Status: AC
  Filled 2020-12-22: qty 5

## 2020-12-22 MED ORDER — ORAL CARE MOUTH RINSE: 15.0000 mL | Freq: Once | OROMUCOSAL | Status: AC

## 2020-12-22 MED ORDER — INSULIN GLARGINE 100 UNIT/ML ~~LOC~~ SOLN
32.0000 [IU] | Freq: Every day | SUBCUTANEOUS | Status: DC
  Administered 2020-12-22: 32 [IU] via SUBCUTANEOUS
  Filled 2020-12-22 (×3): qty 0.32

## 2020-12-22 MED ORDER — PHENOL 1.4 % MT LIQD: 1.0000 | OROMUCOSAL | Status: DC | PRN

## 2020-12-22 MED ORDER — ALUM & MAG HYDROXIDE-SIMETH 200-200-20 MG/5ML PO SUSP: 15.0000 mL | ORAL | Status: DC | PRN

## 2020-12-22 MED ORDER — ACETAMINOPHEN 500 MG PO TABS
1000.0000 mg | ORAL_TABLET | Freq: Once | ORAL | Status: AC
  Administered 2020-12-22: 1000 mg via ORAL

## 2020-12-22 MED ORDER — MIDAZOLAM HCL 5 MG/5ML IJ SOLN
INTRAMUSCULAR | Status: DC | PRN
  Administered 2020-12-22: 2 mg via INTRAVENOUS

## 2020-12-22 MED ORDER — PHENYLEPHRINE 40 MCG/ML (10ML) SYRINGE FOR IV PUSH (FOR BLOOD PRESSURE SUPPORT)
PREFILLED_SYRINGE | INTRAVENOUS | Status: DC | PRN
  Administered 2020-12-22: 200 ug via INTRAVENOUS

## 2020-12-22 MED ORDER — LACTATED RINGERS IV SOLN: INTRAVENOUS | Status: DC

## 2020-12-22 MED ORDER — SODIUM CHLORIDE 0.9 % IV SOLN: INTRAVENOUS | Status: DC

## 2020-12-22 MED ORDER — INSULIN ASPART 100 UNIT/ML IJ SOLN
0.0000 [IU] | Freq: Three times a day (TID) | INTRAMUSCULAR | Status: DC
Start: 2020-12-22 — End: 2020-12-23
  Administered 2020-12-22: 8 [IU] via SUBCUTANEOUS
  Administered 2020-12-23: 5 [IU] via SUBCUTANEOUS
  Administered 2020-12-23: 8 [IU] via SUBCUTANEOUS
  Administered 2020-12-23: 3 [IU] via SUBCUTANEOUS

## 2020-12-22 MED ORDER — CEFAZOLIN SODIUM-DEXTROSE 2-4 GM/100ML-% IV SOLN
2.0000 g | INTRAVENOUS | Status: AC
  Administered 2020-12-22: 2 g via INTRAVENOUS
  Filled 2020-12-22: qty 100

## 2020-12-22 MED ORDER — DOCUSATE SODIUM 100 MG PO CAPS
100.0000 mg | ORAL_CAPSULE | Freq: Every day | ORAL | Status: DC
  Administered 2020-12-23: 100 mg via ORAL
  Filled 2020-12-22: qty 1

## 2020-12-22 MED ORDER — ONDANSETRON HCL 4 MG/2ML IJ SOLN: 4.0000 mg | Freq: Four times a day (QID) | INTRAMUSCULAR | Status: DC | PRN

## 2020-12-22 MED ORDER — LISINOPRIL 20 MG PO TABS
40.0000 mg | ORAL_TABLET | Freq: Every day | ORAL | Status: DC
  Administered 2020-12-23: 40 mg via ORAL
  Filled 2020-12-22: qty 2

## 2020-12-22 MED ORDER — ACETAMINOPHEN 325 MG PO TABS: 325.0000 mg | ORAL_TABLET | Freq: Four times a day (QID) | ORAL | Status: DC | PRN

## 2020-12-22 MED ORDER — ASCORBIC ACID 500 MG PO TABS
1000.0000 mg | ORAL_TABLET | Freq: Every day | ORAL | Status: DC
  Administered 2020-12-22 – 2020-12-23 (×2): 1000 mg via ORAL
  Filled 2020-12-22 (×2): qty 2

## 2020-12-22 MED ORDER — ATORVASTATIN CALCIUM 80 MG PO TABS
80.0000 mg | ORAL_TABLET | Freq: Every day | ORAL | Status: DC
  Administered 2020-12-22: 80 mg via ORAL
  Filled 2020-12-22: qty 1

## 2020-12-22 MED ORDER — VITAMIN D 25 MCG (1000 UNIT) PO TABS
1000.0000 [IU] | ORAL_TABLET | Freq: Every day | ORAL | Status: DC
  Administered 2020-12-22 – 2020-12-23 (×2): 1000 [IU] via ORAL
  Filled 2020-12-22 (×2): qty 1

## 2020-12-22 MED ORDER — IBUPROFEN 200 MG PO TABS: 600.0000 mg | ORAL_TABLET | Freq: Four times a day (QID) | ORAL | Status: DC | PRN

## 2020-12-22 MED ORDER — INSULIN ASPART 100 UNIT/ML IJ SOLN
10.0000 [IU] | Freq: Once | INTRAMUSCULAR | Status: AC
  Administered 2020-12-22: 10 [IU] via SUBCUTANEOUS

## 2020-12-22 MED ORDER — ONDANSETRON HCL 4 MG/2ML IJ SOLN
INTRAMUSCULAR | Status: DC | PRN
  Administered 2020-12-22: 4 mg via INTRAVENOUS

## 2020-12-22 MED ORDER — INSULIN ASPART 100 UNIT/ML IJ SOLN: 8.0000 [IU] | Freq: Once | INTRAMUSCULAR | Status: AC

## 2020-12-22 MED ORDER — EPHEDRINE SULFATE-NACL 50-0.9 MG/10ML-% IV SOSY
PREFILLED_SYRINGE | INTRAVENOUS | Status: DC | PRN
  Administered 2020-12-22 (×2): 10 mg via INTRAVENOUS

## 2020-12-22 MED ORDER — CHLORHEXIDINE GLUCONATE 0.12 % MT SOLN
15.0000 mL | Freq: Once | OROMUCOSAL | Status: AC
  Administered 2020-12-22: 15 mL via OROMUCOSAL
  Filled 2020-12-22: qty 15

## 2020-12-22 MED ORDER — FENTANYL CITRATE (PF) 100 MCG/2ML IJ SOLN
INTRAMUSCULAR | Status: DC | PRN
  Administered 2020-12-22: 50 ug via INTRAVENOUS

## 2020-12-22 MED ORDER — PHENYLEPHRINE 40 MCG/ML (10ML) SYRINGE FOR IV PUSH (FOR BLOOD PRESSURE SUPPORT)
PREFILLED_SYRINGE | INTRAVENOUS | Status: DC | PRN
  Administered 2020-12-22: 80 ug via INTRAVENOUS
  Administered 2020-12-22: 120 ug via INTRAVENOUS

## 2020-12-22 MED ORDER — INSULIN GLARGINE (1 UNIT DIAL) 300 UNIT/ML ~~LOC~~ SOPN: 32.0000 [IU] | PEN_INJECTOR | Freq: Every day | SUBCUTANEOUS | Status: DC

## 2020-12-22 MED ORDER — PROPOFOL 10 MG/ML IV BOLUS
INTRAVENOUS | Status: AC
  Filled 2020-12-22: qty 40

## 2020-12-22 MED ORDER — IBUPROFEN 200 MG PO TABS
600.0000 mg | ORAL_TABLET | Freq: Four times a day (QID) | ORAL | Status: DC | PRN
  Administered 2020-12-22 – 2020-12-23 (×3): 600 mg via ORAL
  Filled 2020-12-22 (×3): qty 3

## 2020-12-22 MED ORDER — ASPIRIN EC 81 MG PO TBEC
81.0000 mg | DELAYED_RELEASE_TABLET | Freq: Every day | ORAL | Status: DC
  Administered 2020-12-23: 81 mg via ORAL
  Filled 2020-12-22: qty 1

## 2020-12-22 MED ORDER — MAGNESIUM CITRATE PO SOLN: 1.0000 | Freq: Once | ORAL | Status: DC | PRN

## 2020-12-22 MED ORDER — JUVEN PO PACK
1.0000 | PACK | Freq: Two times a day (BID) | ORAL | Status: DC
  Administered 2020-12-22 – 2020-12-23 (×3): 1 via ORAL
  Filled 2020-12-22 (×3): qty 1

## 2020-12-22 MED ORDER — OXYCODONE HCL 5 MG PO TABS
5.0000 mg | ORAL_TABLET | ORAL | Status: DC | PRN
  Administered 2020-12-22: 10 mg via ORAL

## 2020-12-22 MED ORDER — FENTANYL CITRATE (PF) 100 MCG/2ML IJ SOLN
INTRAMUSCULAR | Status: AC
  Filled 2020-12-22: qty 2

## 2020-12-22 MED ORDER — PROPOFOL 10 MG/ML IV BOLUS
INTRAVENOUS | Status: DC | PRN
  Administered 2020-12-22: 150 mg via INTRAVENOUS

## 2020-12-22 MED ORDER — ACETAMINOPHEN 500 MG PO TABS
ORAL_TABLET | ORAL | Status: AC
  Filled 2020-12-22: qty 2

## 2020-12-22 MED ORDER — POLYETHYLENE GLYCOL 3350 17 G PO PACK: 17.0000 g | PACK | Freq: Every day | ORAL | Status: DC | PRN

## 2020-12-22 MED ORDER — CEFAZOLIN SODIUM-DEXTROSE 2-4 GM/100ML-% IV SOLN
2.0000 g | Freq: Three times a day (TID) | INTRAVENOUS | Status: AC
  Administered 2020-12-22 – 2020-12-23 (×2): 2 g via INTRAVENOUS
  Filled 2020-12-22 (×2): qty 100

## 2020-12-22 MED ORDER — ZINC SULFATE 220 (50 ZN) MG PO CAPS
220.0000 mg | ORAL_CAPSULE | Freq: Every day | ORAL | Status: DC
  Administered 2020-12-22 – 2020-12-23 (×2): 220 mg via ORAL
  Filled 2020-12-22 (×2): qty 1

## 2020-12-22 MED ORDER — 0.9 % SODIUM CHLORIDE (POUR BTL) OPTIME
TOPICAL | Status: DC | PRN
  Administered 2020-12-22: 1000 mL

## 2020-12-22 MED ORDER — PHENYLEPHRINE 40 MCG/ML (10ML) SYRINGE FOR IV PUSH (FOR BLOOD PRESSURE SUPPORT)
PREFILLED_SYRINGE | INTRAVENOUS | Status: AC
  Filled 2020-12-22: qty 10

## 2020-12-22 MED ORDER — AMLODIPINE BESYLATE 10 MG PO TABS
10.0000 mg | ORAL_TABLET | Freq: Every day | ORAL | Status: DC
  Administered 2020-12-23: 10 mg via ORAL
  Filled 2020-12-22: qty 1

## 2020-12-22 MED ORDER — FERROUS SULFATE 325 (65 FE) MG PO TABS
325.0000 mg | ORAL_TABLET | Freq: Every day | ORAL | Status: DC
  Administered 2020-12-23: 325 mg via ORAL
  Filled 2020-12-22: qty 1

## 2020-12-22 MED ORDER — GUAIFENESIN-DM 100-10 MG/5ML PO SYRP: 15.0000 mL | ORAL_SOLUTION | ORAL | Status: DC | PRN

## 2020-12-22 MED ORDER — EPHEDRINE 5 MG/ML INJ
INTRAVENOUS | Status: AC
  Filled 2020-12-22: qty 10

## 2020-12-22 MED ORDER — HYDROMORPHONE HCL 1 MG/ML IJ SOLN
0.5000 mg | INTRAMUSCULAR | Status: DC | PRN
  Administered 2020-12-22 – 2020-12-23 (×6): 0.5 mg via INTRAVENOUS
  Filled 2020-12-22 (×6): qty 0.5

## 2020-12-22 MED ORDER — ONDANSETRON HCL 4 MG PO TABS: 4.0000 mg | ORAL_TABLET | Freq: Four times a day (QID) | ORAL | Status: DC | PRN

## 2020-12-22 MED ORDER — LIDOCAINE 2% (20 MG/ML) 5 ML SYRINGE
INTRAMUSCULAR | Status: DC | PRN
  Administered 2020-12-22: 60 mg via INTRAVENOUS

## 2020-12-22 MED ORDER — FENTANYL CITRATE (PF) 250 MCG/5ML IJ SOLN
INTRAMUSCULAR | Status: AC
  Filled 2020-12-22: qty 5

## 2020-12-22 SURGICAL SUPPLY — 29 items
BAG COUNTER SPONGE SURGICOUNT (BAG) ×2 IMPLANT
BLADE SAW SGTL MED 73X18.5 STR (BLADE) IMPLANT
BLADE SURG 21 STRL SS (BLADE) ×2 IMPLANT
BNDG COHESIVE 4X5 TAN STRL (GAUZE/BANDAGES/DRESSINGS) ×2 IMPLANT
BNDG GAUZE ELAST 4 BULKY (GAUZE/BANDAGES/DRESSINGS) ×2 IMPLANT
COVER SURGICAL LIGHT HANDLE (MISCELLANEOUS) ×4 IMPLANT
DRAPE U-SHAPE 47X51 STRL (DRAPES) ×4 IMPLANT
DRESSING PREVENA PLUS CUSTOM (GAUZE/BANDAGES/DRESSINGS) ×1 IMPLANT
DRSG ADAPTIC 3X8 NADH LF (GAUZE/BANDAGES/DRESSINGS) ×2 IMPLANT
DRSG PAD ABDOMINAL 8X10 ST (GAUZE/BANDAGES/DRESSINGS) ×4 IMPLANT
DRSG PREVENA PLUS CUSTOM (GAUZE/BANDAGES/DRESSINGS) ×2
DURAPREP 26ML APPLICATOR (WOUND CARE) ×2 IMPLANT
ELECT REM PT RETURN 9FT ADLT (ELECTROSURGICAL) ×2
ELECTRODE REM PT RTRN 9FT ADLT (ELECTROSURGICAL) ×1 IMPLANT
GAUZE SPONGE 4X4 12PLY STRL (GAUZE/BANDAGES/DRESSINGS) ×2 IMPLANT
GLOVE SURG ORTHO LTX SZ9 (GLOVE) ×2 IMPLANT
GLOVE SURG UNDER POLY LF SZ9 (GLOVE) ×2 IMPLANT
GOWN STRL REUS W/ TWL XL LVL3 (GOWN DISPOSABLE) ×2 IMPLANT
GOWN STRL REUS W/TWL XL LVL3 (GOWN DISPOSABLE) ×2
KIT BASIN OR (CUSTOM PROCEDURE TRAY) ×2 IMPLANT
KIT TURNOVER KIT B (KITS) ×2 IMPLANT
NS IRRIG 1000ML POUR BTL (IV SOLUTION) ×2 IMPLANT
PACK ORTHO EXTREMITY (CUSTOM PROCEDURE TRAY) ×2 IMPLANT
PAD ARMBOARD 7.5X6 YLW CONV (MISCELLANEOUS) ×4 IMPLANT
STOCKINETTE IMPERVIOUS LG (DRAPES) IMPLANT
SUT ETHILON 2 0 PSLX (SUTURE) ×2 IMPLANT
TOWEL GREEN STERILE (TOWEL DISPOSABLE) ×2 IMPLANT
TUBE CONNECTING 12X1/4 (SUCTIONS) ×2 IMPLANT
YANKAUER SUCT BULB TIP NO VENT (SUCTIONS) ×2 IMPLANT

## 2020-12-22 NOTE — Transfer of Care (Signed)
Immediate Anesthesia Transfer of Care Note  Patient: Valerie Le  Procedure(s) Performed: RIGHT TRANSMETATARSAL AMPUTATION (Right: Foot)  Patient Location: PACU  Anesthesia Type:General  Level of Consciousness: awake  Airway & Oxygen Therapy: Patient Spontanous Breathing and Patient connected to face mask oxygen  Post-op Assessment: Report given to RN and Post -op Vital signs reviewed and stable  Post vital signs: Reviewed and stable  Last Vitals:  Vitals Value Taken Time  BP 109/69 12/22/20 0901  Temp    Pulse 88 12/22/20 0903  Resp 16 12/22/20 0903  SpO2 99 % 12/22/20 0903  Vitals shown include unvalidated device data.  Last Pain:  Vitals:   12/22/20 0709  TempSrc:   PainSc: 0-No pain         Complications: No notable events documented.

## 2020-12-22 NOTE — Op Note (Signed)
12/22/2020  9:04 AM  PATIENT:  Valerie Le    PRE-OPERATIVE DIAGNOSIS:  Osteomyelitis Right Foot  POST-OPERATIVE DIAGNOSIS:  Same  PROCEDURE:  RIGHT TRANSMETATARSAL AMPUTATION Application of Prevena 13 cm wound VAC  SURGEON:  [name], MD  PHYSICIAN ASSISTANT:None ANESTHESIA:   General  PREOPERATIVE INDICATIONS:  Valerie Le is a  50 y.o. female with a diagnosis of Osteomyelitis Right Foot who failed conservative measures and elected for surgical management.    The risks benefits and alternatives were discussed with the patient preoperatively including but not limited to the risks of infection, bleeding, nerve injury, cardiopulmonary complications, the need for revision surgery, among others, and the patient was willing to proceed.  OPERATIVE IMPLANTS: Praveena +13 cm wound VAC  @ENCIMAGES @  OPERATIVE FINDINGS: Good petechial bleeding from the posterior flap.  Minimal bleeding from the dorsal soft tissue envelope  OPERATIVE PROCEDURE: Patient was brought the operative room and underwent a general anesthetic.  After adequate levels anesthesia were obtained patient's right lower extremity was prepped using DuraPrep draped into a sterile field a timeout was called.  A incision was made dorsally just proximal to the ischemic ulcer.  A large posterior flap was then created.  A transmetatarsal amputation was performed with a oscillating saw.  There was good vascularity in the plantar flap minimal vascularity on the dorsal flap.  Electrocardio was used hemostasis the wound was irrigated with normal saline.  The incision was closed using 2-0 nylon.  A 13 cm Prevena wound VAC was applied covered with derma tack this had a good suction fit this was covered with Covan patient was extubated taken the PACU in stable condition   DISCHARGE PLANNING:  Antibiotic duration: 23 hours  Weightbearing: Ideally nonweightbearing on the right  Pain medication: Opioid  pathway  Dressing care/ Wound VAC: Continue wound VAC for 1 week  Ambulatory devices: Walker  Discharge to: Anticipate discharge to home after observation.  Follow-up: In the office 1 week post operative.

## 2020-12-22 NOTE — Anesthesia Procedure Notes (Signed)
Procedure Name: LMA Insertion Date/Time: 12/22/2020 8:30 AM Performed by: Caren Macadam, CRNA Pre-anesthesia Checklist: Patient identified, Emergency Drugs available, Suction available and Patient being monitored Patient Re-evaluated:Patient Re-evaluated prior to induction Oxygen Delivery Method: Circle system utilized Preoxygenation: Pre-oxygenation with 100% oxygen Induction Type: IV induction Ventilation: Mask ventilation without difficulty LMA: LMA inserted LMA Size: 4.0 Number of attempts: 1 Placement Confirmation: positive ETCO2 and breath sounds checked- equal and bilateral Tube secured with: Tape Dental Injury: Teeth and Oropharynx as per pre-operative assessment

## 2020-12-22 NOTE — Anesthesia Postprocedure Evaluation (Signed)
Anesthesia Post Note  Patient: Valerie Le  Procedure(s) Performed: RIGHT TRANSMETATARSAL AMPUTATION (Right: Foot)     Patient location during evaluation: PACU Anesthesia Type: General Level of consciousness: awake and alert Pain management: pain level controlled Vital Signs Assessment: post-procedure vital signs reviewed and stable Respiratory status: spontaneous breathing, nonlabored ventilation, respiratory function stable and patient connected to nasal cannula oxygen Cardiovascular status: blood pressure returned to baseline and stable Postop Assessment: no apparent nausea or vomiting Anesthetic complications: no   No notable events documented.  Last Vitals:  Vitals:   12/22/20 0945 12/22/20 1000  BP: 104/69 103/66  Pulse: 83 81  Resp: 12 14  Temp:  36.4 C  SpO2: 93% 94%    Last Pain:  Vitals:   12/22/20 1000  TempSrc:   PainSc: Asleep        RLE Motor Response: Purposeful movement;Responds to commands (12/22/20 1000) RLE Sensation: Full sensation (12/22/20 1000)      Esker Dever L Maryclaire Stoecker

## 2020-12-22 NOTE — Progress Notes (Signed)
CBG this AM in short stay was 337. Patient verbalized that she had this morning 9 units Humalog (via Insulin pump) and 38 units Toujeo. Per MD verbal order (Dr. Aleene Davidson) patient received Novolog insulin - 10 units [place]. Will continue to monitor.

## 2020-12-22 NOTE — H&P (Signed)
Valerie Le is an 50 y.o. female.   Chief Complaint: Right Foot Osteomyelitis HPI: Patient is a 50 year old woman who is seen for initial evaluation for ulceration right foot.  She is status post a right foot second ray amputation she has been undergoing wound VAC changes and presents with progressive ischemic changes to the distal aspect of the incision with exposed bone.  Past Medical History:  Diagnosis Date   Anemia    Asthma    as a child   Depression    Diabetes mellitus without complication (HCC)    Type 1   Headache    migraines   High cholesterol    Hypertension     Past Surgical History:  Procedure Laterality Date   AMPUTATION TOE Right 12/04/2020   Procedure: AMPUTATION RIGHT SECOND TOE, I&D FOOT;  Surgeon: Eldred Manges, MD;  Location: [facility];  Service: Orthopedics;  Laterality: Right;  Right second toe amputation, I&D right foot.   birth control implant     TONSILLECTOMY     TYMPANOSTOMY TUBE PLACEMENT      History reviewed. No pertinent family history. Social History:  reports that she has been smoking cigarettes. She has been smoking an average of 0.25 packs per day. She has never used smokeless tobacco. She reports that she does not drink alcohol and does not use drugs.  Allergies:  Allergies  Allergen Reactions   Coconut Oil Anaphylaxis   Pineapple Anaphylaxis   Tomato Anaphylaxis   Latex Hives   Metformin And Related Other (See Comments)    Constipation/cramps    No medications prior to admission.    No results found for this or any previous visit (from the past 48 hour(s)). No results found.  Review of Systems  All other systems reviewed and are negative.  There were no vitals taken for this visit. Physical Exam    Patient is alert, oriented, no adenopathy, well-dressed, normal affect, normal respiratory effort. Examination patient has a palpable dorsalis pedis pulse she does have ischemic areas dorsally over the foot with ischemic  skin areas over the lateral and mid aspect of her foot these do not appear to be secondary to the wound VAC dressing do not appear to be secondary to the wound VAC drainage tube.  A Doppler was used and patient has a biphasic dorsalis pedis and posterior tibial pulse.  The wound is necrotic distal half and the proximal aspect does have some granulation tissue but minimal.  The second metatarsal is white ischemic and exposed.  Most recent hemoglobin A1c was 13.1.Heart RRR Lungs Clear Assessment/Plan 1. Diabetic foot infection (HCC)  2. Subacute osteomyelitis of right foot (HCC)       Plan: With the ischemic changes and exposed bone patient will require further surgical intervention.  Discussed that we could proceed with a transmetatarsal amputation for foot salvage intervention.  Discussed that with her microcirculatory disease that she is at increased risk of the incision not healing and potential for a transtibial amputation.  Patient states she understands she would like to proceed with surgery in early July.  We will set this up at her convenience as outpatient surgery discussed the importance of strict nonweightbearing after surgery.  Discussed the importance of starting a protein supplement at this time with 30 g of protein as a morning and afternoon snack.  [place] Elinor Kleine, PA 12/22/2020, 6:31 AM

## 2020-12-22 NOTE — TOC Initial Note (Signed)
Transition of Care [facility]) - Initial/Assessment Note    Patient Details  Name: Valerie Le MRN: [id number] Date of Birth: [birth year]  Transition of [facility]) CM/SW Contact:    Lawerance Sabal, RN Phone Number: 12/22/2020, 4:06 PM  Clinical Narrative:         Patient presented for RIGHT TRANSMETATARSAL AMPUTATION today. Proveena VAC applied post op.  He is from home w wife.   No TOC needs identified at this time.           Expected Discharge Plan: Home/Self Care Barriers to Discharge: Continued Medical Work up   Patient Goals and CMS Choice        Expected Discharge Plan and Services Expected Discharge Plan: Home/Self Care   Discharge Planning Services: CM Consult                                          Prior Living Arrangements/Services                       Activities of Daily Living Home Assistive Devices/Equipment: Cane (specify quad or straight), Eyeglasses ADL Screening (condition at time of admission) Patient's cognitive ability adequate to safely complete daily activities?: Yes Is the patient deaf or have difficulty hearing?: No Does the patient have difficulty seeing, even when wearing glasses/contacts?: No Does the patient have difficulty concentrating, remembering, or making decisions?: No Patient able to express need for assistance with ADLs?: Yes Does the patient have difficulty dressing or bathing?: No Independently performs ADLs?: Yes (appropriate for developmental age) Does the patient have difficulty walking or climbing stairs?: No Weakness of Legs: None Weakness of Arms/Hands: None  Permission Sought/Granted                  Emotional Assessment              Admission diagnosis:  Acute hematogenous osteomyelitis of right foot (HCC) [M86.071] Patient Active Problem List   Diagnosis Date Noted   Acute hematogenous osteomyelitis of right foot (HCC) 12/22/2020   Osteomyelitis of foot, right, acute (HCC)     Dehiscence of amputation stump (HCC)    Diabetic foot infection (HCC) 12/02/2020   Essential hypertension 12/02/2020   Hyperlipidemia associated with type 2 diabetes mellitus (HCC) 08/21/2018   Bipolar affective disorder, currently depressed, moderate (HCC) 01/23/2017   Morbid obesity due to excess calories (HCC) 02/15/2016   Insulin-treated type 2 diabetes mellitus (HCC) 03/07/2013   Asthma 03/07/2013   ADHD (attention deficit hyperactivity disorder) 03/07/2013   Migraine 03/07/2013   Tobacco abuse 03/07/2013   PCP:  Maud Deed, PA Pharmacy:   [id number] [place], [place] - [address] [place] [address] Phone: [phone] Fax: [phone]  [facility] [id number] - [place], [place] - [address] [place] [address] Phone: [phone] Fax: [phone]     Social Determinants of Health (SDOH) Interventions    Readmission Risk Interventions Readmission Risk Prevention Plan 12/06/2020  Post Dischage Appt Complete  Medication Screening Complete  Transportation Screening Complete

## 2020-12-22 NOTE — Interval H&P Note (Signed)
History and Physical Interval Note:  12/22/2020 7:05 AM  Valerie Le  has presented today for surgery, with the diagnosis of Osteomyelitis Right Foot.  The various methods of treatment have been discussed with the patient and family. After consideration of risks, benefits and other options for treatment, the patient has consented to  Procedure(s): RIGHT TRANSMETATARSAL AMPUTATION (Right) as a surgical intervention.  The patient's history has been reviewed, patient examined, no change in status, stable for surgery.  I have reviewed the patient's chart and labs.  Questions were answered to the patient's satisfaction.     [name]

## 2020-12-22 NOTE — Anesthesia Preprocedure Evaluation (Addendum)
Anesthesia Evaluation  Patient identified by MRN, date of birth, ID band Patient awake    Reviewed: Allergy & Precautions, NPO status , Patient's Chart, lab work & pertinent test results  Airway Mallampati: I  TM Distance: >3 FB Neck ROM: Full    Dental no notable dental hx. (+) Teeth Intact, Dental Advisory Given   Pulmonary asthma , Current Smoker and Patient abstained from smoking.,    Pulmonary exam normal breath sounds clear to auscultation       Cardiovascular hypertension, Pt. on medications Normal cardiovascular exam Rhythm:Regular Rate:Normal     Neuro/Psych  Headaches, PSYCHIATRIC DISORDERS Depression Bipolar Disorder    GI/Hepatic negative GI ROS, Neg liver ROS,   Endo/Other  diabetes (BG 377, received 10U insulin in preop, usually has insulin pump now removed), Poorly Controlled, Type 1, Insulin Dependent  Renal/GU negative Renal ROS  negative genitourinary   Musculoskeletal negative musculoskeletal ROS (+)   Abdominal   Peds  Hematology  (+) Blood dyscrasia (Hgb 10.4), anemia ,   Anesthesia Other Findings   Reproductive/Obstetrics                            Anesthesia Physical Anesthesia Plan  ASA: 3  Anesthesia Plan: General   Post-op Pain Management:    Induction: Intravenous  PONV Risk Score and Plan: 2 and Ondansetron and Midazolam  Airway Management Planned: LMA  Additional Equipment:   Intra-op Plan:   Post-operative Plan: Extubation in OR  Informed Consent: I have reviewed the patients History and Physical, chart, labs and discussed the procedure including the risks, benefits and alternatives for the proposed anesthesia with the patient or authorized representative who has indicated his/her understanding and acceptance.     Dental advisory given  Plan Discussed with: CRNA  Anesthesia Plan Comments:         Anesthesia Quick Evaluation

## 2020-12-22 NOTE — Progress Notes (Signed)
CBG decreased to 303. Per Dr. Raford Pitcher verbal order patient received additional 8 units Novolog Wann.

## 2020-12-22 NOTE — Progress Notes (Signed)
Orthopedic Tech Progress Note Patient Details:  Valerie Le July 09, 1970 [id number] Spoke with patient's nurse and patient already has a post op shoe Patient ID: Valerie Le, female   DOB: 01/19/1971, 50 y.o.   MRN: [id number]  [name] 12/22/2020, 2:43 PM

## 2020-12-23 ENCOUNTER — Encounter (HOSPITAL_COMMUNITY): Payer: Self-pay | Admitting: Orthopedic Surgery

## 2020-12-23 DIAGNOSIS — M86171 Other acute osteomyelitis, right ankle and foot: Secondary | ICD-10-CM | POA: Diagnosis not present

## 2020-12-23 LAB — GLUCOSE, CAPILLARY
Glucose-Capillary: 199 mg/dL — ABNORMAL HIGH (ref 70–99)
Glucose-Capillary: 217 mg/dL — ABNORMAL HIGH (ref 70–99)
Glucose-Capillary: 256 mg/dL — ABNORMAL HIGH (ref 70–99)

## 2020-12-23 MED ORDER — OXYCODONE-ACETAMINOPHEN 5-325 MG PO TABS: 1.0000 | ORAL_TABLET | ORAL | 0 refills | Status: DC | PRN

## 2020-12-23 NOTE — Progress Notes (Signed)
Printed AVS forms and reviewed with patient. Verbalized understanding at this time. Awaiting DME equipment at this time for d/c. Secure chat sent to [facility] CM to discuss DME needs.

## 2020-12-23 NOTE — TOC Progression Note (Addendum)
Transition of Care [facility]) - Progression Note    Patient Details  Name: KARYME MCCONATHY MRN: [id number] Date of Birth: 01/29/1971  Transition of Care [facility]) CM/SW Contact  Lockie Pares, RN Phone Number: 12/23/2020, 12:28 PM  Clinical Narrative:     Spoke to patient and PT in conference. Initially recommended Dme at home, but now recommendations are CIR. . Will message CIR for consult. CIR consulted and stated patient wished to go home. DME ordered, walker, wheelchair and 3:1  1600 Pt and OT hh recommended, dr Lajoyce Corners called to place face to face, called [facility], declined,  called [facility] to see if they could take patient.., declined 1700 called [facility], cannot take, called [facility], they could take, ut will not be able to call in until tomorrow for verification. Will keep patient updated at home.   Expected Discharge Plan: Home/Self Care Barriers to Discharge: Continued Medical Work up  Expected Discharge Plan and Services Expected Discharge Plan: Home/Self Care   Discharge Planning Services: CM Consult     Expected Discharge Date: 12/23/20                                     Social Determinants of Health (SDOH) Interventions    Readmission Risk Interventions Readmission Risk Prevention Plan 12/06/2020  Post Dischage Appt Complete  Medication Screening Complete  Transportation Screening Complete

## 2020-12-23 NOTE — Plan of Care (Signed)
  Problem: Activity: Goal: Risk for activity intolerance will decrease Outcome: Progressing   Problem: Pain Managment: Goal: General experience of comfort will improve Outcome: Progressing   Problem: Safety: Goal: Ability to remain free from injury will improve Outcome: Progressing   Problem: Skin Integrity: Goal: Risk for impaired skin integrity will decrease Outcome: Progressing   Problem: Education: Goal: Knowledge of General Education information will improve Description Including pain rating scale, medication(s)/side effects and non-pharmacologic comfort measures Outcome: Completed/Met   

## 2020-12-23 NOTE — Progress Notes (Signed)
Tele monitor placed on patient per orders.

## 2020-12-23 NOTE — Progress Notes (Signed)
Inpatient Rehabilitation Admissions Coordinator   Patient has notified me that she now prefers d/c home with [facility]. I have notified [place], [place], acute team and TOC. We will again sign off.  Ottie Glazier, RN, MSN Rehab Admissions Coordinator [phone] 12/23/2020 12:55 PM

## 2020-12-23 NOTE — Discharge Summary (Signed)
Discharge Diagnoses:  Active Problems:   Osteomyelitis of foot, right, acute (HCC)   Dehiscence of amputation stump (HCC)   Acute hematogenous osteomyelitis of right foot (HCC)   Surgeries: Procedure(s): RIGHT TRANSMETATARSAL AMPUTATION on 12/22/2020    Consultants:   Discharged Condition: Improved  Hospital Course: Valerie Le is an 50 y.o. female who was admitted 12/22/2020 with a chief complaint of right foot osteomyelitis, with a final diagnosis of Osteomyelitis Right Foot.  Patient was brought to the operating room on 12/22/2020 and underwent Procedure(s): RIGHT TRANSMETATARSAL AMPUTATION.    Patient was given perioperative antibiotics:  Anti-infectives (From admission, onward)    Start     Dose/Rate Route Frequency Ordered Stop   12/22/20 1600  ceFAZolin (ANCEF) IVPB 2g/100 mL premix        2 g 200 mL/hr over 30 Minutes Intravenous Every 8 hours 12/22/20 1312 12/23/20 0051   12/22/20 0645  ceFAZolin (ANCEF) IVPB 2g/100 mL premix        2 g 200 mL/hr over 30 Minutes Intravenous On call to O.R. 12/22/20 7026 12/22/20 0841     .  Patient was given sequential compression devices, early ambulation, and aspirin for DVT prophylaxis.  Recent vital signs: Patient Vitals for the past 24 hrs:  BP Temp Temp src Pulse Resp SpO2  12/23/20 0739 (!) 143/80 98 F (36.7 C) Oral 83 17 94 %  12/23/20 0503 (!) 144/85 97.8 F (36.6 C) Oral 82 18 96 %  12/23/20 0023 133/87 98.9 F (37.2 C) Oral 82 18 98 %  12/22/20 1700 139/83 98 F (36.7 C) Oral 84 18 98 %  12/22/20 1317 116/76 97.8 F (36.6 C) Oral 81 16 96 %  12/22/20 1300 115/75 -- -- 81 15 96 %  12/22/20 1230 128/82 -- -- 80 15 97 %  12/22/20 1200 118/81 -- -- 80 16 97 %  12/22/20 1130 130/80 -- -- 80 17 97 %  .  Recent laboratory studies: No results found.  Discharge Medications:   Allergies as of 12/23/2020       Reactions   Coconut Oil Anaphylaxis   Pineapple Anaphylaxis   Tomato Anaphylaxis   Latex Hives    Metformin And Related Other (See Comments)   Constipation/cramps        Medication List     STOP taking these medications    amoxicillin 500 MG capsule Commonly known as: AMOXIL       TAKE these medications    amLODipine 10 MG tablet Commonly known as: NORVASC Take 10 mg by mouth daily.   aspirin EC 81 MG tablet Take 81 mg by mouth at bedtime. Swallow whole.   atorvastatin 80 MG tablet Commonly known as: LIPITOR Take 80 mg by mouth at bedtime.   cholecalciferol 25 MCG (1000 UNIT) tablet Commonly known as: VITAMIN D3 Take 1,000 Units by mouth daily.   ferrous sulfate 325 (65 FE) MG tablet Take 1 tablet (325 mg total) by mouth daily with breakfast.   ibuprofen 200 MG tablet Commonly known as: ADVIL Take 600 mg by mouth every 6 (six) hours as needed for mild pain.   insulin lispro 100 UNIT/ML injection Commonly known as: HUMALOG Inject into the skin 3 (three) times daily before meals. Gives between 12-42 units   insulin pump Soln Inject into the skin. Novolog 100 units/ml   lisinopril 40 MG tablet Commonly known as: ZESTRIL Take 40 mg by mouth daily.   metoCLOPramide 10 MG tablet Commonly known as: REGLAN Take  10 mg by mouth every 6 (six) hours as needed for nausea.   ondansetron 4 MG tablet Commonly known as: ZOFRAN Take 1 tablet (4 mg total) by mouth every 6 (six) hours as needed for nausea.   oxyCODONE-acetaminophen 5-325 MG tablet Commonly known as: Percocet Take 1 tablet by mouth every 4 (four) hours as needed for severe pain.   SUMAtriptan 25 MG tablet Commonly known as: IMITREX Take 25 mg by mouth every 2 (two) hours as needed for migraine. May repeat in 2 hours if headache persists or recurs.   TOUJEO SOLOSTAR Excello Inject 48 Units into the skin at bedtime.        Diagnostic Studies: MR FOOT RIGHT WO CONTRAST  Result Date: 12/03/2020 CLINICAL DATA:  Acute foot pain.  Infection suspected EXAM: MRI OF THE RIGHT FOREFOOT WITHOUT CONTRAST  TECHNIQUE: Multiplanar, multisequence MR imaging of the right forefoot was performed. No intravenous contrast was administered. COMPARISON:  X-ray 12/02/2020 FINDINGS: Bones/Joint/Cartilage Bone marrow edema throughout the proximal phalanx of the right second toe with intermediate T1 marrow signal compatible with acute osteomyelitis (series 6, images 11-13). Bone marrow edema within the middle phalanx of the second toe with preservation of the fatty T1 bone marrow signal. Preserved signal within the second metatarsal head. Remaining osseous structures are within normal limits. No fracture or dislocation. Small joint effusion at the first MTP joint, nonspecific. Ligaments Intact Lisfranc ligament. Collateral ligaments of the forefoot appear intact. Muscles and Tendons Diffuse intramuscular edema suggesting myositis. Soft tissues Fluid and air containing collection predominantly located within the dorsal soft tissues of the forefoot extending to the base of the second toe measuring approximately 7.7 x 1.7 x 2.9 cm (series 5, image 11; series 4, image 19). Fluid collection extends into the first intermetatarsal space (series 4, image 20). Diffuse subcutaneous edema throughout the foot. Small collections of fluid noted within the cutaneous aspect of the dorsal forefoot suggest sites of skin blistering. IMPRESSION: 1. Acute osteomyelitis of the proximal phalanx of the right second toe. 2. Bone marrow edema within the middle phalanx of the second toe may represent reactive osteitis versus early acute osteomyelitis. 3. Fluid and air containing collection predominantly located within the dorsal soft tissues of the forefoot extending to the base of the second toe measuring up to 7.7 x 1.7 x 2.9 cm compatible with abscess. Fluid collection extends into the first intermetatarsal space. 4. Small first MTP joint effusion which may be reactive or represent septic arthritis. 5. Diffuse intramuscular edema suggesting myositis.  Electronically Signed   By: Duanne Guess D.O.   On: 12/03/2020 20:57   DG Foot Complete Right  Result Date: 12/02/2020 CLINICAL DATA:  Foot ulcer.  Hyperglycemia EXAM: RIGHT FOOT COMPLETE - 3+ VIEW COMPARISON:  None. FINDINGS: No evidence of acute fracture or dislocation in the right foot. No focal bone lesion or cortical changes to suggest osteomyelitis. 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 Mild dorsal soft tissue swelling with soft tissue gas demonstrated over the dorsum of the forefoot. This suggests cellulitis with gas-forming organism. No radiopaque soft tissue foreign bodies. IMPRESSION: No acute fracture or dislocation. Soft tissue swelling with soft tissue gas consistent with cellulitis. No radiographic changes of osteomyelitis. Electronically Signed   By: Burman Nieves M.D.   On: 12/02/2020 18:02    Patient benefited maximally from their hospital stay and there were no complications.     Disposition: Discharge disposition: 01-Home or Self Care  Discharge Instructions     Call MD / Call 911   Complete by: As directed    If you experience chest pain or shortness of breath, CALL 911 and be transported to the hospital emergency room.  If you develope a fever above 101 F, pus (white drainage) or increased drainage or redness at the wound, or calf pain, call your surgeon's office.   Constipation Prevention   Complete by: As directed    Drink plenty of fluids.  Prune juice may be helpful.  You may use a stool softener, such as Colace (over the counter) 100 mg twice a day.  Use MiraLax (over the counter) for constipation as needed.   Diet - low sodium heart healthy   Complete by: As directed    Discharge instructions   Complete by: As directed    Elevate right foot . Nonweightbearing. Call office if vac alarms   Increase activity slowly as tolerated   Complete by: As directed    Negative Pressure Wound Therapy -  Incisional   Complete by: As directed    Show patient how to attach vac. Should call office if alarms   Post-operative opioid taper instructions:   Complete by: As directed    POST-OPERATIVE OPIOID TAPER INSTRUCTIONS: It is important to wean off of your opioid medication as soon as possible. If you do not need pain medication after your surgery it is ok to stop day one. Opioids include: Codeine, Hydrocodone(Norco, Vicodin), Oxycodone(Percocet, oxycontin) and hydromorphone amongst others.  Long term and even short term use of opiods can cause: Increased pain response Dependence Constipation Depression Respiratory depression And more.  Withdrawal symptoms can include Flu like symptoms Nausea, vomiting And more Techniques to manage these symptoms Hydrate well Eat regular healthy meals Stay active Use relaxation techniques(deep breathing, meditating, yoga) Do Not substitute Alcohol to help with tapering If you have been on opioids for less than two weeks and do not have pain than it is ok to stop all together.  Plan to wean off of opioids This plan should start within one week post op of your joint replacement. Maintain the same interval or time between taking each dose and first decrease the dose.  Cut the total daily intake of opioids by one tablet each day Next start to increase the time between doses. The last dose that should be eliminated is the evening dose.          Follow-up Information     Xin [place], [place], [place] Follow up in 1 week(s).   Specialty: Orthopedic Surgery Contact information: [address] [place] [address] [phone]                  Signed: [place] Carlton Buskey 12/23/2020, 11:06 AM

## 2020-12-23 NOTE — Progress Notes (Signed)
Inpatient Diabetes Program Recommendations  AACE/ADA: New Consensus Statement on Inpatient Glycemic Control (2015)  Target Ranges:  Prepandial:   less than 140 mg/dL      Peak postprandial:   less than 180 mg/dL (1-2 hours)      Critically ill patients:  140 - 180 mg/dL   Results for Valerie Le, Valerie Le (MRN [id number]) as of 12/23/2020 08:11  Ref. Range 12/22/2020 07:00 12/22/2020 08:00 12/22/2020 09:05 12/22/2020 16:56 12/22/2020 21:15  Glucose-Capillary Latest Ref Range: 70 - 99 mg/dL 337 (H)  10 units NOVOLOG @7 :30 303 (H)  8 units NOVOLOG @8 :09 279 (H) 273 (H)  8 units NOVOLOG@17 :06 194 (H)  32 units LANTUS @22 :24   Results for Valerie Le, Valerie Le (MRN [id number]) as of 12/23/2020 08:11  Ref. Range 12/23/2020 06:27  Glucose-Capillary Latest Ref Range: 70 - 99 mg/dL 199 (H)  3 units NOVOLOG @7 :63    Admit with: R Transmetatarsal Amputation  History: Type 1 Diabetes  Home DM Meds: Toujeo 48 units QHS (when off pump)       Humalog 12-42 units TID (when off pump)       Insulin Pump with Humalog  Current Orders: Lantus 32 units QHS      Novolog Moderate Correction Scale/ SSI (0-15 units) TID AC    Endocrinologist: Dr. Hartford Poli with [facility]    Met with pt this AM (~9am).  Pt A&O and stated she would be going home today.  Took her insulin pump off and sent home with her family yesterday.  Received Lantus last PM.  Pt told me she restarted her home insulin pump around June 26th using the same settings as she was using previously.  Has appt with her ENDO on 07/08.  Plans to resume her home insulin pump when she goes home today.  Discussed with pt that she received 32 units Lantus last PM around 10:30pm and will have that on board in her system until 10 pm tonight.  Suggested to pt that she hold off on resuming the basal rates on her insulin pump until around 6-7pm tonight to avoid overlap of the Lantus and basal rates on her pump.  Pt stated she knows how to suspend her basal rates and  will likely give herself injection of Humalog with dinner tonight and resume her home insulin shortly after around 7pm.  Did not have any other diabetes realted questions for me at this time.    --Will follow patient during hospitalization--  Wyn Quaker RN, MSN, CDE Diabetes Coordinator Inpatient Glycemic Control Team Team Pager: [phone] (8a-5p)

## 2020-12-23 NOTE — Evaluation (Signed)
Physical Therapy Evaluation Patient Details Name: Valerie Le MRN: [id number] DOB: 1971-05-25 Today's Date: 12/23/2020   History of Present Illness  Patient is a 50 year old woman admitted 7/6 with diagnosis of Osteomyelitis Right Foot who has now had R trans met amputation. pt is status post a right foot second ray amputation she has been undergoing wound VAC changes and presents with progressive ischemic changes to the distal aspect of the incision with exposed bone. PMH : asthma, DM, depression, HTN  Clinical Impression  Pt fully participated in evaluation. Pt was I with use of cane PTA. Pt is now requiring mod A for transfers and min-mod A during gait now that pt is NWB. Pt demonstrating weakness, balance and coordination defitis. Pt will benefit from skilled PT to address deficits to maximize independence with functional mobility prior to discharge.    Follow Up Recommendations CIR;Supervision/Assistance - 24 hour    Equipment Recommendations  Wheelchair (measurements PT);Wheelchair cushion (measurements PT);3in1 (PT)    Recommendations for Other Services       Precautions / Restrictions Precautions Precautions: Fall Precaution Comments: wound VAC Required Braces or Orthoses: Other Brace Splint/Cast: post op shoe Other Brace: post op shoe Restrictions Weight Bearing Restrictions: Yes RLE Weight Bearing: Non weight bearing Other Position/Activity Restrictions: post op shoe      Mobility  Bed Mobility Overal bed mobility: Modified Independent                  Transfers Overall transfer level: Needs assistance Equipment used: Rolling walker (2 wheeled) Transfers: Sit to/from Stand Sit to Stand: Mod assist         General transfer comment: pt requiring increased assist to maintain NWB, pt with difficulty pressing up using RW while maintaining NWB requiring mod A from therapist  Ambulation/Gait Ambulation/Gait assistance: Min assist;Mod assist Gait  Distance (Feet): 4 Feet Assistive device: Rolling walker (2 wheeled)       General Gait Details: min A while hoping forward and mod A to maintain balance when hoping back to chair  Stairs            Wheelchair Mobility    Modified Rankin (Stroke Patients Only)       Balance Overall balance assessment: Needs assistance Sitting-balance support: No upper extremity supported;Feet supported Sitting balance-Leahy Scale: Good     Standing balance support: Bilateral upper extremity supported Standing balance-Leahy Scale: Poor Standing balance comment: requiring B UE support on RW and min A from therapist                             Pertinent Vitals/Pain Pain Assessment: 0-10 Pain Score: 5  Pain Location: R foot    Home Living Family/patient expects to be discharged to:: Private residence Living Arrangements: Parent Available Help at Discharge: Family Type of Home: Apartment Home Access: Stairs to enter Entrance Stairs-Rails: Right Entrance Stairs-Number of Steps: 1 flight 14 stairs Home Layout: One level Home Equipment: Cane - single point;Shower seat;Walker - standard Additional Comments: will have assistance of teenage children if needed    Prior Function Level of Independence: Independent               Hand Dominance   Dominant Hand: Right    Extremity/Trunk Assessment   Upper Extremity Assessment Upper Extremity Assessment: Defer to OT evaluation    Lower Extremity Assessment Lower Extremity Assessment: Generalized weakness RLE Deficits / Details: requiring increased assist for sit<>stand  Cervical / Trunk Assessment Cervical / Trunk Assessment: Normal  Communication   Communication: No difficulties  Cognition Arousal/Alertness: Awake/alert Behavior During Therapy: WFL for tasks assessed/performed Overall Cognitive Status: Within Functional Limits for tasks assessed                                         General Comments General comments (skin integrity, edema, etc.): VSS on RA. increased time spent educating pt on concerns of d/c home with requiring mod A due to new NWB on R LE. Pt in agreement and had difficulty with hopping short distances    Exercises     Assessment/Plan    PT Assessment Patient needs continued PT services  PT Problem List Decreased strength;Decreased mobility;Decreased activity tolerance;Decreased balance;Decreased knowledge of use of DME;Pain;Decreased range of motion;Decreased coordination       PT Treatment Interventions DME instruction;Gait training;Therapeutic exercise;Balance training;Therapeutic activities;Functional mobility training;Patient/family education;Stair training    PT Goals (Current goals can be found in the Care Plan section)  Acute Rehab PT Goals Patient Stated Goal: I need to get stronger PT Goal Formulation: With patient Time For Goal Achievement: 01/06/21 Potential to Achieve Goals: Good    Frequency Min 3X/week   Barriers to discharge        Co-evaluation               AM-PAC PT "6 Clicks" Mobility  Outcome Measure Help needed turning from your back to your side while in a flat bed without using bedrails?: None Help needed moving from lying on your back to sitting on the side of a flat bed without using bedrails?: None Help needed moving to and from a bed to a chair (including a wheelchair)?: A Little Help needed standing up from a chair using your arms (e.g., wheelchair or bedside chair)?: A Lot Help needed to walk in hospital room?: A Lot Help needed climbing 3-5 steps with a railing? : A Lot 6 Click Score: 17    End of Session Equipment Utilized During Treatment: Gait belt Activity Tolerance: Patient tolerated treatment well Patient left: in chair;with call bell/phone within reach;with chair alarm set Nurse Communication: Mobility status PT Visit Diagnosis: Unsteadiness on feet (R26.81);Other abnormalities of gait  and mobility (R26.89);Muscle weakness (generalized) (M62.81);Pain Pain - Right/Left: Right Pain - part of body: Ankle and joints of foot    Time: 1200-1224 PT Time Calculation (min) (ACUTE ONLY): 24 min   Charges:     PT Treatments $Therapeutic Activity: 8-22 mins        Lyanne Co, DPT Acute Rehabilitation Services [phone]   [place] 12/23/2020, 1:02 PM

## 2020-12-23 NOTE — Progress Notes (Signed)
Patient ID: Valerie Le, female   DOB: 01-09-1971, 50 y.o.   MRN: [id number] Patient is postoperative day 1 transmetatarsal amputation.  There is a scant amount of blood in the wound VAC canister with a good suction fit.  Patient expresses concerns regarding discharge to home safely.  Will evaluate if there are rehabilitation options.

## 2020-12-23 NOTE — Telephone Encounter (Signed)
Can you please see this message below about patient. Dr. Lajoyce Corners wanted me to pass this along to the pt's case manager. Please let me know if there is anything that I can do.

## 2020-12-23 NOTE — Progress Notes (Addendum)
Inpatient Rehabilitation Admissions Coordinator   I met with patient at bedside. She is preparing to d/c home today and would like to speak to [facility] team to make those arrangements. I have notified TOC. We will sign off.  Danne Baxter, RN, MSN Rehab Admissions Coordinator [phone] 12/23/2020 11:59 AM  Notified by PT that patient on eval is felt Mod assist and they are recommending CIR admit. I will notify Bevely Palmer Persons, PA. I spoke with patient at bedside. She has 14 step entry into second floor home. She will discuss with her family and let me know if house would like to pursue CIR admit with [employer] vs home with [facility]. I await therapy assessment to be documented in EPIC.  Danne Baxter, RN, MSN Rehab Admissions Coordinator [phone] 12/23/2020 12:29 PM

## 2020-12-23 NOTE — Evaluation (Signed)
Occupational Therapy Evaluation Patient Details Name: Valerie Le MRN: [id number] DOB: 1970/10/25 Today's Date: 12/23/2020    History of Present Illness Patient is a 50 year old woman admitted 7/6 with diagnosis of Osteomyelitis Right Foot who has now had R trans met amputation. pt is status post a right foot second ray amputation she has been undergoing wound VAC changes and presents with progressive ischemic changes to the distal aspect of the incision with exposed bone. PMH : asthma, DM, depression, HTN   Clinical Impression   Pt presents with decline in function and safety with ADLs and ADL mobility with impaired balance and endurance. PTA pt lived at home with her husband and teenage children and was Ind with ADLs/selfcare, IADLs, home mgt, was driving. Pt currently requires mod A with LB ADLs, mod - min A with mobility/transfers using RW, min A with toileting tasks. Pt refusing post acute rehab and w/c stating that she does not have room in her house to maneuver with a w/c and that she has her teenage children 24/7 to assist her as needed. Pt would benefit from acute OT services to address impairments to maximize level of function and safety    Follow Up Recommendations  Home health OT (pt refusing post acute rehab/CIR)    Equipment Recommendations  Tub/shower seat;3 in 1 bedside commode;Wheelchair (measurements OT);Other (comment) (RW, reacher)    Recommendations for Other Services       Precautions / Restrictions Precautions Precautions: Fall Precaution Comments: wound VAC Required Braces or Orthoses: Other Brace Splint/Cast: R post op shoe Other Brace: R post op shoe Restrictions Weight Bearing Restrictions: Yes RLE Weight Bearing: Non weight bearing Other Position/Activity Restrictions: post op shoe      Mobility Bed Mobility Overal bed mobility: Modified Independent             General bed mobility comments: pt in recliner upon arrival     Transfers Overall transfer level: Needs assistance Equipment used: Rolling walker (2 wheeled) Transfers: Sit to/from Stand Sit to Stand: Mod assist;Min assist         General transfer comment: mod A from recliner, cues or hand placement. Min A to and from [facility]    Balance Overall balance assessment: Needs assistance Sitting-balance support: No upper extremity supported;Feet supported Sitting balance-Leahy Scale: Good     Standing balance support: Bilateral upper extremity supported;During functional activity Standing balance-Leahy Scale: Poor Standing balance comment: requiring B UE support on RW and min A from therapist                           ADL either performed or assessed with clinical judgement   ADL Overall ADL's : Needs assistance/impaired Eating/Feeding: Independent;Sitting   Grooming: Wash/dry hands;Wash/dry face;Standing;Minimal assistance;Min guard   Upper Body Bathing: Set up;Sitting   Lower Body Bathing: Moderate assistance;Sitting/lateral leans   Upper Body Dressing : Set up;Sitting   Lower Body Dressing: Moderate assistance;Sitting/lateral leans   Toilet Transfer: Minimal assistance;Ambulation;RW;BSC;Cueing for safety   Toileting- Clothing Manipulation and Hygiene: Minimal assistance;Sit to/from stand       Functional mobility during ADLs: Minimal assistance;Rolling walker;Cueing for safety       Vision Baseline Vision/History: Wears glasses Wears Glasses: Reading only Patient Visual Report: No change from baseline       Perception     Praxis      Pertinent Vitals/Pain Pain Assessment: 0-10 Pain Score: 3  Pain Location: R foot Pain Descriptors / Indicators: Discomfort  Pain Intervention(s): Monitored during session;Repositioned     Hand Dominance Right   Extremity/Trunk Assessment Upper Extremity Assessment Upper Extremity Assessment: Overall WFL for tasks assessed   Lower Extremity Assessment Lower Extremity  Assessment: Defer to PT evaluation RLE Deficits / Details: requiring increased assist for sit<>stand   Cervical / Trunk Assessment Cervical / Trunk Assessment: Normal   Communication Communication Communication: No difficulties   Cognition Arousal/Alertness: Awake/alert Behavior During Therapy: WFL for tasks assessed/performed Overall Cognitive Status: Within Functional Limits for tasks assessed                                     General Comments  VSS on RA. increased time spent educating pt on concerns of d/c home with requiring mod A due to new NWB on R LE. Pt in agreement and had difficulty with hopping short distances    Exercises     Shoulder Instructions      Home Living Family/patient expects to be discharged to:: Private residence Living Arrangements: Parent Available Help at Discharge: Family Type of Home: Apartment Home Access: Stairs to enter Technical brewer of Steps: 1 flight 14 stairs Entrance Stairs-Rails: Right Home Layout: One level     Bathroom Shower/Tub: Teacher, early years/pre: Standard     Home Equipment: Cane - single point;Shower seat;Walker - standard   Additional Comments: will have assistance of teenage children if needed      Prior Functioning/Environment Level of Independence: Independent                 OT Problem List: Impaired balance (sitting and/or standing);Pain;Decreased coordination;Decreased activity tolerance;Decreased knowledge of use of DME or AE      OT Treatment/Interventions: Self-care/ADL training;Patient/family education;Therapeutic activities;DME and/or AE instruction;Balance training    OT Goals(Current goals can be found in the care plan section) Acute Rehab OT Goals Patient Stated Goal: I need to get stronger OT Goal Formulation: With patient Time For Goal Achievement: 01/06/21 Potential to Achieve Goals: Good ADL Goals Pt Will Perform Grooming: with min guard assist;with  supervision;with set-up;standing;with caregiver independent in assisting Pt Will Perform Lower Body Bathing: with min assist;with min guard assist;sitting/lateral leans;with caregiver independent in assisting Pt Will Perform Lower Body Dressing: with min assist;with min guard assist;sitting/lateral leans;with caregiver independent in assisting Pt Will Transfer to Toilet: with min assist;with min guard assist;ambulating;regular height toilet;bedside commode Pt Will Perform Toileting - Clothing Manipulation and hygiene: with min assist;with min guard assist;sit to/from stand  OT Frequency: Min 2X/week   Barriers to D/C:            Co-evaluation              AM-PAC OT "6 Clicks" Daily Activity     Outcome Measure Help from another person eating meals?: None Help from another person taking care of personal grooming?: A Little Help from another person toileting, which includes using toliet, bedpan, or urinal?: A Little Help from another person bathing (including washing, rinsing, drying)?: A Lot Help from another person to put on and taking off regular upper body clothing?: None Help from another person to put on and taking off regular lower body clothing?: A Lot 6 Click Score: 18   End of Session Equipment Utilized During Treatment: Gait belt;Rolling walker;Other (comment) (BSC)  Activity Tolerance: Patient tolerated treatment well Patient left: with call bell/phone within reach;in chair;with chair alarm set  OT Visit Diagnosis:  Other abnormalities of gait and mobility (R26.89);Pain;Unsteadiness on feet (R26.81) Pain - Right/Left: Right Pain - part of body: Ankle and joints of foot                Time: 1431-1459 OT Time Calculation (min): 28 min Charges:  OT General Charges $OT Visit: 1 Visit OT Evaluation $OT Eval Moderate Complexity: 1 Mod OT Treatments $Self Care/Home Management : 8-22 mins    Britt Bottom 12/23/2020, 3:10 PM

## 2020-12-23 NOTE — Discharge Summary (Signed)
Discharge Diagnoses:  Active Problems:   Osteomyelitis of foot, right, acute (HCC)   Dehiscence of amputation stump (HCC)   Acute hematogenous osteomyelitis of right foot (HCC)   Surgeries: Procedure(s): RIGHT TRANSMETATARSAL AMPUTATION on 12/22/2020    Consultants:   Discharged Condition: Improved  Hospital Course: Valerie Le is an 50 y.o. female who was admitted 12/22/2020 with a chief complaint of right foot osteomyelitis, with a final diagnosis of Osteomyelitis Right Foot.  Patient was brought to the operating room on 12/22/2020 and underwent Procedure(s): RIGHT TRANSMETATARSAL AMPUTATION.    Patient was given perioperative antibiotics:  Anti-infectives (From admission, onward)    Start     Dose/Rate Route Frequency Ordered Stop   12/22/20 1600  ceFAZolin (ANCEF) IVPB 2g/100 mL premix        2 g 200 mL/hr over 30 Minutes Intravenous Every 8 hours 12/22/20 1312 12/23/20 0051   12/22/20 0645  ceFAZolin (ANCEF) IVPB 2g/100 mL premix        2 g 200 mL/hr over 30 Minutes Intravenous On call to O.R. 12/22/20 1610 12/22/20 0841     .  Patient was given sequential compression devices, early ambulation, and aspirin for DVT prophylaxis.  Recent vital signs: Patient Vitals for the past 24 hrs:  BP Temp Temp src Pulse Resp SpO2  12/23/20 0739 (!) 143/80 98 F (36.7 C) Oral 83 17 94 %  12/23/20 0503 (!) 144/85 97.8 F (36.6 C) Oral 82 18 96 %  12/23/20 0023 133/87 98.9 F (37.2 C) Oral 82 18 98 %  12/22/20 2004 126/82 97.7 F (36.5 C) Oral 83 18 100 %  .  Recent laboratory studies: No results found.  Discharge Medications:   Allergies as of 12/23/2020       Reactions   Coconut Oil Anaphylaxis   Pineapple Anaphylaxis   Tomato Anaphylaxis   Latex Hives   Metformin And Related Other (See Comments)   Constipation/cramps        Medication List     STOP taking these medications    amoxicillin 500 MG capsule Commonly known as: AMOXIL       TAKE these  medications    amLODipine 10 MG tablet Commonly known as: NORVASC Take 10 mg by mouth daily.   aspirin EC 81 MG tablet Take 81 mg by mouth at bedtime. Swallow whole.   atorvastatin 80 MG tablet Commonly known as: LIPITOR Take 80 mg by mouth at bedtime.   cholecalciferol 25 MCG (1000 UNIT) tablet Commonly known as: VITAMIN D3 Take 1,000 Units by mouth daily.   ferrous sulfate 325 (65 FE) MG tablet Take 1 tablet (325 mg total) by mouth daily with breakfast.   ibuprofen 200 MG tablet Commonly known as: ADVIL Take 600 mg by mouth every 6 (six) hours as needed for mild pain.   insulin lispro 100 UNIT/ML injection Commonly known as: HUMALOG Inject into the skin 3 (three) times daily before meals. Gives between 12-42 units   insulin pump Soln Inject into the skin. Novolog 100 units/ml   lisinopril 40 MG tablet Commonly known as: ZESTRIL Take 40 mg by mouth daily.   metoCLOPramide 10 MG tablet Commonly known as: REGLAN Take 10 mg by mouth every 6 (six) hours as needed for nausea.   ondansetron 4 MG tablet Commonly known as: ZOFRAN Take 1 tablet (4 mg total) by mouth every 6 (six) hours as needed for nausea.   oxyCODONE-acetaminophen 5-325 MG tablet Commonly known as: Percocet Take 1 tablet by mouth  every 4 (four) hours as needed for severe pain.   SUMAtriptan 25 MG tablet Commonly known as: IMITREX Take 25 mg by mouth every 2 (two) hours as needed for migraine. May repeat in 2 hours if headache persists or recurs.   TOUJEO SOLOSTAR Lamar Inject 48 Units into the skin at bedtime.               Durable Medical Equipment  (From admission, onward)           Start     Ordered   12/23/20 1258  For home use only DME 3 n 1  Once        12/23/20 1258   12/23/20 1258  For home use only DME lightweight manual wheelchair with seat cushion  Once       Comments: Patient suffers from weakneess which impairs their ability to perform daily activities like toileting in the  home.  A walker will not resolve  issue with performing activities of daily living. A wheelchair will allow patient to safely perform daily activities. Patient is not able to propel themselves in the home using a standard weight wheelchair due to general weakness. Patient can self propel in the lightweight wheelchair. Length of need 6 months . Accessories: elevating leg rests (ELRs), wheel locks, extensions and anti-tippers.   12/23/20 1258   12/23/20 1257  For home use only DME Walker rolling  Once       Question Answer Comment  Walker: With 5 Inch Wheels   Patient needs a walker to treat with the following condition Weakness      12/23/20 1258            Diagnostic Studies: MR FOOT RIGHT WO CONTRAST  Result Date: 12/03/2020 CLINICAL DATA:  Acute foot pain.  Infection suspected EXAM: MRI OF THE RIGHT FOREFOOT WITHOUT CONTRAST TECHNIQUE: Multiplanar, multisequence MR imaging of the right forefoot was performed. No intravenous contrast was administered. COMPARISON:  X-ray 12/02/2020 FINDINGS: Bones/Joint/Cartilage Bone marrow edema throughout the proximal phalanx of the right second toe with intermediate T1 marrow signal compatible with acute osteomyelitis (series 6, images 11-13). Bone marrow edema within the middle phalanx of the second toe with preservation of the fatty T1 bone marrow signal. Preserved signal within the second metatarsal head. Remaining osseous structures are within normal limits. No fracture or dislocation. Small joint effusion at the first MTP joint, nonspecific. Ligaments Intact Lisfranc ligament. Collateral ligaments of the forefoot appear intact. Muscles and Tendons Diffuse intramuscular edema suggesting myositis. Soft tissues Fluid and air containing collection predominantly located within the dorsal soft tissues of the forefoot extending to the base of the second toe measuring approximately 7.7 x 1.7 x 2.9 cm (series 5, image 11; series 4, image 19). Fluid collection  extends into the first intermetatarsal space (series 4, image 20). Diffuse subcutaneous edema throughout the foot. Small collections of fluid noted within the cutaneous aspect of the dorsal forefoot suggest sites of skin blistering. IMPRESSION: 1. Acute osteomyelitis of the proximal phalanx of the right second toe. 2. Bone marrow edema within the middle phalanx of the second toe may represent reactive osteitis versus early acute osteomyelitis. 3. Fluid and air containing collection predominantly located within the dorsal soft tissues of the forefoot extending to the base of the second toe measuring up to 7.7 x 1.7 x 2.9 cm compatible with abscess. Fluid collection extends into the first intermetatarsal space. 4. Small first MTP joint effusion which may be reactive or represent septic arthritis.  5. Diffuse intramuscular edema suggesting myositis. Electronically Signed   By: Duanne Guess D.O.   On: 12/03/2020 20:57   DG Foot Complete Right  Result Date: 12/02/2020 CLINICAL DATA:  Foot ulcer.  Hyperglycemia EXAM: RIGHT FOOT COMPLETE - 3+ VIEW COMPARISON:  None. FINDINGS: No evidence of acute fracture or dislocation in the right foot. No focal bone lesion or cortical changes to suggest osteomyelitis. 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 Mild dorsal soft tissue swelling with soft tissue gas demonstrated over the dorsum of the forefoot. This suggests cellulitis with gas-forming organism. No radiopaque soft tissue foreign bodies. IMPRESSION: No acute fracture or dislocation. Soft tissue swelling with soft tissue gas consistent with cellulitis. No radiographic changes of osteomyelitis. Electronically Signed   By: Burman Nieves M.D.   On: 12/02/2020 18:02    Patient benefited maximally from their hospital stay and there were no complications.     Disposition: Discharge disposition: 01-Home or Self Care      Discharge Instructions     Call MD /  Call 911   Complete by: As directed    If you experience chest pain or shortness of breath, CALL 911 and be transported to the hospital emergency room.  If you develope a fever above 101 F, pus (white drainage) or increased drainage or redness at the wound, or calf pain, call your surgeon's office.   Constipation Prevention   Complete by: As directed    Drink plenty of fluids.  Prune juice may be helpful.  You may use a stool softener, such as Colace (over the counter) 100 mg twice a day.  Use MiraLax (over the counter) for constipation as needed.   Diet - low sodium heart healthy   Complete by: As directed    Discharge instructions   Complete by: As directed    Elevate right foot . Nonweightbearing. Call office if vac alarms   Increase activity slowly as tolerated   Complete by: As directed    Negative Pressure Wound Therapy - Incisional   Complete by: As directed    Show patient how to attach vac. Should call office if alarms   Post-operative opioid taper instructions:   Complete by: As directed    POST-OPERATIVE OPIOID TAPER INSTRUCTIONS: It is important to wean off of your opioid medication as soon as possible. If you do not need pain medication after your surgery it is ok to stop day one. Opioids include: Codeine, Hydrocodone(Norco, Vicodin), Oxycodone(Percocet, oxycontin) and hydromorphone amongst others.  Long term and even short term use of opiods can cause: Increased pain response Dependence Constipation Depression Respiratory depression And more.  Withdrawal symptoms can include Flu like symptoms Nausea, vomiting And more Techniques to manage these symptoms Hydrate well Eat regular healthy meals Stay active Use relaxation techniques(deep breathing, meditating, yoga) Do Not substitute Alcohol to help with tapering If you have been on opioids for less than two weeks and do not have pain than it is ok to stop all together.  Plan to wean off of opioids This plan should  start within one week post op of your joint replacement. Maintain the same interval or time between taking each dose and first decrease the dose.  Cut the total daily intake of opioids by one tablet each day Next start to increase the time between doses. The last dose that should be eliminated is the evening dose.          Follow-up Information  Mylissa Lambe, [place], [place] Follow up in 1 week(s).   Specialty: Orthopedic Surgery Contact information: [address] [place] [address] [phone]         [facility], [facility] Follow up.   Why: your equipment Contact information: [address] [place] [address] [phone]                  Signed: [place] Anne Garo [place] 12/23/2020, 5:09 PM

## 2020-12-25 ENCOUNTER — Telehealth: Payer: Self-pay

## 2020-12-25 NOTE — Telephone Encounter (Signed)
Called patient to say that this CM could not find Home Health to take her for PT. Tried, [facility], [facility], [facility],  [facility], they either could not take insurance or were not staffed. Ms Kierstead stated she had a appointment on Tuesday and will discuss outpatient PT with the provider. She states she is doing well and this will be no problem.

## 2020-12-27 ENCOUNTER — Telehealth: Payer: Self-pay | Admitting: Orthopedic Surgery

## 2020-12-27 NOTE — Telephone Encounter (Signed)
We will discuss at her post op this week. She really doesn't need PT this soon outpatient

## 2020-12-27 NOTE — Telephone Encounter (Signed)
Pt called requesting a call back. Pt states her insurance denied having inpatient physical therapy and would like the dr to order outpatient pt. Please call pt at [phone].

## 2020-12-27 NOTE — Telephone Encounter (Signed)
Called and spoke with patient. Relayed information from [facility].

## 2020-12-28 ENCOUNTER — Encounter: Payer: Self-pay | Admitting: Physician Assistant

## 2020-12-28 ENCOUNTER — Ambulatory Visit (INDEPENDENT_AMBULATORY_CARE_PROVIDER_SITE_OTHER): Payer: Managed Care, Other (non HMO) | Admitting: Physician Assistant

## 2020-12-28 DIAGNOSIS — E11628 Type 2 diabetes mellitus with other skin complications: Secondary | ICD-10-CM

## 2020-12-28 DIAGNOSIS — L089 Local infection of the skin and subcutaneous tissue, unspecified: Secondary | ICD-10-CM

## 2020-12-28 MED ORDER — GABAPENTIN 300 MG PO CAPS: 300.0000 mg | ORAL_CAPSULE | Freq: Three times a day (TID) | ORAL | 3 refills | Status: DC

## 2020-12-28 NOTE — Progress Notes (Signed)
Office Visit Note   Patient: Valerie Le           Date of Birth: 07/04/1970           MRN: [id number] Visit Date: 12/28/2020              Requested by: Maud Deed, [place] [address] [place],  [place] [address] PCP: Maud Deed, [place]  Chief Complaint  Patient presents with   Right Foot - Routine Post Op      HPI: Patient is 1 week status post transmetatarsal amputation.  Her only complaint is of some neuropathic pain in her foot.  Assessment & Plan: Visit Diagnoses: No diagnosis found.  Plan: We will call in gabapentin for her and explained her how to take this.  Follow-up in 1 week.  Continue nonweightbearing.  She should just do some gentle ankle range of motion so as not to develop an Achilles contracture.  She may begin daily cleansing with Dial soap and water  Follow-Up Instructions: No follow-ups on file.   Ortho Exam  Patient is alert, oriented, no adenopathy, well-dressed, normal affect, normal respiratory effort. Examination well apposed wound edges with some bleeding.  Mild soft tissue swelling.  No cellulitis no foul odor no signs of infection.  Imaging: No results found. No images are attached to the encounter.  Labs: Lab Results  Component Value Date   HGBA1C 13.1 (H) 12/02/2020   REPTSTATUS 12/11/2020 FINAL 12/04/2020   GRAMSTAIN  12/04/2020    ABUNDANT WBC PRESENT,BOTH PMN AND MONONUCLEAR RARE GRAM POSITIVE COCCI Performed at [facility], [address] [place], [place] [address]    CULT  12/04/2020    RARE ACTINOMYCES NEUII Standardized susceptibility testing for this organism is not available. RARE FINEGOLDIA MAGNA      Lab Results  Component Value Date   ALBUMIN 2.6 (L) 12/03/2020   ALBUMIN 2.9 (L) 12/02/2020    No results found for: MG No results found for: VD25OH  No results found for: PREALBUMIN CBC EXTENDED Latest Ref Rng & Units 12/06/2020 12/05/2020 12/04/2020  WBC 4.0 - 10.5 K/uL  11.2(H) 11.1(H) 10.3  RBC 3.87 - 5.11 MIL/uL 4.24 3.80(L) 4.15  HGB 12.0 - 15.0 g/dL 10.4(L) 9.4(L) 10.1(L)  HCT 36.0 - 46.0 % 32.0(L) 29.0(L) 31.6(L)  PLT 150 - 400 K/uL 315 298 274  NEUTROABS 1.7 - 7.7 K/uL - - -  LYMPHSABS 0.7 - 4.0 K/uL - - -     There is no height or weight on file to calculate BMI.  Orders:  No orders of the defined types were placed in this encounter.  No orders of the defined types were placed in this encounter.    Procedures: No procedures performed  Clinical Data: No additional findings.  ROS:  All other systems negative, except as noted in the HPI. Review of Systems  Objective: Vital Signs: LMP 12/02/2020   Specialty Comments:  No specialty comments available.  PMFS History: Patient Active Problem List   Diagnosis Date Noted   Acute hematogenous osteomyelitis of right foot (HCC) 12/22/2020   Osteomyelitis of foot, right, acute (HCC)    Dehiscence of amputation stump (HCC)    Diabetic foot infection (HCC) 12/02/2020   Essential hypertension 12/02/2020   Hyperlipidemia associated with type 2 diabetes mellitus (HCC) 08/21/2018   Bipolar affective disorder, currently depressed, moderate (HCC) 01/23/2017   Morbid obesity due to excess calories (HCC) 02/15/2016   Insulin-treated type 2 diabetes mellitus (HCC)  03/07/2013   Asthma 03/07/2013   ADHD (attention deficit hyperactivity disorder) 03/07/2013   Migraine 03/07/2013   Tobacco abuse 03/07/2013   Past Medical History:  Diagnosis Date   Anemia    Asthma    as a child   Depression    Diabetes mellitus without complication (HCC)    Type 1   Headache    migraines   High cholesterol    Hypertension     No family history on file.  Past Surgical History:  Procedure Laterality Date   AMPUTATION Right 12/22/2020   Procedure: RIGHT TRANSMETATARSAL AMPUTATION;  Surgeon: [name], MD;  Location: [facility] OR;  Service: Orthopedics;  Laterality: Right;   AMPUTATION TOE Right 12/04/2020    Procedure: AMPUTATION RIGHT SECOND TOE, I&D FOOT;  Surgeon: Eldred Manges, MD;  Location: [facility] ORS;  Service: Orthopedics;  Laterality: Right;  Right second toe amputation, I&D right foot.   birth control implant     TONSILLECTOMY     TYMPANOSTOMY TUBE PLACEMENT     Social History   Occupational History   Not on file  Tobacco Use   Smoking status: Every Day    Packs/day: 0.25    Pack years: 0.00    Types: Cigarettes   Smokeless tobacco: Never  Vaping Use   Vaping Use: Never used  Substance and Sexual Activity   Alcohol use: Never   Drug use: Never   Sexual activity: Not on file

## 2020-12-31 ENCOUNTER — Inpatient Hospital Stay: Payer: BLUE CROSS/BLUE SHIELD | Admitting: Infectious Diseases

## 2021-01-04 ENCOUNTER — Other Ambulatory Visit: Payer: Self-pay

## 2021-01-04 ENCOUNTER — Ambulatory Visit (INDEPENDENT_AMBULATORY_CARE_PROVIDER_SITE_OTHER): Payer: Managed Care, Other (non HMO) | Admitting: Orthopedic Surgery

## 2021-01-04 DIAGNOSIS — Z89431 Acquired absence of right foot: Secondary | ICD-10-CM

## 2021-01-05 ENCOUNTER — Encounter: Payer: Self-pay | Admitting: Orthopedic Surgery

## 2021-01-05 NOTE — Progress Notes (Signed)
Office Visit Note   Patient: Valerie Le           Date of Birth: 06-26-1970           MRN: [id number] Visit Date: 01/04/2021              Requested by: Maud Deed, [place] [address] [place],  [place] [address] PCP: Maud Deed, [place]  Chief Complaint  Patient presents with   Right Foot - Routine Post Op    12/22/20 right transmet amputation       HPI: Patient is approximately 10 days status post transmetatarsal amputation right foot.   Assessment & Plan: Visit Diagnoses:  1. History of transmetatarsal amputation of right foot (HCC)     Plan: Sutures harvested at next visit, patient was given instructions for Achilles stretching, she was given a stump shrinker.  Patient was given a prescription for biotech for extra-depth shoe custom orthotic spacer and carbon plate.  Follow-Up Instructions: Return in about 2 weeks (around 01/18/2021).   Ortho Exam  Patient is alert, oriented, no adenopathy, well-dressed, normal affect, normal respiratory effort. Examination the transmetatarsal amputation is healing well there is no cellulitis no odor no drainage no wound dehiscence.  Imaging: No results found. No images are attached to the encounter.  Labs: Lab Results  Component Value Date   HGBA1C 13.1 (H) 12/02/2020   REPTSTATUS 12/11/2020 FINAL 12/04/2020   GRAMSTAIN  12/04/2020    ABUNDANT WBC PRESENT,BOTH PMN AND MONONUCLEAR RARE GRAM POSITIVE COCCI Performed at [facility], [address] [place], [place] [address]    CULT  12/04/2020    RARE ACTINOMYCES NEUII Standardized susceptibility testing for this organism is not available. RARE FINEGOLDIA MAGNA      Lab Results  Component Value Date   ALBUMIN 2.6 (L) 12/03/2020   ALBUMIN 2.9 (L) 12/02/2020    No results found for: MG No results found for: VD25OH  No results found for: PREALBUMIN CBC EXTENDED Latest Ref Rng & Units 12/06/2020 12/05/2020 12/04/2020  WBC 4.0 -  10.5 K/uL 11.2(H) 11.1(H) 10.3  RBC 3.87 - 5.11 MIL/uL 4.24 3.80(L) 4.15  HGB 12.0 - 15.0 g/dL 10.4(L) 9.4(L) 10.1(L)  HCT 36.0 - 46.0 % 32.0(L) 29.0(L) 31.6(L)  PLT 150 - 400 K/uL 315 298 274  NEUTROABS 1.7 - 7.7 K/uL - - -  LYMPHSABS 0.7 - 4.0 K/uL - - -     There is no height or weight on file to calculate BMI.  Orders:  No orders of the defined types were placed in this encounter.  No orders of the defined types were placed in this encounter.    Procedures: No procedures performed  Clinical Data: No additional findings.  ROS:  All other systems negative, except as noted in the HPI. Review of Systems  Objective: Vital Signs: There were no vitals taken for this visit.  Specialty Comments:  No specialty comments available.  PMFS History: Patient Active Problem List   Diagnosis Date Noted   Acute hematogenous osteomyelitis of right foot (HCC) 12/22/2020   Osteomyelitis of foot, right, acute (HCC)    Dehiscence of amputation stump (HCC)    Diabetic foot infection (HCC) 12/02/2020   Essential hypertension 12/02/2020   Hyperlipidemia associated with type 2 diabetes mellitus (HCC) 08/21/2018   Bipolar affective disorder, currently depressed, moderate (HCC) 01/23/2017   Morbid obesity due to excess calories (HCC) 02/15/2016   Insulin-treated type 2 diabetes mellitus (HCC) 03/07/2013   Asthma  03/07/2013   ADHD (attention deficit hyperactivity disorder) 03/07/2013   Migraine 03/07/2013   Tobacco abuse 03/07/2013   Past Medical History:  Diagnosis Date   Anemia    Asthma    as a child   Depression    Diabetes mellitus without complication (HCC)    Type 1   Headache    migraines   High cholesterol    Hypertension     History reviewed. No pertinent family history.  Past Surgical History:  Procedure Laterality Date   AMPUTATION Right 12/22/2020   Procedure: RIGHT TRANSMETATARSAL AMPUTATION;  Surgeon: [name], MD;  Location: [facility] OR;  Service: Orthopedics;   Laterality: Right;   AMPUTATION TOE Right 12/04/2020   Procedure: AMPUTATION RIGHT SECOND TOE, I&D FOOT;  Surgeon: Eldred Manges, MD;  Location: [facility] ORS;  Service: Orthopedics;  Laterality: Right;  Right second toe amputation, I&D right foot.   birth control implant     TONSILLECTOMY     TYMPANOSTOMY TUBE PLACEMENT     Social History   Occupational History   Not on file  Tobacco Use   Smoking status: Every Day    Packs/day: 0.25    Types: Cigarettes   Smokeless tobacco: Never  Vaping Use   Vaping Use: Never used  Substance and Sexual Activity   Alcohol use: Never   Drug use: Never   Sexual activity: Not on file

## 2021-01-12 ENCOUNTER — Telehealth: Payer: Self-pay

## 2021-01-12 NOTE — Telephone Encounter (Signed)
Barnet Pall from [facility] called he is faxing over a order for wound measurements.

## 2021-01-14 NOTE — Telephone Encounter (Signed)
Greg from [facility] called asking for pts wound care measurements

## 2021-01-14 NOTE — Telephone Encounter (Signed)
The pt was not in our office for the dates he is requesting. I faxed over the order and advised the pt had surgery 12/22/20 and first post op 12/28/20 she had vac placed after surgery and removed on the first post op appt. There were no mesaurements for the 12/24/20 and 01/07/21 days that he is requesting.

## 2021-01-18 ENCOUNTER — Encounter: Payer: Self-pay | Admitting: Orthopedic Surgery

## 2021-01-18 ENCOUNTER — Ambulatory Visit (INDEPENDENT_AMBULATORY_CARE_PROVIDER_SITE_OTHER): Payer: Managed Care, Other (non HMO) | Admitting: Physician Assistant

## 2021-01-18 DIAGNOSIS — Z89431 Acquired absence of right foot: Secondary | ICD-10-CM

## 2021-01-18 NOTE — Progress Notes (Signed)
Office Visit Note   Patient: Valerie Le           Date of Birth: 10/05/1970           MRN: [id number] Visit Date: 01/18/2021              Requested by: Maud Deed, [place] [address] [place],  [place] [address] PCP: Maud Deed, [place]  Chief Complaint  Patient presents with   Right Leg - Follow-up      HPI: Patient is almost a month status post right transmetatarsal amputation.  She is doing well and wearing a regular tennis shoe today.  She has ordered a filler which is due to come into 2 or 3 weeks.  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  Plan: I have released her to work.  She understands she is to elevate her leg when she can when she is working at her desk.  If she has any concerns or increased swelling she should contact us so we can change her work restrictions.  She will follow-up for final visit in 1 month  Follow-Up Instructions: No follow-ups on file.   Ortho Exam  Patient is alert, oriented, no adenopathy, well-dressed, normal affect, normal respiratory effort. Examination wound is 95% healed she has 2 very small areas of superficial healing.  There is minimal bloody drainage.  Nothing probes deeply.  No ascending cellulitis.  Swelling is well controlled.  Imaging: No results found. No images are attached to the encounter.  Labs: Lab Results  Component Value Date   HGBA1C 13.1 (H) 12/02/2020   REPTSTATUS 12/11/2020 FINAL 12/04/2020   GRAMSTAIN  12/04/2020    ABUNDANT WBC PRESENT,BOTH PMN AND MONONUCLEAR RARE GRAM POSITIVE COCCI Performed at [facility], [address] [place], [place] [address]    CULT  12/04/2020    RARE ACTINOMYCES NEUII Standardized susceptibility testing for this organism is not available. RARE FINEGOLDIA MAGNA      Lab Results  Component Value Date   ALBUMIN 2.6 (L) 12/03/2020   ALBUMIN 2.9 (L)  12/02/2020    No results found for: MG No results found for: VD25OH  No results found for: PREALBUMIN CBC EXTENDED Latest Ref Rng & Units 12/06/2020 12/05/2020 12/04/2020  WBC 4.0 - 10.5 K/uL 11.2(H) 11.1(H) 10.3  RBC 3.87 - 5.11 MIL/uL 4.24 3.80(L) 4.15  HGB 12.0 - 15.0 g/dL 10.4(L) 9.4(L) 10.1(L)  HCT 36.0 - 46.0 % 32.0(L) 29.0(L) 31.6(L)  PLT 150 - 400 K/uL 315 298 274  NEUTROABS 1.7 - 7.7 K/uL - - -  LYMPHSABS 0.7 - 4.0 K/uL - - -     There is no height or weight on file to calculate BMI.  Orders:  No orders of the defined types were placed in this encounter.  No orders of the defined types were placed in this encounter.    Procedures: No procedures performed  Clinical Data: No additional findings.  ROS:  All other systems negative, except as noted in the HPI. Review of Systems  Objective: Vital Signs: There were no vitals taken for this visit.  Specialty Comments:  No specialty comments available.  PMFS History: Patient Active Problem List   Diagnosis Date Noted   Acute hematogenous osteomyelitis of right foot (HCC) 12/22/2020   Osteomyelitis of foot, right, acute (HCC)    Dehiscence of amputation stump (HCC)    Diabetic foot  infection (HCC) 12/02/2020   Essential hypertension 12/02/2020   Hyperlipidemia associated with type 2 diabetes mellitus (HCC) 08/21/2018   Bipolar affective disorder, currently depressed, moderate (HCC) 01/23/2017   Morbid obesity due to excess calories (HCC) 02/15/2016   Insulin-treated type 2 diabetes mellitus (HCC) 03/07/2013   Asthma 03/07/2013   ADHD (attention deficit hyperactivity disorder) 03/07/2013   Migraine 03/07/2013   Tobacco abuse 03/07/2013   Past Medical History:  Diagnosis Date   Anemia    Asthma    as a child   Depression    Diabetes mellitus without complication (HCC)    Type 1   Headache    migraines   High cholesterol    Hypertension     No family history on file.  Past Surgical History:   Procedure Laterality Date   AMPUTATION Right 12/22/2020   Procedure: RIGHT TRANSMETATARSAL AMPUTATION;  Surgeon: [name], MD;  Location: [facility] OR;  Service: Orthopedics;  Laterality: Right;   AMPUTATION TOE Right 12/04/2020   Procedure: AMPUTATION RIGHT SECOND TOE, I&D FOOT;  Surgeon: Eldred Manges, MD;  Location: [facility] ORS;  Service: Orthopedics;  Laterality: Right;  Right second toe amputation, I&D right foot.   birth control implant     TONSILLECTOMY     TYMPANOSTOMY TUBE PLACEMENT     Social History   Occupational History   Not on file  Tobacco Use   Smoking status: Every Day    Packs/day: 0.25    Types: Cigarettes   Smokeless tobacco: Never  Vaping Use   Vaping Use: Never used  Substance and Sexual Activity   Alcohol use: Never   Drug use: Never   Sexual activity: Not on file

## 2021-02-01 ENCOUNTER — Encounter: Payer: Self-pay | Admitting: Infectious Diseases

## 2021-02-01 ENCOUNTER — Ambulatory Visit (INDEPENDENT_AMBULATORY_CARE_PROVIDER_SITE_OTHER): Payer: Managed Care, Other (non HMO) | Admitting: Infectious Diseases

## 2021-02-01 ENCOUNTER — Other Ambulatory Visit: Payer: Self-pay

## 2021-02-01 VITALS — BP 101/69 | HR 100 | Temp 98.1°F | Ht 71.0 in | Wt 242.0 lb

## 2021-02-01 DIAGNOSIS — L089 Local infection of the skin and subcutaneous tissue, unspecified: Secondary | ICD-10-CM

## 2021-02-01 DIAGNOSIS — F17200 Nicotine dependence, unspecified, uncomplicated: Secondary | ICD-10-CM

## 2021-02-01 DIAGNOSIS — E11628 Type 2 diabetes mellitus with other skin complications: Secondary | ICD-10-CM | POA: Diagnosis not present

## 2021-02-01 NOTE — Progress Notes (Signed)
[facility]                                                             [address], [place], [place], [address]                                                                  Phn. [phone]; Fax: [phone]                                                                             Date: 02/01/21  Reason for Referral: DFU  Requesting  Provider: Maud Deed  Assessment # Right Foot DFU  -S/p Rt second toe amputation and excisional debridement of dorsal rt forefoot abscess on 6/18. Cultures grew Actinomyces neuii and Finegoldia magna. S/p amoxicillin course for approx 2 weeks  - s/p Rt TMA on 7/6. No cultures sent   - wound seems to be healing well. No cellulitis on exam  # Smoking : counseled   Plan Monitor off abtx Discussed about diabetic foot care/chances of recurrence of infection Discussed about blood glucose control and smoking cessation Follow up with Orthopedics as instructed  Follow up as needed with [place]   All questions and concerns were discussed and addressed. Patient verbalized understanding of the plan. ____________________________________________________________________________________________________________________  HPI: 50 Year old female with PMH of Type 1 DM, HTN, HLD, Depression/Anxiety and Asthma who is referred for evaluation of Rt Foot DFU. Patient  had Rt second toe amputation and excisional debridement of dorsal rt forefoot abscess on 6/18. Cultures at that time grew Actinomyces neuii and Finegoldia magna. She was given 2 weeks worth of Amoxicillin during discharge which she took as instructed. This was followed by a rt TMA on 7/6 after found to have progressive ischemic changes to the distal aspect of the incision with exposed bone. No cultures were taken at this time. Amoxicillin was discontinued after the TMA and she had been off abtx since then. She had  post op visits with Ortho with last visit on 8/2 where wound has thought to be healing well.   She tells me she is Type ! DM and follows up with Endocrinology. She is on insulin. She also smokes 2 cigarettes a day and trying to cut down on it. Denies any pain/swelling/tenderness in the RT TMA site. Denies taking any antibiotics currently but took almost 2 weeks worth of amoxicillin after her 1st surgery until finally having the RT TMA done.    ROS: Constitutional: Negative for fever, chills, activity change, appetite change, fatigue and unexpected weight change.  Respiratory: Negative for cough, shortness of breath Cardiovascular: Negative for chest pain, palpitations and leg swelling.  Gastrointestinal: Negative for nausea, vomiting, abdominal pain, diarrhea/constipation, .  Genitourinary: Negative for dysuria, hematuria,  flank pain Musculoskeletal: Negative for myalgias, arthralgia, back pain, joint swelling, arthralgias Skin: Negative for rashes, lesions  Neurological: Negative for weakness, dizziness or headache  Past Medical History:  Diagnosis Date   Anemia    Asthma    as a child   Depression    Diabetes mellitus without complication (HCC)    Type 1   Headache    migraines   High cholesterol    Hypertension    Past Surgical History:  Procedure Laterality Date   AMPUTATION Right 12/22/2020   Procedure: RIGHT TRANSMETATARSAL AMPUTATION;  Surgeon: [name], MD;  Location[facility];  Service: Orthopedics;  Laterality: Right;   AMPUTATION TOE Right 12/04/2020   Procedure: AMPUTATION RIGHT SECOND TOE, I&D FOOT;  Surgeon: Eldred Manges, MD;  Location: [facility] ORS;  Service: Orthopedics;  Laterality: Right;  Right second toe amputation, I&D right foot.   birth control implant     TONSILLECTOMY     TYMPANOSTOMY TUBE PLACEMENT     Current Outpatient Medications on File Prior to Visit  Medication Sig Dispense Refill   amLODipine (NORVASC) 10 MG tablet Take 10 mg by mouth daily.      aspirin EC 81 MG tablet Take 81 mg by mouth at bedtime. Swallow whole.     atorvastatin (LIPITOR) 80 MG tablet Take 80 mg by mouth at bedtime.     cholecalciferol (VITAMIN D3) 25 MCG (1000 UNIT) tablet Take 1,000 Units by mouth daily.     ferrous sulfate 325 (65 FE) MG tablet Take 1 tablet (325 mg total) by mouth daily with breakfast. 30 tablet 0   gabapentin (NEURONTIN) 300 MG capsule Take 1 capsule (300 mg total) by mouth 3 (three) times daily. 3 times a day when necessary neuropathy pain 90 capsule 3   ibuprofen (ADVIL,MOTRIN) 200 MG tablet Take 600 mg by mouth every 6 (six) hours as needed for mild pain.     Insulin Glargine (TOUJEO SOLOSTAR ) Inject 48 Units into the skin at bedtime.     Insulin Human (INSULIN PUMP) SOLN Inject into the skin. Novolog 100 units/ml     insulin lispro (HUMALOG) 100 UNIT/ML injection Inject into the skin 3 (three) times daily before meals. Gives between 12-42 units     lisinopril (ZESTRIL) 40 MG tablet Take 40 mg by mouth daily.     metoCLOPramide (REGLAN) 10 MG tablet Take 10 mg by mouth every 6 (six) hours as needed for nausea.     ondansetron (ZOFRAN) 4 MG tablet Take 1 tablet (4 mg total) by mouth every 6 (six) hours as needed for nausea. 10 tablet 0   oxyCODONE-acetaminophen (PERCOCET) 5-325 MG tablet Take 1 tablet by mouth every 4 (four) hours as needed for severe pain. 30 tablet 0   SUMAtriptan (IMITREX) 25 MG tablet Take 25 mg by mouth every 2 (two) hours as needed for migraine. May repeat in 2 hours if headache persists or recurs.     No current facility-administered medications on file prior to visit.   Allergies  Allergen Reactions   Coconut Oil Anaphylaxis   Pineapple Anaphylaxis   Tomato Anaphylaxis   Latex Hives   Metformin And Related Other (See Comments)    Constipation/cramps   Social History   Socioeconomic History   Marital status: Married    Spouse name: Not on file   Number of children: Not on file   Years of education: Not on  file   Highest education level: Not on file  Occupational  History   Not on file  Tobacco Use   Smoking status: Every Day    Packs/day: 0.25    Types: Cigarettes   Smokeless tobacco: Never  Vaping Use   Vaping Use: Never used  Substance and Sexual Activity   Alcohol use: Never   Drug use: Never   Sexual activity: Not on file  Other Topics Concern   Not on file  Social History Narrative   Not on file   Social Determinants of Health   Financial Resource Strain: Not on file  Food Insecurity: Not on file  Transportation Needs: Not on file  Physical Activity: Not on file  Stress: Not on file  Social Connections: Not on file  Intimate Partner Violence: Not on file    Vitals BP 101/69   Pulse 100   Temp 98.1 F (36.7 C) (Oral)   Ht  (1.803 m)   Wt 242 lb (109.8 kg)   LMP 01/01/2021 (Within Days)   SpO2 100%   BMI 33.75 kg/m   Examination  General - not in acute distress, comfortably sitting in chair, OBESE  HEENT - PEERLA, no pallor and no icterus Chest - b/l clear air entry, no additional sounds CVS- Normal s1s2, RRR Abdomen - Soft, Non tender , non distended Ext- no pedal edema      Neuro: grossly normal Back - WNL Psych : calm and cooperative   Recent labs CBC Latest Ref Rng & Units 12/06/2020 12/05/2020 12/04/2020  WBC 4.0 - 10.5 K/uL 11.2(H) 11.1(H) 10.3  Hemoglobin 12.0 - 15.0 g/dL 10.4(L) 9.4(L) 10.1(L)  Hematocrit 36.0 - 46.0 % 32.0(L) 29.0(L) 31.6(L)  Platelets 150 - 400 K/uL 315 298 274   CMP Latest Ref Rng & Units 12/09/2020 12/06/2020 12/05/2020  Glucose 70 - 99 mg/dL - 914(N) 829(F)  BUN 6 - 20 mg/dL - 7 7  Creatinine 6.21 - 1.00 mg/dL 3.08 6.57 8.46  Sodium 135 - 145 mmol/L - 135 137  Potassium 3.5 - 5.1 mmol/L - 4.3 3.9  Chloride 98 - 111 mmol/L - 99 104  CO2 22 - 32 mmol/L - 30 27  Calcium 8.9 - 10.3 mg/dL - 8.7(L) 8.4(L)  Total Protein 6.5 - 8.1 g/dL - - -  Total Bilirubin 0.3 - 1.2 mg/dL - - -  Alkaline Phos 38 - 126 U/L - - -   AST 15 - 41 U/L - - -  ALT 0 - 44 U/L - - -     Pertinent Microbiology Results for orders placed or performed during the hospital encounter of 12/02/20  Culture, blood (routine x 2)     Status: None   Collection Time: 12/02/20  5:43 PM   Specimen: BLOOD  Result Value Ref Range Status   Specimen Description   Final    BLOOD RIGHT ANTECUBITAL Performed at [facility], [address] [place], [place] [address]    Special Requests   Final    BOTTLES DRAWN AEROBIC AND ANAEROBIC Blood Culture adequate volume Performed at [facility], [address] [place], [place] [address]    Culture   Final    NO GROWTH 5 DAYS Performed at [facility], [address] [place], [place] [address]    Report Status 12/15/2020 FINAL  Final  Resp Panel by RT-PCR (Flu A&B, Covid) Nasopharyngeal Swab     Status: None   Collection Time: 12/02/20  7:06 PM   Specimen: Nasopharyngeal Swab; Nasopharyngeal(NP) swabs in vial  transport medium  Result Value Ref Range Status   SARS Coronavirus 2 by RT PCR NEGATIVE NEGATIVE Final    Comment: (NOTE) SARS-CoV-2 target nucleic acids are NOT DETECTED.  The SARS-CoV-2 RNA is generally detectable in upper respiratory specimens during the acute phase of infection. The lowest concentration of SARS-CoV-2 viral copies this assay can detect is 138 copies/mL. A negative result does not preclude SARS-Cov-2 infection and should not be used as the sole basis for treatment or other patient management decisions. A negative result may occur with  improper specimen collection/handling, submission of specimen other than nasopharyngeal swab, presence of viral mutation(s) within the areas targeted by this assay, and inadequate number of viral copies(<138 copies/mL). A negative result must be combined with clinical observations, patient history, and epidemiological information. The expected result is Negative.  Fact Sheet for Patients:   BloggerCourse.com  Fact Sheet for Healthcare Providers:  SeriousBroker.it  This test is no t yet approved or cleared by the [place] FDA and  has been authorized for detection and/or diagnosis of SARS-CoV-2 by FDA under an Emergency Use Authorization (EUA). This EUA will remain  in effect (meaning this test can be used) for the duration of the COVID-19 declaration under Section 564(b)(1) of the Act, 21 U.S.C.section 360bbb-3(b)(1), unless the authorization is terminated  or revoked sooner.       Influenza A by PCR NEGATIVE NEGATIVE Final   Influenza B by PCR NEGATIVE NEGATIVE Final    Comment: (NOTE) The Xpert Xpress SARS-CoV-2/FLU/RSV plus assay is intended as an aid in the diagnosis of influenza from Nasopharyngeal swab specimens and should not be used as a sole basis for treatment. Nasal washings and aspirates are unacceptable for Xpert Xpress SARS-CoV-2/FLU/RSV testing.  Fact Sheet for Patients: BloggerCourse.com  Fact Sheet for Healthcare Providers: SeriousBroker.it  This test is not yet approved or cleared by the [place] FDA and has been authorized for detection and/or diagnosis of SARS-CoV-2 by FDA under an Emergency Use Authorization (EUA). This EUA will remain in effect (meaning this test can be used) for the duration of the COVID-19 declaration under Section 564(b)(1) of the Act, 21 U.S.C. section 360bbb-3(b)(1), unless the authorization is terminated or revoked.  Performed at [facility], [address] [place], [place] [address]   Culture, blood (routine x 2)     Status: None   Collection Time: 12/02/20  8:41 PM   Specimen: BLOOD  Result Value Ref Range Status   Specimen Description   Final    BLOOD LEFT ANTECUBITAL Performed at [facility], [address] [place], [place] [address]    Special Requests   Final    BOTTLES  DRAWN AEROBIC AND ANAEROBIC Blood Culture adequate volume Performed at [facility], [address] [place], [place] [address]    Culture   Final    NO GROWTH 5 DAYS Performed at [facility], [address], [place], [place] [address]    Report Status 12/15/2020 FINAL  Final  MRSA PCR Screening     Status: None   Collection Time: 12/02/20 10:49 PM  Result Value Ref Range Status   MRSA by PCR NEGATIVE NEGATIVE Final    Comment:        The GeneXpert MRSA Assay (FDA approved for NASAL specimens only), is one component of a comprehensive MRSA colonization surveillance program. It is not intended to diagnose MRSA infection nor to guide or monitor treatment for MRSA  infections. Performed at [facility], [address] [place], [place] [address]   Aerobic/Anaerobic Culture w Gram Stain (surgical/deep wound)     Status: None   Collection Time: 12/04/20  3:09 PM   Specimen: Wound  Result Value Ref Range Status   Specimen Description   Final    WOUND Performed at [facility], [address] [place], [place] [address]    Special Requests   Final    NONE Performed at [facility], [address] [place], [place] [address]    Gram Stain   Final    ABUNDANT WBC PRESENT,BOTH PMN AND MONONUCLEAR RARE GRAM POSITIVE COCCI Performed at [facility], [address] [place], [place] [address]    Culture   Final    RARE ACTINOMYCES NEUII Standardized susceptibility testing for this organism is not available. RARE Alphonse GuildFINEGOLDIA MAGNA    Report Status 12/11/2020 FINAL  Final    Pertinent Imaging MRI Rt Foot 12/03/20 IMPRESSION: 1. Acute osteomyelitis of the proximal phalanx of the right second toe. 2. Bone marrow edema within the middle phalanx of the second toe may represent reactive osteitis versus early acute osteomyelitis. 3. Fluid and air containing collection predominantly located within the  dorsal soft tissues of the forefoot extending to the base of the second toe measuring up to 7.7 x 1.7 x 2.9 cm compatible with abscess. Fluid collection extends into the first intermetatarsal space. 4. Small first MTP joint effusion which may be reactive or represent septic arthritis. 5. Diffuse intramuscular edema suggesting myositis.   All pertinent labs/Imagings/notes reviewed. All pertinent plain films and CT images have been personally visualized and interpreted; radiology reports have been reviewed. Decision making incorporated into the Impression / Recommendations.  I have spent a total of 60 minutes of face-to-face and non-face-to-face time, excluding clinical staff time, preparing to see patient, ordering tests and/or medications, and provide counseling the patient    Electronically signed by:  Odette FractionSabina Eugune Sine, MD Infectious Disease Physician [facility] [address] [place], [place] [address] Phone: [phone]  Fax: [phone]

## 2021-02-15 ENCOUNTER — Other Ambulatory Visit: Payer: Self-pay | Admitting: Physician Assistant

## 2021-02-15 ENCOUNTER — Encounter: Payer: Self-pay | Admitting: Physician Assistant

## 2021-02-15 ENCOUNTER — Ambulatory Visit (INDEPENDENT_AMBULATORY_CARE_PROVIDER_SITE_OTHER): Payer: Managed Care, Other (non HMO) | Admitting: Physician Assistant

## 2021-02-15 ENCOUNTER — Other Ambulatory Visit: Payer: Self-pay

## 2021-02-15 DIAGNOSIS — Z89431 Acquired absence of right foot: Secondary | ICD-10-CM

## 2021-02-15 NOTE — Progress Notes (Signed)
Office Visit Note   Patient: Valerie Le           Date of Birth: 11/18/1970           MRN: [id number] Visit Date: 02/15/2021              Requested by: Maud Deed, [place] [address] [place],  [place] [address] PCP: Maud Deed, [place]  Chief Complaint  Patient presents with   Right Foot - Follow-up      HPI: Patient is status post transmetatarsal amputation overall she is doing well.  She is back to work wearing regular shoe.  She did pull off an eschar on the edge of the wound yesterday and has had some drainage.  No foul odor no redness or increase in pain   Assessment & Plan: Visit Diagnoses: No diagnosis found.  Plan: She will apply Band-Aid with a small amount of Bactroban to the area.  Once discharged to dry out she may stop this.  Would also like her to wear more of a open box shoe so she is not having any rubbing in the area.  Follow-up for final visit in 2 weeks sooner if any concerns  Follow-Up Instructions: No follow-ups on file.   Ortho Exam  Patient is alert, oriented, no adenopathy, well-dressed, normal affect, normal respiratory effort. Examination overall well-healed incision on the periphery of the incision she has a small area where the eschar is pulled off this does not probe deeply there is no foul odor no surrounding cellulitis minimal serous drainage no tunneling  Imaging: No results found. No images are attached to the encounter.  Labs: Lab Results  Component Value Date   HGBA1C 13.1 (H) 12/02/2020   REPTSTATUS 12/11/2020 FINAL 12/04/2020   GRAMSTAIN  12/04/2020    ABUNDANT WBC PRESENT,BOTH PMN AND MONONUCLEAR RARE GRAM POSITIVE COCCI Performed at [facility], [address], [place], [place] [address]    CULT  12/04/2020    RARE ACTINOMYCES NEUII Standardized susceptibility testing for this organism is not available. RARE FINEGOLDIA MAGNA      Lab Results  Component Value Date   ALBUMIN  2.6 (L) 12/03/2020   ALBUMIN 2.9 (L) 12/02/2020    No results found for: MG No results found for: VD25OH  No results found for: PREALBUMIN CBC EXTENDED Latest Ref Rng & Units 12/06/2020 12/05/2020 12/04/2020  WBC 4.0 - 10.5 K/uL 11.2(H) 11.1(H) 10.3  RBC 3.87 - 5.11 MIL/uL 4.24 3.80(L) 4.15  HGB 12.0 - 15.0 g/dL 10.4(L) 9.4(L) 10.1(L)  HCT 36.0 - 46.0 % 32.0(L) 29.0(L) 31.6(L)  PLT 150 - 400 K/uL 315 298 274  NEUTROABS 1.7 - 7.7 K/uL - - -  LYMPHSABS 0.7 - 4.0 K/uL - - -     There is no height or weight on file to calculate BMI.  Orders:  No orders of the defined types were placed in this encounter.  No orders of the defined types were placed in this encounter.    Procedures: No procedures performed  Clinical Data: No additional findings.  ROS:  All other systems negative, except as noted in the HPI. Review of Systems  Objective: Vital Signs: There were no vitals taken for this visit.  Specialty Comments:  No specialty comments available.  PMFS History: Patient Active Problem List   Diagnosis Date Noted   Smoking 02/01/2021   Acute hematogenous osteomyelitis of right foot (HCC) 12/22/2020   Osteomyelitis of foot, right, acute (HCC)  Dehiscence of amputation stump (HCC)    Diabetic foot infection (HCC) 12/02/2020   Essential hypertension 12/02/2020   Hyperlipidemia associated with type 2 diabetes mellitus (HCC) 08/21/2018   Bipolar affective disorder, currently depressed, moderate (HCC) 01/23/2017   Morbid obesity due to excess calories (HCC) 02/15/2016   Insulin-treated type 2 diabetes mellitus (HCC) 03/07/2013   Asthma 03/07/2013   ADHD (attention deficit hyperactivity disorder) 03/07/2013   Migraine 03/07/2013   Tobacco abuse 03/07/2013   Past Medical History:  Diagnosis Date   Anemia    Asthma    as a child   Depression    Diabetes mellitus without complication (HCC)    Type 1   Headache    migraines   High cholesterol    Hypertension      No family history on file.  Past Surgical History:  Procedure Laterality Date   AMPUTATION Right 12/22/2020   Procedure: RIGHT TRANSMETATARSAL AMPUTATION;  Surgeon: [name], MD;  Location[facility] OR;  Service: Orthopedics;  Laterality: Right;   AMPUTATION TOE Right 12/04/2020   Procedure: AMPUTATION RIGHT SECOND TOE, I&D FOOT;  Surgeon: Eldred Manges, MD;  Location: [facility] ORS;  Service: Orthopedics;  Laterality: Right;  Right second toe amputation, I&D right foot.   birth control implant     TONSILLECTOMY     TYMPANOSTOMY TUBE PLACEMENT     Social History   Occupational History   Not on file  Tobacco Use   Smoking status: Every Day    Packs/day: 0.25    Types: Cigarettes   Smokeless tobacco: Never  Vaping Use   Vaping Use: Never used  Substance and Sexual Activity   Alcohol use: Never   Drug use: Never   Sexual activity: Not on file

## 2021-03-01 ENCOUNTER — Encounter: Payer: Self-pay | Admitting: Physician Assistant

## 2021-03-01 ENCOUNTER — Ambulatory Visit (INDEPENDENT_AMBULATORY_CARE_PROVIDER_SITE_OTHER): Payer: Managed Care, Other (non HMO) | Admitting: Physician Assistant

## 2021-03-01 DIAGNOSIS — Z89431 Acquired absence of right foot: Secondary | ICD-10-CM

## 2021-03-01 NOTE — Progress Notes (Signed)
Office Visit Note   Patient: Valerie Le           Date of Birth: 1970/09/16           MRN: [id number] Visit Date: 03/01/2021              Requested by: Maud Deed, [place] [address] [place],  [place] [address] PCP: Maud Deed, [place]  Chief Complaint  Patient presents with   Right Foot - Routine Post Op    12/2020 right transmet amputation       HPI: Patient is a pleasant 50 year old woman who is little over 2 months status post right transmetatarsal amputation.  She has been wearing a sneaker.  She said the have been slow to heal on the medial side of the wound but that seems to have gotten better.  She says she was traveling on a plane recently and noticed the lateral side of the wounds started bleeding.  She also has a thickened callused area.  She denies any fever chil  Assessment & Plan: Visit Diagnoses: No diagnosis found.  Plan: Patient was provided a prescription to go to [facility] to obtain 2 extra-large stump shrinker's.  She should be wearing 1 of these around-the-clock.  Should continue to watch with antibacterial soap and water.  Have asked that she go back into the postop shoe.  We will reevaluate in 2 weeks but will contact us if she has any concerns  Follow-Up Instructions: No follow-ups on file.   Ortho Exam  Patient is alert, oriented, no adenopathy, well-dressed, normal affect, normal respiratory effort. She has a palpable dorsalis pedis pulse.  Wound edges are opposed she has a thickened callus across the bottom surface of the incision.  The medial side has some improved healing.  Lateral side has an area of slight wound dehiscence with maceration of the skin and thickened callus.  There is no surrounding cellulitis no foul odor no purulent drainage.  After verbal consent was obtained this was all debrided to healthy bleeding surfaces..  No sign of acute infection  Imaging: No results found. No images are attached to the  encounter.  Labs: Lab Results  Component Value Date   HGBA1C 13.1 (H) 12/02/2020   REPTSTATUS 12/11/2020 FINAL 12/04/2020   GRAMSTAIN  12/04/2020    ABUNDANT WBC PRESENT,BOTH PMN AND MONONUCLEAR RARE GRAM POSITIVE COCCI Performed at [facility], [address] [place], [place] [address]    CULT  12/04/2020    RARE ACTINOMYCES NEUII Standardized susceptibility testing for this organism is not available. RARE FINEGOLDIA MAGNA      Lab Results  Component Value Date   ALBUMIN 2.6 (L) 12/03/2020   ALBUMIN 2.9 (L) 12/02/2020    No results found for: MG No results found for: VD25OH  No results found for: PREALBUMIN CBC EXTENDED Latest Ref Rng & Units 12/06/2020 12/05/2020 12/04/2020  WBC 4.0 - 10.5 K/uL 11.2(H) 11.1(H) 10.3  RBC 3.87 - 5.11 MIL/uL 4.24 3.80(L) 4.15  HGB 12.0 - 15.0 g/dL 10.4(L) 9.4(L) 10.1(L)  HCT 36.0 - 46.0 % 32.0(L) 29.0(L) 31.6(L)  PLT 150 - 400 K/uL 315 298 274  NEUTROABS 1.7 - 7.7 K/uL - - -  LYMPHSABS 0.7 - 4.0 K/uL - - -     There is no height or weight on file to calculate BMI.  Orders:  No orders of the defined types were placed in this encounter.  No orders of the defined types were placed  in this encounter.    Procedures: No procedures performed  Clinical Data: No additional findings.  ROS:  All other systems negative, except as noted in the HPI. Review of Systems  Objective: Vital Signs: There were no vitals taken for this visit.  Specialty Comments:  No specialty comments available.  PMFS History: Patient Active Problem List   Diagnosis Date Noted   Smoking 02/01/2021   Acute hematogenous osteomyelitis of right foot (HCC) 12/22/2020   Osteomyelitis of foot, right, acute (HCC)    Dehiscence of amputation stump (HCC)    Diabetic foot infection (HCC) 12/02/2020   Essential hypertension 12/02/2020   Hyperlipidemia associated with type 2 diabetes mellitus (HCC) 08/21/2018   Bipolar affective disorder, currently  depressed, moderate (HCC) 01/23/2017   Morbid obesity due to excess calories (HCC) 02/15/2016   Insulin-treated type 2 diabetes mellitus (HCC) 03/07/2013   Asthma 03/07/2013   ADHD (attention deficit hyperactivity disorder) 03/07/2013   Migraine 03/07/2013   Tobacco abuse 03/07/2013   Past Medical History:  Diagnosis Date   Anemia    Asthma    as a child   Depression    Diabetes mellitus without complication (HCC)    Type 1   Headache    migraines   High cholesterol    Hypertension     No family history on file.  Past Surgical History:  Procedure Laterality Date   AMPUTATION Right 12/22/2020   Procedure: RIGHT TRANSMETATARSAL AMPUTATION;  Surgeon: [name], MD;  Location: [facility] OR;  Service: Orthopedics;  Laterality: Right;   AMPUTATION TOE Right 12/04/2020   Procedure: AMPUTATION RIGHT SECOND TOE, I&D FOOT;  Surgeon: Eldred Manges, MD;  Location: [facility] ORS;  Service: Orthopedics;  Laterality: Right;  Right second toe amputation, I&D right foot.   birth control implant     TONSILLECTOMY     TYMPANOSTOMY TUBE PLACEMENT     Social History   Occupational History   Not on file  Tobacco Use   Smoking status: Every Day    Packs/day: 0.25    Types: Cigarettes   Smokeless tobacco: Never  Vaping Use   Vaping Use: Never used  Substance and Sexual Activity   Alcohol use: Never   Drug use: Never   Sexual activity: Not on file

## 2021-03-15 ENCOUNTER — Ambulatory Visit (INDEPENDENT_AMBULATORY_CARE_PROVIDER_SITE_OTHER): Payer: Managed Care, Other (non HMO) | Admitting: Family

## 2021-03-15 ENCOUNTER — Encounter: Payer: Self-pay | Admitting: Family

## 2021-03-15 DIAGNOSIS — L03115 Cellulitis of right lower limb: Secondary | ICD-10-CM

## 2021-03-15 DIAGNOSIS — Z89431 Acquired absence of right foot: Secondary | ICD-10-CM

## 2021-03-15 MED ORDER — NITROGLYCERIN 0.2 MG/HR TD PT24: 0.2000 mg | MEDICATED_PATCH | Freq: Every day | TRANSDERMAL | 1 refills | Status: DC

## 2021-03-15 MED ORDER — DOXYCYCLINE HYCLATE 100 MG PO TABS: 100.0000 mg | ORAL_TABLET | Freq: Two times a day (BID) | ORAL | 0 refills | Status: DC

## 2021-03-15 NOTE — Progress Notes (Signed)
Post-Op Visit Note   Patient: Valerie Le           Date of Birth: 1970-09-29           MRN: [id number] Visit Date: 03/15/2021 PCP: Maud Deed, [place]  Chief Complaint: No chief complaint on file.   HPI:  HPI The patient is a 50 year old woman who is 3 months status post right transmetatarsal amputation.  She has been in regular shoewear. Fortunately she did have an area of dehiscence of the lateral side of her incision.  At last visit she was instructed to obtain medical compression stump shrinker's to wear to her foot.  With direct skin contact.  She was instructed to return in to her postop shoe as well  Ortho Exam On examination of the right foot on the medial end of her incision there is a dime sized area that has not yet healed this probes 3 mm deep there is necrotic tissue and scant serous drainage there is no surrounding erythema or maceration.  Laterally at the end of her incision there is hyperkeratotic tissue this was debrided with a 10 blade knife.  On the this lateral end there is again a nickel sized area of this probes 5 mm deep there is serous drainage there is some surrounding erythema and mild swelling over her dorsum of her foot..  Does have a bounding dorsalis pedis pulse.  Visit Diagnoses: No diagnosis found.  Plan: Given a prescription for doxycycline and nitroglycerin patches.  She will continue with her medical compression stocking with direct skin contact.  Today mupirocin Band-Aids applied to the 2 open areas  Follow-Up Instructions: No follow-ups on file.   Imaging: No results found.  Orders:  No orders of the defined types were placed in this encounter.  No orders of the defined types were placed in this encounter.    PMFS History: Patient Active Problem List   Diagnosis Date Noted   Smoking 02/01/2021   Acute hematogenous osteomyelitis of right foot (HCC) 12/22/2020   Osteomyelitis of foot, right, acute (HCC)    Dehiscence of  amputation stump (HCC)    Diabetic foot infection (HCC) 12/02/2020   Essential hypertension 12/02/2020   Hyperlipidemia associated with type 2 diabetes mellitus (HCC) 08/21/2018   Bipolar affective disorder, currently depressed, moderate (HCC) 01/23/2017   Morbid obesity due to excess calories (HCC) 02/15/2016   Insulin-treated type 2 diabetes mellitus (HCC) 03/07/2013   Asthma 03/07/2013   ADHD (attention deficit hyperactivity disorder) 03/07/2013   Migraine 03/07/2013   Tobacco abuse 03/07/2013   Past Medical History:  Diagnosis Date   Anemia    Asthma    as a child   Depression    Diabetes mellitus without complication (HCC)    Type 1   Headache    migraines   High cholesterol    Hypertension     History reviewed. No pertinent family history.  Past Surgical History:  Procedure Laterality Date   AMPUTATION Right 12/22/2020   Procedure: RIGHT TRANSMETATARSAL AMPUTATION;  Surgeon: [name], MD;  Location: [facility] OR;  Service: Orthopedics;  Laterality: Right;   AMPUTATION TOE Right 12/04/2020   Procedure: AMPUTATION RIGHT SECOND TOE, I&D FOOT;  Surgeon: Eldred Manges, MD;  Location: [facility];  Service: Orthopedics;  Laterality: Right;  Right second toe amputation, I&D right foot.   birth control implant     TONSILLECTOMY     TYMPANOSTOMY TUBE PLACEMENT     Social History   Occupational  History   Not on file  Tobacco Use   Smoking status: Every Day    Packs/day: 0.25    Types: Cigarettes   Smokeless tobacco: Never  Vaping Use   Vaping Use: Never used  Substance and Sexual Activity   Alcohol use: Never   Drug use: Never   Sexual activity: Not on file

## 2021-03-22 ENCOUNTER — Ambulatory Visit (INDEPENDENT_AMBULATORY_CARE_PROVIDER_SITE_OTHER): Payer: Managed Care, Other (non HMO) | Admitting: Family

## 2021-03-22 DIAGNOSIS — M86171 Other acute osteomyelitis, right ankle and foot: Secondary | ICD-10-CM

## 2021-03-29 ENCOUNTER — Encounter: Payer: Managed Care, Other (non HMO) | Admitting: Family

## 2021-04-12 ENCOUNTER — Encounter: Payer: Self-pay | Admitting: Family

## 2021-04-12 ENCOUNTER — Ambulatory Visit (INDEPENDENT_AMBULATORY_CARE_PROVIDER_SITE_OTHER): Payer: Managed Care, Other (non HMO) | Admitting: Family

## 2021-04-12 DIAGNOSIS — Z89431 Acquired absence of right foot: Secondary | ICD-10-CM

## 2021-04-12 NOTE — Progress Notes (Signed)
Post-Op Visit Note   Patient: Valerie Le           Date of Birth: Jan 11, 1971           MRN: [id number] Visit Date: 04/12/2021 PCP: Maud Deed, [place]  Chief Complaint: No chief complaint on file.   HPI:  HPI The patient is a 50 year old woman seen status post right transmetatarsal amputation July 6.  She has 2 areas that have been slow to heal she has not been applying a dressing.  She feels like when she wears a Band-Aid these get wet and drained she has been using nitroglycerin patches daily she recently completed a course of doxycycline and is pleased with her progress Ortho Exam On examination of right foot. No edema. No erythema. Does have two remaining open areas to incision. Medially there is a 1 cm in length opening, probes 5 mm deep. No drainage. No probing to bone or tendon. No maceration. Lateral end of incision with 10 mm in diameter open ulcer. Is 5 mm deep. No probing to bone or tendon. Surrounding nonviable tissue debrided with 10 blade knife back to viable tissue. No odor.   Visit Diagnoses:  1. History of transmetatarsal amputation of right foot (HCC)     Plan: continue daily dial soap cleansing. Dry dressings. Close monitoring.  Follow-Up Instructions: No follow-ups on file.   Imaging: No results found.  Orders:  No orders of the defined types were placed in this encounter.  No orders of the defined types were placed in this encounter.    PMFS History: Patient Active Problem List   Diagnosis Date Noted   Smoking 02/01/2021   Acute hematogenous osteomyelitis of right foot (HCC) 12/22/2020   Osteomyelitis of foot, right, acute (HCC)    Dehiscence of amputation stump (HCC)    Diabetic foot infection (HCC) 12/02/2020   Essential hypertension 12/02/2020   Hyperlipidemia associated with type 2 diabetes mellitus (HCC) 08/21/2018   Bipolar affective disorder, currently depressed, moderate (HCC) 01/23/2017   Morbid obesity due to excess calories  (HCC) 02/15/2016   Insulin-treated type 2 diabetes mellitus (HCC) 03/07/2013   Asthma 03/07/2013   ADHD (attention deficit hyperactivity disorder) 03/07/2013   Migraine 03/07/2013   Tobacco abuse 03/07/2013   Past Medical History:  Diagnosis Date   Anemia    Asthma    as a child   Depression    Diabetes mellitus without complication (HCC)    Type 1   Headache    migraines   High cholesterol    Hypertension     No family history on file.  Past Surgical History:  Procedure Laterality Date   AMPUTATION Right 12/22/2020   Procedure: RIGHT TRANSMETATARSAL AMPUTATION;  Surgeon: [name], MD;  Location: [facility] OR;  Service: Orthopedics;  Laterality: Right;   AMPUTATION TOE Right 12/04/2020   Procedure: AMPUTATION RIGHT SECOND TOE, I&D FOOT;  Surgeon: Eldred Manges, MD;  Location: [facility];  Service: Orthopedics;  Laterality: Right;  Right second toe amputation, I&D right foot.   birth control implant     TONSILLECTOMY     TYMPANOSTOMY TUBE PLACEMENT     Social History   Occupational History   Not on file  Tobacco Use   Smoking status: Every Day    Packs/day: 0.25    Types: Cigarettes   Smokeless tobacco: Never  Vaping Use   Vaping Use: Never used  Substance and Sexual Activity   Alcohol use: Never   Drug use: Never  Sexual activity: Not on file

## 2021-04-13 ENCOUNTER — Encounter: Payer: Self-pay | Admitting: Family

## 2021-04-13 NOTE — Progress Notes (Signed)
Post-Op Visit Note   Patient: Valerie Le           Date of Birth: 02-16-1971           MRN: [id number] Visit Date: 03/22/2021 PCP: Maud Deed, PA  Chief Complaint:  Chief Complaint  Patient presents with   Right Foot - Routine Post Op    12/22/2020 right transmet amputation     HPI:  HPI The patient is a 50 year old woman seen status post right transmetatarsal amputation on July 6 of this year.  She is in regular shoewear continues with 2 open areas 1 on each end of her incision she has been using nitroglycerin patches and dry dressings.  She continues on doxycycline.  Ortho Exam On examination of the right foot there is resolving cellulitis some dry peeling skin minimal erythema minimal edema The incision is healed centrally on the medial and lateral end there are open areas the medial end is 3 mm deep this is 1 cm in diameter there is no necrotic tissue today no active drainage no surrounding erythema or maceration on the lateral end there is a dime sized ulcer this is 5 mm deep there is fibrinous tissue in the wound bed scant serous drainage no surrounding erythema no odor Visit Diagnoses:  1. Osteomyelitis of foot, right, acute (HCC)     Plan: Nonviable tissue debrided with a 10 blade knife back to viable tissue she will continue with daily Dial soap cleansing.  Medical compression stocking with direct skin contact.  Minimize weightbearing.  Elevate for swelling follow-up in the office in 2 weeks.  Follow-Up Instructions: Return in about 2 weeks (around 04/05/2021).   Imaging: No results found.  Orders:  No orders of the defined types were placed in this encounter.  No orders of the defined types were placed in this encounter.    PMFS History: Patient Active Problem List   Diagnosis Date Noted   Smoking 02/01/2021   Acute hematogenous osteomyelitis of right foot (HCC) 12/22/2020   Osteomyelitis of foot, right, acute (HCC)    Dehiscence of amputation  stump (HCC)    Diabetic foot infection (HCC) 12/02/2020   Essential hypertension 12/02/2020   Hyperlipidemia associated with type 2 diabetes mellitus (HCC) 08/21/2018   Bipolar affective disorder, currently depressed, moderate (HCC) 01/23/2017   Morbid obesity due to excess calories (HCC) 02/15/2016   Insulin-treated type 2 diabetes mellitus (HCC) 03/07/2013   Asthma 03/07/2013   ADHD (attention deficit hyperactivity disorder) 03/07/2013   Migraine 03/07/2013   Tobacco abuse 03/07/2013   Past Medical History:  Diagnosis Date   Anemia    Asthma    as a child   Depression    Diabetes mellitus without complication (HCC)    Type 1   Headache    migraines   High cholesterol    Hypertension     History reviewed. No pertinent family history.  Past Surgical History:  Procedure Laterality Date   AMPUTATION Right 12/22/2020   Procedure: RIGHT TRANSMETATARSAL AMPUTATION;  Surgeon: [name], MD;  Location: [facility] OR;  Service: Orthopedics;  Laterality: Right;   AMPUTATION TOE Right 12/04/2020   Procedure: AMPUTATION RIGHT SECOND TOE, I&D FOOT;  Surgeon: Eldred Manges, MD;  Location: [facility];  Service: Orthopedics;  Laterality: Right;  Right second toe amputation, I&D right foot.   birth control implant     TONSILLECTOMY     TYMPANOSTOMY TUBE PLACEMENT     Social History   Occupational History  Not on file  Tobacco Use   Smoking status: Every Day    Packs/day: 0.25    Types: Cigarettes   Smokeless tobacco: Never  Vaping Use   Vaping Use: Never used  Substance and Sexual Activity   Alcohol use: Never   Drug use: Never   Sexual activity: Not on file

## 2021-04-29 ENCOUNTER — Encounter: Payer: Self-pay | Admitting: Internal Medicine

## 2021-05-03 ENCOUNTER — Ambulatory Visit (INDEPENDENT_AMBULATORY_CARE_PROVIDER_SITE_OTHER): Payer: Managed Care, Other (non HMO)

## 2021-05-03 ENCOUNTER — Ambulatory Visit (INDEPENDENT_AMBULATORY_CARE_PROVIDER_SITE_OTHER): Payer: Managed Care, Other (non HMO) | Admitting: Family

## 2021-05-03 ENCOUNTER — Encounter: Payer: Self-pay | Admitting: Family

## 2021-05-03 DIAGNOSIS — M86171 Other acute osteomyelitis, right ankle and foot: Secondary | ICD-10-CM | POA: Diagnosis not present

## 2021-05-18 NOTE — Progress Notes (Signed)
Post-Op Visit Note   Patient: Valerie Le           Date of Birth: 11/26/1970           MRN: [id number] Visit Date: 05/03/2021 PCP: Maud Deed, PA  Chief Complaint:  Chief Complaint  Patient presents with   Right Foot - Follow-up, Wound Check    S/p right TMA 12/22/20     HPI:  HPI The patient is a 50 year old woman seen in postoperative follow-up.  Status post right transmetatarsal amputation July 6 of this year.  This has been slow to heal.  Over the medial border there had been ulceration this is healed.  She is pleased.  Unfortunately she continues with ulcer over the lateral end of her incision she has been getting a little bit of drainage.  She has completed a recent course of doxycycline and is also using nitroglycerin patches  Ortho Exam On examination of right foot. No edema. No erythema.  Has 1 remaining open area to the lateral end of her transmetatarsal amputation this is 8 mm in diameter this does probe 1 cm deep.  This does not probe to bone.   Scant serosanguineous drainage. no odor.  No erythema or warmth to her foot   Visit Diagnoses:  1. Osteomyelitis of foot, right, acute (HCC)     Plan: continue daily dial soap cleansing.  Pack open with silver cell. Close monitoring.  Discussed concern for further limb salvage surgery.  The patient would like to continue with conservative measures.  She we will follow-up with Dr. Lajoyce Corners.  Discussed return precautions  Follow-Up Instructions: Return in about 16 days (around 05/19/2021).   Imaging: No results found.  Orders:  Orders Placed This Encounter  Procedures   XR Foot 2 Views Right    No orders of the defined types were placed in this encounter.    PMFS History: Patient Active Problem List   Diagnosis Date Noted   Smoking 02/01/2021   Acute hematogenous osteomyelitis of right foot (HCC) 12/22/2020   Osteomyelitis of foot, right, acute (HCC)    Dehiscence of amputation stump (HCC)     Diabetic foot infection (HCC) 12/02/2020   Essential hypertension 12/02/2020   Hyperlipidemia associated with type 2 diabetes mellitus (HCC) 08/21/2018   Bipolar affective disorder, currently depressed, moderate (HCC) 01/23/2017   Morbid obesity due to excess calories (HCC) 02/15/2016   Insulin-treated type 2 diabetes mellitus (HCC) 03/07/2013   Asthma 03/07/2013   ADHD (attention deficit hyperactivity disorder) 03/07/2013   Migraine 03/07/2013   Tobacco abuse 03/07/2013   Past Medical History:  Diagnosis Date   Anemia    Asthma    as a child   Depression    Diabetes mellitus without complication (HCC)    Type 1   Headache    migraines   High cholesterol    Hypertension     History reviewed. No pertinent family history.  Past Surgical History:  Procedure Laterality Date   AMPUTATION Right 12/22/2020   Procedure: RIGHT TRANSMETATARSAL AMPUTATION;  Surgeon: [name], MD;  Location: [facility] OR;  Service: Orthopedics;  Laterality: Right;   AMPUTATION TOE Right 12/04/2020   Procedure: AMPUTATION RIGHT SECOND TOE, I&D FOOT;  Surgeon: Eldred Manges, MD;  Location: [facility];  Service: Orthopedics;  Laterality: Right;  Right second toe amputation, I&D right foot.   birth control implant     TONSILLECTOMY     TYMPANOSTOMY TUBE PLACEMENT     Social History  Occupational History   Not on file  Tobacco Use   Smoking status: Every Day    Packs/day: 0.25    Types: Cigarettes   Smokeless tobacco: Never  Vaping Use   Vaping Use: Never used  Substance and Sexual Activity   Alcohol use: Never   Drug use: Never   Sexual activity: Not on file

## 2021-05-20 ENCOUNTER — Ambulatory Visit: Payer: Managed Care, Other (non HMO) | Admitting: Internal Medicine

## 2021-05-23 ENCOUNTER — Ambulatory Visit: Payer: Managed Care, Other (non HMO) | Admitting: Internal Medicine

## 2021-05-24 ENCOUNTER — Other Ambulatory Visit: Payer: Self-pay

## 2021-05-24 ENCOUNTER — Ambulatory Visit (INDEPENDENT_AMBULATORY_CARE_PROVIDER_SITE_OTHER): Payer: Managed Care, Other (non HMO) | Admitting: Orthopedic Surgery

## 2021-05-24 DIAGNOSIS — L97511 Non-pressure chronic ulcer of other part of right foot limited to breakdown of skin: Secondary | ICD-10-CM | POA: Diagnosis not present

## 2021-05-24 DIAGNOSIS — Z89431 Acquired absence of right foot: Secondary | ICD-10-CM | POA: Diagnosis not present

## 2021-05-27 ENCOUNTER — Encounter: Payer: Self-pay | Admitting: Physician Assistant

## 2021-06-08 ENCOUNTER — Other Ambulatory Visit: Payer: Self-pay

## 2021-06-08 ENCOUNTER — Ambulatory Visit (INDEPENDENT_AMBULATORY_CARE_PROVIDER_SITE_OTHER): Payer: Managed Care, Other (non HMO) | Admitting: Family

## 2021-06-08 ENCOUNTER — Encounter: Payer: Self-pay | Admitting: Orthopedic Surgery

## 2021-06-08 ENCOUNTER — Encounter: Payer: Self-pay | Admitting: Family

## 2021-06-08 DIAGNOSIS — M86271 Subacute osteomyelitis, right ankle and foot: Secondary | ICD-10-CM

## 2021-06-08 DIAGNOSIS — Z89431 Acquired absence of right foot: Secondary | ICD-10-CM | POA: Diagnosis not present

## 2021-06-08 DIAGNOSIS — M86171 Other acute osteomyelitis, right ankle and foot: Secondary | ICD-10-CM

## 2021-06-08 MED ORDER — DOXYCYCLINE HYCLATE 100 MG PO TABS: 100.0000 mg | ORAL_TABLET | Freq: Two times a day (BID) | ORAL | 0 refills | Status: DC

## 2021-06-08 NOTE — Progress Notes (Signed)
Office Visit Note   Patient: Valerie Le           Date of Birth: 31-Dec-1970           MRN: [id number] Visit Date: 06/08/2021              Requested by: Maud Deed, [place] [address] [place],  [place] [address] PCP: Maud Deed, [place]  Chief Complaint  Patient presents with   Right Foot - Wound Check      HPI: Patient is a 50 year old woman who presents with a plantar ulcer right foot over the lateral aspect of a transmetatarsal amputation.  She is using a nitroglycerin patch.  She has completed a course of doxycycline.  Unfortunately is now having worse drainage, pain, swelling and redness  Assessment & Plan: Visit Diagnoses:  No diagnosis found.   Plan: Dressing changes daily. Will place on Doxycycline. Will plan for revision surgery. Discussed return precautions. Any worsening or systemic symptoms is to call or report to ED.  Follow-Up Instructions: Return post op.   Ortho Exam  Patient is alert, oriented, no adenopathy, well-dressed, normal affect, normal respiratory effort.  Examination patient has a transmetatarsal amputation on the right. The foot is mildly erythematous and tender. Mild warmth. There is an ulcer laterally.  This probes to bone today. There is serosanguinous drainage. No odor. No ascending cellulitis.  Imaging: No results found. No images are attached to the encounter.  Labs: Lab Results  Component Value Date   HGBA1C 13.1 (H) 12/02/2020   REPTSTATUS 12/11/2020 FINAL 12/04/2020   GRAMSTAIN  12/04/2020    ABUNDANT WBC PRESENT,BOTH PMN AND MONONUCLEAR RARE GRAM POSITIVE COCCI Performed at [facility], [address] [place], [place] [address]    CULT  12/04/2020    RARE ACTINOMYCES NEUII Standardized susceptibility testing for this organism is not available. RARE FINEGOLDIA MAGNA      Lab Results  Component Value Date   ALBUMIN 2.6 (L) 12/03/2020   ALBUMIN 2.9 (L) 12/02/2020    No  results found for: MG No results found for: VD25OH  No results found for: PREALBUMIN CBC EXTENDED Latest Ref Rng & Units 12/06/2020 12/05/2020 12/04/2020  WBC 4.0 - 10.5 K/uL 11.2(H) 11.1(H) 10.3  RBC 3.87 - 5.11 MIL/uL 4.24 3.80(L) 4.15  HGB 12.0 - 15.0 g/dL 10.4(L) 9.4(L) 10.1(L)  HCT 36.0 - 46.0 % 32.0(L) 29.0(L) 31.6(L)  PLT 150 - 400 K/uL 315 298 274  NEUTROABS 1.7 - 7.7 K/uL - - -  LYMPHSABS 0.7 - 4.0 K/uL - - -     There is no height or weight on file to calculate BMI.  Orders:  No orders of the defined types were placed in this encounter.  No orders of the defined types were placed in this encounter.    Procedures: No procedures performed  Clinical Data: No additional findings.  ROS:  All other systems negative, except as noted in the HPI. Review of Systems  Objective: Vital Signs: There were no vitals taken for this visit.  Specialty Comments:  No specialty comments available.  PMFS History: Patient Active Problem List   Diagnosis Date Noted   Smoking 02/01/2021   Acute hematogenous osteomyelitis of right foot (HCC) 12/22/2020   Osteomyelitis of foot, right, acute (HCC)    Dehiscence of amputation stump (HCC)    Diabetic foot infection (HCC) 12/02/2020   Essential hypertension 12/02/2020   Hyperlipidemia associated with type 2 diabetes mellitus (HCC) 08/21/2018  Bipolar affective disorder, currently depressed, moderate (HCC) 01/23/2017   Morbid obesity due to excess calories (HCC) 02/15/2016   Insulin-treated type 2 diabetes mellitus (HCC) 03/07/2013   Asthma 03/07/2013   ADHD (attention deficit hyperactivity disorder) 03/07/2013   Migraine 03/07/2013   Tobacco abuse 03/07/2013   Past Medical History:  Diagnosis Date   Anemia    Asthma    as a child   Depression    Diabetes mellitus without complication (HCC)    Type 1   Headache    migraines   High cholesterol    Hypertension     History reviewed. No pertinent family history.  Past  Surgical History:  Procedure Laterality Date   AMPUTATION Right 12/22/2020   Procedure: RIGHT TRANSMETATARSAL AMPUTATION;  Surgeon: [name], MD;  Location: [facility] OR;  Service: Orthopedics;  Laterality: Right;   AMPUTATION TOE Right 12/04/2020   Procedure: AMPUTATION RIGHT SECOND TOE, I&D FOOT;  Surgeon: Eldred Manges, MD;  Location: [facility] ORS;  Service: Orthopedics;  Laterality: Right;  Right second toe amputation, I&D right foot.   birth control implant     TONSILLECTOMY     TYMPANOSTOMY TUBE PLACEMENT     Social History   Occupational History   Not on file  Tobacco Use   Smoking status: Every Day    Packs/day: 0.25    Types: Cigarettes   Smokeless tobacco: Never  Vaping Use   Vaping Use: Never used  Substance and Sexual Activity   Alcohol use: Never   Drug use: Never   Sexual activity: Not on file

## 2021-06-08 NOTE — Progress Notes (Signed)
Office Visit Note   Patient: Valerie Le           Date of Birth: March 07, 1971           MRN: [id number] Visit Date: 05/24/2021              Requested by: Maud Deed, [place] [address] [place],  [place] [address] PCP: Maud Deed, [place]  Chief Complaint  Patient presents with   Right Foot - Wound Check, Follow-up      HPI: Patient is a 50 year old woman who presents with a plantar ulcer right foot over the lateral aspect of a transmetatarsal amputation.  Patient is about 5 months out from surgery.  She is using a nitroglycerin patch.  She has completed a course of doxycycline.  Assessment & Plan: Visit Diagnoses:  1. History of transmetatarsal amputation of right foot (HCC)   2. Non-pressure chronic ulcer of other part of right foot limited to breakdown of skin (HCC)     Plan: Dressing changes daily pressure offloading.  Follow-Up Instructions: Return in about 4 weeks (around 06/21/2021).   Ortho Exam  Patient is alert, oriented, no adenopathy, well-dressed, normal affect, normal respiratory effort. Examination patient has a stable transmetatarsal amputation on the right.  There is an ulcer laterally.  After informed consent a 10 blade knife was used to debride the skin and soft tissue back to healthy viable granulation tissue.  The ulcer was 1 cm in diameter prior to debridement and 2 cm in diameter after debridement.  There was healthy bleeding granulation tissue that was touched with silver nitrate.  Iodosorb dressing was applied.  Imaging: No results found. No images are attached to the encounter.  Labs: Lab Results  Component Value Date   HGBA1C 13.1 (H) 12/02/2020   REPTSTATUS 12/11/2020 FINAL 12/04/2020   GRAMSTAIN  12/04/2020    ABUNDANT WBC PRESENT,BOTH PMN AND MONONUCLEAR RARE GRAM POSITIVE COCCI Performed at [facility], [address], [place], [place] [address]    CULT  12/04/2020    RARE ACTINOMYCES  NEUII Standardized susceptibility testing for this organism is not available. RARE FINEGOLDIA MAGNA      Lab Results  Component Value Date   ALBUMIN 2.6 (L) 12/03/2020   ALBUMIN 2.9 (L) 12/02/2020    No results found for: MG No results found for: VD25OH  No results found for: PREALBUMIN CBC EXTENDED Latest Ref Rng & Units 12/06/2020 12/05/2020 12/04/2020  WBC 4.0 - 10.5 K/uL 11.2(H) 11.1(H) 10.3  RBC 3.87 - 5.11 MIL/uL 4.24 3.80(L) 4.15  HGB 12.0 - 15.0 g/dL 10.4(L) 9.4(L) 10.1(L)  HCT 36.0 - 46.0 % 32.0(L) 29.0(L) 31.6(L)  PLT 150 - 400 K/uL 315 298 274  NEUTROABS 1.7 - 7.7 K/uL - - -  LYMPHSABS 0.7 - 4.0 K/uL - - -     There is no height or weight on file to calculate BMI.  Orders:  No orders of the defined types were placed in this encounter.  No orders of the defined types were placed in this encounter.    Procedures: No procedures performed  Clinical Data: No additional findings.  ROS:  All other systems negative, except as noted in the HPI. Review of Systems  Objective: Vital Signs: There were no vitals taken for this visit.  Specialty Comments:  No specialty comments available.  PMFS History: Patient Active Problem List   Diagnosis Date Noted   Smoking 02/01/2021   Acute hematogenous osteomyelitis of right foot (  HCC) 12/22/2020   Osteomyelitis of foot, right, acute (HCC)    Dehiscence of amputation stump (HCC)    Diabetic foot infection (HCC) 12/02/2020   Essential hypertension 12/02/2020   Hyperlipidemia associated with type 2 diabetes mellitus (HCC) 08/21/2018   Bipolar affective disorder, currently depressed, moderate (HCC) 01/23/2017   Morbid obesity due to excess calories (HCC) 02/15/2016   Insulin-treated type 2 diabetes mellitus (HCC) 03/07/2013   Asthma 03/07/2013   ADHD (attention deficit hyperactivity disorder) 03/07/2013   Migraine 03/07/2013   Tobacco abuse 03/07/2013   Past Medical History:  Diagnosis Date   Anemia    Asthma     as a child   Depression    Diabetes mellitus without complication (HCC)    Type 1   Headache    migraines   High cholesterol    Hypertension     History reviewed. No pertinent family history.  Past Surgical History:  Procedure Laterality Date   AMPUTATION Right 12/22/2020   Procedure: RIGHT TRANSMETATARSAL AMPUTATION;  Surgeon: [name], MD;  Location: [facility] OR;  Service: Orthopedics;  Laterality: Right;   AMPUTATION TOE Right 12/04/2020   Procedure: AMPUTATION RIGHT SECOND TOE, I&D FOOT;  Surgeon: Eldred Manges, MD;  Location: [facility];  Service: Orthopedics;  Laterality: Right;  Right second toe amputation, I&D right foot.   birth control implant     TONSILLECTOMY     TYMPANOSTOMY TUBE PLACEMENT     Social History   Occupational History   Not on file  Tobacco Use   Smoking status: Every Day    Packs/day: 0.25    Types: Cigarettes   Smokeless tobacco: Never  Vaping Use   Vaping Use: Never used  Substance and Sexual Activity   Alcohol use: Never   Drug use: Never   Sexual activity: Not on file

## 2021-06-10 ENCOUNTER — Other Ambulatory Visit: Payer: Self-pay | Admitting: Family

## 2021-06-15 ENCOUNTER — Ambulatory Visit (INDEPENDENT_AMBULATORY_CARE_PROVIDER_SITE_OTHER): Payer: Managed Care, Other (non HMO) | Admitting: Infectious Diseases

## 2021-06-15 ENCOUNTER — Encounter: Payer: Self-pay | Admitting: Infectious Diseases

## 2021-06-15 ENCOUNTER — Other Ambulatory Visit: Payer: Self-pay

## 2021-06-15 ENCOUNTER — Encounter: Payer: Self-pay | Admitting: Orthopedic Surgery

## 2021-06-15 VITALS — BP 129/81 | HR 99 | Resp 16 | Ht 71.0 in | Wt 278.3 lb

## 2021-06-15 DIAGNOSIS — F17200 Nicotine dependence, unspecified, uncomplicated: Secondary | ICD-10-CM

## 2021-06-15 DIAGNOSIS — E11628 Type 2 diabetes mellitus with other skin complications: Secondary | ICD-10-CM

## 2021-06-15 DIAGNOSIS — Z5181 Encounter for therapeutic drug level monitoring: Secondary | ICD-10-CM | POA: Diagnosis not present

## 2021-06-15 DIAGNOSIS — L089 Local infection of the skin and subcutaneous tissue, unspecified: Secondary | ICD-10-CM

## 2021-06-15 DIAGNOSIS — M86171 Other acute osteomyelitis, right ankle and foot: Secondary | ICD-10-CM | POA: Diagnosis not present

## 2021-06-15 MED ORDER — DOXYCYCLINE HYCLATE 100 MG PO TABS: 100.0000 mg | ORAL_TABLET | Freq: Two times a day (BID) | ORAL | 0 refills | Status: DC

## 2021-06-15 MED ORDER — AMOXICILLIN-POT CLAVULANATE 875-125 MG PO TABS: 1.0000 | ORAL_TABLET | Freq: Two times a day (BID) | ORAL | 0 refills | Status: DC

## 2021-06-15 NOTE — Progress Notes (Addendum)
Patient Active Problem List   Diagnosis Date Noted   Smoking 02/01/2021   Acute hematogenous osteomyelitis of right foot ([place]) 12/22/2020   Osteomyelitis of foot, right, acute ([place])    Dehiscence of amputation stump ([place])    Diabetic foot infection ([place]) 12/02/2020   Essential hypertension 12/02/2020   Hyperlipidemia associated with type 2 diabetes mellitus ([place]) 08/21/2018   Bipolar affective disorder, currently depressed, moderate ([place]) 01/23/2017   Morbid obesity due to excess calories ([place]) 02/15/2016   Insulin-treated type 2 diabetes mellitus ([place]) 03/07/2013   Asthma 03/07/2013   ADHD (attention deficit hyperactivity disorder) 03/07/2013   Migraine 03/07/2013   Tobacco abuse 03/07/2013   Current Outpatient Medications on File Prior to Visit  Medication Sig Dispense Refill   amitriptyline (ELAVIL) 25 MG tablet Take 25 mg by mouth at bedtime.     amLODipine (NORVASC) 10 MG tablet Take 10 mg by mouth daily.     ammonium lactate (AMLACTIN) 12 % cream Apply 1 application topically daily as needed for dry skin (adter showers).     aspirin 81 MG EC tablet Take 81 mg by mouth daily.     atorvastatin (LIPITOR) 80 MG tablet Take 80 mg by mouth daily.     Blood Glucose Monitoring Suppl (TRUE METRIX METER) DEVI use AS DIRECTED 5 TIMES DAILY     Continuous Blood Gluc Receiver (DEXCOM G6 RECEIVER) DEVI change EVERY 90 days     Continuous Blood Gluc Sensor (DEXCOM G6 SENSOR) MISC use to check blood sugar change every 10 days     Continuous Blood Gluc Transmit (DEXCOM G6 TRANSMITTER) MISC Dispense and use as directed     D3-1000 25 MCG (1000 UT) capsule Take 1,000 Units by mouth daily.     gabapentin (NEURONTIN) 300 MG capsule Take 1 capsule (300 mg total) by mouth 3 (three) times daily. 3 times a day when necessary neuropathy pain 90 capsule 3   Glucagon (BAQSIMI ONE PACK) 3 MG/DOSE POWD Place into the nose.     ibuprofen (ADVIL,MOTRIN) 200 MG tablet Take 600 mg by mouth every 6  (six) hours as needed for mild pain.     Insulin Disposable Pump (OMNIPOD 5 G6 INTRO, GEN 5,) KIT by Does not apply route. Insulin pump     Insulin Human (INSULIN PUMP) SOLN Inject into the skin continuous. 200 units every three days     Lancet Devices (SURE COMFORT LANCING PEN) MISC use as directed 5 times daily     lisinopril (ZESTRIL) 40 MG tablet Take 40 mg by mouth daily.     metoCLOPramide (REGLAN) 10 MG tablet Take 10 mg by mouth every 6 (six) hours as needed for nausea.     ondansetron (ZOFRAN) 4 MG tablet Take 1 tablet (4 mg total) by mouth every 6 (six) hours as needed for nausea. 10 tablet 0   Prodigy Twist Top Lancets 28G MISC use as directed 5 times DAILY     sodium chloride (OCEAN) 0.65 % nasal spray Place 3 to 4 drops in each nostril every 3 to 4 hours as needed and then suction with nasal bulb or blow nose.     SUMAtriptan (IMITREX) 25 MG tablet Take 25 mg by mouth every 2 (two) hours as needed for migraine. May repeat in 2 hours if headache persists or recurs.     TOUJEO MAX SOLOSTAR 300 UNIT/ML Solostar Pen Inject 300 Units into the skin once a week.  triamcinolone (KENALOG) 4.536 % cream 1 application daily.     TRUE METRIX BLOOD GLUCOSE TEST test strip 5 (five) times daily.     TRULICITY 4.68 EH/2.1YY SOPN Inject 0.75 mg into the skin once a week.     UNIFINE PENTIPS PLUS 31G X 5 MM MISC use 1 pen needle 5 times per day     varenicline (CHANTIX PAK) 0.5 MG X 11 & 1 MG X 42 tablet Take 25 mg by mouth 2 (two) times daily.     No current facility-administered medications on file prior to visit.      Subjective: 50 Year old female with PMH of Type 1 DM s/p Rt second toe amputation and excisional debridement of dorsal rt forefoot abscess on 6/18 (Cx grew Actinomyces neuii and Finegoldia magna, s/p 2 weeks of Amoxicillin followed by TMA 7/6 with discontinuation of amoxicillin), HTN, HLD, Depression/Anxiety and Asthma who is followed up for wound dehiscence at the Rt TMA site.  She was last seen by me on mid August when RT TMA site was healing well. She has been closely following up with Orthopedics. The lateral side of the incision seem to have opened up with bleeding in mid Sept 2022. She was  then found to have wound dehiscence at that site and was prescribed a course of doxycycline on 9/27 which she seems to have completed on 10/25. She also had several minor debridements during office visits with Ortho. Xray rt foot 11/15 was concerning for Osteomyelitis. Seen by Dr Sharol Given on 12/6 and has been planned for revision of TMA site on 12/30. She says she has been restarted on doxycycline a week ago. She has been also following with Endocrinology for DM and says her BG are under control.   Denies fevers, chills and sweats. Denies nausea, vomiting, abdominal pain and diarrhea. Has on and off serous drainage from the lateral incision site.   ccompAanied by her husband today who says he is disabled.  I had a long discussion with patient and her husband who are saying that they do not want to have any surgical intervention done. They say she already had 2 surgeries done in her rt foot and the wound seems to never heal. I attempted to explain the reason for surgical intervention to remove dead and infected bones and tissues which will help in healing of the wound. However, patient does not seem to be interested and says she is the only " bread winner" of the house and cannot undergo further surgery to limit her mobility. I also told to contact Dr Jess Barters office if they have any questions regarding the planned surgery.   Review of Systems: ROS 10 point ROS done with pertinent positives and negatives as listed above   Past Medical History:  Diagnosis Date   Anemia    Asthma    as a child   Depression    Diabetes mellitus without complication ([place])    Type 1   Headache    migraines   High cholesterol    Hypertension    Past Surgical History:  Procedure Laterality Date    AMPUTATION Right 12/22/2020   Procedure: RIGHT TRANSMETATARSAL AMPUTATION;  Surgeon: Newt Minion, MD;  Location: [facility];  Service: Orthopedics;  Laterality: Right;   AMPUTATION TOE Right 12/04/2020   Procedure: AMPUTATION RIGHT SECOND TOE, I&D FOOT;  Surgeon: Marybelle Killings, MD;  Location: [facility];  Service: Orthopedics;  Laterality: Right;  Right second toe amputation, I&D right foot.  birth control implant     TONSILLECTOMY     TYMPANOSTOMY TUBE PLACEMENT      Social History   Tobacco Use   Smoking status: Every Day    Packs/day: 0.25    Types: Cigarettes   Smokeless tobacco: Never  Vaping Use   Vaping Use: Never used  Substance Use Topics   Alcohol use: Never   Drug use: Never    No family history on file.  Allergies  Allergen Reactions   Albiglutide Itching    Itching site reaction with burning, itching and pain   Coconut Oil Anaphylaxis   Pineapple Anaphylaxis   Tomato Anaphylaxis   Glimepiride Other (See Comments)    Numbness and abdominal pain   Latex Hives   Metformin And Related Other (See Comments)    Constipation/cramps   Glipizide Diarrhea    Health Maintenance  Topic Date Due   FOOT EXAM  Never done   OPHTHALMOLOGY EXAM  Never done   Hepatitis C Screening  Never done   PAP SMEAR-Modifier  Never done   COLONOSCOPY (Pts 45-29yr Insurance coverage will need to be confirmed)  Never done   Pneumococcal Vaccine [id number] old (2 - PCV) 01/23/2018   MAMMOGRAM  Never done   Zoster Vaccines- Shingrix (1 of 2) Never done   COVID-19 Vaccine (4 - Booster for Pfizer series) 08/10/2020   INFLUENZA VACCINE  01/17/2021   HEMOGLOBIN A1C  06/03/2021   TETANUS/TDAP  08/24/2022   HIV Screening  Completed   HPV VACCINES  Aged Out    Objective: BP 129/81    Pulse 99    Resp 16    Ht 5' 11"  (1.803 m)    Wt 278 lb 4.8 oz (126.2 kg)    SpO2 100%    BMI 38.81 kg/m    Physical Exam Constitutional:      Appearance: Normal appearance. Obese  HENT:     Head:  Normocephalic and atraumatic.      Mouth: Mucous membranes are moist.  Eyes:    Conjunctiva/sclera: Conjunctivae normal.     Pupils: Pupils are equal, round, and reactive to light.   Cardiovascular:     Rate and Rhythm: Normal rate and regular rhythm.     Heart sounds:   Pulmonary:     Effort: Pulmonary effort is normal.     Breath sounds: Normal breath sounds.   Abdominal:     General: Abdomen is fObese     Palpations: Abdomen is soft.   Musculoskeletal:        General: Normal range of motion.   Rt TMA site      Skin:    General: Skin is warm and dry.     Comments:  Neurological:     General: No focal deficit present.     Mental Status: She is alert and oriented to person, place, and time.   Psychiatric:        Mood and Affect: Mood normal.   Lab Results Lab Results  Component Value Date   WBC 11.2 (H) 12/06/2020   HGB 10.4 (L) 12/06/2020   HCT 32.0 (L) 12/06/2020   MCV 75.5 (L) 12/06/2020   PLT 315 12/06/2020    Lab Results  Component Value Date   CREATININE 0.44 12/09/2020   BUN 7 12/06/2020   NA 135 12/06/2020   K 4.3 12/06/2020   CL 99 12/06/2020   CO2 30 12/06/2020    Lab Results  Component Value Date   ALT  12 12/03/2020   AST 9 (L) 12/03/2020   ALKPHOS 99 12/03/2020   BILITOT 0.3 12/03/2020    No results found for: CHOL, HDL, LDLCALC, LDLDIRECT, TRIG, CHOLHDL No results found for: LABRPR, RPRTITER No results found for: HIV1RNAQUANT, HIV1RNAVL, CD4TABS   Problem List Items Addressed This Visit       Endocrine   Diabetic foot infection ([place]) - Primary   Relevant Orders   Comprehensive metabolic panel   CBC   C-reactive protein   Sedimentation rate     Musculoskeletal and Integument   Osteomyelitis of foot, right, acute (HCC)     Other   Smoking    Assessment/Plan Rt TMA site wound dehiscence/Ulcer Osteomyelitis of rt distal 5th metatarsal DM Smoking- counseled  Patient refused to me about undergoing revision of RT TMA site  scheduled on 12/30. She is already on Doxycycline for a week. I will add Augmentin to cover previously isolated organisms ( Actinomyces neuii and Finegoldia magna) given she is reluctant to have the revision surgery  Baseline labs CBC, CMP, ESR and CRP Fu in a week  In case she ends up going for RT TMA site revision, recommend to send tissue/bone specimen for path and aerobic/anaerobic cultures to guide targeted antibiotic therapy. If she ends up not having surgery, will plan for IV antibiotics instead of PO next week.   I have personally spent 40  minutes involved in face-to-face and non-face-to-face activities for this patient on the day of the visit. Professional time spent includes the following activities: Preparing to see the patient (review of tests), Obtaining and/or reviewing separately obtained history (admission/discharge record), Performing a medically appropriate examination and/or evaluation , Ordering medications/tests/procedures, referring and communicating with other health care professionals, Documenting clinical information in the EMR, Independently interpreting results (not separately reported), Communicating results to the patient/family/caregiver, Counseling and educating the patient/family/caregiver and Care coordination (not separately reported).   Wilber Oliphant, [facility] 06/15/2021, 4:01 PM

## 2021-06-16 ENCOUNTER — Telehealth: Payer: Self-pay | Admitting: Orthopedic Surgery

## 2021-06-16 LAB — COMPREHENSIVE METABOLIC PANEL
AG Ratio: 0.9 (calc) — ABNORMAL LOW (ref 1.0–2.5)
ALT: 11 U/L (ref 6–29)
AST: 10 U/L (ref 10–35)
Albumin: 3.4 g/dL — ABNORMAL LOW (ref 3.6–5.1)
Alkaline phosphatase (APISO): 116 U/L (ref 37–153)
BUN: 18 mg/dL (ref 7–25)
CO2: 25 mmol/L (ref 20–32)
Calcium: 8.8 mg/dL (ref 8.6–10.4)
Chloride: 100 mmol/L (ref 98–110)
Creat: 0.8 mg/dL (ref 0.50–1.03)
Globulin: 4 g/dL (calc) — ABNORMAL HIGH (ref 1.9–3.7)
Glucose, Bld: 216 mg/dL — ABNORMAL HIGH (ref 65–99)
Potassium: 4.4 mmol/L (ref 3.5–5.3)
Sodium: 135 mmol/L (ref 135–146)
Total Bilirubin: 0.5 mg/dL (ref 0.2–1.2)
Total Protein: 7.4 g/dL (ref 6.1–8.1)

## 2021-06-16 LAB — CBC
HCT: 33.4 % — ABNORMAL LOW (ref 35.0–45.0)
Hemoglobin: 10.8 g/dL — ABNORMAL LOW (ref 11.7–15.5)
MCH: 23.9 pg — ABNORMAL LOW (ref 27.0–33.0)
MCHC: 32.3 g/dL (ref 32.0–36.0)
MCV: 73.9 fL — ABNORMAL LOW (ref 80.0–100.0)
MPV: 11 fL (ref 7.5–12.5)
Platelets: 399 10*3/uL (ref 140–400)
RBC: 4.52 10*6/uL (ref 3.80–5.10)
RDW: 16.3 % — ABNORMAL HIGH (ref 11.0–15.0)
WBC: 11.7 10*3/uL — ABNORMAL HIGH (ref 3.8–10.8)

## 2021-06-16 LAB — C-REACTIVE PROTEIN: CRP: 24.7 mg/L — ABNORMAL HIGH (ref <8.0)

## 2021-06-16 LAB — SEDIMENTATION RATE: Sed Rate: 72 mm/h — ABNORMAL HIGH (ref 0–30)

## 2021-06-16 NOTE — Progress Notes (Signed)
Was talking with pt on the phone for her pre-op call. She got a call from Dr. Audrie Lia office and asked that I hold. When she came back to our conversation she stated that she was not going to have surgery tomorrow and will discuss other options with Dr. Lajoyce Corners on 06/21/20 at her next appt.

## 2021-06-16 NOTE — Telephone Encounter (Signed)
Pt called has a lot of questions about her surgery tomorrow. She wants to know how long she will be out after surgery? How long will surgery take? What is recovery time? How much of her foot is coming off? Will she have another wound vac? She saw ID yesterday and they put her on another abx on top of the doxycycline she is currently taking. Want to see her back in one week which will be right after her post op appt with Dr. Lajoyce Corners.  She also had the question of if she can push back on this surgery and for how long? Per Dr. Lajoyce Corners the sooner the better, not emergency, it is her call.

## 2021-06-16 NOTE — Telephone Encounter (Signed)
Pt wants answers on the questions before she makes a decision about surgery tomorrow.

## 2021-06-16 NOTE — Telephone Encounter (Signed)
Pt was given answers to her questions. She does want to cancel surgery tomorrow. She has an appt here with Dr. Lajoyce Corners on 06/21/21 and she will then access and reschedule at that time. She says there is no upper management at work to get approval on time off at this time.

## 2021-06-17 ENCOUNTER — Ambulatory Visit (HOSPITAL_COMMUNITY)
Admission: RE | Admit: 2021-06-17 | Payer: Managed Care, Other (non HMO) | Source: Home / Self Care | Admitting: Orthopedic Surgery

## 2021-06-17 ENCOUNTER — Other Ambulatory Visit: Payer: Self-pay

## 2021-06-17 ENCOUNTER — Encounter (HOSPITAL_COMMUNITY): Admission: RE | Payer: Self-pay | Source: Home / Self Care

## 2021-06-17 SURGERY — REVISION, AMPUTATION SITE
Anesthesia: Choice | Laterality: Right

## 2021-06-17 MED ORDER — GABAPENTIN 300 MG PO CAPS: 300.0000 mg | ORAL_CAPSULE | Freq: Three times a day (TID) | ORAL | 3 refills | Status: DC

## 2021-06-21 ENCOUNTER — Encounter: Payer: Self-pay | Admitting: Orthopedic Surgery

## 2021-06-21 ENCOUNTER — Ambulatory Visit (INDEPENDENT_AMBULATORY_CARE_PROVIDER_SITE_OTHER): Payer: Managed Care, Other (non HMO) | Admitting: Orthopedic Surgery

## 2021-06-21 DIAGNOSIS — M86271 Subacute osteomyelitis, right ankle and foot: Secondary | ICD-10-CM

## 2021-06-21 NOTE — Progress Notes (Signed)
Office Visit Note   Patient: Valerie Le           Date of Birth: 1971-06-09           MRN: [id number] Visit Date: 06/21/2021              Requested by: Willeen Niece, [place] [place],  [place] [id number] PCP: Willeen Niece, [place]  Chief Complaint  Patient presents with   Right Foot - Follow-up      HPI: Patient is a 51 year old woman who is seen in follow-up for a transmetatarsal amputation on the right.  Patient has had progressive ulceration over the fifth metatarsal.  She is currently on doxycycline and Augmentin and has an infectious disease appointment tomorrow.  Assessment & Plan: Visit Diagnoses:  1. Subacute osteomyelitis of right foot ([place])     Plan: Discussed with the patient and her sister on the phone recommendations to proceed with debridement of the infected bone sending this for cultures placement of a incisional wound VAC and the importance of nonweightbearing.  Discussed the patient will need a shoe with double upright braces and that we will place her on an antibiotic after tissue cultures are obtained.  Recommended omega-3 2000 mg a day vitamin D3 5000 international units a day as well as her protein supplement 30 g a day.  Recommended proceeding with surgery as soon as possible on Friday.  Discussed risks of waiting.  Patient agrees to proceed with surgery at this time.  Follow-Up Instructions: Return in about 1 week (around 06/28/2021).   Ortho Exam  Patient is alert, oriented, no adenopathy, well-dressed, normal affect, normal respiratory effort. Examination patient has a palpable dorsalis pedis and posterior tibial pulse.  The previous incision is well-healed there is no redness no cellulitis there is some swelling.  The ulcer is 1 cm diameter and probes to bone.  There is no purulence there is serosanguineous drainage no ascending cellulitis the calf is soft nontender.  Imaging: No results found. No images are  attached to the encounter.  Labs: Lab Results  Component Value Date   HGBA1C 13.1 (H) 12/02/2020   ESRSEDRATE 72 (H) 06/15/2021   CRP 24.7 (H) 06/15/2021   REPTSTATUS 12/11/2020 FINAL 12/04/2020   GRAMSTAIN  12/04/2020    ABUNDANT WBC PRESENT,BOTH PMN AND MONONUCLEAR RARE GRAM POSITIVE COCCI Performed at [facility], [address] [place], [place] [address]    CULT  12/04/2020    RARE ACTINOMYCES NEUII Standardized susceptibility testing for this organism is not available. RARE FINEGOLDIA MAGNA      Lab Results  Component Value Date   ALBUMIN 2.6 (L) 12/03/2020   ALBUMIN 2.9 (L) 12/02/2020    No results found for: MG No results found for: VD25OH  No results found for: PREALBUMIN CBC EXTENDED Latest Ref Rng & Units 06/15/2021 12/06/2020 12/05/2020  WBC 3.8 - 10.8 Thousand/uL 11.7(H) 11.2(H) 11.1(H)  RBC 3.80 - 5.10 Million/uL 4.52 4.24 3.80(L)  HGB 11.7 - 15.5 g/dL 10.8(L) 10.4(L) 9.4(L)  HCT 35.0 - 45.0 % 33.4(L) 32.0(L) 29.0(L)  PLT 140 - 400 Thousand/uL 399 315 298  NEUTROABS 1.7 - 7.7 K/uL - - -  LYMPHSABS 0.7 - 4.0 K/uL - - -     There is no height or weight on file to calculate BMI.  Orders:  No orders of the defined types were placed in this encounter.  No orders of the defined types were placed in this encounter.  Procedures: No procedures performed  Clinical Data: No additional findings.  ROS:  All other systems negative, except as noted in the HPI. Review of Systems  Objective: Vital Signs: There were no vitals taken for this visit.  Specialty Comments:  No specialty comments available.  PMFS History: Patient Active Problem List   Diagnosis Date Noted   Medication monitoring encounter 06/15/2021   Smoking 02/01/2021   Acute hematogenous osteomyelitis of right foot ([place]) 12/22/2020   Osteomyelitis of foot, right, acute ([place])    Dehiscence of amputation stump ([place])    Diabetic foot infection ([place]) 12/02/2020   Essential  hypertension 12/02/2020   Hyperlipidemia associated with type 2 diabetes mellitus ([place]) 08/21/2018   Bipolar affective disorder, currently depressed, moderate ([place]) 01/23/2017   Morbid obesity due to excess calories ([place]) 02/15/2016   Insulin-treated type 2 diabetes mellitus ([place]) 03/07/2013   Asthma 03/07/2013   ADHD (attention deficit hyperactivity disorder) 03/07/2013   Migraine 03/07/2013   Tobacco abuse 03/07/2013   Past Medical History:  Diagnosis Date   Anemia    Asthma    as a child   Depression    Diabetes mellitus without complication (HCC)    Type 1   Headache    migraines   High cholesterol    Hypertension     History reviewed. No pertinent family history.  Past Surgical History:  Procedure Laterality Date   AMPUTATION Right 12/22/2020   Procedure: RIGHT TRANSMETATARSAL AMPUTATION;  Surgeon: Newt Minion, MD;  Location: [facility];  Service: Orthopedics;  Laterality: Right;   AMPUTATION TOE Right 12/04/2020   Procedure: AMPUTATION RIGHT SECOND TOE, I&D FOOT;  Surgeon: Marybelle Killings, MD;  Location: [facility];  Service: Orthopedics;  Laterality: Right;  Right second toe amputation, I&D right foot.   birth control implant     TONSILLECTOMY     TYMPANOSTOMY TUBE PLACEMENT     Social History   Occupational History   Not on file  Tobacco Use   Smoking status: Every Day    Packs/day: 0.25    Types: Cigarettes   Smokeless tobacco: Never   Tobacco comments:    Cut down on smoking while on Chantix-  Vaping Use   Vaping Use: Never used  Substance and Sexual Activity   Alcohol use: Never   Drug use: Never   Sexual activity: Not on file

## 2021-06-22 ENCOUNTER — Encounter: Payer: Self-pay | Admitting: Infectious Diseases

## 2021-06-22 ENCOUNTER — Ambulatory Visit (INDEPENDENT_AMBULATORY_CARE_PROVIDER_SITE_OTHER): Payer: Managed Care, Other (non HMO) | Admitting: Infectious Diseases

## 2021-06-22 ENCOUNTER — Other Ambulatory Visit: Payer: Self-pay

## 2021-06-22 VITALS — BP 150/90 | HR 91 | Temp 97.9°F | Wt 276.0 lb

## 2021-06-22 DIAGNOSIS — Z5181 Encounter for therapeutic drug level monitoring: Secondary | ICD-10-CM | POA: Diagnosis not present

## 2021-06-22 DIAGNOSIS — M86171 Other acute osteomyelitis, right ankle and foot: Secondary | ICD-10-CM | POA: Diagnosis not present

## 2021-06-22 DIAGNOSIS — T8781 Dehiscence of amputation stump: Secondary | ICD-10-CM | POA: Diagnosis not present

## 2021-06-22 NOTE — Progress Notes (Signed)
Patient Active Problem List   Diagnosis Date Noted   Medication monitoring encounter 06/15/2021   Smoking 02/01/2021   Acute hematogenous osteomyelitis of right foot ([place]) 12/22/2020   Osteomyelitis of foot, right, acute ([place])    Dehiscence of amputation stump ([place])    Diabetic foot infection ([place]) 12/02/2020   Essential hypertension 12/02/2020   Hyperlipidemia associated with type 2 diabetes mellitus ([place]) 08/21/2018   Bipolar affective disorder, currently depressed, moderate ([place]) 01/23/2017   Morbid obesity due to excess calories ([place]) 02/15/2016   Insulin-treated type 2 diabetes mellitus ([place]) 03/07/2013   Asthma 03/07/2013   ADHD (attention deficit hyperactivity disorder) 03/07/2013   Migraine 03/07/2013   Tobacco abuse 03/07/2013   Current Outpatient Medications on File Prior to Visit  Medication Sig Dispense Refill   amitriptyline (ELAVIL) 25 MG tablet Take 25 mg by mouth at bedtime.     amLODipine (NORVASC) 10 MG tablet Take 10 mg by mouth daily.     ammonium lactate (AMLACTIN) 12 % cream Apply 1 application topically daily as needed for dry skin (adter showers).     amoxicillin-clavulanate (AUGMENTIN) 875-125 MG tablet Take 1 tablet by mouth 2 (two) times daily. 30 tablet 0   aspirin 81 MG EC tablet Take 81 mg by mouth daily.     atorvastatin (LIPITOR) 80 MG tablet Take 80 mg by mouth daily.     Blood Glucose Monitoring Suppl (TRUE METRIX METER) DEVI use AS DIRECTED 5 TIMES DAILY     Continuous Blood Gluc Receiver (DEXCOM G6 RECEIVER) DEVI change EVERY 90 days     Continuous Blood Gluc Sensor (DEXCOM G6 SENSOR) MISC use to check blood sugar change every 10 days     Continuous Blood Gluc Transmit (DEXCOM G6 TRANSMITTER) MISC Dispense and use as directed     D3-1000 25 MCG (1000 UT) capsule Take 1,000 Units by mouth daily.     doxycycline (VIBRA-TABS) 100 MG tablet Take 1 tablet (100 mg total) by mouth 2 (two) times daily. 30 tablet 0   gabapentin (NEURONTIN) 300 MG  capsule Take 1 capsule (300 mg total) by mouth 3 (three) times daily. 3 times a day when necessary neuropathy pain 90 capsule 3   Glucagon (BAQSIMI ONE PACK) 3 MG/DOSE POWD Place into the nose.     ibuprofen (ADVIL,MOTRIN) 200 MG tablet Take 600 mg by mouth every 6 (six) hours as needed for mild pain.     Insulin Disposable Pump (OMNIPOD 5 G6 INTRO, GEN 5,) KIT by Does not apply route. Insulin pump     Insulin Human (INSULIN PUMP) SOLN Inject into the skin continuous. 200 units every three days     Lancet Devices (SURE COMFORT LANCING PEN) MISC use as directed 5 times daily     lisinopril (ZESTRIL) 40 MG tablet Take 40 mg by mouth daily.     metoCLOPramide (REGLAN) 10 MG tablet Take 10 mg by mouth every 6 (six) hours as needed for nausea.     ondansetron (ZOFRAN) 4 MG tablet Take 1 tablet (4 mg total) by mouth every 6 (six) hours as needed for nausea. 10 tablet 0   Prodigy Twist Top Lancets 28G MISC use as directed 5 times DAILY     sodium chloride (OCEAN) 0.65 % nasal spray Place 3 to 4 drops in each nostril every 3 to 4 hours as needed and then suction with nasal bulb or blow nose.     SUMAtriptan (IMITREX) 25 MG tablet Take 25  mg by mouth every 2 (two) hours as needed for migraine. May repeat in 2 hours if headache persists or recurs.     TOUJEO MAX SOLOSTAR 300 UNIT/ML Solostar Pen Inject 300 Units into the skin once a week.     triamcinolone (KENALOG) 6.754 % cream 1 application daily.     TRUE METRIX BLOOD GLUCOSE TEST test strip 5 (five) times daily.     TRULICITY 4.92 EF/0.0FH SOPN Inject 0.75 mg into the skin once a week.     UNIFINE PENTIPS PLUS 31G X 5 MM MISC use 1 pen needle 5 times per day     varenicline (CHANTIX PAK) 0.5 MG X 11 & 1 MG X 42 tablet Take 25 mg by mouth 2 (two) times daily.     No current facility-administered medications on file prior to visit.    Subjective: 51 Year old female with PMH of Type 1 DM s/p Rt second toe amputation and excisional debridement of  dorsal rt forefoot abscess on 6/18 (Cx grew Actinomyces neuii and Finegoldia magna, s/p 2 weeks of Amoxicillin followed by TMA 7/6 with discontinuation of amoxicillin), HTN, HLD, Depression/Anxiety and Asthma who is followed up for wound dehiscence at the Rt TMA site. She was last seen by me on mid August when RT TMA site was healing well. She has been closely following up with Orthopedics. The lateral side of the incision seem to have opened up with bleeding in mid Sept 2022. She was  then found to have wound dehiscence at that site and was prescribed a course of doxycycline on 9/27 which she seems to have completed on 10/25. She also had several minor debridements during office visits with Ortho. Xray rt foot 11/15 was concerning for Osteomyelitis. Seen by Dr Sharol Given on 12/6 and has been planned for revision of TMA site on 12/30. She says she has been restarted on doxycycline a week ago. She has been also following with Endocrinology for DM and says her BG are under control.   Denies fevers, chills and sweats. Denies nausea, vomiting, abdominal pain and diarrhea. Has on and off serous drainage from the lateral incision site.   ccompAanied by her husband today who says he is disabled.  I had a long discussion with patient and her husband who are saying that they do not want to have any surgical intervention done. They say she already had 2 surgeries done in her rt foot and the wound seems to never heal. I attempted to explain the reason for surgical intervention to remove dead and infected bones and tissues which will help in healing of the wound. However, patient does not seem to be interested and says she is the only " bread winner" of the house and cannot undergo further surgery to limit her mobility. I also told to contact Dr Jess Barters office if they have any questions regarding the planned surgery.   06/22/21 After last seen, patient did not underwent revision of TMA site. She started taking Augmentin in  addition to Doxycyline. Denies any issues like nausea, vomiting or diarrhea with the antibiotics. Seen by Dr Sharol Given last on 1/3 where she agreed to revision surgery and she told me she is going for revision surgery this Friday. Denies fevers, chills, sweats. Discussed with her to hold the antibiotics until her  planned revision surgery to help with cultures and targeted antibiotic therapy.   Review of Systems: ROS 10 point ROS done with pertinent positives and negatives as listed above  Past Medical History:  Diagnosis Date   Anemia    Asthma    as a child   Depression    Diabetes mellitus without complication ([place])    Type 1   Headache    migraines   High cholesterol    Hypertension    Past Surgical History:  Procedure Laterality Date   AMPUTATION Right 12/22/2020   Procedure: RIGHT TRANSMETATARSAL AMPUTATION;  Surgeon: Newt Minion, MD;  Location: [facility];  Service: Orthopedics;  Laterality: Right;   AMPUTATION TOE Right 12/04/2020   Procedure: AMPUTATION RIGHT SECOND TOE, I&D FOOT;  Surgeon: Marybelle Killings, MD;  Location: [facility];  Service: Orthopedics;  Laterality: Right;  Right second toe amputation, I&D right foot.   birth control implant     TONSILLECTOMY     TYMPANOSTOMY TUBE PLACEMENT      Social History   Tobacco Use   Smoking status: Every Day    Packs/day: 0.25    Types: Cigarettes   Smokeless tobacco: Never   Tobacco comments:    Cut down on smoking while on Chantix-  Vaping Use   Vaping Use: Never used  Substance Use Topics   Alcohol use: Never   Drug use: Never    No family history on file.  Allergies  Allergen Reactions   Albiglutide Itching    Itching site reaction with burning, itching and pain   Coconut Oil Anaphylaxis   Pineapple Anaphylaxis   Tomato Anaphylaxis   Glimepiride Other (See Comments)    Numbness and abdominal pain   Latex Hives   Metformin And Related Other (See Comments)    Constipation/cramps   Glipizide Diarrhea    Health  Maintenance  Topic Date Due   FOOT EXAM  Never done   OPHTHALMOLOGY EXAM  Never done   Hepatitis C Screening  Never done   PAP SMEAR-Modifier  Never done   COLONOSCOPY (Pts 45-59yr Insurance coverage will need to be confirmed)  Never done   Pneumococcal Vaccine [id number] old (2 - PCV) 01/23/2018   MAMMOGRAM  Never done   Zoster Vaccines- Shingrix (1 of 2) Never done   COVID-19 Vaccine (4 - Booster for Pfizer series) 08/10/2020   INFLUENZA VACCINE  01/17/2021   HEMOGLOBIN A1C  06/03/2021   TETANUS/TDAP  08/24/2022   HIV Screening  Completed   HPV VACCINES  Aged Out    Objective: BP (!) 150/90    Pulse 91    Temp 97.9 F (36.6 C) (Oral)    Wt 276 lb (125.2 kg)    SpO2 100%    BMI 38.49 kg/m     Physical Exam Constitutional:      Appearance: Normal appearance. Obese  HENT:     Head: Normocephalic and atraumatic.      Mouth: Mucous membranes are moist.  Eyes:    Conjunctiva/sclera: Conjunctivae normal.     Pupils: Pupils are equal, round, and reactive to light.   Cardiovascular:     Rate and Rhythm: Normal rate and regular rhythm.     Heart sounds:   Pulmonary:     Effort: Pulmonary effort is normal.     Breath sounds: Normal breath sounds.   Abdominal:     General: Abdomen is fObese     Palpations: Abdomen is soft.   Musculoskeletal:        General: Normal range of motion.   Rt TMA site     Skin:    General: Skin is warm and dry.  Comments:  Neurological:     General: No focal deficit present.     Mental Status: She is alert and oriented to person, place, and time.   Psychiatric:        Mood and Affect: Mood normal.   Lab Results Lab Results  Component Value Date   WBC 11.7 (H) 06/15/2021   HGB 10.8 (L) 06/15/2021   HCT 33.4 (L) 06/15/2021   MCV 73.9 (L) 06/15/2021   PLT 399 06/15/2021    Lab Results  Component Value Date   CREATININE 0.80 06/15/2021   BUN 18 06/15/2021   NA 135 06/15/2021   K 4.4 06/15/2021   CL 100 06/15/2021   CO2  25 06/15/2021    Lab Results  Component Value Date   ALT 11 06/15/2021   AST 10 06/15/2021   ALKPHOS 99 12/03/2020   BILITOT 0.5 06/15/2021    No results found for: CHOL, HDL, LDLCALC, LDLDIRECT, TRIG, CHOLHDL No results found for: LABRPR, RPRTITER No results found for: HIV1RNAQUANT, HIV1RNAVL, CD4TABS  Problem List Items Addressed This Visit       Musculoskeletal and Integument   Osteomyelitis of foot, right, acute ([place]) - Primary     Other   Dehiscence of amputation stump ([place])   Medication monitoring encounter   Assessment/Plan Rt TMA site wound dehiscence/Ulcer Osteomyelitis of rt distal 5th metatarsal DM Smoking- counseled  As patient is going for revision surgery of the RT TMA site this Friday, I have instructed her to hold off on antibiotics to help with the cultures and targeted antibiotic therapy. She does not have signs of cellulitis in her rt foot on exam today nor concerns of sepsis. I have discussed with her signs and symptoms of sepsis in which case she needs to go to the hospital.   I will also have her seen by my partners post surgery to guide antibiotic management depending on surgery done.   Fu to be made post hospital admission  I have personally spent 30  minutes involved in face-to-face and non-face-to-face activities for this patient on the day of the visit. Professional time spent includes the following activities: Preparing to see the patient (review of tests), Obtaining and/or reviewing separately obtained history (admission/discharge record), Performing a medically appropriate examination and/or evaluation , Ordering medications/tests/procedures, referring and communicating with other health care professionals, Documenting clinical information in the EMR, Independently interpreting results (not separately reported), Communicating results to the patient/family/caregiver, Counseling and educating the patient/family/caregiver and Care coordination (not  separately reported).   Wilber Oliphant, [facility] 06/22/2021, 9:01 AM

## 2021-06-23 ENCOUNTER — Telehealth: Payer: Self-pay | Admitting: Orthopedic Surgery

## 2021-06-23 NOTE — Telephone Encounter (Signed)
Patient requesting a work note with the expected time out to be faxed over to her job.  Fax#: [phone]

## 2021-06-23 NOTE — Telephone Encounter (Signed)
This pt did not have surgery I di dnot think that she was still out of work? Can you call and find out what she is needing please?

## 2021-06-24 ENCOUNTER — Telehealth: Payer: Self-pay | Admitting: Infectious Disease

## 2021-06-24 ENCOUNTER — Telehealth: Payer: Self-pay | Admitting: Orthopedic Surgery

## 2021-06-24 NOTE — Telephone Encounter (Signed)
Patient's sister Angelique Blonder called asked what kind of protein drink did Dr Lajoyce Corners tell her to get for her sister? Denise asked when calling back can the drink be spelled out for her so that she can get the right one. The number to contact Angelique Blonder is  [phone]

## 2021-06-24 NOTE — Telephone Encounter (Signed)
Patient apparently not having surgery now  I am ccing to Dr. Alen Blew as antibiotics were being held in anticipation of surgery.

## 2021-06-27 ENCOUNTER — Other Ambulatory Visit: Payer: Self-pay

## 2021-06-27 ENCOUNTER — Encounter (HOSPITAL_COMMUNITY): Payer: Self-pay | Admitting: Orthopedic Surgery

## 2021-06-27 ENCOUNTER — Telehealth: Payer: Self-pay | Admitting: Orthopedic Surgery

## 2021-06-27 NOTE — Telephone Encounter (Signed)
I called and advised that Dr. Sharol Given likes [facility]  protein powder and that L arginine in the powder or she can use OTC protein ready made shake which ever she would like to use. Will call with any questions.

## 2021-06-27 NOTE — Progress Notes (Signed)
DUE TO COVID-19 ONLY ONE VISITOR IS ALLOWED TO COME WITH YOU AND STAY IN THE WAITING ROOM ONLY DURING PRE OP AND PROCEDURE DAY OF SURGERY.   PCP - Maud Deed, PA Cardiologist - n/a Endocrinology - Dr Crista Curb  Chest x-ray - n/a EKG - 12/22/20 Stress Test - n/a ECHO - n/a Cardiac Cath - n/a  ICD Pacemaker/Loop - n/a  Sleep Study -  n/a CPAP - none  Diabetes: Human Insulin Pump reduce basal rate by 20% at midnight (Tues).  Patient has a Dexcom G6 System located at Left Upper ABD.  Inject 19 units of Toujeo tonight.   If your blood sugar is less than 70 mg/dL, you will need to treat for low blood sugar: Treat a low blood sugar (less than 70 mg/dL) with  cup of clear juice (cranberry or apple), 4 glucose tablets, OR glucose gel. Recheck blood sugar in 15 minutes after treatment (to make sure it is greater than 70 mg/dL). If your blood sugar is not greater than 70 mg/dL on recheck, call [phone] for further instructions.  Aspirin Instructions: Follow your surgeon's instructions on when to stop aspirin prior to surgery,  If no instructions were given by your surgeon then you will need to call the office for those instructions.  ERAS: Clear liquids til 6:30 AM on DOS.  Anesthesia review: Yes  STOP now taking any Aspirin (unless otherwise instructed by your surgeon), Aleve, Naproxen, Ibuprofen, Motrin, Advil, Goody's, BC's, all herbal medications, fish oil, and all vitamins.   Coronavirus Screening Covid test n/a Ambulatory Surgery  Do you have any of the following symptoms:  Cough yes/no: No Fever (>100.16F)  yes/no: No Runny nose yes/no: No Sore throat yes/no: No Difficulty breathing/shortness of breath  yes/no: No  Have you traveled in the last 14 days and where? yes/no: No  Patient verbalized understanding of instructions that were given via phone.

## 2021-06-27 NOTE — Telephone Encounter (Signed)
This was done this morning  

## 2021-06-27 NOTE — Telephone Encounter (Signed)
Note faxed pt scheduled to have a rev transmet amputation on 06/29/21 letter faxed to advise estimate out of time to be 4-6 weeks depending on healing.

## 2021-06-27 NOTE — Telephone Encounter (Signed)
Pt called and would like an Out of work note to be out due to surgery.   CB [phone]

## 2021-06-28 ENCOUNTER — Encounter (HOSPITAL_COMMUNITY): Payer: Self-pay | Admitting: Orthopedic Surgery

## 2021-06-28 ENCOUNTER — Telehealth: Payer: Self-pay | Admitting: Orthopedic Surgery

## 2021-06-28 NOTE — Telephone Encounter (Signed)
Called pt and advised that I faxed the letter yesterday. Confirmed fax number and re faxed again today. Pt will call with any questions or if she needs anything else.

## 2021-06-28 NOTE — Telephone Encounter (Signed)
Pt called requesting a note for work she is having surgery tomorrow. Please call pt about this matter at [phone].

## 2021-06-28 NOTE — Anesthesia Preprocedure Evaluation (Addendum)
Anesthesia Evaluation  Patient identified by MRN, date of birth, ID band Patient awake    Reviewed: Allergy & Precautions, NPO status , Patient's Chart, lab work & pertinent test results  Airway Mallampati: II  TM Distance: >3 FB Neck ROM: Full    Dental  (+) Chipped, Dental Advisory Given, Teeth Intact,    Pulmonary asthma , Current Smoker and Patient abstained from smoking.,    Pulmonary exam normal breath sounds clear to auscultation       Cardiovascular hypertension, Pt. on medications Normal cardiovascular exam Rhythm:Regular Rate:Normal     Neuro/Psych  Headaches, PSYCHIATRIC DISORDERS Depression Bipolar Disorder    GI/Hepatic negative GI ROS, Neg liver ROS,   Endo/Other  diabetes, Poorly Controlled, Type 1, Insulin Dependent  Renal/GU negative Renal ROS  negative genitourinary   Musculoskeletal negative musculoskeletal ROS (+)   Abdominal (+) + obese,   Peds  Hematology  (+) Blood dyscrasia (Hgb 10.4), anemia ,   Anesthesia Other Findings   Reproductive/Obstetrics                            Anesthesia Physical  Anesthesia Plan  ASA: 3  Anesthesia Plan: General   Post-op Pain Management: Tylenol PO (pre-op)   Induction: Intravenous  PONV Risk Score and Plan: 2 and Ondansetron, Midazolam, Treatment may vary due to age or medical condition and Diphenhydramine  Airway Management Planned: LMA  Additional Equipment:   Intra-op Plan:   Post-operative Plan: Extubation in OR  Informed Consent: I have reviewed the patients History and Physical, chart, labs and discussed the procedure including the risks, benefits and alternatives for the proposed anesthesia with the patient or authorized representative who has indicated his/her understanding and acceptance.     Dental advisory given  Plan Discussed with: CRNA  Anesthesia Plan Comments:       Anesthesia Quick  Evaluation

## 2021-06-29 ENCOUNTER — Ambulatory Visit (HOSPITAL_COMMUNITY)
Admission: RE | Admit: 2021-06-29 | Discharge: 2021-06-29 | Disposition: A | Discharge status: Home / Self Care | Payer: Managed Care, Other (non HMO) | Source: Ambulatory Visit | Attending: Orthopedic Surgery | Admitting: Orthopedic Surgery

## 2021-06-29 ENCOUNTER — Ambulatory Visit (HOSPITAL_COMMUNITY): Payer: Managed Care, Other (non HMO) | Admitting: Physician Assistant

## 2021-06-29 ENCOUNTER — Encounter (HOSPITAL_COMMUNITY)
Admission: RE | Disposition: A | Discharge status: Home / Self Care | Payer: Self-pay | Source: Ambulatory Visit | Attending: Orthopedic Surgery

## 2021-06-29 ENCOUNTER — Other Ambulatory Visit: Payer: Self-pay

## 2021-06-29 ENCOUNTER — Ambulatory Visit: Payer: Managed Care, Other (non HMO) | Admitting: Infectious Diseases

## 2021-06-29 ENCOUNTER — Encounter (HOSPITAL_COMMUNITY): Payer: Self-pay | Admitting: Orthopedic Surgery

## 2021-06-29 DIAGNOSIS — J45909 Unspecified asthma, uncomplicated: Secondary | ICD-10-CM | POA: Diagnosis not present

## 2021-06-29 DIAGNOSIS — T8789 Other complications of amputation stump: Secondary | ICD-10-CM | POA: Diagnosis not present

## 2021-06-29 DIAGNOSIS — L97519 Non-pressure chronic ulcer of other part of right foot with unspecified severity: Secondary | ICD-10-CM | POA: Insufficient documentation

## 2021-06-29 DIAGNOSIS — R519 Headache, unspecified: Secondary | ICD-10-CM | POA: Diagnosis not present

## 2021-06-29 DIAGNOSIS — E1065 Type 1 diabetes mellitus with hyperglycemia: Secondary | ICD-10-CM | POA: Insufficient documentation

## 2021-06-29 DIAGNOSIS — E1069 Type 1 diabetes mellitus with other specified complication: Secondary | ICD-10-CM | POA: Diagnosis not present

## 2021-06-29 DIAGNOSIS — E10621 Type 1 diabetes mellitus with foot ulcer: Secondary | ICD-10-CM | POA: Insufficient documentation

## 2021-06-29 DIAGNOSIS — D759 Disease of blood and blood-forming organs, unspecified: Secondary | ICD-10-CM | POA: Insufficient documentation

## 2021-06-29 DIAGNOSIS — Z6836 Body mass index (BMI) 36.0-36.9, adult: Secondary | ICD-10-CM | POA: Diagnosis not present

## 2021-06-29 DIAGNOSIS — M86071 Acute hematogenous osteomyelitis, right ankle and foot: Secondary | ICD-10-CM

## 2021-06-29 DIAGNOSIS — F17210 Nicotine dependence, cigarettes, uncomplicated: Secondary | ICD-10-CM | POA: Insufficient documentation

## 2021-06-29 DIAGNOSIS — E669 Obesity, unspecified: Secondary | ICD-10-CM | POA: Diagnosis not present

## 2021-06-29 DIAGNOSIS — M86171 Other acute osteomyelitis, right ankle and foot: Secondary | ICD-10-CM | POA: Diagnosis not present

## 2021-06-29 DIAGNOSIS — I10 Essential (primary) hypertension: Secondary | ICD-10-CM | POA: Insufficient documentation

## 2021-06-29 DIAGNOSIS — D649 Anemia, unspecified: Secondary | ICD-10-CM | POA: Diagnosis not present

## 2021-06-29 DIAGNOSIS — M869 Osteomyelitis, unspecified: Secondary | ICD-10-CM | POA: Insufficient documentation

## 2021-06-29 DIAGNOSIS — Z794 Long term (current) use of insulin: Secondary | ICD-10-CM | POA: Diagnosis not present

## 2021-06-29 DIAGNOSIS — F319 Bipolar disorder, unspecified: Secondary | ICD-10-CM | POA: Insufficient documentation

## 2021-06-29 HISTORY — PX: AMPUTATION: SHX166

## 2021-06-29 LAB — GLUCOSE, CAPILLARY
Glucose-Capillary: 216 mg/dL — ABNORMAL HIGH (ref 70–99)
Glucose-Capillary: 149 mg/dL — ABNORMAL HIGH (ref 70–99)

## 2021-06-29 LAB — POCT PREGNANCY, URINE: Preg Test, Ur: NEGATIVE

## 2021-06-29 SURGERY — AMPUTATION, FOOT, PARTIAL
Anesthesia: General | Site: Foot | Laterality: Right

## 2021-06-29 MED ORDER — CEFAZOLIN SODIUM 1 G IJ SOLR
INTRAMUSCULAR | Status: AC
  Filled 2021-06-29: qty 30

## 2021-06-29 MED ORDER — LACTATED RINGERS IV SOLN: INTRAVENOUS | Status: DC

## 2021-06-29 MED ORDER — ACETAMINOPHEN 500 MG PO TABS
ORAL_TABLET | ORAL | Status: AC
  Administered 2021-06-29: 1000 mg via ORAL
  Filled 2021-06-29: qty 2

## 2021-06-29 MED ORDER — FENTANYL CITRATE (PF) 250 MCG/5ML IJ SOLN
INTRAMUSCULAR | Status: DC | PRN
  Administered 2021-06-29: 50 ug via INTRAVENOUS

## 2021-06-29 MED ORDER — LIDOCAINE HCL (CARDIAC) PF 100 MG/5ML IV SOSY
PREFILLED_SYRINGE | INTRAVENOUS | Status: DC | PRN
  Administered 2021-06-29: 100 mg via INTRATRACHEAL

## 2021-06-29 MED ORDER — CHLORHEXIDINE GLUCONATE 0.12 % MT SOLN
OROMUCOSAL | Status: AC
  Administered 2021-06-29: 15 mL via OROMUCOSAL
  Filled 2021-06-29: qty 15

## 2021-06-29 MED ORDER — OXYCODONE-ACETAMINOPHEN 5-325 MG PO TABS
1.0000 | ORAL_TABLET | ORAL | 0 refills | Status: DC | PRN
Start: 2021-06-29 — End: 2021-07-07

## 2021-06-29 MED ORDER — PROPOFOL 10 MG/ML IV BOLUS
INTRAVENOUS | Status: DC | PRN
  Administered 2021-06-29: 200 mg via INTRAVENOUS

## 2021-06-29 MED ORDER — FENTANYL CITRATE (PF) 250 MCG/5ML IJ SOLN
INTRAMUSCULAR | Status: AC
  Filled 2021-06-29: qty 5

## 2021-06-29 MED ORDER — ACETAMINOPHEN 500 MG PO TABS: 1000.0000 mg | ORAL_TABLET | Freq: Once | ORAL | Status: AC

## 2021-06-29 MED ORDER — MIDAZOLAM HCL 2 MG/2ML IJ SOLN
INTRAMUSCULAR | Status: AC
  Filled 2021-06-29: qty 2

## 2021-06-29 MED ORDER — FENTANYL CITRATE (PF) 100 MCG/2ML IJ SOLN
INTRAMUSCULAR | Status: AC
  Filled 2021-06-29: qty 2

## 2021-06-29 MED ORDER — ORAL CARE MOUTH RINSE: 15.0000 mL | Freq: Once | OROMUCOSAL | Status: AC

## 2021-06-29 MED ORDER — CEFAZOLIN SODIUM-DEXTROSE 2-4 GM/100ML-% IV SOLN
INTRAVENOUS | Status: AC
  Filled 2021-06-29: qty 100

## 2021-06-29 MED ORDER — CHLORHEXIDINE GLUCONATE 0.12 % MT SOLN
OROMUCOSAL | Status: AC
  Filled 2021-06-29: qty 15

## 2021-06-29 MED ORDER — DIPHENHYDRAMINE HCL 50 MG/ML IJ SOLN
INTRAMUSCULAR | Status: DC | PRN
  Administered 2021-06-29: 12.5 mg via INTRAVENOUS

## 2021-06-29 MED ORDER — MEPERIDINE HCL 25 MG/ML IJ SOLN: 6.2500 mg | INTRAMUSCULAR | Status: DC | PRN

## 2021-06-29 MED ORDER — ACETAMINOPHEN 500 MG PO TABS
ORAL_TABLET | ORAL | Status: AC
  Filled 2021-06-29: qty 2

## 2021-06-29 MED ORDER — HYDROMORPHONE HCL 1 MG/ML IJ SOLN: 0.2500 mg | INTRAMUSCULAR | Status: DC | PRN

## 2021-06-29 MED ORDER — MIDAZOLAM HCL 2 MG/2ML IJ SOLN
INTRAMUSCULAR | Status: DC | PRN
  Administered 2021-06-29: 2 mg via INTRAVENOUS

## 2021-06-29 MED ORDER — CHLORHEXIDINE GLUCONATE 0.12 % MT SOLN: 15.0000 mL | Freq: Once | OROMUCOSAL | Status: AC

## 2021-06-29 MED ORDER — PROMETHAZINE HCL 25 MG/ML IJ SOLN: 6.2500 mg | INTRAMUSCULAR | Status: DC | PRN

## 2021-06-29 MED ORDER — 0.9 % SODIUM CHLORIDE (POUR BTL) OPTIME
TOPICAL | Status: DC | PRN
  Administered 2021-06-29: 1000 mL

## 2021-06-29 MED ORDER — ONDANSETRON HCL 4 MG/2ML IJ SOLN
INTRAMUSCULAR | Status: DC | PRN
  Administered 2021-06-29: 4 mg via INTRAVENOUS

## 2021-06-29 SURGICAL SUPPLY — 33 items
BAG COUNTER SPONGE SURGICOUNT (BAG) ×2 IMPLANT
BENZOIN TINCTURE PRP APPL 2/3 (GAUZE/BANDAGES/DRESSINGS) ×2 IMPLANT
BLADE SAW SGTL HD 18.5X60.5X1. (BLADE) ×2 IMPLANT
BLADE SURG 21 STRL SS (BLADE) ×2 IMPLANT
BNDG COHESIVE 4X5 TAN STRL (GAUZE/BANDAGES/DRESSINGS) IMPLANT
BNDG GAUZE ELAST 4 BULKY (GAUZE/BANDAGES/DRESSINGS) IMPLANT
COVER SURGICAL LIGHT HANDLE (MISCELLANEOUS) ×2 IMPLANT
DRAPE DERMATAC (DRAPES) ×2 IMPLANT
DRAPE INCISE IOBAN 66X45 STRL (DRAPES) ×2 IMPLANT
DRAPE U-SHAPE 47X51 STRL (DRAPES) ×2 IMPLANT
DRSG ADAPTIC 3X8 NADH LF (GAUZE/BANDAGES/DRESSINGS) IMPLANT
DRSG PAD ABDOMINAL 8X10 ST (GAUZE/BANDAGES/DRESSINGS) IMPLANT
DURAPREP 26ML APPLICATOR (WOUND CARE) ×2 IMPLANT
ELECT REM PT RETURN 9FT ADLT (ELECTROSURGICAL) ×2
ELECTRODE REM PT RTRN 9FT ADLT (ELECTROSURGICAL) ×1 IMPLANT
GAUZE SPONGE 4X4 12PLY STRL (GAUZE/BANDAGES/DRESSINGS) IMPLANT
GLOVE SURG ORTHO LTX SZ9 (GLOVE) ×2 IMPLANT
GLOVE SURG UNDER POLY LF SZ9 (GLOVE) ×2 IMPLANT
GOWN STRL REUS W/ TWL XL LVL3 (GOWN DISPOSABLE) ×3 IMPLANT
GOWN STRL REUS W/TWL XL LVL3 (GOWN DISPOSABLE) ×3
KIT BASIN OR (CUSTOM PROCEDURE TRAY) ×2 IMPLANT
KIT DRSG PREVENA PLUS 7DAY 125 (MISCELLANEOUS) ×1 IMPLANT
KIT TURNOVER KIT B (KITS) ×2 IMPLANT
NS IRRIG 1000ML POUR BTL (IV SOLUTION) ×2 IMPLANT
PACK ORTHO EXTREMITY (CUSTOM PROCEDURE TRAY) ×2 IMPLANT
PAD ARMBOARD 7.5X6 YLW CONV (MISCELLANEOUS) ×4 IMPLANT
SPONGE T-LAP 18X18 ~~LOC~~+RFID (SPONGE) IMPLANT
SUT ETHILON 2 0 PSLX (SUTURE) ×4 IMPLANT
TOWEL GREEN STERILE (TOWEL DISPOSABLE) ×2 IMPLANT
TOWEL GREEN STERILE FF (TOWEL DISPOSABLE) ×2 IMPLANT
TUBE CONNECTING 12X1/4 (SUCTIONS) ×2 IMPLANT
WATER STERILE IRR 1000ML POUR (IV SOLUTION) ×2 IMPLANT
YANKAUER SUCT BULB TIP NO VENT (SUCTIONS) ×2 IMPLANT

## 2021-06-29 NOTE — Anesthesia Procedure Notes (Signed)
Procedure Name: LMA Insertion Date/Time: 06/29/2021 8:46 AM Performed by: Darryl Nestle, CRNA Pre-anesthesia Checklist: Patient identified, Emergency Drugs available, Suction available and Patient being monitored Patient Re-evaluated:Patient Re-evaluated prior to induction Oxygen Delivery Method: Circle system utilized Preoxygenation: Pre-oxygenation with 100% oxygen Induction Type: IV induction LMA: LMA inserted LMA Size: 4.0 Tube type: Oral Number of attempts: 1 Placement Confirmation: positive ETCO2 and breath sounds checked- equal and bilateral Tube secured with: Tape Dental Injury: Teeth and Oropharynx as per pre-operative assessment

## 2021-06-29 NOTE — Anesthesia Postprocedure Evaluation (Signed)
Anesthesia Post Note  Patient: Valerie Le  Procedure(s) Performed: REVISION RIGHT TRANSMETATARSAL AMPUTATION (Right: Foot)     Patient location during evaluation: PACU Anesthesia Type: General Level of consciousness: sedated and patient cooperative Pain management: pain level controlled Vital Signs Assessment: post-procedure vital signs reviewed and stable Respiratory status: spontaneous breathing Cardiovascular status: stable Anesthetic complications: no   No notable events documented.  Last Vitals:  Vitals:   06/29/21 0945 06/29/21 1000  BP: 127/76 108/61  Pulse: 78 80  Resp: 14 18  Temp:  (!) 36.3 C  SpO2: 97% 99%    Last Pain:  Vitals:   06/29/21 1000  TempSrc:   PainSc: 0-No pain                 Nolon Nations

## 2021-06-29 NOTE — H&P (Signed)
Valerie Le is an 51 y.o. female.   Chief Complaint: Ulceration right transmetatarsal amputation. HPI: Patient is status post transmetatarsal amputation of the right.  Surgery in July.  Patient has had progressive wound dehiscence she has been on 2 antibiotics.  Past Medical History:  Diagnosis Date   Anemia    Asthma    as a child   Depression    Diabetes mellitus without complication (H[facility])    Type 1 - Has Insulin Pump   Headache    migraines   High cholesterol    Hypertension     Past Surgical History:  Procedure Laterality Date   AMPUTATION Right 12/22/2020   Procedure: RIGHT TRANSMETATARSAL AMPUTATION;  Surgeon: [name], MD;  Location: [facility];  Service: Orthopedics;  Laterality: Right;   AMPUTATION TOE Right 12/04/2020   Procedure: AMPUTATION RIGHT SECOND TOE, I&D FOOT;  Surgeon: Eldred Manges, MD;  Location: [facility];  Service: Orthopedics;  Laterality: Right;  Right second toe amputation, I&D right foot.   birth control implant  07/31/2006   Essure Implant   TONSILLECTOMY     TYMPANOSTOMY TUBE PLACEMENT     WISDOM TOOTH EXTRACTION      History reviewed. No pertinent family history. Social History:  reports that she has been smoking cigarettes. She has a 6.25 pack-year smoking history. She has never used smokeless tobacco. She reports that she does not currently use alcohol. She reports that she does not currently use drugs.  Allergies:  Allergies  Allergen Reactions   Albiglutide Itching    Itching site reaction with burning, itching and pain   Coconut Oil Anaphylaxis   Pineapple Anaphylaxis   Tomato Anaphylaxis   Glimepiride Other (See Comments)    Numbness and abdominal pain   Latex Hives   Metformin And Related Other (See Comments)    Constipation/cramps   Glipizide Diarrhea    No medications prior to admission.    No results found for this or any previous visit (from the past 48 hour(s)). No results found.  Review of Systems  All other  systems reviewed and are negative.  Height 5\' 11"  (1.803 m), weight 117.9 kg, last menstrual period 06/19/2021. Physical Exam  Patient is alert, oriented, no adenopathy, well-dressed, normal affect, normal respiratory effort. Examination patient has a palpable dorsalis pedis and posterior tibial pulse.  The previous incision is well-healed there is no redness no cellulitis there is some swelling.  The ulcer is 1 cm diameter and probes to bone.  There is no purulence there is serosanguineous drainage no ascending cellulitis the calf is soft nontender. Assessment/Plan Osteomyelitis and ulceration right foot status post transmetatarsal amputation.    Plan: We will plan for revision of the transmetatarsal amputation send tissue and bone for cultures.  Risks and benefits were discussed including risk of further wound breakdown need for additional surgery.  Patient states she understands wished to proceed.  Discussed the importance of nutritional supplements including omega-3 vitamin D3 and protein supplements.  08/17/2021, MD 06/29/2021, 6:39 AM

## 2021-06-29 NOTE — Op Note (Signed)
06/29/2021  9:19 AM  PATIENT:  Valerie Le    PRE-OPERATIVE DIAGNOSIS:  Osteomyelitis Right Foot  POST-OPERATIVE DIAGNOSIS:  Same  PROCEDURE: Right Chopart AMPUTATION  SURGEON:  Newt Minion, MD  PHYSICIAN ASSISTANT:None ANESTHESIA:   General  PREOPERATIVE INDICATIONS:  Valerie Le is a  51 y.o. female with a diagnosis of Osteomyelitis Right Foot who failed conservative measures and elected for surgical management.    The risks benefits and alternatives were discussed with the patient preoperatively including but not limited to the risks of infection, bleeding, nerve injury, cardiopulmonary complications, the need for revision surgery, among others, and the patient was willing to proceed.  OPERATIVE IMPLANTS: 13 cm Prevena wound VAC.  @ENCIMAGES @  OPERATIVE FINDINGS: Tissue margins clear bone and soft tissue sent for cultures.  OPERATIVE PROCEDURE: Patient was brought the operating room and underwent a general anesthetic.  After adequate levels anesthesia were obtained patient's right lower extremity was prepped using DuraPrep draped into a sterile field a timeout was called.  A fishmouth incision was made around the ulcerative tissue that extended down to bone.  A oscillating saw was used to perform a revision of the midfoot amputation through the metatarsals.  The metatarsals and soft tissue were sent for cultures.  Electrocardio was used hemostasis wound was irrigated normal saline the margins were clear.  The incision was closed using 2-0 nylon a Praveena wound VAC was applied this had a good suction fit patient was extubated taken the PACU in stable condition.   DISCHARGE PLANNING:  Antibiotic duration: Preoperative antibiotics  Weightbearing: Touchdown weightbearing on the right  Pain medication: Prescription called in for Percocet  Dressing care/ Wound VAC: Continue wound VAC for 1 week  Ambulatory devices: Crutches or walker  Discharge to:  Home.  Follow-up: In the office 1 week post operative.

## 2021-06-29 NOTE — Transfer of Care (Signed)
Immediate Anesthesia Transfer of Care Note  Patient: Valerie Le  Procedure(s) Performed: REVISION RIGHT TRANSMETATARSAL AMPUTATION (Right: Foot)  Patient Location: PACU  Anesthesia Type:General  Level of Consciousness: sedated  Airway & Oxygen Therapy: Patient Spontanous Breathing and Patient connected to nasal cannula oxygen  Post-op Assessment: Report given to RN and Post -op Vital signs reviewed and stable  Post vital signs: Reviewed and stable  Last Vitals:  Vitals Value Taken Time  BP 95/56 06/29/21 0915  Temp    Pulse 76 06/29/21 0916  Resp 13 06/29/21 0915  SpO2 100 % 06/29/21 0916  Vitals shown include unvalidated device data.  Last Pain:  Vitals:   06/29/21 0722  TempSrc:   PainSc: 0-No pain         Complications: No notable events documented.

## 2021-06-30 ENCOUNTER — Encounter (HOSPITAL_COMMUNITY): Payer: Self-pay | Admitting: Orthopedic Surgery

## 2021-07-04 LAB — AEROBIC/ANAEROBIC CULTURE W GRAM STAIN (SURGICAL/DEEP WOUND)

## 2021-07-06 ENCOUNTER — Other Ambulatory Visit: Payer: Self-pay

## 2021-07-06 ENCOUNTER — Ambulatory Visit (INDEPENDENT_AMBULATORY_CARE_PROVIDER_SITE_OTHER): Payer: Managed Care, Other (non HMO) | Admitting: Family

## 2021-07-06 DIAGNOSIS — Z89431 Acquired absence of right foot: Secondary | ICD-10-CM

## 2021-07-07 ENCOUNTER — Other Ambulatory Visit: Payer: Self-pay | Admitting: Orthopedic Surgery

## 2021-07-07 ENCOUNTER — Telehealth: Payer: Self-pay

## 2021-07-07 MED ORDER — SULFAMETHOXAZOLE-TRIMETHOPRIM 800-160 MG PO TABS: 1.0000 | ORAL_TABLET | Freq: Two times a day (BID) | ORAL | 0 refills | Status: DC

## 2021-07-07 NOTE — Telephone Encounter (Signed)
Called pt to advise that per Dr. Lajoyce Corners her cultures have come back and the bacteria is not sensitive to the abx that she is taking. Advised that Bactrim DS was called in for her and that she should stop what she is taking and start that abx. Pt voiced understanding. Also advised that Denny Peon was going to send in an rx for a nitro patch for her. Advised that I will ask her about this in the morning and send to [facility].

## 2021-07-08 MED ORDER — OXYCODONE-ACETAMINOPHEN 5-325 MG PO TABS: 1.0000 | ORAL_TABLET | ORAL | 0 refills | Status: DC | PRN

## 2021-07-08 MED ORDER — NITROGLYCERIN 0.2 MG/HR TD PT24: 0.2000 mg | MEDICATED_PATCH | Freq: Every day | TRANSDERMAL | 3 refills | Status: DC

## 2021-07-08 NOTE — Telephone Encounter (Signed)
Please put in rx for nitro patch pt states you advised she should start using at last visit. Thanks!

## 2021-07-08 NOTE — Addendum Note (Signed)
Addended by: Barnie Del R on: 07/08/2021 11:22 AM   Modules accepted: Orders

## 2021-07-12 ENCOUNTER — Telehealth: Payer: Self-pay | Admitting: Orthopedic Surgery

## 2021-07-12 ENCOUNTER — Encounter: Payer: Self-pay | Admitting: Orthopedic Surgery

## 2021-07-12 NOTE — Telephone Encounter (Signed)
Pt called asking for a CB in regards to the types of lotions she can use if she can use any at all. Pt would like to be advised.   [phone]

## 2021-07-12 NOTE — Telephone Encounter (Signed)
I called pt advised not to use lotion on surgical site. She also sent a mychart message with a photo of her transmet amputation. Pt is diabetic and she is still smoking. Advised that the incision is going to have to heal from the inside out. The diabetes can make this healing process take longer. To make sure she is managing her BS. Stop smoking or try very hard to cut back this is affecting the micro circulation that is trying to get blood down to the foot to help it to heal. Continue to use her nitro patch daily and keep the area clean an covered. Pt states that she has been weight bearing to get to the bathroom. Advised pt to utilize her crutches and keep weight off of foot putting too much stress on the incision. To keep it elevated to help with swelling. Offered appt tomorrow for eval and declined. She will come in on Thursday to see Erin. Will call with any questions.

## 2021-07-15 ENCOUNTER — Other Ambulatory Visit: Payer: Self-pay

## 2021-07-15 ENCOUNTER — Ambulatory Visit (INDEPENDENT_AMBULATORY_CARE_PROVIDER_SITE_OTHER): Payer: Managed Care, Other (non HMO) | Admitting: Family

## 2021-07-15 DIAGNOSIS — M86271 Subacute osteomyelitis, right ankle and foot: Secondary | ICD-10-CM

## 2021-07-15 DIAGNOSIS — Z89431 Acquired absence of right foot: Secondary | ICD-10-CM

## 2021-07-20 ENCOUNTER — Encounter: Payer: Managed Care, Other (non HMO) | Admitting: Family

## 2021-07-22 ENCOUNTER — Encounter: Payer: Self-pay | Admitting: Family

## 2021-07-22 NOTE — Progress Notes (Signed)
Post-Op Visit Note   Patient: Valerie Le           Date of Birth: 1971/02/06           MRN: [id number] Visit Date: 07/15/2021 PCP: Maud Deed, PA  Chief Complaint:  Chief Complaint  Patient presents with   Right Foot - Routine Post Op    06/29/21 revision right transmet amputation     HPI:  HPI The patient is a 51 year old woman who presents status post revision right transmetatarsal amputation on January 11 she is currently using nitroglycerin patches as well as taking Bactrim DS twice daily.  She is using a wheelchair in her fracture boot.  She is nervous about her incision  Ortho Exam The incision is well-healed centrally there two areas to the ends of her incisions which have not yet healed these are gaped only 1 mm filled in with flat red tissue there is no active drainage no surrounding erythema no maceration no sign of infection  Visit Diagnoses: No diagnosis found.  Plan: She will continue with daily Dial soap cleansing.  Dry dressing changes.  Continue the nitroglycerin patch.  Anticipate harvesting sutures at next visit  Follow-Up Instructions: No follow-ups on file.   Imaging: No results found.  Orders:  No orders of the defined types were placed in this encounter.  No orders of the defined types were placed in this encounter.    PMFS History: Patient Active Problem List   Diagnosis Date Noted   Medication monitoring encounter 06/15/2021   Smoking 02/01/2021   Acute hematogenous osteomyelitis of right foot (HCC) 12/22/2020   Osteomyelitis of foot, right, acute (HCC)    Dehiscence of amputation stump (HCC)    Diabetic foot infection (HCC) 12/02/2020   Essential hypertension 12/02/2020   Hyperlipidemia associated with type 2 diabetes mellitus (HCC) 08/21/2018   Bipolar affective disorder, currently depressed, moderate (HCC) 01/23/2017   Morbid obesity due to excess calories (HCC) 02/15/2016   Insulin-treated type 2 diabetes mellitus (HCC)  03/07/2013   Asthma 03/07/2013   ADHD (attention deficit hyperactivity disorder) 03/07/2013   Migraine 03/07/2013   Tobacco abuse 03/07/2013   Past Medical History:  Diagnosis Date   Anemia    Asthma    as a child   Depression    Diabetes mellitus without complication (HCC)    Type 1 - Has Insulin Pump   Headache    migraines   High cholesterol    Hypertension     No family history on file.  Past Surgical History:  Procedure Laterality Date   AMPUTATION Right 12/22/2020   Procedure: RIGHT TRANSMETATARSAL AMPUTATION;  Surgeon: [name], MD;  Location: [facility] OR;  Service: Orthopedics;  Laterality: Right;   AMPUTATION Right 06/29/2021   Procedure: REVISION RIGHT TRANSMETATARSAL AMPUTATION;  Surgeon: [name], MD;  Location: [facility] OR;  Service: Orthopedics;  Laterality: Right;   AMPUTATION TOE Right 12/04/2020   Procedure: AMPUTATION RIGHT SECOND TOE, I&D FOOT;  Surgeon: Eldred Manges, MD;  Location: [facility];  Service: Orthopedics;  Laterality: Right;  Right second toe amputation, I&D right foot.   birth control implant  07/31/2006   Essure Implant   TONSILLECTOMY     TYMPANOSTOMY TUBE PLACEMENT     WISDOM TOOTH EXTRACTION     Social History   Occupational History   Not on file  Tobacco Use   Smoking status: Every Day    Packs/day: 0.25    Years: 25.00  Pack years: 6.25    Types: Cigarettes   Smokeless tobacco: Never   Tobacco comments:    Cut down on smoking while on Chantix-  Vaping Use   Vaping Use: Never used  Substance and Sexual Activity   Alcohol use: Not Currently    Comment: Once a year on New Year's Day   Drug use: Not Currently   Sexual activity: Yes    Birth control/protection: Implant    Comment: Essure Implant

## 2021-07-29 ENCOUNTER — Other Ambulatory Visit: Payer: Self-pay

## 2021-07-29 ENCOUNTER — Ambulatory Visit (INDEPENDENT_AMBULATORY_CARE_PROVIDER_SITE_OTHER): Payer: Managed Care, Other (non HMO) | Admitting: Family

## 2021-07-29 ENCOUNTER — Encounter: Payer: Self-pay | Admitting: Family

## 2021-07-29 DIAGNOSIS — Z89431 Acquired absence of right foot: Secondary | ICD-10-CM

## 2021-07-29 NOTE — Progress Notes (Signed)
Post-Op Visit Note   Patient: Valerie Le           Date of Birth: 1970-07-02           MRN: [id number] Visit Date: 07/06/2021 PCP: Willeen Niece, PA  Chief Complaint:  Chief Complaint  Patient presents with   Right Foot - Routine Post Op    06/29/21 revision right transmet amputation     HPI:  HPI The patient is a 51 year old woman who presents status post revision of her transmetatarsal amputation on January 11.  Wound VAC removed today. Ortho Exam On examination her incision is approximated with sutures there is no gaping no drainage no surrounding erythema. edema well controlled  Visit Diagnoses: No diagnosis found.  Plan: Begin daily.  Dry dressing changes.  Minimize weightbearing  Follow-Up Instructions: No follow-ups on file.   Imaging: No results found.  Orders:  No orders of the defined types were placed in this encounter.  No orders of the defined types were placed in this encounter.    PMFS History: Patient Active Problem List   Diagnosis Date Noted   Medication monitoring encounter 06/15/2021   Smoking 02/01/2021   Acute hematogenous osteomyelitis of right foot ([place]) 12/22/2020   Osteomyelitis of foot, right, acute ([place])    Dehiscence of amputation stump ([place])    Diabetic foot infection ([place]) 12/02/2020   Essential hypertension 12/02/2020   Hyperlipidemia associated with type 2 diabetes mellitus ([place]) 08/21/2018   Bipolar affective disorder, currently depressed, moderate ([place]) 01/23/2017   Morbid obesity due to excess calories ([place]) 02/15/2016   Insulin-treated type 2 diabetes mellitus ([place]) 03/07/2013   Asthma 03/07/2013   ADHD (attention deficit hyperactivity disorder) 03/07/2013   Migraine 03/07/2013   Tobacco abuse 03/07/2013   Past Medical History:  Diagnosis Date   Anemia    Asthma    as a child   Depression    Diabetes mellitus without complication ([place])    Type 1 - Has Insulin Pump   Headache    migraines   High  cholesterol    Hypertension     No family history on file.  Past Surgical History:  Procedure Laterality Date   AMPUTATION Right 12/22/2020   Procedure: RIGHT TRANSMETATARSAL AMPUTATION;  Surgeon: Newt Minion, MD;  Location: [facility];  Service: Orthopedics;  Laterality: Right;   AMPUTATION Right 06/29/2021   Procedure: REVISION RIGHT TRANSMETATARSAL AMPUTATION;  Surgeon: Newt Minion, MD;  Location: [facility];  Service: Orthopedics;  Laterality: Right;   AMPUTATION TOE Right 12/04/2020   Procedure: AMPUTATION RIGHT SECOND TOE, I&D FOOT;  Surgeon: Marybelle Killings, MD;  Location: [facility];  Service: Orthopedics;  Laterality: Right;  Right second toe amputation, I&D right foot.   birth control implant  07/31/2006   Essure Implant   TONSILLECTOMY     TYMPANOSTOMY TUBE PLACEMENT     WISDOM TOOTH EXTRACTION     Social History   Occupational History   Not on file  Tobacco Use   Smoking status: Every Day    Packs/day: 0.25    Years: 25.00    Pack years: 6.25    Types: Cigarettes   Smokeless tobacco: Never   Tobacco comments:    Cut down on smoking while on Chantix-  Vaping Use   Vaping Use: Never used  Substance and Sexual Activity   Alcohol use: Not Currently    Comment: Once a year on New Year's Day   Drug use: Not Currently  Sexual activity: Yes    Birth control/protection: Implant    Comment: Essure Implant

## 2021-08-03 ENCOUNTER — Encounter: Payer: Self-pay | Admitting: Family

## 2021-08-03 NOTE — Progress Notes (Signed)
Post-Op Visit Note   Patient: Valerie Le           Date of Birth: [birth year]           MRN: [id number] Visit Date: 07/29/2021 PCP: Maud Deed, PA  Chief Complaint:  Chief Complaint  Patient presents with   Right Foot - Routine Post Op    Revision right transmet amputation    HPI:  HPI The patient is a 51 year old woman who presents status post revision right transmetatarsal amputation on January 11 she is currently using nitroglycerin patches.  Ortho Exam The incision is gaped open 1 mm wide with 1 mm of depth the length of the incision. This is filled in with granulation. there is no active drainage no surrounding erythema no maceration no sign of infection  Visit Diagnoses:  1. History of transmetatarsal amputation of right foot (HCC)     Plan: She will continue with daily Dial soap cleansing.  Dry dressing changes.  Continue the nitroglycerin patch.  Sutures harvested today. Continue to minimize weightbearing.  Follow-Up Instructions: Return in about 2 weeks (around 08/12/2021).   Imaging: No results found.  Orders:  No orders of the defined types were placed in this encounter.  No orders of the defined types were placed in this encounter.    PMFS History: Patient Active Problem List   Diagnosis Date Noted   Medication monitoring encounter 06/15/2021   Smoking 02/01/2021   Acute hematogenous osteomyelitis of right foot (HCC) 12/22/2020   Osteomyelitis of foot, right, acute (HCC)    Dehiscence of amputation stump (HCC)    Diabetic foot infection (HCC) 12/02/2020   Essential hypertension 12/02/2020   Hyperlipidemia associated with type 2 diabetes mellitus (HCC) 08/21/2018   Bipolar affective disorder, currently depressed, moderate (HCC) 01/23/2017   Morbid obesity due to excess calories (HCC) 02/15/2016   Insulin-treated type 2 diabetes mellitus (HCC) 03/07/2013   Asthma 03/07/2013   ADHD (attention deficit hyperactivity disorder) 03/07/2013    Migraine 03/07/2013   Tobacco abuse 03/07/2013   Past Medical History:  Diagnosis Date   Anemia    Asthma    as a child   Depression    Diabetes mellitus without complication (HCC)    Type 1 - Has Insulin Pump   Headache    migraines   High cholesterol    Hypertension     History reviewed. No pertinent family history.  Past Surgical History:  Procedure Laterality Date   AMPUTATION Right 12/22/2020   Procedure: RIGHT TRANSMETATARSAL AMPUTATION;  Surgeon: [name], MD;  Location: [facility] OR;  Service: Orthopedics;  Laterality: Right;   AMPUTATION Right 06/29/2021   Procedure: REVISION RIGHT TRANSMETATARSAL AMPUTATION;  Surgeon: [name], MD;  Location: [facility] OR;  Service: Orthopedics;  Laterality: Right;   AMPUTATION TOE Right 12/04/2020   Procedure: AMPUTATION RIGHT SECOND TOE, I&D FOOT;  Surgeon: Eldred Manges, MD;  Location: [facility];  Service: Orthopedics;  Laterality: Right;  Right second toe amputation, I&D right foot.   birth control implant  07/31/2006   Essure Implant   TONSILLECTOMY     TYMPANOSTOMY TUBE PLACEMENT     WISDOM TOOTH EXTRACTION     Social History   Occupational History   Not on file  Tobacco Use   Smoking status: Every Day    Packs/day: 0.25    Years: 25.00    Pack years: 6.25    Types: Cigarettes   Smokeless tobacco: Never   Tobacco comments:  Cut down on smoking while on Chantix-  Vaping Use   Vaping Use: Never used  Substance and Sexual Activity   Alcohol use: Not Currently    Comment: Once a year on New Year's Day   Drug use: Not Currently   Sexual activity: Yes    Birth control/protection: Implant    Comment: Essure Implant

## 2021-08-16 ENCOUNTER — Encounter: Payer: Self-pay | Admitting: Family

## 2021-08-16 ENCOUNTER — Ambulatory Visit (INDEPENDENT_AMBULATORY_CARE_PROVIDER_SITE_OTHER): Payer: Managed Care, Other (non HMO) | Admitting: Family

## 2021-08-16 DIAGNOSIS — Z89431 Acquired absence of right foot: Secondary | ICD-10-CM

## 2021-08-16 NOTE — Progress Notes (Signed)
Post-Op Visit Note   Patient: Valerie Le           Date of Birth: 12/14/1970           MRN: [id number] Visit Date: 08/16/2021 PCP: Maud Deed, PA  Chief Complaint:  Chief Complaint  Patient presents with   Right Foot - Routine Post Op    06/29/21 revision right transmet amputation    HPI:  HPI The patient is a 51 year old woman who presents status post revision right transmetatarsal amputation Ortho Exam On examination of the right foot her incision is healing slowly proximally there is an area that is 5 mm in diameter with proud granulation this is not yet healed this does not probe there is no active drainage no surrounding maceration no erythema  Visit Diagnoses: No diagnosis found.  Plan: She will continue with daily Dial soap cleansing antibacterial ointment dressing changes minimizing weightbearing.  She will continue on light duty at work  Follow-Up Instructions: No follow-ups on file.   Imaging: No results found.  Orders:  No orders of the defined types were placed in this encounter.  No orders of the defined types were placed in this encounter.    PMFS History: Patient Active Problem List   Diagnosis Date Noted   Medication monitoring encounter 06/15/2021   Smoking 02/01/2021   Acute hematogenous osteomyelitis of right foot (HCC) 12/22/2020   Osteomyelitis of foot, right, acute (HCC)    Dehiscence of amputation stump (HCC)    Diabetic foot infection (HCC) 12/02/2020   Essential hypertension 12/02/2020   Hyperlipidemia associated with type 2 diabetes mellitus (HCC) 08/21/2018   Bipolar affective disorder, currently depressed, moderate (HCC) 01/23/2017   Morbid obesity due to excess calories (HCC) 02/15/2016   Insulin-treated type 2 diabetes mellitus (HCC) 03/07/2013   Asthma 03/07/2013   ADHD (attention deficit hyperactivity disorder) 03/07/2013   Migraine 03/07/2013   Tobacco abuse 03/07/2013   Past Medical History:  Diagnosis Date    Anemia    Asthma    as a child   Depression    Diabetes mellitus without complication (HCC)    Type 1 - Has Insulin Pump   Headache    migraines   High cholesterol    Hypertension     No family history on file.  Past Surgical History:  Procedure Laterality Date   AMPUTATION Right 12/22/2020   Procedure: RIGHT TRANSMETATARSAL AMPUTATION;  Surgeon: [name], MD;  Location: [facility] OR;  Service: Orthopedics;  Laterality: Right;   AMPUTATION Right 06/29/2021   Procedure: REVISION RIGHT TRANSMETATARSAL AMPUTATION;  Surgeon: [name], MD;  Location[facility] OR;  Service: Orthopedics;  Laterality: Right;   AMPUTATION TOE Right 12/04/2020   Procedure: AMPUTATION RIGHT SECOND TOE, I&D FOOT;  Surgeon: Eldred Manges, MD;  Location: [facility];  Service: Orthopedics;  Laterality: Right;  Right second toe amputation, I&D right foot.   birth control implant  07/31/2006   Essure Implant   TONSILLECTOMY     TYMPANOSTOMY TUBE PLACEMENT     WISDOM TOOTH EXTRACTION     Social History   Occupational History   Not on file  Tobacco Use   Smoking status: Every Day    Packs/day: 0.25    Years: 25.00    Pack years: 6.25    Types: Cigarettes   Smokeless tobacco: Never   Tobacco comments:    Cut down on smoking while on Chantix-  Vaping Use   Vaping Use: Never used  Substance  and Sexual Activity   Alcohol use: Not Currently    Comment: Once a year on New Year's Day   Drug use: Not Currently   Sexual activity: Yes    Birth control/protection: Implant    Comment: Essure Implant

## 2021-08-30 ENCOUNTER — Encounter: Payer: Self-pay | Admitting: Family

## 2021-08-30 ENCOUNTER — Ambulatory Visit (INDEPENDENT_AMBULATORY_CARE_PROVIDER_SITE_OTHER): Payer: Managed Care, Other (non HMO) | Admitting: Family

## 2021-08-30 DIAGNOSIS — Z89431 Acquired absence of right foot: Secondary | ICD-10-CM

## 2021-08-30 NOTE — Progress Notes (Signed)
? ?Post-Op Visit Note ?  ?Patient: Valerie Le           ?Date of Birth: 10/03/1970           ?MRN: [id number] ?Visit Date: 08/30/2021 ?PCP: Maud Deed, PA ? ?Chief Complaint:  ?Chief Complaint  ?Patient presents with  ? Right Foot - Routine Post Op  ?  06/29/21 revision right transmet amputation  ? ? ?HPI:  ?HPI ?The patient is a 51 year old woman who presents status post revision right transmetatarsal amputation. Has been doing dial soap cleansing and antibacterial ointment dressing changes. ?Has returned to work with custom orthotics which were fabricated in 10/22. ? ?Ortho Exam ?On examination of the right foot her incision is healing. The area over amputation distally is dry with 2 mm in diameter eschar. No erythema or drainage. Over lateral column has an open area. Surrounding nonviable tissue debrided with 10 blade knife. Underlying ulcer is 23mm in diameter. No probing or drainage. No erythema or tenderness. ? ?Visit Diagnoses: No diagnosis found. ? ?Plan: cut out area to relieve pressure along lateral ulcer. She will continue with daily Dial soap cleansing antibacterial ointment dressing changes minimizing weightbearing.  She will continue on light duty at work ? ?Follow-Up Instructions: No follow-ups on file.  ? ?Imaging: ?No results found. ? ?Orders:  ?No orders of the defined types were placed in this encounter. ? ?No orders of the defined types were placed in this encounter. ? ? ? ?PMFS History: ?Patient Active Problem List  ? Diagnosis Date Noted  ? Medication monitoring encounter 06/15/2021  ? Smoking 02/01/2021  ? Acute hematogenous osteomyelitis of right foot (HCC) 12/22/2020  ? Osteomyelitis of foot, right, acute (HCC)   ? Dehiscence of amputation stump (HCC)   ? Diabetic foot infection (HCC) 12/02/2020  ? Essential hypertension 12/02/2020  ? Hyperlipidemia associated with type 2 diabetes mellitus (HCC) 08/21/2018  ? Bipolar affective disorder, currently depressed, moderate (HCC)  01/23/2017  ? Morbid obesity due to excess calories (HCC) 02/15/2016  ? Insulin-treated type 2 diabetes mellitus (HCC) 03/07/2013  ? Asthma 03/07/2013  ? ADHD (attention deficit hyperactivity disorder) 03/07/2013  ? Migraine 03/07/2013  ? Tobacco abuse 03/07/2013  ? ?Past Medical History:  ?Diagnosis Date  ? Anemia   ? Asthma   ? as a child  ? Depression   ? Diabetes mellitus without complication (HCC)   ? Type 1 - Has Insulin Pump  ? Headache   ? migraines  ? High cholesterol   ? Hypertension   ?  ?No family history on file.  ?Past Surgical History:  ?Procedure Laterality Date  ? AMPUTATION Right 12/22/2020  ? Procedure: RIGHT TRANSMETATARSAL AMPUTATION;  Surgeon: [name], MD;  Location: [facility] OR;  Service: Orthopedics;  Laterality: Right;  ? AMPUTATION Right 06/29/2021  ? Procedure: REVISION RIGHT TRANSMETATARSAL AMPUTATION;  Surgeon: [name], MD;  Location: [facility] OR;  Service: Orthopedics;  Laterality: Right;  ? AMPUTATION TOE Right 12/04/2020  ? Procedure: AMPUTATION RIGHT SECOND TOE, I&D FOOT;  Surgeon: Eldred Manges, MD;  Location: [facility];  Service: Orthopedics;  Laterality: Right;  Right second toe amputation, I&D right foot.  ? birth control implant  07/31/2006  ? Essure Implant  ? TONSILLECTOMY    ? TYMPANOSTOMY TUBE PLACEMENT    ? WISDOM TOOTH EXTRACTION    ? ?Social History  ? ?Occupational History  ? Not on file  ?Tobacco Use  ? Smoking status: Every Day  ?  Packs/day: 0.25  ?  Years: 25.00  ?  Pack years: 6.25  ?  Types: Cigarettes  ? Smokeless tobacco: Never  ? Tobacco comments:  ?  Cut down on smoking while on Chantix-  ?Vaping Use  ? Vaping Use: Never used  ?Substance and Sexual Activity  ? Alcohol use: Not Currently  ?  Comment: Once a year on New Year's Day  ? Drug use: Not Currently  ? Sexual activity: Yes  ?  Birth control/protection: Implant  ?  Comment: Essure Implant  ? ? ?

## 2021-09-13 ENCOUNTER — Encounter: Payer: Self-pay | Admitting: Family

## 2021-09-13 ENCOUNTER — Ambulatory Visit (INDEPENDENT_AMBULATORY_CARE_PROVIDER_SITE_OTHER): Payer: Managed Care, Other (non HMO) | Admitting: Family

## 2021-09-13 DIAGNOSIS — Z89431 Acquired absence of right foot: Secondary | ICD-10-CM

## 2021-09-13 NOTE — Progress Notes (Signed)
? ?Post-Op Visit Note ?  ?Patient: Valerie Le           ?Date of Birth: Nov 16, 1970           ?MRN: [id number] ?Visit Date: 09/13/2021 ?PCP: Maud Deed, PA ? ?Chief Complaint:  ?Chief Complaint  ?Patient presents with  ? Right Foot - Routine Post Op  ?  06/29/21 revision right transmet amputation  ? ? ?HPI:  ?HPI ?The patient is a 51 year old woman who presents status post revision right transmetatarsal amputation. Has been doing dial soap cleansing and antibacterial ointment dressing changes. ?Has returned to work with custom orthotics which were fabricated in 10/22. ? ?Ortho Exam ?On examination of the right foot her incision is healed distally. Along the lateral column there is an area of ulceration. This is 10 mm in diameter this is filled in with fibrinous tissue. There is surrounding maceration. No erythema or tenderness. ? ?Visit Diagnoses: No diagnosis found. ? ?Plan:  She will continue with daily Dial soap cleansing. Given silver cell for packing.  She will continue on light duty at work ? ?Follow-Up Instructions: Return in about 3 weeks (around 10/04/2021).  ? ?Imaging: ?No results found. ? ?Orders:  ?No orders of the defined types were placed in this encounter. ? ?No orders of the defined types were placed in this encounter. ? ? ? ?PMFS History: ?Patient Active Problem List  ? Diagnosis Date Noted  ? Medication monitoring encounter 06/15/2021  ? Smoking 02/01/2021  ? Acute hematogenous osteomyelitis of right foot (HCC) 12/22/2020  ? Osteomyelitis of foot, right, acute (HCC)   ? Dehiscence of amputation stump (HCC)   ? Diabetic foot infection (HCC) 12/02/2020  ? Essential hypertension 12/02/2020  ? Hyperlipidemia associated with type 2 diabetes mellitus (HCC) 08/21/2018  ? Bipolar affective disorder, currently depressed, moderate (HCC) 01/23/2017  ? Morbid obesity due to excess calories (HCC) 02/15/2016  ? Insulin-treated type 2 diabetes mellitus (HCC) 03/07/2013  ? Asthma 03/07/2013  ? ADHD  (attention deficit hyperactivity disorder) 03/07/2013  ? Migraine 03/07/2013  ? Tobacco abuse 03/07/2013  ? ?Past Medical History:  ?Diagnosis Date  ? Anemia   ? Asthma   ? as a child  ? Depression   ? Diabetes mellitus without complication (HCC)   ? Type 1 - Has Insulin Pump  ? Headache   ? migraines  ? High cholesterol   ? Hypertension   ?  ?History reviewed. No pertinent family history.  ?Past Surgical History:  ?Procedure Laterality Date  ? AMPUTATION Right 12/22/2020  ? Procedure: RIGHT TRANSMETATARSAL AMPUTATION;  Surgeon: [name], MD;  Location: [facility] OR;  Service: Orthopedics;  Laterality: Right;  ? AMPUTATION Right 06/29/2021  ? Procedure: REVISION RIGHT TRANSMETATARSAL AMPUTATION;  Surgeon: [name], MD;  Location: [facility] OR;  Service: Orthopedics;  Laterality: Right;  ? AMPUTATION TOE Right 12/04/2020  ? Procedure: AMPUTATION RIGHT SECOND TOE, I&D FOOT;  Surgeon: Eldred Manges, MD;  Location: [facility] ORS;  Service: Orthopedics;  Laterality: Right;  Right second toe amputation, I&D right foot.  ? birth control implant  07/31/2006  ? Essure Implant  ? TONSILLECTOMY    ? TYMPANOSTOMY TUBE PLACEMENT    ? WISDOM TOOTH EXTRACTION    ? ?Social History  ? ?Occupational History  ? Not on file  ?Tobacco Use  ? Smoking status: Every Day  ?  Packs/day: 0.25  ?  Years: 25.00  ?  Pack years: 6.25  ?  Types: Cigarettes  ? Smokeless tobacco:  Never  ? Tobacco comments:  ?  Cut down on smoking while on Chantix-  ?Vaping Use  ? Vaping Use: Never used  ?Substance and Sexual Activity  ? Alcohol use: Not Currently  ?  Comment: Once a year on New Year's Day  ? Drug use: Not Currently  ? Sexual activity: Yes  ?  Birth control/protection: Implant  ?  Comment: Essure Implant  ? ? ?

## 2021-09-23 ENCOUNTER — Other Ambulatory Visit: Payer: Self-pay | Admitting: Orthopedic Surgery

## 2021-10-11 ENCOUNTER — Ambulatory Visit (INDEPENDENT_AMBULATORY_CARE_PROVIDER_SITE_OTHER): Payer: Managed Care, Other (non HMO) | Admitting: Family

## 2021-10-11 ENCOUNTER — Encounter: Payer: Self-pay | Admitting: Family

## 2021-10-11 DIAGNOSIS — Z89431 Acquired absence of right foot: Secondary | ICD-10-CM

## 2021-10-11 NOTE — Progress Notes (Signed)
? ?Post-Op Visit Note ?  ?Patient: Valerie Le           ?Date of Birth: [birth year]           ?MRN: [id number] ?Visit Date: 10/11/2021 ?PCP: Maud Deed, PA ? ?Chief Complaint:  ?Chief Complaint  ?Patient presents with  ? Right Foot - Follow-up  ?  06/29/21 revision right transmet amputation   ? ? ?HPI:  ?HPI ?The patient is a 51 year old woman who presents status post revision right transmetatarsal amputation. Has been doing dial soap cleansing and antibacterial ointment dressing changes. ? ?Ortho Exam ?On examination of the right foot her incision is healed distally. Along the lateral column there is an area of ulceration. This is 15 mm in diameter with surrounding callus. This was debrided with a 10 blade back to viable tissue. 3 mm of depth. No erythema or depth. ? ?Visit Diagnoses: No diagnosis found. ? ?Plan:  She will continue with daily Dial soap cleansing. Antibacterial ointment dressings. Cut out area of her orthotic to offload pressure. ?. ?Follow-Up Instructions: No follow-ups on file.  ? ?Imaging: ?No results found. ? ?Orders:  ?No orders of the defined types were placed in this encounter. ? ?No orders of the defined types were placed in this encounter. ? ? ? ?PMFS History: ?Patient Active Problem List  ? Diagnosis Date Noted  ? Medication monitoring encounter 06/15/2021  ? Smoking 02/01/2021  ? Acute hematogenous osteomyelitis of right foot (HCC) 12/22/2020  ? Osteomyelitis of foot, right, acute (HCC)   ? Dehiscence of amputation stump (HCC)   ? Diabetic foot infection (HCC) 12/02/2020  ? Essential hypertension 12/02/2020  ? Hyperlipidemia associated with type 2 diabetes mellitus (HCC) 08/21/2018  ? Bipolar affective disorder, currently depressed, moderate (HCC) 01/23/2017  ? Morbid obesity due to excess calories (HCC) 02/15/2016  ? Insulin-treated type 2 diabetes mellitus (HCC) 03/07/2013  ? Asthma 03/07/2013  ? ADHD (attention deficit hyperactivity disorder) 03/07/2013  ? Migraine  03/07/2013  ? Tobacco abuse 03/07/2013  ? ?Past Medical History:  ?Diagnosis Date  ? Anemia   ? Asthma   ? as a child  ? Depression   ? Diabetes mellitus without complication (HCC)   ? Type 1 - Has Insulin Pump  ? Headache   ? migraines  ? High cholesterol   ? Hypertension   ?  ?No family history on file.  ?Past Surgical History:  ?Procedure Laterality Date  ? AMPUTATION Right 12/22/2020  ? Procedure: RIGHT TRANSMETATARSAL AMPUTATION;  Surgeon: [name], MD;  Location: [facility] OR;  Service: Orthopedics;  Laterality: Right;  ? AMPUTATION Right 06/29/2021  ? Procedure: REVISION RIGHT TRANSMETATARSAL AMPUTATION;  Surgeon: [name], MD;  Location: [facility] OR;  Service: Orthopedics;  Laterality: Right;  ? AMPUTATION TOE Right 12/04/2020  ? Procedure: AMPUTATION RIGHT SECOND TOE, I&D FOOT;  Surgeon: Eldred Manges, MD;  Location: [facility] ORS;  Service: Orthopedics;  Laterality: Right;  Right second toe amputation, I&D right foot.  ? birth control implant  07/31/2006  ? Essure Implant  ? TONSILLECTOMY    ? TYMPANOSTOMY TUBE PLACEMENT    ? WISDOM TOOTH EXTRACTION    ? ?Social History  ? ?Occupational History  ? Not on file  ?Tobacco Use  ? Smoking status: Every Day  ?  Packs/day: 0.25  ?  Years: 25.00  ?  Pack years: 6.25  ?  Types: Cigarettes  ? Smokeless tobacco: Never  ? Tobacco comments:  ?  Cut down on smoking  while on Chantix-  ?Vaping Use  ? Vaping Use: Never used  ?Substance and Sexual Activity  ? Alcohol use: Not Currently  ?  Comment: Once a year on New Year's Day  ? Drug use: Not Currently  ? Sexual activity: Yes  ?  Birth control/protection: Implant  ?  Comment: Essure Implant  ? ? ?

## 2021-10-22 ENCOUNTER — Other Ambulatory Visit: Payer: Self-pay | Admitting: Specialist

## 2021-10-22 MED ORDER — SULFAMETHOXAZOLE-TRIMETHOPRIM 800-160 MG PO TABS: 1.0000 | ORAL_TABLET | Freq: Two times a day (BID) | ORAL | 0 refills | Status: DC

## 2021-10-22 MED ORDER — DOXYCYCLINE HYCLATE 100 MG PO TABS: 100.0000 mg | ORAL_TABLET | Freq: Two times a day (BID) | ORAL | 0 refills | Status: DC

## 2021-10-22 NOTE — Progress Notes (Unsigned)
Patient called concerning her foot post transmet amputation 06/2021, apparently seen 4/25 with new small ulcer at site of surgery. She report the foot is swelling and she has had chills. I told her I can not make a diagnosis over phone but that if she thinks the foot is worsening, it probably is. No fever one episode of a chill, and she has some pain in the foot. Her blood sugars are under control. I told her that lab testing in the hospital ER may be the only way to test and determine how the foot is doing. She wish ?That a medication be called in. Her previous lab show that she had cultures showing staph that was resistent to oxicillin, in the past was taking doxycycline so I sent an Rx for doxycycline and sulfa to her pharmacy and she is to call and be seen in the office next week for further assessment.  ?

## 2021-10-26 ENCOUNTER — Encounter: Payer: Self-pay | Admitting: Family

## 2021-10-26 ENCOUNTER — Ambulatory Visit (INDEPENDENT_AMBULATORY_CARE_PROVIDER_SITE_OTHER): Payer: Managed Care, Other (non HMO) | Admitting: Family

## 2021-10-26 DIAGNOSIS — L02611 Cutaneous abscess of right foot: Secondary | ICD-10-CM

## 2021-10-26 DIAGNOSIS — Z89431 Acquired absence of right foot: Secondary | ICD-10-CM

## 2021-10-26 NOTE — Progress Notes (Signed)
? ?Post-Op Visit Note ?  ?Patient: Valerie Le           ?Date of Birth: 29-Apr-1971           ?MRN: [id number] ?Visit Date: 10/26/2021 ?PCP: Maud Deed, PA ? ?Chief Complaint:  ?Chief Complaint  ?Patient presents with  ? Right Foot - Follow-up  ?  06/29/21 revision transmet amputation   ? ? ?HPI:  ?HPI ?The patient is a 51 year old woman who presents today for concern of worsening to an ulcer over the lateral end of her transmetatarsal incision this has been very slow to heal unfortunately now she has extension to the plantar aspect of her foot she has been having drainage with a foul order she was placed on doxycycline as well as Bactrim on May 6 ? ?Has not had any fever or chills.  No fever. ? ?Ortho Exam ?On examination of the right foot over the lateral column she has an open ulcer this is 15 mm in diameter and filled in with a necrotic tissue this probes 8 mm deep does not probe to bone or tendon however the ulcer now extends beneath the entire plantar aspect of her foot there is a blister this has not been unroofed I lanced this with a 10 blade knife after informed consent.  There is purulent drainage this was expressed.  There is no ascending cellulitis ? ?Visit Diagnoses: No diagnosis found. ? ?Plan: She will continue with her course of Bactrim and doxycycline.  I have asked her to nonweightbear on the right foot we will give her a note to be out of work.  She will follow-up in the office in about 5 days with Dr. Lajoyce Corners ? ?Voiced concern for deep infection concern for the need for further limb salvage surgery. ? ?Follow-Up Instructions: No follow-ups on file.  ? ?Imaging: ?No results found. ? ?Orders:  ?No orders of the defined types were placed in this encounter. ? ?No orders of the defined types were placed in this encounter. ? ? ? ?PMFS History: ?Patient Active Problem List  ? Diagnosis Date Noted  ? Medication monitoring encounter 06/15/2021  ? Smoking 02/01/2021  ? Acute hematogenous  osteomyelitis of right foot (HCC) 12/22/2020  ? Osteomyelitis of foot, right, acute (HCC)   ? Dehiscence of amputation stump (HCC)   ? Diabetic foot infection (HCC) 12/02/2020  ? Essential hypertension 12/02/2020  ? Hyperlipidemia associated with type 2 diabetes mellitus (HCC) 08/21/2018  ? Bipolar affective disorder, currently depressed, moderate (HCC) 01/23/2017  ? Morbid obesity due to excess calories (HCC) 02/15/2016  ? Insulin-treated type 2 diabetes mellitus (HCC) 03/07/2013  ? Asthma 03/07/2013  ? ADHD (attention deficit hyperactivity disorder) 03/07/2013  ? Migraine 03/07/2013  ? Tobacco abuse 03/07/2013  ? ?Past Medical History:  ?Diagnosis Date  ? Anemia   ? Asthma   ? as a child  ? Depression   ? Diabetes mellitus without complication (HCC)   ? Type 1 - Has Insulin Pump  ? Headache   ? migraines  ? High cholesterol   ? Hypertension   ?  ?History reviewed. No pertinent family history.  ?Past Surgical History:  ?Procedure Laterality Date  ? AMPUTATION Right 12/22/2020  ? Procedure: RIGHT TRANSMETATARSAL AMPUTATION;  Surgeon: [name], MD;  Location: [facility] OR;  Service: Orthopedics;  Laterality: Right;  ? AMPUTATION Right 06/29/2021  ? Procedure: REVISION RIGHT TRANSMETATARSAL AMPUTATION;  Surgeon: [name], MD;  Location: [facility] OR;  Service: Orthopedics;  Laterality: Right;  ?  AMPUTATION TOE Right 12/04/2020  ? Procedure: AMPUTATION RIGHT SECOND TOE, I&D FOOT;  Surgeon: Eldred Manges, MD;  Location: [facility] ORS;  Service: Orthopedics;  Laterality: Right;  Right second toe amputation, I&D right foot.  ? birth control implant  07/31/2006  ? Essure Implant  ? TONSILLECTOMY    ? TYMPANOSTOMY TUBE PLACEMENT    ? WISDOM TOOTH EXTRACTION    ? ?Social History  ? ?Occupational History  ? Not on file  ?Tobacco Use  ? Smoking status: Every Day  ?  Packs/day: 0.25  ?  Years: 25.00  ?  Pack years: 6.25  ?  Types: Cigarettes  ? Smokeless tobacco: Never  ? Tobacco comments:  ?  Cut down on smoking while on Chantix-   ?Vaping Use  ? Vaping Use: Never used  ?Substance and Sexual Activity  ? Alcohol use: Not Currently  ?  Comment: Once a year on New Year's Day  ? Drug use: Not Currently  ? Sexual activity: Yes  ?  Birth control/protection: Implant  ?  Comment: Essure Implant  ? ? ?

## 2021-10-28 ENCOUNTER — Ambulatory Visit: Payer: Managed Care, Other (non HMO) | Admitting: Family

## 2021-11-01 ENCOUNTER — Emergency Department (HOSPITAL_COMMUNITY): Payer: Managed Care, Other (non HMO)

## 2021-11-01 ENCOUNTER — Other Ambulatory Visit: Payer: Self-pay

## 2021-11-01 ENCOUNTER — Inpatient Hospital Stay (HOSPITAL_COMMUNITY)
Admission: EM | Admit: 2021-11-01 | Discharge: 2021-11-04 | DRG: 315 | Disposition: A | Discharge status: Home / Self Care | Payer: Managed Care, Other (non HMO) | Attending: Family Medicine | Admitting: Family Medicine

## 2021-11-01 ENCOUNTER — Encounter (HOSPITAL_COMMUNITY): Payer: Self-pay | Admitting: Emergency Medicine

## 2021-11-01 DIAGNOSIS — R509 Fever, unspecified: Secondary | ICD-10-CM | POA: Diagnosis present

## 2021-11-01 DIAGNOSIS — D500 Iron deficiency anemia secondary to blood loss (chronic): Secondary | ICD-10-CM | POA: Diagnosis present

## 2021-11-01 DIAGNOSIS — E1069 Type 1 diabetes mellitus with other specified complication: Secondary | ICD-10-CM | POA: Diagnosis present

## 2021-11-01 DIAGNOSIS — I5A Non-ischemic myocardial injury (non-traumatic): Principal | ICD-10-CM | POA: Diagnosis present

## 2021-11-01 DIAGNOSIS — Z91018 Allergy to other foods: Secondary | ICD-10-CM

## 2021-11-01 DIAGNOSIS — E109 Type 1 diabetes mellitus without complications: Secondary | ICD-10-CM | POA: Diagnosis present

## 2021-11-01 DIAGNOSIS — R079 Chest pain, unspecified: Secondary | ICD-10-CM | POA: Diagnosis not present

## 2021-11-01 DIAGNOSIS — Z7982 Long term (current) use of aspirin: Secondary | ICD-10-CM

## 2021-11-01 DIAGNOSIS — M86171 Other acute osteomyelitis, right ankle and foot: Secondary | ICD-10-CM | POA: Diagnosis present

## 2021-11-01 DIAGNOSIS — J45909 Unspecified asthma, uncomplicated: Secondary | ICD-10-CM | POA: Diagnosis present

## 2021-11-01 DIAGNOSIS — E785 Hyperlipidemia, unspecified: Secondary | ICD-10-CM | POA: Diagnosis present

## 2021-11-01 DIAGNOSIS — Z794 Long term (current) use of insulin: Secondary | ICD-10-CM

## 2021-11-01 DIAGNOSIS — Z79899 Other long term (current) drug therapy: Secondary | ICD-10-CM

## 2021-11-01 DIAGNOSIS — F17200 Nicotine dependence, unspecified, uncomplicated: Secondary | ICD-10-CM | POA: Diagnosis present

## 2021-11-01 DIAGNOSIS — I10 Essential (primary) hypertension: Secondary | ICD-10-CM | POA: Diagnosis present

## 2021-11-01 DIAGNOSIS — Z888 Allergy status to other drugs, medicaments and biological substances status: Secondary | ICD-10-CM

## 2021-11-01 DIAGNOSIS — D509 Iron deficiency anemia, unspecified: Secondary | ICD-10-CM | POA: Diagnosis present

## 2021-11-01 DIAGNOSIS — R6510 Systemic inflammatory response syndrome (SIRS) of non-infectious origin without acute organ dysfunction: Secondary | ICD-10-CM | POA: Diagnosis present

## 2021-11-01 DIAGNOSIS — D75839 Thrombocytosis, unspecified: Secondary | ICD-10-CM | POA: Diagnosis present

## 2021-11-01 DIAGNOSIS — L03115 Cellulitis of right lower limb: Secondary | ICD-10-CM | POA: Diagnosis present

## 2021-11-01 DIAGNOSIS — F17210 Nicotine dependence, cigarettes, uncomplicated: Secondary | ICD-10-CM | POA: Diagnosis present

## 2021-11-01 DIAGNOSIS — Z89421 Acquired absence of other right toe(s): Secondary | ICD-10-CM

## 2021-11-01 DIAGNOSIS — Z7951 Long term (current) use of inhaled steroids: Secondary | ICD-10-CM

## 2021-11-01 DIAGNOSIS — L089 Local infection of the skin and subcutaneous tissue, unspecified: Secondary | ICD-10-CM | POA: Diagnosis present

## 2021-11-01 DIAGNOSIS — Z9641 Presence of insulin pump (external) (internal): Secondary | ICD-10-CM | POA: Diagnosis present

## 2021-11-01 DIAGNOSIS — G43909 Migraine, unspecified, not intractable, without status migrainosus: Secondary | ICD-10-CM | POA: Diagnosis present

## 2021-11-01 DIAGNOSIS — E876 Hypokalemia: Secondary | ICD-10-CM | POA: Diagnosis present

## 2021-11-01 DIAGNOSIS — Z720 Tobacco use: Secondary | ICD-10-CM | POA: Diagnosis present

## 2021-11-01 DIAGNOSIS — Z91040 Latex allergy status: Secondary | ICD-10-CM

## 2021-11-01 DIAGNOSIS — M869 Osteomyelitis, unspecified: Secondary | ICD-10-CM

## 2021-11-01 DIAGNOSIS — E10628 Type 1 diabetes mellitus with other skin complications: Secondary | ICD-10-CM | POA: Diagnosis present

## 2021-11-01 DIAGNOSIS — E1042 Type 1 diabetes mellitus with diabetic polyneuropathy: Secondary | ICD-10-CM | POA: Diagnosis present

## 2021-11-01 DIAGNOSIS — E7800 Pure hypercholesterolemia, unspecified: Secondary | ICD-10-CM | POA: Diagnosis present

## 2021-11-01 DIAGNOSIS — R778 Other specified abnormalities of plasma proteins: Secondary | ICD-10-CM | POA: Diagnosis present

## 2021-11-01 DIAGNOSIS — T8140XA Infection following a procedure, unspecified, initial encounter: Secondary | ICD-10-CM | POA: Diagnosis present

## 2021-11-01 DIAGNOSIS — E8809 Other disorders of plasma-protein metabolism, not elsewhere classified: Secondary | ICD-10-CM | POA: Diagnosis present

## 2021-11-01 DIAGNOSIS — Z6837 Body mass index (BMI) 37.0-37.9, adult: Secondary | ICD-10-CM

## 2021-11-01 DIAGNOSIS — L97519 Non-pressure chronic ulcer of other part of right foot with unspecified severity: Secondary | ICD-10-CM | POA: Diagnosis present

## 2021-11-01 DIAGNOSIS — E669 Obesity, unspecified: Secondary | ICD-10-CM | POA: Diagnosis present

## 2021-11-01 DIAGNOSIS — E10621 Type 1 diabetes mellitus with foot ulcer: Secondary | ICD-10-CM | POA: Diagnosis present

## 2021-11-01 LAB — BASIC METABOLIC PANEL
Anion gap: 9 (ref 5–15)
BUN: 12 mg/dL (ref 6–20)
CO2: 23 mmol/L (ref 22–32)
Calcium: 9.2 mg/dL (ref 8.9–10.3)
Chloride: 105 mmol/L (ref 98–111)
Creatinine, Ser: 1 mg/dL (ref 0.44–1.00)
GFR, Estimated: >60 mL/min (ref >60)
Glucose, Bld: 145 mg/dL — ABNORMAL HIGH (ref 70–99)
Potassium: 4.5 mmol/L (ref 3.5–5.1)
Sodium: 137 mmol/L (ref 135–145)

## 2021-11-01 LAB — CBC
HCT: 31.4 % — ABNORMAL LOW (ref 36.0–46.0)
Hemoglobin: 9.9 g/dL — ABNORMAL LOW (ref 12.0–15.0)
MCH: 22.4 pg — ABNORMAL LOW (ref 26.0–34.0)
MCHC: 31.5 g/dL (ref 30.0–36.0)
MCV: 71.2 fL — ABNORMAL LOW (ref 80.0–100.0)
Platelets: 431 10*3/uL — ABNORMAL HIGH (ref 150–400)
RBC: 4.41 MIL/uL (ref 3.87–5.11)
RDW: 18.1 % — ABNORMAL HIGH (ref 11.5–15.5)
WBC: 20.2 10*3/uL — ABNORMAL HIGH (ref 4.0–10.5)
nRBC: 0 % (ref 0.0–0.2)

## 2021-11-01 LAB — TROPONIN I (HIGH SENSITIVITY): Troponin I (High Sensitivity): 18 ng/L — ABNORMAL HIGH (ref <18)

## 2021-11-01 IMAGING — CR DG CHEST 2V
2 series · 2 of 2 positions shown · non-contrast
Comparison: None Available.

CLINICAL DATA: Chest pain, shortness of breath

EXAM:
CHEST - 2 VIEW

[chest lat]
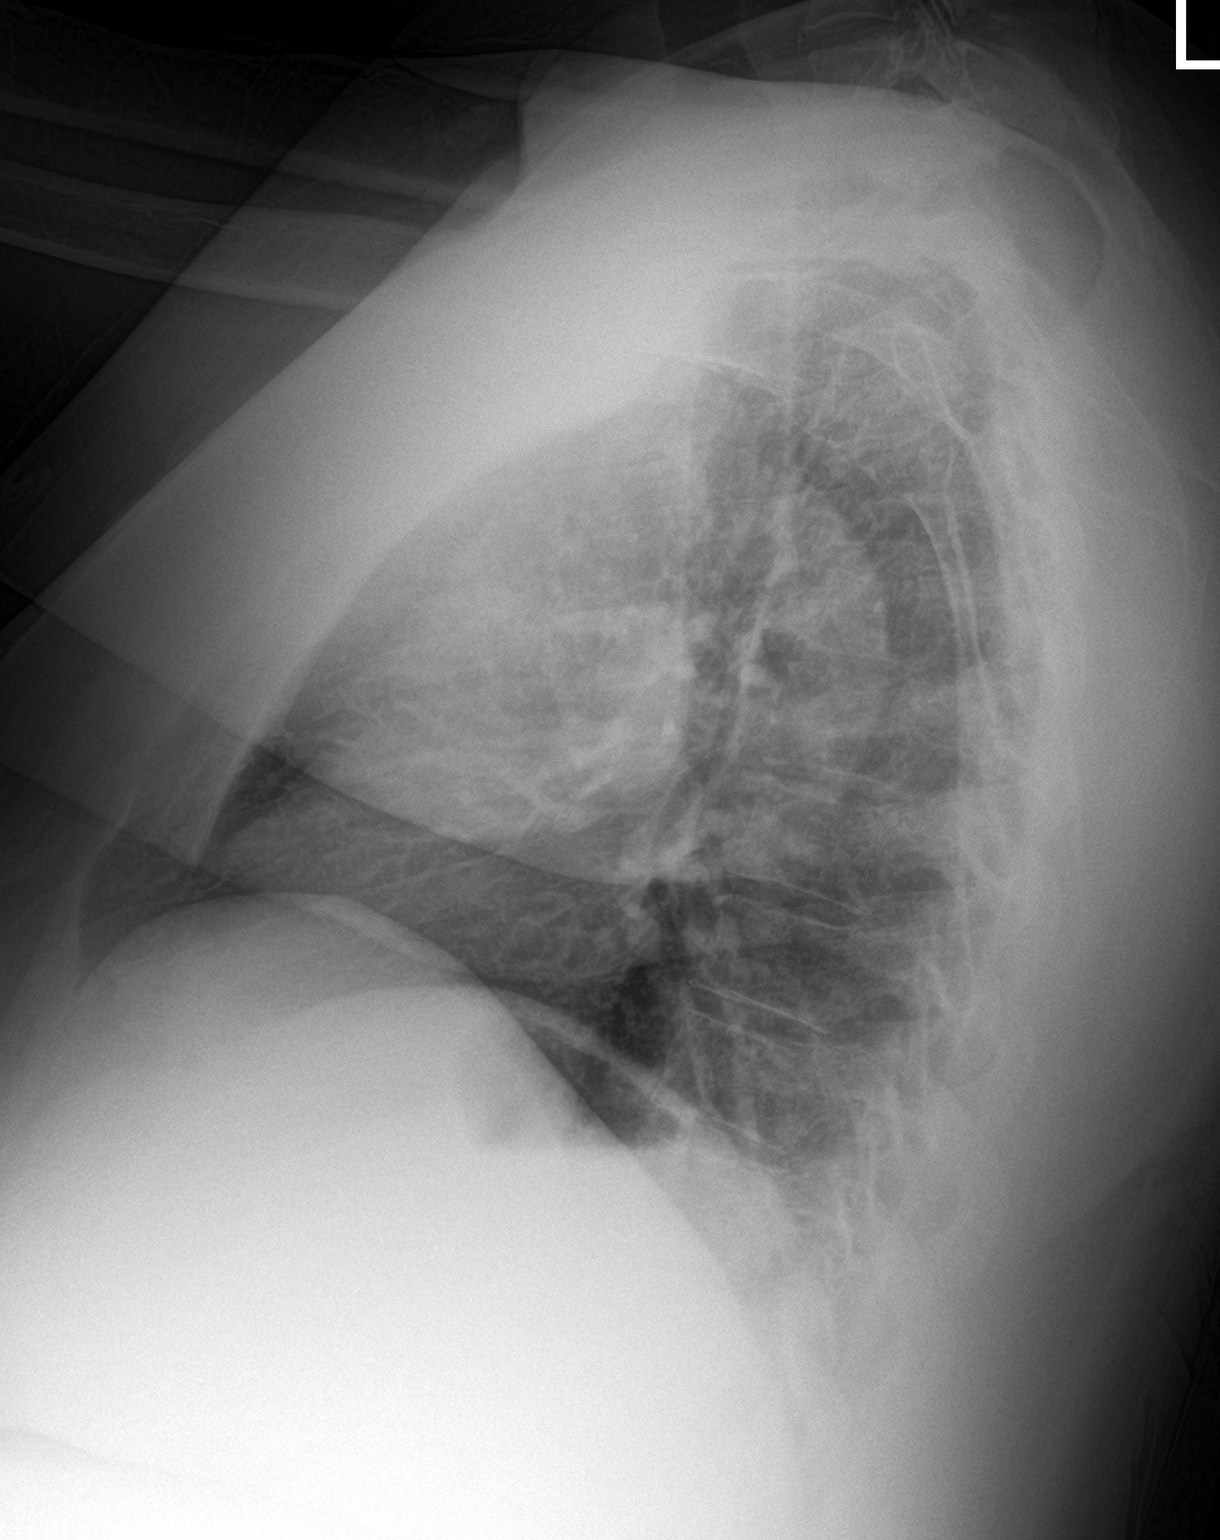

[chest ap]
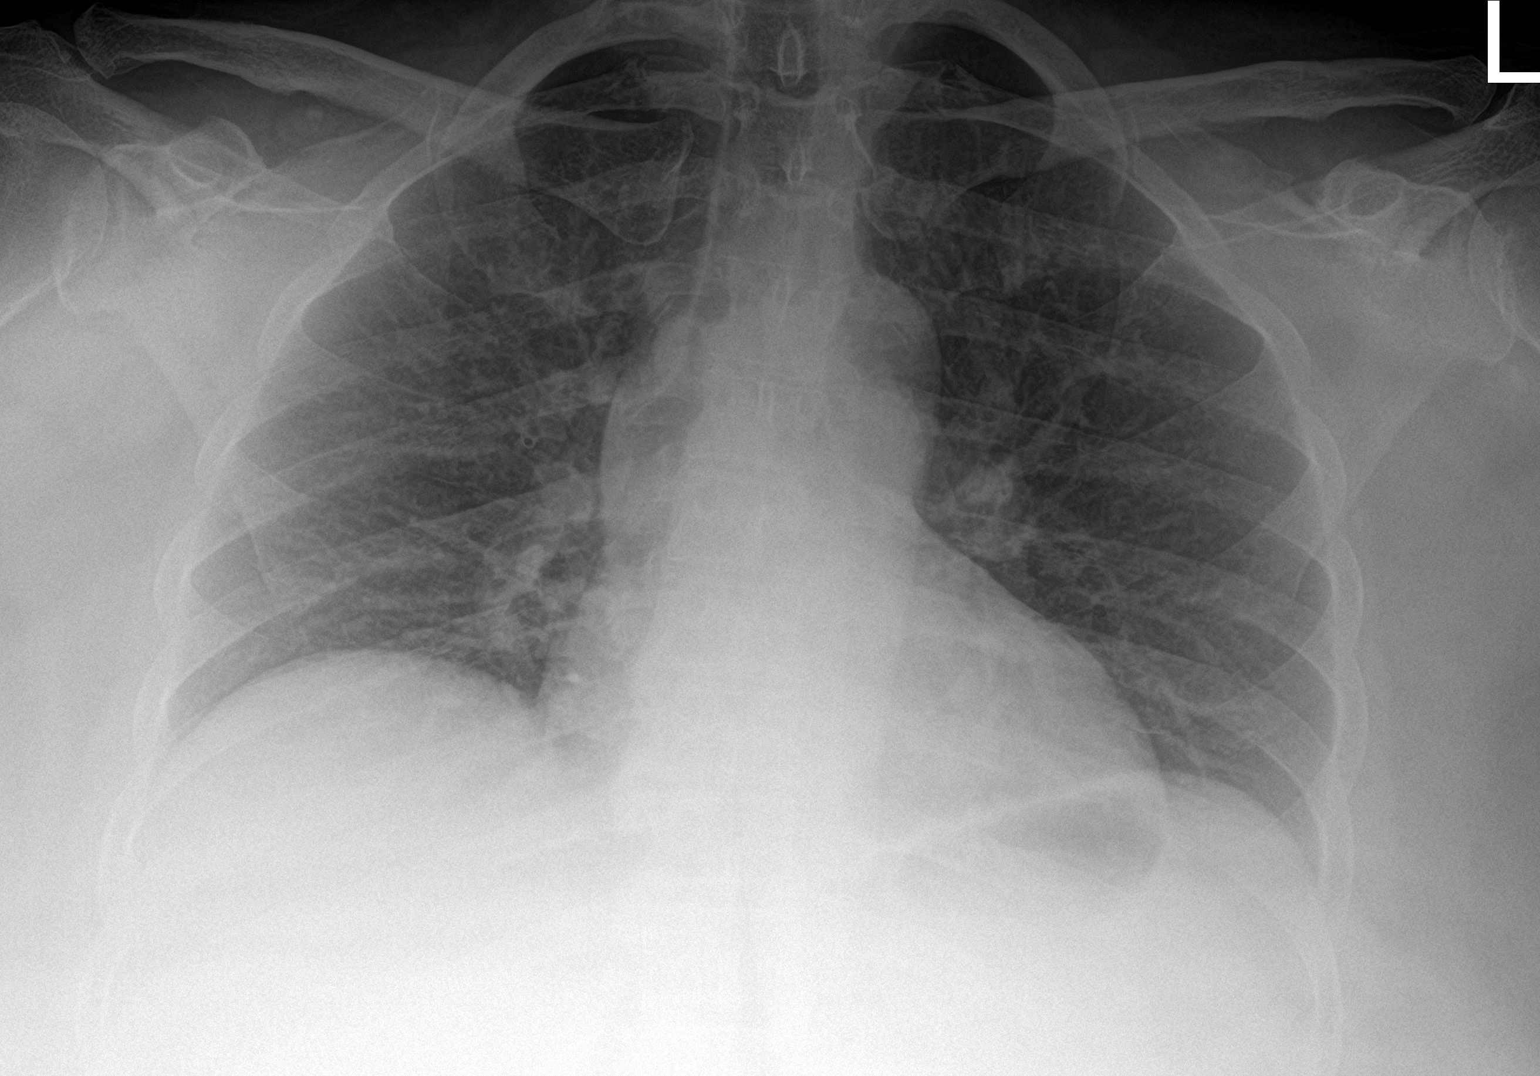

[2 of 2 positions shown; findings below may reference images not displayed]

FINDINGS: Lungs are clear.  No pleural effusion or pneumothorax.

The heart is normal in size.

Visualized osseous structures are within normal limits.
IMPRESSION: Normal chest radiographs.

## 2021-11-01 NOTE — ED Provider Triage Note (Signed)
Emergency Medicine Provider Triage Evaluation Note ? ?Valerie Le , a 51 y.o. female  was evaluated in triage.  Pt complains of centralized chest pain without radiation over the last 4 days.  Tried Tums without relief.  Accompanied with mild intermittent nausea.  Denies shortness of breath.  Hx of asthma.  No Hx of H2 blocker or PPI use.  Denies back pain, vomiting, fever, diarrhea, constipation, abdominal pain, headache, vision changes, numbness/tingling of extremities, weakness. ? ?Hx DM T2, HTN, asthma, ADHD, bipolar, migraines, tobacco use, hyperlipidemia. ? ?Review of Systems  ?Positive: As above ?Negative: As above ? ?Physical Exam  ?BP 127/73   Pulse 99   Temp 98.2 ?F (36.8 ?C) (Oral)   Resp 20   Ht 5\' 11"  (1.803 m)   Wt 120.2 kg   SpO2 98%   BMI 36.96 kg/m?  ?Gen:   Awake, no distress, sitting comfortably ?Resp:  Normal effort, CTAB ?MSK:   Moves extremities without difficulty  ?Other:  Abdomen soft nontender.  Chest mildly TTP.  Radial and PT pulses 2+.  No back or CVA tenderness.  No significant lower extremity edema. ? ?Medical Decision Making  ?Medically screening exam initiated at 9:01 PM.  Appropriate orders placed.  Valerie Le was informed that the remainder of the evaluation will be completed by another provider, this initial triage assessment does not replace that evaluation, and the importance of remaining in the ED until their evaluation is complete. ? ?Labs, imaging, EKG ordered. ?  ?Prince Rome, PA-C ?[id number] ? ?

## 2021-11-01 NOTE — ED Triage Notes (Signed)
Pt c/o left sided chest pain, acid reflux and nausea x 4 days  ?

## 2021-11-02 ENCOUNTER — Observation Stay (HOSPITAL_COMMUNITY): Payer: Managed Care, Other (non HMO)

## 2021-11-02 ENCOUNTER — Emergency Department (HOSPITAL_COMMUNITY): Payer: Managed Care, Other (non HMO)

## 2021-11-02 ENCOUNTER — Inpatient Hospital Stay (HOSPITAL_COMMUNITY): Payer: Managed Care, Other (non HMO)

## 2021-11-02 DIAGNOSIS — D509 Iron deficiency anemia, unspecified: Secondary | ICD-10-CM

## 2021-11-02 DIAGNOSIS — M86171 Other acute osteomyelitis, right ankle and foot: Secondary | ICD-10-CM | POA: Diagnosis present

## 2021-11-02 DIAGNOSIS — E1069 Type 1 diabetes mellitus with other specified complication: Secondary | ICD-10-CM | POA: Diagnosis present

## 2021-11-02 DIAGNOSIS — L97511 Non-pressure chronic ulcer of other part of right foot limited to breakdown of skin: Secondary | ICD-10-CM | POA: Diagnosis not present

## 2021-11-02 DIAGNOSIS — R6510 Systemic inflammatory response syndrome (SIRS) of non-infectious origin without acute organ dysfunction: Secondary | ICD-10-CM | POA: Diagnosis present

## 2021-11-02 DIAGNOSIS — R072 Precordial pain: Secondary | ICD-10-CM | POA: Diagnosis not present

## 2021-11-02 DIAGNOSIS — Z720 Tobacco use: Secondary | ICD-10-CM

## 2021-11-02 DIAGNOSIS — E7800 Pure hypercholesterolemia, unspecified: Secondary | ICD-10-CM | POA: Diagnosis present

## 2021-11-02 DIAGNOSIS — E11628 Type 2 diabetes mellitus with other skin complications: Secondary | ICD-10-CM | POA: Diagnosis not present

## 2021-11-02 DIAGNOSIS — R778 Other specified abnormalities of plasma proteins: Secondary | ICD-10-CM | POA: Diagnosis not present

## 2021-11-02 DIAGNOSIS — E8809 Other disorders of plasma-protein metabolism, not elsewhere classified: Secondary | ICD-10-CM | POA: Diagnosis present

## 2021-11-02 DIAGNOSIS — E876 Hypokalemia: Secondary | ICD-10-CM | POA: Diagnosis present

## 2021-11-02 DIAGNOSIS — J45909 Unspecified asthma, uncomplicated: Secondary | ICD-10-CM | POA: Diagnosis present

## 2021-11-02 DIAGNOSIS — Z888 Allergy status to other drugs, medicaments and biological substances status: Secondary | ICD-10-CM | POA: Diagnosis not present

## 2021-11-02 DIAGNOSIS — D500 Iron deficiency anemia secondary to blood loss (chronic): Secondary | ICD-10-CM | POA: Diagnosis present

## 2021-11-02 DIAGNOSIS — I10 Essential (primary) hypertension: Secondary | ICD-10-CM | POA: Diagnosis present

## 2021-11-02 DIAGNOSIS — T8140XA Infection following a procedure, unspecified, initial encounter: Secondary | ICD-10-CM | POA: Diagnosis present

## 2021-11-02 DIAGNOSIS — R079 Chest pain, unspecified: Secondary | ICD-10-CM | POA: Diagnosis present

## 2021-11-02 DIAGNOSIS — D75839 Thrombocytosis, unspecified: Secondary | ICD-10-CM | POA: Diagnosis present

## 2021-11-02 DIAGNOSIS — Z91018 Allergy to other foods: Secondary | ICD-10-CM | POA: Diagnosis not present

## 2021-11-02 DIAGNOSIS — E669 Obesity, unspecified: Secondary | ICD-10-CM | POA: Diagnosis present

## 2021-11-02 DIAGNOSIS — E10628 Type 1 diabetes mellitus with other skin complications: Secondary | ICD-10-CM | POA: Diagnosis present

## 2021-11-02 DIAGNOSIS — E10621 Type 1 diabetes mellitus with foot ulcer: Secondary | ICD-10-CM | POA: Diagnosis present

## 2021-11-02 DIAGNOSIS — E1042 Type 1 diabetes mellitus with diabetic polyneuropathy: Secondary | ICD-10-CM | POA: Diagnosis present

## 2021-11-02 DIAGNOSIS — E109 Type 1 diabetes mellitus without complications: Secondary | ICD-10-CM | POA: Diagnosis present

## 2021-11-02 DIAGNOSIS — F17210 Nicotine dependence, cigarettes, uncomplicated: Secondary | ICD-10-CM | POA: Diagnosis present

## 2021-11-02 DIAGNOSIS — Z91040 Latex allergy status: Secondary | ICD-10-CM | POA: Diagnosis not present

## 2021-11-02 DIAGNOSIS — E785 Hyperlipidemia, unspecified: Secondary | ICD-10-CM

## 2021-11-02 DIAGNOSIS — Z6837 Body mass index (BMI) 37.0-37.9, adult: Secondary | ICD-10-CM | POA: Diagnosis not present

## 2021-11-02 DIAGNOSIS — L03115 Cellulitis of right lower limb: Secondary | ICD-10-CM | POA: Diagnosis present

## 2021-11-02 DIAGNOSIS — I5A Non-ischemic myocardial injury (non-traumatic): Secondary | ICD-10-CM | POA: Diagnosis present

## 2021-11-02 DIAGNOSIS — L97519 Non-pressure chronic ulcer of other part of right foot with unspecified severity: Secondary | ICD-10-CM | POA: Diagnosis present

## 2021-11-02 DIAGNOSIS — Z9641 Presence of insulin pump (external) (internal): Secondary | ICD-10-CM | POA: Diagnosis present

## 2021-11-02 LAB — LIPID PANEL
Cholesterol: 110 mg/dL (ref 0–200)
HDL: 34 mg/dL — ABNORMAL LOW (ref >40)
LDL Cholesterol: 60 mg/dL (ref 0–99)
Total CHOL/HDL Ratio: 3.2 RATIO
Triglycerides: 79 mg/dL (ref <150)
VLDL: 16 mg/dL (ref 0–40)

## 2021-11-02 LAB — ECHOCARDIOGRAM COMPLETE
AR max vel: 1.74 cm2
AV Area VTI: 2.14 cm2
AV Area mean vel: 2.18 cm2
AV Mean grad: 9 mmHg
AV Peak grad: 19.4 mmHg
Ao pk vel: 2.2 m/s
Area-P 1/2: 6.83 cm2
Height: 71 in
S' Lateral: 3.1 cm
Weight: 4240 oz

## 2021-11-02 LAB — GLUCOSE, CAPILLARY
Glucose-Capillary: 142 mg/dL — ABNORMAL HIGH (ref 70–99)
Glucose-Capillary: 184 mg/dL — ABNORMAL HIGH (ref 70–99)

## 2021-11-02 LAB — LACTIC ACID, PLASMA: Lactic Acid, Venous: 0.7 mmol/L (ref 0.5–1.9)

## 2021-11-02 LAB — TROPONIN I (HIGH SENSITIVITY)
Troponin I (High Sensitivity): 5 ng/L (ref <18)
Troponin I (High Sensitivity): 9 ng/L (ref <18)

## 2021-11-02 LAB — SEDIMENTATION RATE: Sed Rate: 84 mm/hr — ABNORMAL HIGH (ref 0–22)

## 2021-11-02 LAB — CBG MONITORING, ED: Glucose-Capillary: 217 mg/dL — ABNORMAL HIGH (ref 70–99)

## 2021-11-02 IMAGING — MR MR FOOT*R* W/O CM
4 of 5 series · 18 of 40 positions shown · non-contrast
Comparison: MRI [DATE]. Radiographs [DATE], [DATE] and
[DATE].

CLINICAL DATA: Diabetic with right foot swelling. History of
partial amputation for osteomyelitis. Transmetatarsal amputation
[DATE] with revision [DATE].

EXAM:
MRI OF THE RIGHT FOREFOOT WITHOUT CONTRAST
TECHNIQUE: Multiplanar, multisequence MR imaging of the right foot was
performed. No intravenous contrast was administered.

[Series 4: T1 · oblique · 5.0mm · 0.27mm/px · 4 of 35 slices shown (1 of 2)]
[im 1/35]
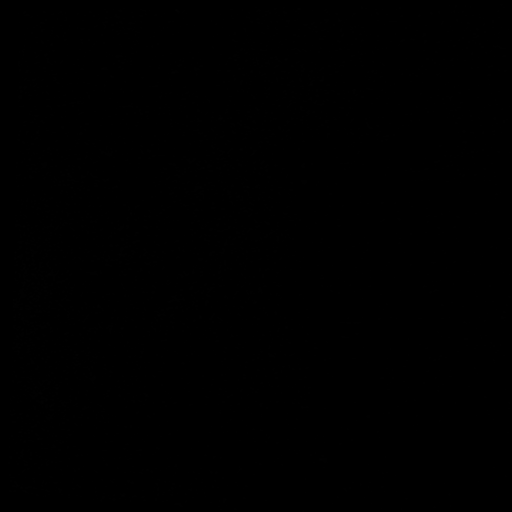
[im 4/35]
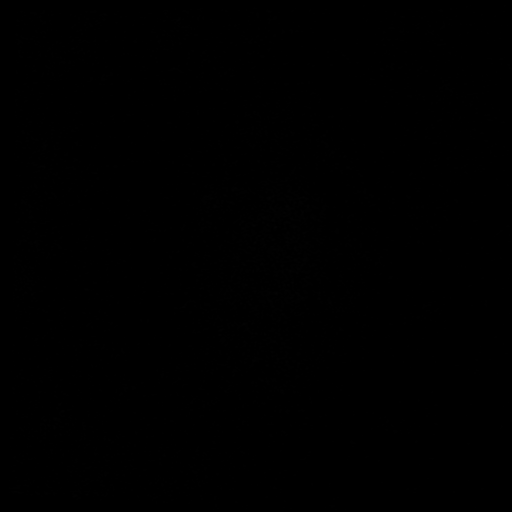
[im 18/35]
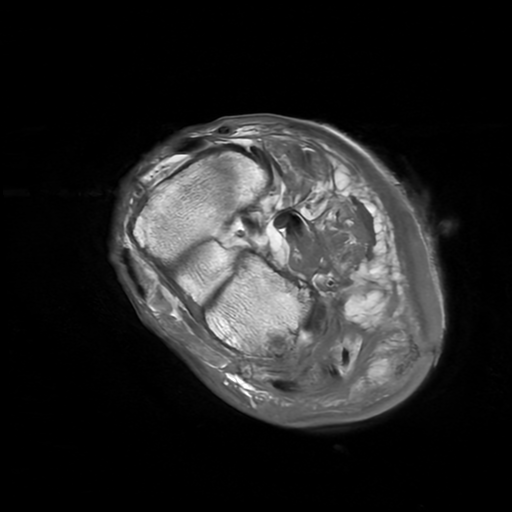
[im 31/35]
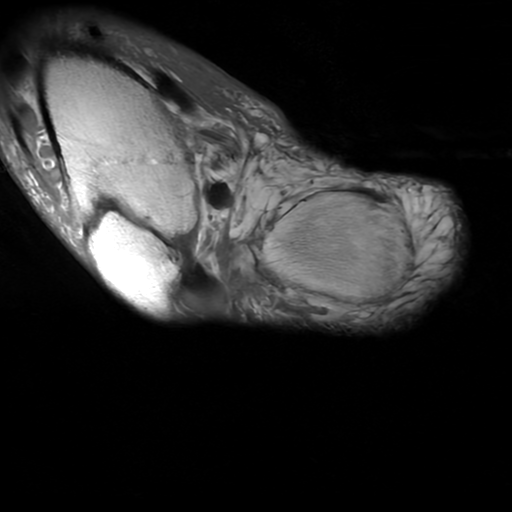

[Series 5: T2 fat-sat · oblique · 5.0mm · 0.27mm/px · 8 of 35 slices shown]
[im 1/35]
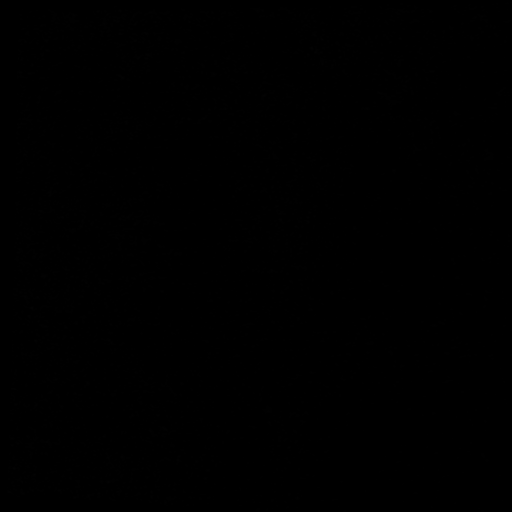
[im 4/35]
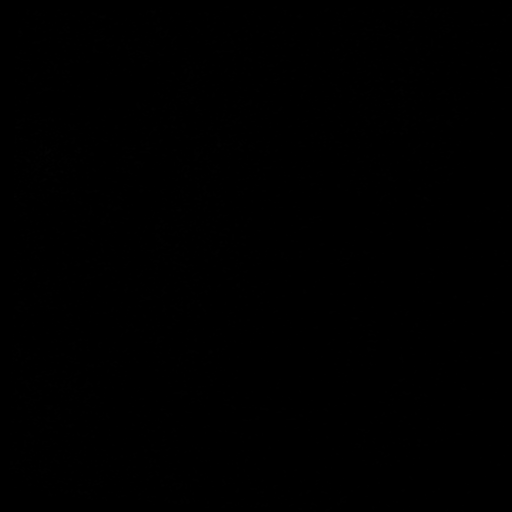
[im 12/35]
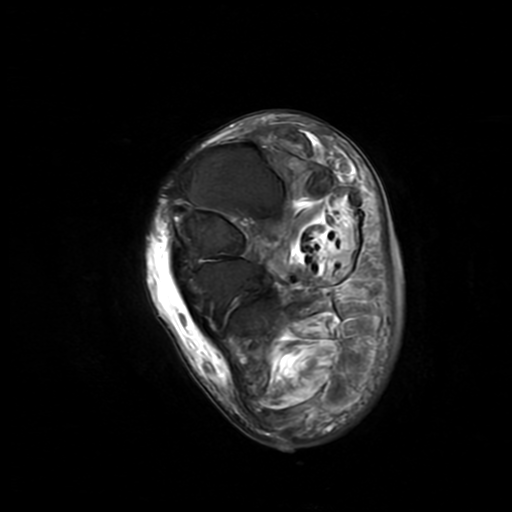
[im 16/35]
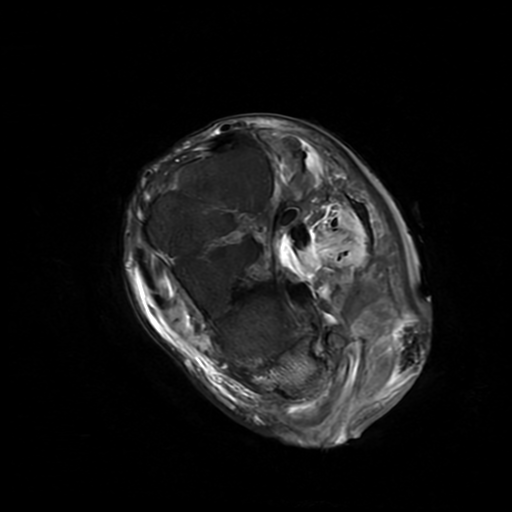
[im 19/35]
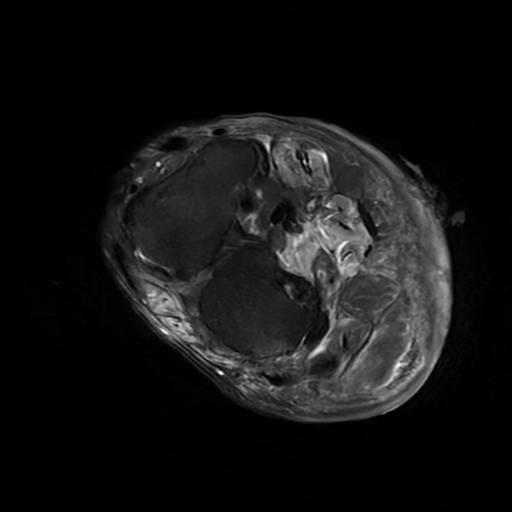
[im 23/35]
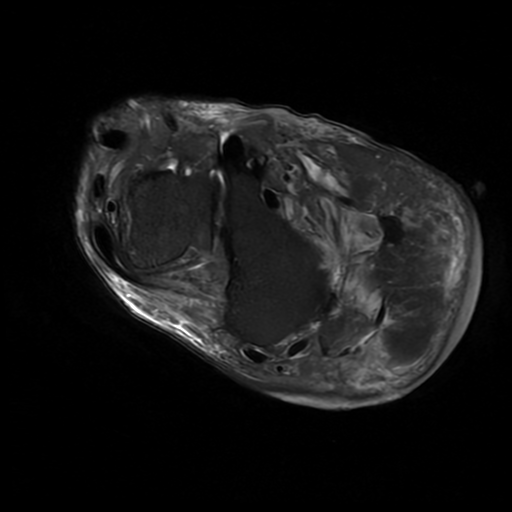
[im 31/35]
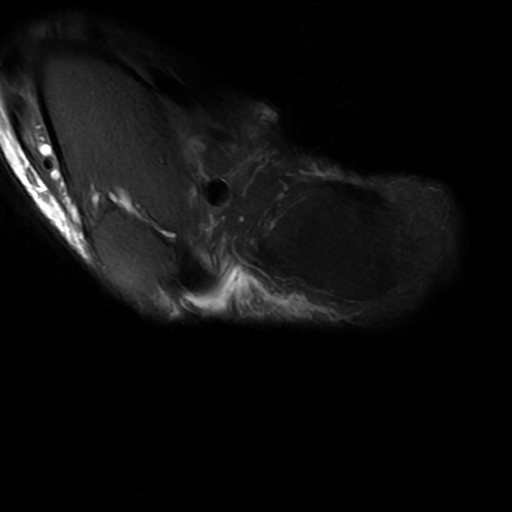
[im 35/35]
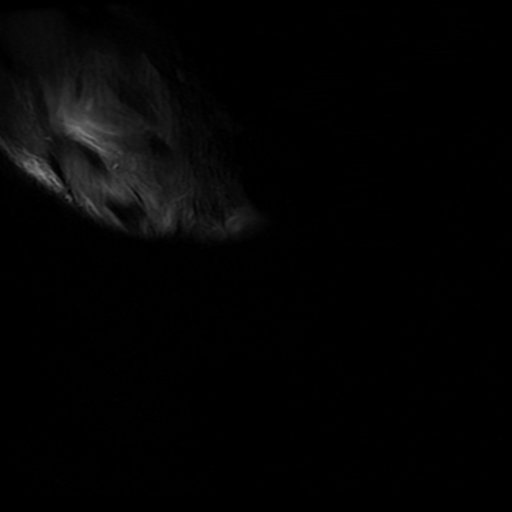

[Series 6: STIR · sagittal · 4.0mm · 0.35mm/px · 3 of 21 slices shown]
[im 5/21]
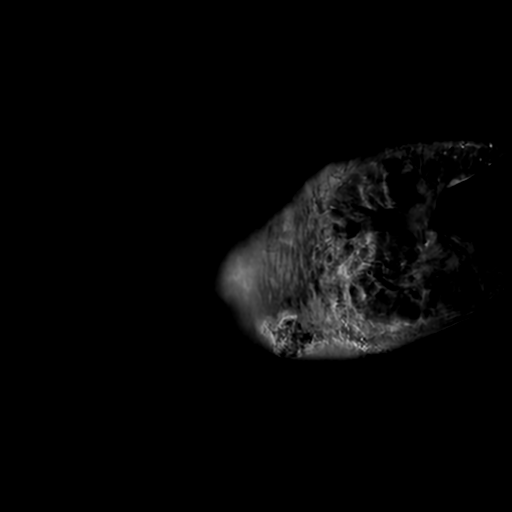
[im 13/21]
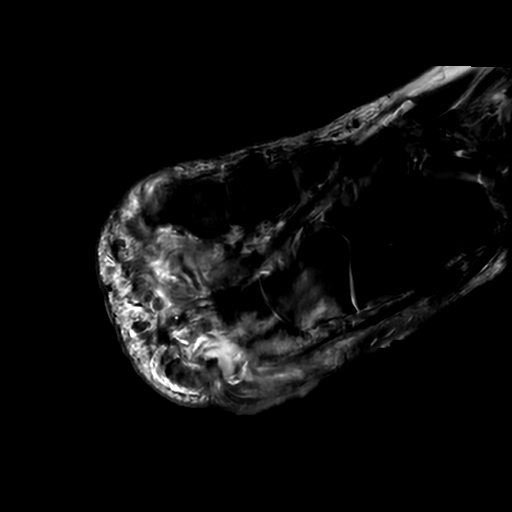
[im 21/21]
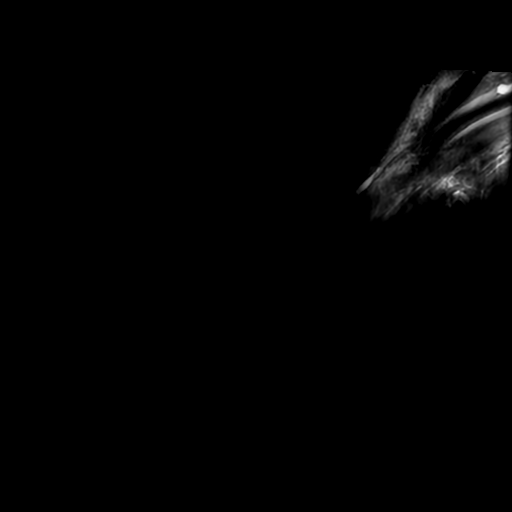

[Series 7: T1 · sagittal · 4.0mm · 0.35mm/px · 3 of 21 slices shown (2 of 2)]
[im 5/21]
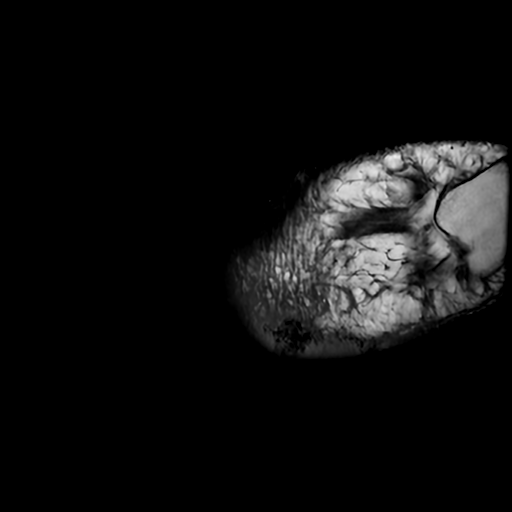
[im 13/21]
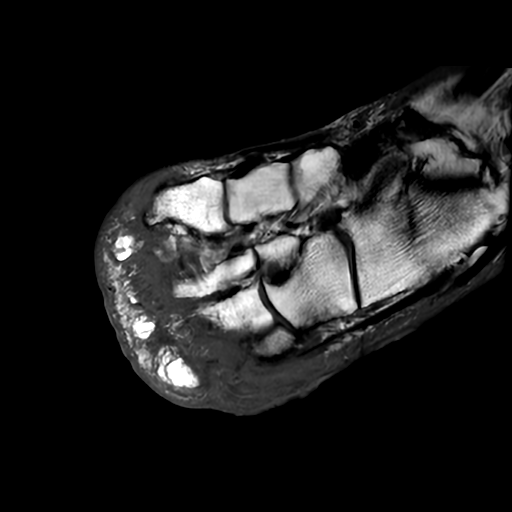
[im 21/21]
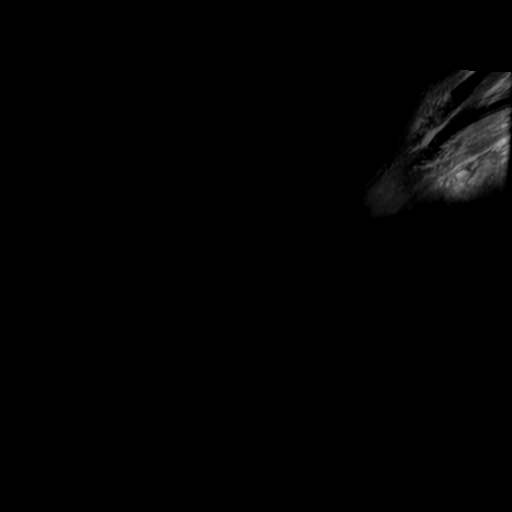

[18 of 40 positions shown; findings below may reference images not displayed]

FINDINGS: Bones/Joint/Cartilage

Examination includes most of the remaining foot status post
transmetatarsal amputation. Compared with the previous MRI, there is
new subchondral marrow T2 hyperintensity and decreased T1 signal
within the base of the 5th metatarsal and the adjacent cuboid. No
gross cortical destruction. There is mild marrow T2 hyperintensity
within the remaining base of the 5th metatarsal and distally within
the remaining 4th metatarsal. No significant abnormalities and the
additional metatarsal bases or tarsal bones. No significant joint
effusions.

Ligaments

Intact Lisfranc ligament.

Muscles and Tendons

Generalized muscular atrophy. Mild T2 hyperintensity surrounding the
flexor tendons in the proximal forefoot. No significant focal
intramuscular fluid collections.

Soft tissues

Plantar soft tissue ulceration laterally along the base of the 5th
metatarsal with underlying emphysema, decreased T1 signal and
heterogeneous T2 hyperintensity in the surrounding soft tissues.
These soft tissue changes abut the 5th tarsometatarsal articulation,
such that the underlying marrow changes are at least moderately
suspicious for osteomyelitis. No drainable soft tissue abscess.
IMPRESSION: 1. Soft tissue ulceration along the plantar base of the 5th
metatarsal with underlying soft tissue emphysema and inflammatory
changes consistent with soft tissue infection. No drainable abscess
identified.
2. Marrow changes in the 5th metatarsal base and adjacent cuboid
have developed from the previous MRI of 11 months ago, and given
proximity to the adjacent soft tissue findings could reflect
osteomyelitis. No cortical destruction or significant joint
effusions.
3. Previous transmetatarsal amputation through the forefoot with
mild nonspecific marrow T2 hyperintensity distally in the remaining
4th and 5th metatarsals.

## 2021-11-02 IMAGING — CR DG FOOT COMPLETE 3+V*R*
3 series · 3 of 3 positions shown · non-contrast
Comparison: [DATE]

CLINICAL DATA: foot infection on plantar surface, prior amputation
for osteo, eval signs of new osteo

EXAM:
RIGHT FOOT COMPLETE - 3+ VIEW

[foot ap]
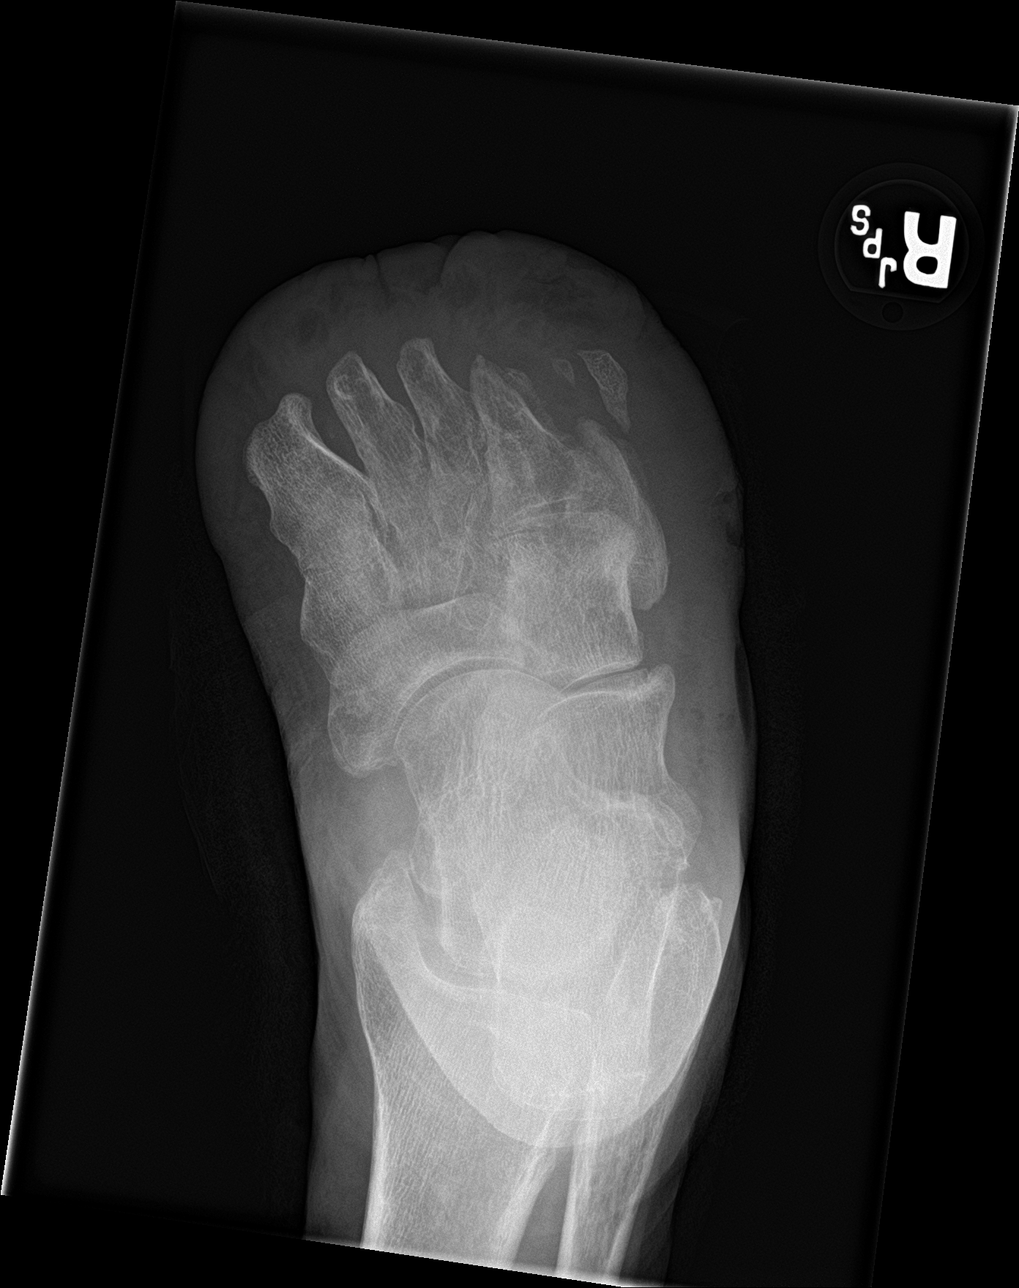

[foot obl]
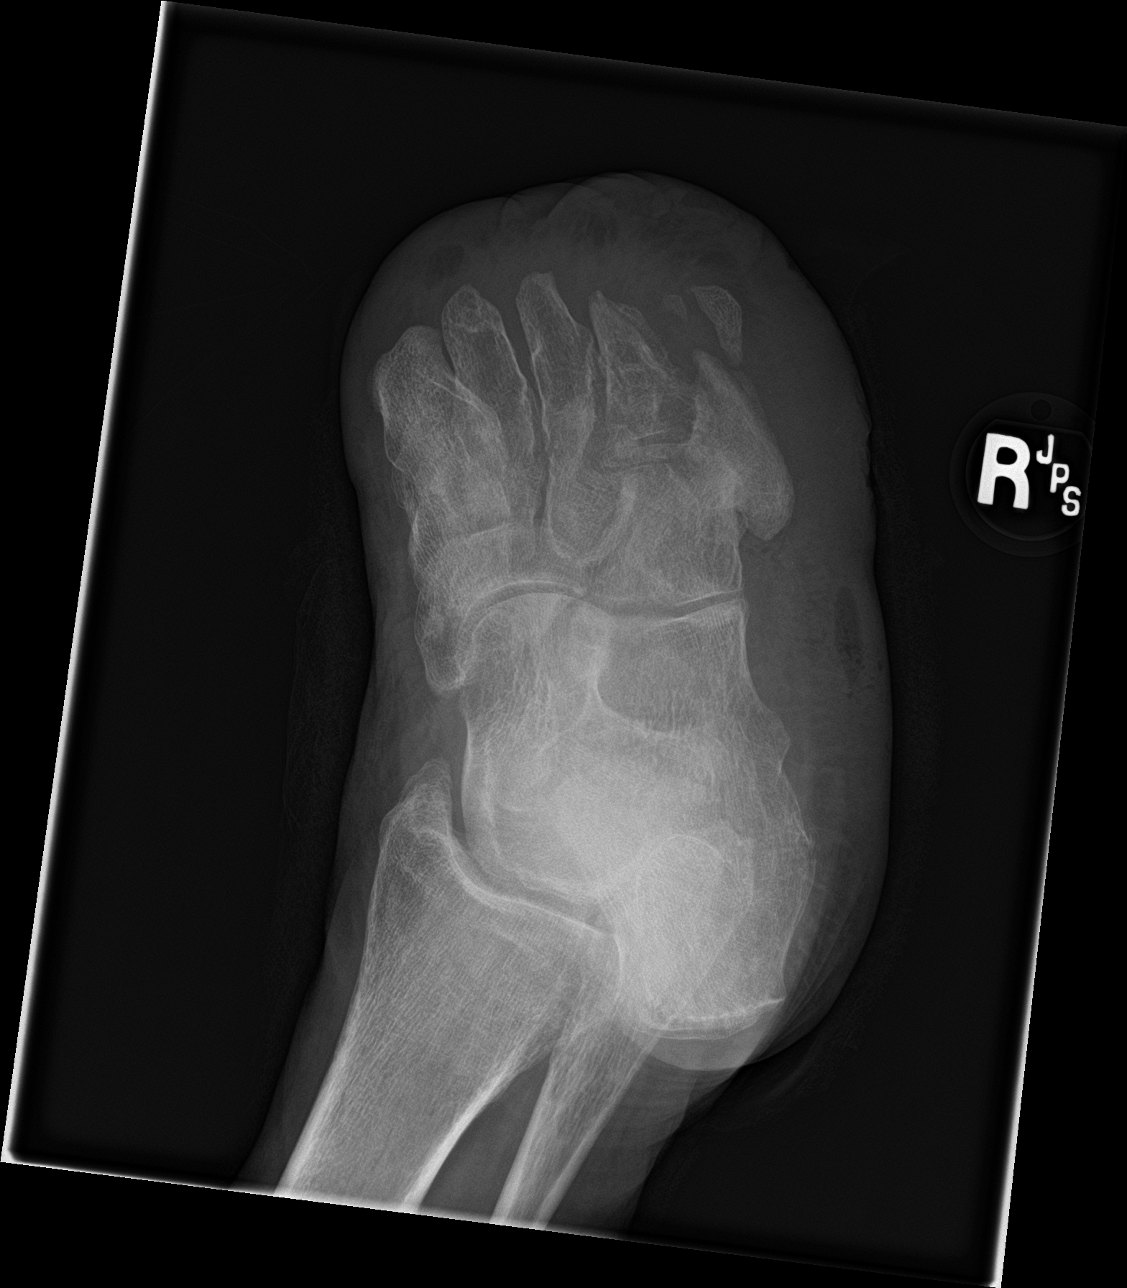

[foot lat]
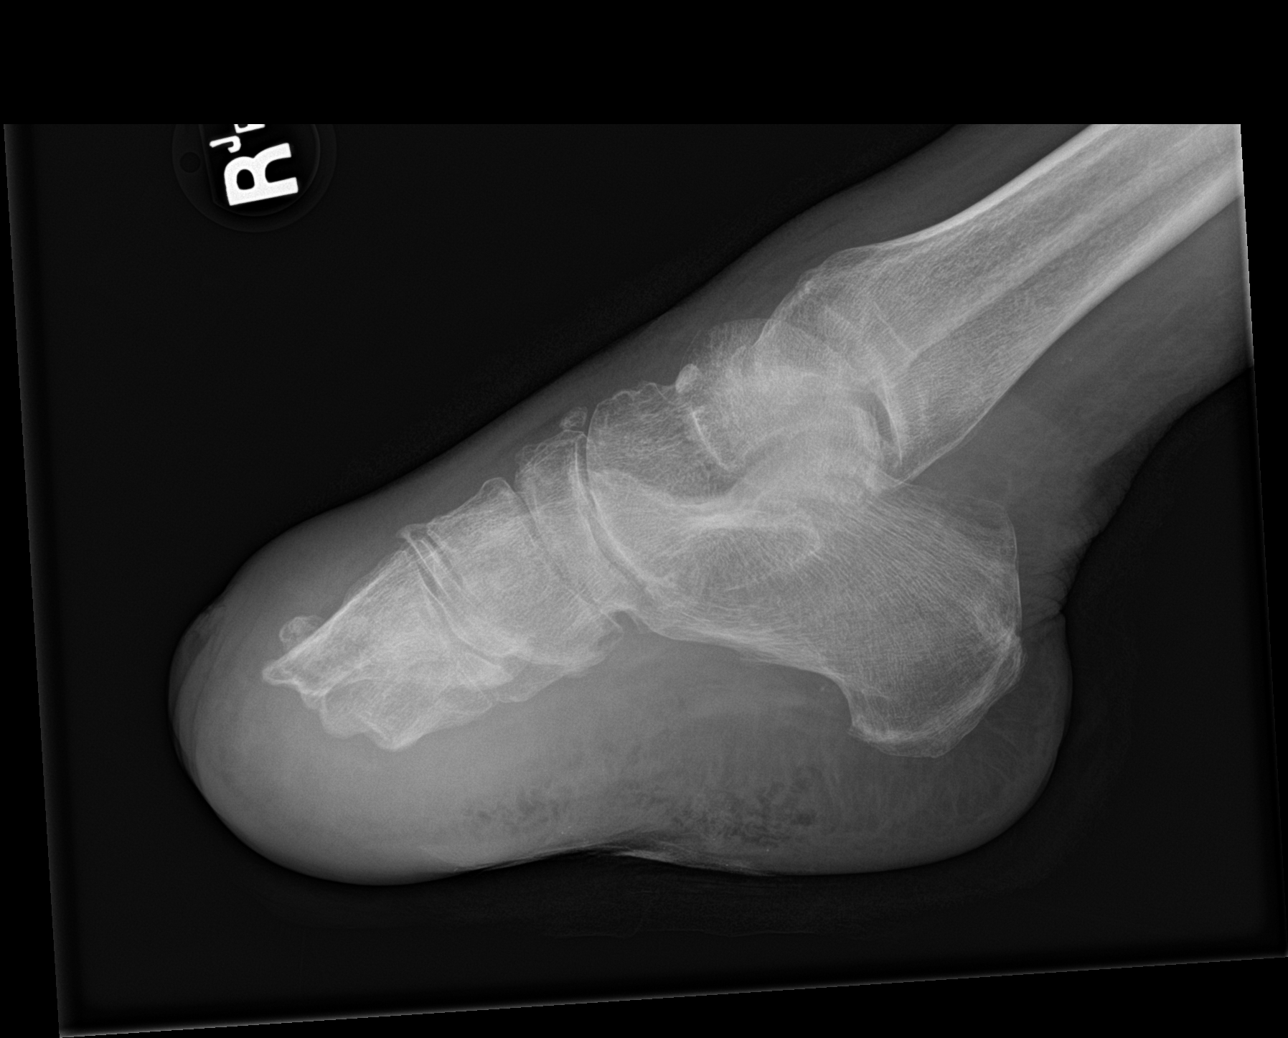

[3 of 3 positions shown; findings below may reference images not displayed]

FINDINGS: Previous transmetatarsal amputation. Interval cortical loss
involving the proximal shaft of the fifth metatarsal. There has been
some subtle cortical loss at the amputation margin of the fourth
metatarsal shaft. There is regional soft tissue swelling. There is
plantar subcutaneous gas at the level of the midfoot.
IMPRESSION: Findings consistent with fourth and fifth metatarsal osteomyelitis.

## 2021-11-02 MED ORDER — INSULIN GLARGINE-YFGN 100 UNIT/ML ~~LOC~~ SOLN
15.0000 [IU] | Freq: Every day | SUBCUTANEOUS | Status: DC
Start: 2021-11-02 — End: 2021-11-04
  Administered 2021-11-02 – 2021-11-04 (×3): 15 [IU] via SUBCUTANEOUS
  Filled 2021-11-02 (×3): qty 0.15

## 2021-11-02 MED ORDER — BUDESONIDE 180 MCG/ACT IN AEPB: 1.0000 | INHALATION_SPRAY | Freq: Three times a day (TID) | RESPIRATORY_TRACT | Status: DC | PRN

## 2021-11-02 MED ORDER — ASPIRIN 81 MG PO TBEC
81.0000 mg | DELAYED_RELEASE_TABLET | Freq: Every day | ORAL | Status: DC
  Administered 2021-11-03: 81 mg via ORAL
  Filled 2021-11-02: qty 1

## 2021-11-02 MED ORDER — BUDESONIDE 0.25 MG/2ML IN SUSP: 0.2500 mg | Freq: Three times a day (TID) | RESPIRATORY_TRACT | Status: DC | PRN

## 2021-11-02 MED ORDER — SALINE NASAL SPRAY 0.65 % NA SOLN: 1.0000 | NASAL | Status: DC | PRN

## 2021-11-02 MED ORDER — METRONIDAZOLE 500 MG PO TABS
500.0000 mg | ORAL_TABLET | Freq: Two times a day (BID) | ORAL | Status: DC
  Filled 2021-11-02: qty 1

## 2021-11-02 MED ORDER — NITROGLYCERIN 0.2 MG/HR TD PT24
0.2000 mg | MEDICATED_PATCH | Freq: Every day | TRANSDERMAL | Status: DC
  Administered 2021-11-03 – 2021-11-04 (×2): 0.2 mg via TRANSDERMAL
  Filled 2021-11-02 (×3): qty 1

## 2021-11-02 MED ORDER — ACETAMINOPHEN 650 MG RE SUPP: 650.0000 mg | Freq: Four times a day (QID) | RECTAL | Status: DC | PRN

## 2021-11-02 MED ORDER — DEXTROSE 50 % IV SOLN
1.0000 | INTRAVENOUS | Status: DC | PRN
Start: 2021-11-02 — End: 2021-11-04

## 2021-11-02 MED ORDER — ONDANSETRON HCL 4 MG/2ML IJ SOLN: 4.0000 mg | Freq: Four times a day (QID) | INTRAMUSCULAR | Status: DC | PRN

## 2021-11-02 MED ORDER — ENOXAPARIN SODIUM 60 MG/0.6ML IJ SOSY
60.0000 mg | PREFILLED_SYRINGE | INTRAMUSCULAR | Status: DC
  Administered 2021-11-02 – 2021-11-04 (×3): 60 mg via SUBCUTANEOUS
  Filled 2021-11-02 (×3): qty 0.6

## 2021-11-02 MED ORDER — AMLODIPINE BESYLATE 10 MG PO TABS
10.0000 mg | ORAL_TABLET | Freq: Every day | ORAL | Status: DC
  Administered 2021-11-02 – 2021-11-03 (×2): 10 mg via ORAL
  Filled 2021-11-02 (×2): qty 1

## 2021-11-02 MED ORDER — SALINE SPRAY 0.65 % NA SOLN
1.0000 | NASAL | Status: DC | PRN
  Filled 2021-11-02: qty 44

## 2021-11-02 MED ORDER — HYDROCODONE-ACETAMINOPHEN 5-325 MG PO TABS: 1.0000 | ORAL_TABLET | Freq: Four times a day (QID) | ORAL | Status: DC | PRN

## 2021-11-02 MED ORDER — SODIUM CHLORIDE 0.9 % IV SOLN
INTRAVENOUS | Status: DC
Start: 2021-11-02 — End: 2021-11-03

## 2021-11-02 MED ORDER — VANCOMYCIN HCL IN DEXTROSE 1-5 GM/200ML-% IV SOLN
1000.0000 mg | Freq: Two times a day (BID) | INTRAVENOUS | Status: DC
  Administered 2021-11-02 – 2021-11-04 (×4): 1000 mg via INTRAVENOUS
  Filled 2021-11-02 (×5): qty 200

## 2021-11-02 MED ORDER — ALBUTEROL SULFATE (2.5 MG/3ML) 0.083% IN NEBU: 2.5000 mg | INHALATION_SOLUTION | Freq: Four times a day (QID) | RESPIRATORY_TRACT | Status: DC | PRN

## 2021-11-02 MED ORDER — ACETAMINOPHEN 325 MG PO TABS: 650.0000 mg | ORAL_TABLET | Freq: Four times a day (QID) | ORAL | Status: DC | PRN

## 2021-11-02 MED ORDER — AMITRIPTYLINE HCL 25 MG PO TABS
25.0000 mg | ORAL_TABLET | Freq: Every day | ORAL | Status: DC
  Administered 2021-11-02 – 2021-11-03 (×2): 25 mg via ORAL
  Filled 2021-11-02 (×3): qty 1

## 2021-11-02 MED ORDER — VARENICLINE TARTRATE 1 MG PO TABS
1.0000 mg | ORAL_TABLET | Freq: Every day | ORAL | Status: DC
  Administered 2021-11-02 – 2021-11-03 (×2): 1 mg via ORAL
  Filled 2021-11-02 (×3): qty 1

## 2021-11-02 MED ORDER — INSULIN ASPART 100 UNIT/ML IJ SOLN
0.0000 [IU] | INTRAMUSCULAR | Status: DC
  Administered 2021-11-02: 2 [IU] via SUBCUTANEOUS

## 2021-11-02 MED ORDER — VANCOMYCIN HCL 2000 MG/400ML IV SOLN
2000.0000 mg | Freq: Once | INTRAVENOUS | Status: DC
  Filled 2021-11-02: qty 400

## 2021-11-02 MED ORDER — SODIUM CHLORIDE 0.9 % IV SOLN
2.0000 g | INTRAVENOUS | Status: DC
  Administered 2021-11-02 – 2021-11-04 (×3): 2 g via INTRAVENOUS
  Filled 2021-11-02 (×3): qty 20

## 2021-11-02 MED ORDER — NITROGLYCERIN 0.4 MG SL SUBL
0.4000 mg | SUBLINGUAL_TABLET | SUBLINGUAL | Status: DC | PRN
  Administered 2021-11-02 (×2): 0.4 mg via SUBLINGUAL
  Filled 2021-11-02: qty 1

## 2021-11-02 MED ORDER — INSULIN ASPART 100 UNIT/ML IJ SOLN: 0.0000 [IU] | Freq: Three times a day (TID) | INTRAMUSCULAR | Status: DC

## 2021-11-02 MED ORDER — LISINOPRIL 20 MG PO TABS
40.0000 mg | ORAL_TABLET | Freq: Every day | ORAL | Status: DC
  Administered 2021-11-02 – 2021-11-03 (×2): 40 mg via ORAL
  Filled 2021-11-02 (×2): qty 2

## 2021-11-02 MED ORDER — VANCOMYCIN HCL 10 G IV SOLR
2500.0000 mg | Freq: Once | INTRAVENOUS | Status: AC
  Administered 2021-11-02: 2500 mg via INTRAVENOUS
  Filled 2021-11-02: qty 2500

## 2021-11-02 MED ORDER — SODIUM CHLORIDE 0.9% FLUSH
3.0000 mL | Freq: Two times a day (BID) | INTRAVENOUS | Status: DC
  Administered 2021-11-02 – 2021-11-03 (×2): 3 mL via INTRAVENOUS

## 2021-11-02 MED ORDER — GABAPENTIN 300 MG PO CAPS
300.0000 mg | ORAL_CAPSULE | Freq: Every day | ORAL | Status: DC
  Administered 2021-11-02 – 2021-11-03 (×2): 300 mg via ORAL
  Filled 2021-11-02 (×2): qty 1

## 2021-11-02 MED ORDER — OXYCODONE-ACETAMINOPHEN 5-325 MG PO TABS
1.0000 | ORAL_TABLET | ORAL | Status: DC | PRN
  Administered 2021-11-02 – 2021-11-04 (×5): 1 via ORAL
  Filled 2021-11-02 (×5): qty 1

## 2021-11-02 MED ORDER — ATORVASTATIN CALCIUM 80 MG PO TABS
80.0000 mg | ORAL_TABLET | Freq: Every day | ORAL | Status: DC
  Administered 2021-11-02 – 2021-11-03 (×2): 80 mg via ORAL
  Filled 2021-11-02 (×2): qty 1

## 2021-11-02 MED ORDER — SUMATRIPTAN SUCCINATE 25 MG PO TABS
25.0000 mg | ORAL_TABLET | ORAL | Status: DC | PRN
  Filled 2021-11-02: qty 1

## 2021-11-02 MED ORDER — ONDANSETRON HCL 4 MG PO TABS: 4.0000 mg | ORAL_TABLET | Freq: Four times a day (QID) | ORAL | Status: DC | PRN

## 2021-11-02 MED ORDER — ASPIRIN 81 MG PO CHEW
324.0000 mg | CHEWABLE_TABLET | Freq: Once | ORAL | Status: AC
  Administered 2021-11-02: 324 mg via ORAL
  Filled 2021-11-02: qty 4

## 2021-11-02 MED ORDER — INSULIN ASPART 100 UNIT/ML IJ SOLN
0.0000 [IU] | Freq: Three times a day (TID) | INTRAMUSCULAR | Status: DC
  Administered 2021-11-02: 2 [IU] via SUBCUTANEOUS
  Administered 2021-11-03: 3 [IU] via SUBCUTANEOUS
  Administered 2021-11-03 – 2021-11-04 (×4): 5 [IU] via SUBCUTANEOUS

## 2021-11-02 NOTE — H&P (Addendum)
?History and Physical  ? ? ?PatientAmbika Zettlemoyer Le HBZ:[id number] DOB: 10-03-1970 ?DOA: 11/01/2021 ?DOS: the patient was seen and examined on 11/02/2021 ?PCP: Maud Deed, PA  ?Patient coming from: Home ? ?Chief Complaint:  ?Chief Complaint  ?Patient presents with  ? Chest Pain  ? ?HPI: Valerie Le is a 51 y.o. female with medical history significant of hypertension, hyperlipidemia, diabetes mellitus type 1, obesity, and osteomyelitis of the right foot s/p ray amputation right foot with subsequent revision 06/2021 who presents with complaints of 4-day history of chest pain.  Patient reported having severe burning substernal chest pain.  Symptoms initially started after waking up before she had eaten anything.  She thought it was just heartburn and had taken some Pepto-Bismol with improvement in symptoms.  However, symptoms returned later on that evening.  Reports taking Pepto-Bismol again without improvement, Tums, and lastly toast.  Pain reportedly never fully went away and ranged from a 3 to a 9 on the pain scale.  She noted that pain seemed to radiate to her neck and more to the left side as time passed.  Noted associated symptoms of nausea, vomiting, and some shortness of breath.  She has been being followed in outpatient setting by orthopedics and has been on antibiotics of doxycycline and Bactrim to treat a foot infection.  One week ago she had been seen at the orthopedics office and they had performed I&D.  Since that time her foot has been draining.  She complains of sharp shooting pains down her left leg as well as swelling.  At one point last week she also reported having fever up to 101.4 ?F.  She denies any complaints of blood in her stools, dark stools, or dysuria symptoms.  She does smoke still,but is in the process of trying to quit on Chantix. ? ?Upon admission into the emergency department patient was noted to be afebrile with respirations elevated up to 26, and all other vital  signs relatively maintained.  Labs since 5/16 significant for WBC 20.2, hemoglobin 9.9 with low MCV and MCH, platelet count 431, high-sensitivity troponin 18-> 9-> 5, and sed rate 84.  EKG did not reveal any significant ischemic changes.  Patient had been given full dose aspirin and nitroglycerin for which she stated her chest discomfort completely resolved..  X-rays of the right foot had revealed findings consistent with fourth and fifth metatarsal osteomyelitis.  The patient was started on Rocephin and metronidazole.  MRI of the right foot revealed soft tissue ulceration around the base of the fifth metatarsal with soft tissue emphysema inflammatory changes consistent with a soft tissue and infection without drainable abscess, and marrow changes of the fifth metatarsal base and adjacent cuboid with concern for osteomyelitis. ? ? ?Review of Systems: As mentioned in the history of present illness. All other systems reviewed and are negative. ?Past Medical History:  ?Diagnosis Date  ? Anemia   ? Asthma   ? as a child  ? Depression   ? Diabetes mellitus without complication (HCC)   ? Type 1 - Has Insulin Pump  ? Headache   ? migraines  ? High cholesterol   ? Hypertension   ? ?Past Surgical History:  ?Procedure Laterality Date  ? AMPUTATION Right 12/22/2020  ? Procedure: RIGHT TRANSMETATARSAL AMPUTATION;  Surgeon: [name], MD;  Location: [facility] OR;  Service: Orthopedics;  Laterality: Right;  ? AMPUTATION Right 06/29/2021  ? Procedure: REVISION RIGHT TRANSMETATARSAL AMPUTATION;  Surgeon: [name], MD;  Location: [facility]  OR;  Service: Orthopedics;  Laterality: Right;  ? AMPUTATION TOE Right 12/04/2020  ? Procedure: AMPUTATION RIGHT SECOND TOE, I&D FOOT;  Surgeon: Eldred MangesYates, Mark C, MD;  Location: [facility] ORS;  Service: Orthopedics;  Laterality: Right;  Right second toe amputation, I&D right foot.  ? birth control implant  07/31/2006  ? Essure Implant  ? TONSILLECTOMY    ? TYMPANOSTOMY TUBE PLACEMENT    ? WISDOM TOOTH  EXTRACTION    ? ?Social History:  reports that she has been smoking cigarettes. She has a 6.25 pack-year smoking history. She has never used smokeless tobacco. She reports that she does not currently use alcohol. She reports that she does not currently use drugs. ? ?Allergies  ?Allergen Reactions  ? Albiglutide Itching  ?  Itching site reaction with burning, itching and pain  ? Cherry Anaphylaxis  ? Coconut Oil Anaphylaxis  ? Pineapple Anaphylaxis  ? Tomato Anaphylaxis  ? Glimepiride Other (See Comments)  ?  Numbness and abdominal pain  ? Latex Hives  ? Metformin And Related Other (See Comments)  ?  Constipation/cramps  ? Glipizide Diarrhea  ? ? ?History reviewed. No pertinent family history. ? ?Prior to Admission medications   ?Medication Sig Start Date End Date Taking? Authorizing Provider  ?amitriptyline (ELAVIL) 25 MG tablet Take 25 mg by mouth at bedtime. 01/12/21  Yes [provider]  ?amLODipine (NORVASC) 10 MG tablet Take 10 mg by mouth at bedtime. 12/23/19  Yes [provider]  ?aspirin 81 MG EC tablet Take 81 mg by mouth at bedtime. 02/25/14  Yes [provider]  ?atorvastatin (LIPITOR) 80 MG tablet Take 80 mg by mouth at bedtime. 12/14/20  Yes [provider]  ?budesonide (PULMICORT) 180 MCG/ACT inhaler Inhale 1 puff into the lungs 3 (three) times daily as needed (shortness of breath).   Yes [provider]  ?Continuous Blood Gluc Receiver (DEXCOM G6 RECEIVER) DEVI change EVERY 90 days 12/17/20  Yes [provider]  ?Continuous Blood Gluc Sensor (DEXCOM G6 SENSOR) MISC use to check blood sugar change every 10 days 01/12/21  Yes [provider]  ?Continuous Blood Gluc Transmit (DEXCOM G6 TRANSMITTER) MISC Dispense and use as directed 12/17/20  Yes [provider]  ?D3-1000 25 MCG (1000 UT) capsule Take 1,000 Units by mouth at bedtime. 05/05/21  Yes [provider]  ?doxycycline (VIBRA-TABS) 100 MG tablet Take 1 tablet (100 mg total) by  mouth 2 (two) times daily. ?Patient taking differently: Take 100 mg by mouth See admin instructions. Bid x 15 days 10/22/21  Yes Kerrin ChampagneNitka, James E, MD  ?gabapentin (NEURONTIN) 300 MG capsule Take 1 capsule (300 mg total) by mouth at bedtime. 3 times a day when necessary neuropathy pain- prn ?Patient taking differently: Take 300 mg by mouth at bedtime. 09/26/21  Yes Nadara Mustarduda, Marcus V, MD  ?Glucagon (BAQSIMI ONE PACK) 3 MG/DOSE POWD Place 3 mg into the nose as needed (for blood sugar 60 or below). 10/23/18  Yes [provider]  ?ibuprofen (ADVIL,MOTRIN) 200 MG tablet Take 600 mg by mouth every 6 (six) hours as needed for mild pain.   Yes [provider]  ?L-Arginine 1000 MG TABS Take 1,000 mg by mouth daily.   Yes [provider]  ?lisinopril (ZESTRIL) 40 MG tablet Take 40 mg by mouth at bedtime. 08/22/19  Yes [provider]  ?metoCLOPramide (REGLAN) 10 MG tablet Take 10 mg by mouth every 6 (six) hours as needed for nausea.   Yes [provider]  ?  nitroGLYCERIN (NITRODUR - DOSED IN MG/24 HR) 0.2 mg/hr patch Place 1 patch (0.2 mg total) onto the skin daily. 07/08/21  Yes Adonis Huguenin, NP  ?Omega-3 Fatty Acids (FISH OIL) 1000 MG CAPS Take 1,000 mg by mouth at bedtime.   Yes [provider]  ?ondansetron (ZOFRAN) 4 MG tablet Take 1 tablet (4 mg total) by mouth every 6 (six) hours as needed for nausea. 12/09/20  Yes Albertine Grates, MD  ?oxyCODONE-acetaminophen (PERCOCET/ROXICET) 5-325 MG tablet Take 1 tablet by mouth every 4 (four) hours as needed. ?Patient taking differently: Take 1 tablet by mouth every 4 (four) hours as needed for moderate pain. 07/08/21  Yes [name], MD  ?SUMAtriptan (IMITREX) 25 MG tablet Take 25 mg by mouth every 2 (two) hours as needed for migraine. May repeat in 2 hours if headache persists or recurs.   Yes [provider]  ?triamcinolone (KENALOG) 0.025 % cream 1 application. daily as needed (rash). 08/21/20  Yes [provider]   ?TRULICITY 0.75 MG/0.5ML SOPN Inject 0.75 mg into the skin every Sunday. 05/10/21  Yes [provider]  ?Ebbie Latus PENTIPS PLUS 31G X 5 MM MISC use 1 pen needle 5 times per day 12/17/20  Yes Provider, Historical, M

## 2021-11-02 NOTE — Progress Notes (Signed)
Inpatient Diabetes Program Recommendations ? ?AACE/ADA: New Consensus Statement on Inpatient Glycemic Control (2015) ? ?Target Ranges:  Prepandial:   less than 140 mg/dL ?     Peak postprandial:   less than 180 mg/dL (1-2 hours) ?     Critically ill patients:  140 - 180 mg/dL  ? ?Lab Results  ?Component Value Date  ? GLUCAP 217 (H) 11/02/2021  ? HGBA1C 13.1 (H) 12/02/2020  ? ? ?Review of Glycemic Control ? ?Diabetes history: DM 2 (wears insulin pump) ?Outpatient Diabetes medications: Omnipod insulin pump- per last note from Dr. Hartford Poli her basal rates were 1.25 units/hr, Insulin to CHO ratio- 1 unit/6 grams of CHO, and sensitivity 1 unit drops blood sugar 30 mg/dL ?Current orders for Inpatient glycemic control:  ?Novolog moderate tid with meals ? ?Inpatient Diabetes Program Recommendations:   ? ?Patient has had insulin pump off since yesterday.  She wears insulin pump and reports that she has Type 1 DM however notes from endocrinology indicate Type 2.  CBG checked while I was present and was 217 mg/dL.   ?Recommend Novolog moderate q 4 hours and Semglee 15 units daily.  She will need meal coverage once diet started as well.  Explained plan to patient.  Will follow.  ? ?Thanks,  ?Adah Perl, RN, BC-ADM ?Inpatient Diabetes Coordinator ?Pager [phone]  (8a-5p) ? ? ?

## 2021-11-02 NOTE — Consult Note (Signed)
CARDIOLOGY CONSULT NOTE  ?Patient ID: ?Valerie Le ?MRN: [id number] ?DOB/AGE: 52-May-1972 52 y.o. ? ?Admit date: 11/01/2021 ?Referring Physician: Chest pain ?Reason for Consultation:  [facility] hospitalist ? ?HPI:  ? ?51 y.o. female  with type 1 DM, hypertension, hyperlipidemia, s/p Rt metatarsal amputation, now with recurrent osteomyelitis. Cardiology consulted for evaluation of chest pain ? ?Patient has been having constant chest heaviness for the last 4 days, without any period of being chest pain-free.  Pain did improve with 2 sublingual nitroglycerin.  EKG with no ischemic changes.  High sensitive troponin 18, 9, 5 ? ?Past Medical History:  ?Diagnosis Date  ? Anemia   ? Asthma   ? as a child  ? Depression   ? Diabetes mellitus without complication (HCC)   ? Type 1 - Has Insulin Pump  ? Headache   ? migraines  ? High cholesterol   ? Hypertension   ?  ? ?Past Surgical History:  ?Procedure Laterality Date  ? AMPUTATION Right 12/22/2020  ? Procedure: RIGHT TRANSMETATARSAL AMPUTATION;  Surgeon: [name], MD;  Location: [facility] OR;  Service: Orthopedics;  Laterality: Right;  ? AMPUTATION Right 06/29/2021  ? Procedure: REVISION RIGHT TRANSMETATARSAL AMPUTATION;  Surgeon: [name], MD;  Location: [facility] OR;  Service: Orthopedics;  Laterality: Right;  ? AMPUTATION TOE Right 12/04/2020  ? Procedure: AMPUTATION RIGHT SECOND TOE, I&D FOOT;  Surgeon: Eldred Manges, MD;  Location: [facility] ORS;  Service: Orthopedics;  Laterality: Right;  Right second toe amputation, I&D right foot.  ? birth control implant  07/31/2006  ? Essure Implant  ? TONSILLECTOMY    ? TYMPANOSTOMY TUBE PLACEMENT    ? WISDOM TOOTH EXTRACTION    ?  ? ?History reviewed. No pertinent family history.  ? ?Social History: ?Social History  ? ?Socioeconomic History  ? Marital status: Married  ?  Spouse name: Not on file  ? Number of children: Not on file  ? Years of education: Not on file  ? Highest education level: Not on file  ?Occupational History  ? Not on  file  ?Tobacco Use  ? Smoking status: Every Day  ?  Packs/day: 0.25  ?  Years: 25.00  ?  Pack years: 6.25  ?  Types: Cigarettes  ? Smokeless tobacco: Never  ? Tobacco comments:  ?  Cut down on smoking while on Chantix-  ?Vaping Use  ? Vaping Use: Never used  ?Substance and Sexual Activity  ? Alcohol use: Not Currently  ?  Comment: Once a year on New Year's Day  ? Drug use: Not Currently  ? Sexual activity: Yes  ?  Birth control/protection: Implant  ?  Comment: Essure Implant  ?Other Topics Concern  ? Not on file  ?Social History Narrative  ? Not on file  ? ?Social Determinants of Health  ? ?Financial Resource Strain: Not on file  ?Food Insecurity: Not on file  ?Transportation Needs: Not on file  ?Physical Activity: Not on file  ?Stress: Not on file  ?Social Connections: Not on file  ?Intimate Partner Violence: Not on file  ?  ? ?(Not in a hospital admission) ? ? ?Review of Systems  ?Cardiovascular:  Positive for chest pain.  ?  ? ?Physical Exam: ?Physical Exam ?Vitals and nursing note reviewed.  ?Constitutional:   ?   General: She is not in acute distress. ?Neck:  ?   Vascular: No JVD.  ?Cardiovascular:  ?   Rate and Rhythm: Normal rate and regular rhythm.  ?  Pulses: Normal pulses.  ?   Heart sounds: Normal heart sounds. No murmur heard. ?Pulmonary:  ?   Effort: Pulmonary effort is normal.  ?   Breath sounds: Normal breath sounds. No wheezing or rales.  ?Musculoskeletal:  ?   Right lower leg: No edema.  ?   Left lower leg: No edema.  ? ? ? ?  ?Imaging/tests reviewed and independently interpreted: ?Lab Results: ? Latest Reference Range & Units 11/01/21 21:16 11/01/21 23:50 11/02/21 04:22  ?Troponin I (High Sensitivity) <18 ng/L 18 (H) 9 5  ?(H): Data is abnormally high ? ?Cardiac Studies: ? ?Telemetry 11/02/2021: ?No arrhythmia ? ?EKG 11/02/2021: ?Sinus rhythm ?No ischemic changes ? ?Echocardiogram 11/02/2021: ? 1. Left ventricular ejection fraction, by estimation, is 60 to 65%. The  ?left ventricle has normal  function. The left ventricle has no regional  ?wall motion abnormalities. Left ventricular diastolic parameters were  ?normal.  ? 2. Right ventricular systolic function is normal. The right ventricular  ?size is normal.  ? 3. The mitral valve is normal in structure. Trivial mitral valve  ?regurgitation.  ? 4. The aortic valve is normal in structure. Aortic valve regurgitation is  ?not visualized. Aortic valve sclerosis is present, with no evidence of  ?aortic valve stenosis.  ? ?Assessment & Recommendations: ? ? ?51 y.o. female  with type 1 DM, hypertension, hyperlipidemia, s/p Rt metatarsal amputation, now with recurrent osteomyelitis. Cardiology consulted for evaluation of chest pain ? ?Chest pain: ?Some features typical, although is atypical.  She has risk factors for type I DM, hypertension, hyperlipidemia.  EKG and troponins are reassuring for absence of ACS.  Recommend nuclear stress testing for risk stratification, especially in view of upcoming anticipated amputation surgery in right foot. ? ? ?Discussed interpretation of tests and management recommendations with the primary team ? ?  ? ?Elder Negus, MD ?Pager: [phone] ?Office: [phone] ? ?

## 2021-11-02 NOTE — ED Notes (Signed)
Asked Dr Karle Starch if he wanted a lactic or blood cultures but he declined.  ?

## 2021-11-02 NOTE — Progress Notes (Addendum)
Pharmacy Antibiotic Note ? ?Valerie Le is a 50 y.o. female admitted on 11/01/2021 with  osteomyelitis of R foot .  Pharmacy has been consulted for vancomycin dosing. ? ?Plan: ?Vancomycin 2500 mg IV x 1 followed by,  ?Vancomycin 1000mg  IV q12h (eAUC ~503) ?Goal AUC 400-550  ?Continue Ceftriaxone and Metronidazole per MD ?Monitor CBC, SCr, temp, an clinical s/sx of infection ?Check vancomycin levels at steady state ?F/u cultures and de-escalate as indicated ? ?Height: 5\' 11"  (180.3 cm) ?Weight: 120.2 kg (265 lb) ?IBW/kg (Calculated) : 70.8 ? ?Temp (24hrs), Avg:98.3 ?F (36.8 ?C), Min:98.1 ?F (36.7 ?C), Max:98.5 ?F (36.9 ?C) ? ?Recent Labs  ?Lab 11/01/21 ?2116  ?WBC 20.2*  ?CREATININE 1.00  ?  ?Estimated Creatinine Clearance: 95.2 mL/min (by C-G formula based on SCr of 1 mg/dL).   ? ?Allergies  ?Allergen Reactions  ? Albiglutide Itching  ?  Itching site reaction with burning, itching and pain  ? Cherry Anaphylaxis  ? Coconut Oil Anaphylaxis  ? Pineapple Anaphylaxis  ? Tomato Anaphylaxis  ? Glimepiride Other (See Comments)  ?  Numbness and abdominal pain  ? Latex Hives  ? Metformin And Related Other (See Comments)  ?  Constipation/cramps  ? Glipizide Diarrhea  ? ? ?Antimicrobials this admission: ?Ceftriaxone 5/17 >>  ?Metronidazole 5/17 >> ?Vancomycin 5/17 >>  ? ?Dose adjustments this admission: ?N/A ? ?Microbiology results: ?None to date ? ?Thank you for allowing pharmacy to be a part of this patient?s care. ? ?6/17 ?11/02/2021 9:47 AM ? ?

## 2021-11-02 NOTE — ED Notes (Signed)
Secured chatted IP RN awaiting response ?

## 2021-11-02 NOTE — ED Provider Notes (Signed)
? ?[facility] EMERGENCY DEPARTMENT  ?Provider Note ? ?CSN: [id number] ?Arrival date & time: 11/01/21 1942 ? ?History ?Chief Complaint  ?Patient presents with  ? Chest Pain  ? ? ?Valerie Le is a 51 y.o. female with history of HTN, DM, HLD has had R foot partial amputation for osteomyelitis, more recently seen at Dr. Audrie Lia office for infection/bulla on the plantar surface that was drained, started on Augmentin. She is here today for chest pains. She reports 4 days of intermittent mid chest pain, described as like heart burn, not improved with Tums or Pepto as usual but it was improved with eating toast until yesterday evening when nothing seemed to help. She had a telehealth visit and was advised to come to the ED. She had one dose of nausea/vomiting in triage and has continued to have pain while waiting for ED room. She denies any fevers, no SOB. No history of CAD. Has not been taking ASA or NTG.  ? ? ?Home Medications ?Prior to Admission medications   ?Medication Sig Start Date End Date Taking? Authorizing Provider  ?amitriptyline (ELAVIL) 25 MG tablet Take 25 mg by mouth at bedtime. 01/12/21  Yes [provider]  ?amLODipine (NORVASC) 10 MG tablet Take 10 mg by mouth at bedtime. 12/23/19  Yes [provider]  ?aspirin 81 MG EC tablet Take 81 mg by mouth at bedtime. 02/25/14  Yes [provider]  ?atorvastatin (LIPITOR) 80 MG tablet Take 80 mg by mouth at bedtime. 12/14/20  Yes [provider]  ?budesonide (PULMICORT) 180 MCG/ACT inhaler Inhale 1 puff into the lungs 3 (three) times daily as needed (shortness of breath).   Yes [provider]  ?Continuous Blood Gluc Receiver (DEXCOM G6 RECEIVER) DEVI change EVERY 90 days 12/17/20  Yes [provider]  ?Continuous Blood Gluc Sensor (DEXCOM G6 SENSOR) MISC use to check blood sugar change every 10 days 01/12/21  Yes [provider]  ?Continuous Blood Gluc Transmit (DEXCOM G6 TRANSMITTER)  MISC Dispense and use as directed 12/17/20  Yes [provider]  ?D3-1000 25 MCG (1000 UT) capsule Take 1,000 Units by mouth at bedtime. 05/05/21  Yes [provider]  ?doxycycline (VIBRA-TABS) 100 MG tablet Take 1 tablet (100 mg total) by mouth 2 (two) times daily. ?Patient taking differently: Take 100 mg by mouth See admin instructions. Bid x 15 days 10/22/21  Yes Kerrin Champagne, MD  ?gabapentin (NEURONTIN) 300 MG capsule Take 1 capsule (300 mg total) by mouth at bedtime. 3 times a day when necessary neuropathy pain- prn ?Patient taking differently: Take 300 mg by mouth at bedtime. 09/26/21  Yes [name], MD  ?Glucagon (BAQSIMI ONE PACK) 3 MG/DOSE POWD Place 3 mg into the nose as needed (for blood sugar 60 or below). 10/23/18  Yes [provider]  ?ibuprofen (ADVIL,MOTRIN) 200 MG tablet Take 600 mg by mouth every 6 (six) hours as needed for mild pain.   Yes [provider]  ?L-Arginine 1000 MG TABS Take 1,000 mg by mouth daily.   Yes [provider]  ?lisinopril (ZESTRIL) 40 MG tablet Take 40 mg by mouth at bedtime. 08/22/19  Yes [provider]  ?metoCLOPramide (REGLAN) 10 MG tablet Take 10 mg by mouth every 6 (six) hours as needed for nausea.   Yes [provider]  ?nitroGLYCERIN (NITRODUR - DOSED IN MG/24 HR) 0.2 mg/hr patch Place 1 patch (0.2 mg total) onto the skin daily. 07/08/21  Yes Adonis Huguenin, NP  ?  Omega-3 Fatty Acids (FISH OIL) 1000 MG CAPS Take 1,000 mg by mouth at bedtime.   Yes [provider]  ?ondansetron (ZOFRAN) 4 MG tablet Take 1 tablet (4 mg total) by mouth every 6 (six) hours as needed for nausea. 12/09/20  Yes Albertine GratesXu, Fang, MD  ?oxyCODONE-acetaminophen (PERCOCET/ROXICET) 5-325 MG tablet Take 1 tablet by mouth every 4 (four) hours as needed. ?Patient taking differently: Take 1 tablet by mouth every 4 (four) hours as needed for moderate pain. 07/08/21  Yes Nadara Mustarduda, Marcus V, MD  ?SUMAtriptan (IMITREX) 25 MG tablet Take 25 mg by mouth  every 2 (two) hours as needed for migraine. May repeat in 2 hours if headache persists or recurs.   Yes [provider]  ?triamcinolone (KENALOG) 0.025 % cream 1 application. daily as needed (rash). 08/21/20  Yes [provider]  ?TRULICITY 0.75 MG/0.5ML SOPN Inject 0.75 mg into the skin every Sunday. 05/10/21  Yes [provider]  ?UNIFINE PENTIPS PLUS 31G X 5 MM MISC use 1 pen needle 5 times per day 12/17/20  Yes [provider]  ?varenicline (CHANTIX) 1 MG tablet Take 1 mg by mouth at bedtime.   Yes [provider]  ?vitamin C (ASCORBIC ACID) 500 MG tablet Take 500 mg by mouth at bedtime.   Yes [provider]  ?ammonium lactate (AMLACTIN) 12 % cream Apply 1 application topically daily as needed for dry skin (adter showers). 02/15/21   [provider]  ?amoxicillin-clavulanate (AUGMENTIN) 875-125 MG tablet Take 1 tablet by mouth 2 (two) times daily. ?Patient not taking: Reported on 11/02/2021 06/15/21   Odette FractionManandhar, Sabina, MD  ?Insulin Human (INSULIN PUMP) SOLN Inject into the skin continuous. 200 units every three days    [provider]  ?sodium chloride (OCEAN) 0.65 % nasal spray Place 1-2 sprays into the nose as needed for congestion. 09/22/15   [provider]  ?sulfamethoxazole-trimethoprim (BACTRIM DS) 800-160 MG tablet Take 1 tablet by mouth 2 (two) times daily. ?Patient not taking: Reported on 11/02/2021 10/22/21   Kerrin ChampagneNitka, James E, MD  ?Myrlene BrokerUJEO MAX SOLOSTAR 300 UNIT/ML Solostar Pen Inject 300 Units into the skin once a week. 12/17/20   [provider]  ? ? ? ?Allergies    ?Albiglutide, Cherry, Coconut oil, Pineapple, Tomato, Glimepiride, Latex, Metformin and related, and Glipizide ? ? ?Review of Systems   ?Review of Systems ?Please see HPI for pertinent positives and negatives ? ?Physical Exam ?BP (!) 119/59   Pulse 88   Temp 98.1 ?F (36.7 ?C) (Oral)   Resp 19   Ht 5\' 11"  (1.803 m)   Wt 120.2 kg   SpO2 98%   BMI 36.96 kg/m?   ? ?Physical Exam ?Vitals and nursing note reviewed.  ?Constitutional:   ?   Appearance: Normal appearance.  ?HENT:  ?   Head: Normocephalic and atraumatic.  ?   Nose: Nose normal.  ?   Mouth/Throat:  ?   Mouth: Mucous membranes are moist.  ?Eyes:  ?   Extraocular Movements: Extraocular movements intact.  ?   Conjunctiva/sclera: Conjunctivae normal.  ?Cardiovascular:  ?   Rate and Rhythm: Normal rate.  ?Pulmonary:  ?   Effort: Pulmonary effort is normal.  ?   Breath sounds: Normal breath sounds.  ?Chest:  ?   Chest wall: Tenderness present.  ?Abdominal:  ?   General: Abdomen is flat.  ?   Palpations: Abdomen is soft.  ?   Tenderness: There is no abdominal tenderness.  ?Musculoskeletal:     ?  General: No swelling. Normal range of motion.  ?   Cervical back: Neck supple.  ?   Comments: Large bulla on plantar R foot draining foul smelling fluid  ?Skin: ?   General: Skin is warm and dry.  ?Neurological:  ?   General: No focal deficit present.  ?   Mental Status: She is alert.  ?Psychiatric:     ?   Mood and Affect: Mood normal.  ? ? ? ?ED Results / Procedures / Treatments   ?EKG ?EKG Interpretation ? ?Date/Time:  Wednesday Nov 02 2021 04:48:06 EDT ?Ventricular Rate:  90 ?PR Interval:  168 ?QRS Duration: 85 ?QT Interval:  365 ?QTC Calculation: 447 ?R Axis:   65 ?Text Interpretation: Sinus rhythm No significant change since last tracing EARLIER SAME DATE Confirmed by Susy Frizzle [phone]) on 11/02/2021 4:55:52 AM ? ?Procedures ?Procedures ? ?Medications Ordered in the ED ?Medications  ?nitroGLYCERIN (NITROSTAT) SL tablet 0.4 mg (0.4 mg Sublingual Given 11/02/21 0448)  ?cefTRIAXone (ROCEPHIN) 2 g in sodium chloride 0.9 % 100 mL IVPB (2 g Intravenous New Bag/Given 11/02/21 0547)  ?  And  ?metroNIDAZOLE (FLAGYL) tablet 500 mg (has no administration in time range)  ?aspirin chewable tablet 324 mg (324 mg Oral Given 11/02/21 0431)  ? ? ?Initial Impression and Plan ? Patient here primarily for chest pain, ACS vs GI etiology.  Had labs in triage showing CBC with leukocytosis, worse than previous, mild anemia. BMP is unremarkable. Trop trending down 18->9. Will check a repeat EKG and a third Trop. ASA and NTG for continued pain.

## 2021-11-02 NOTE — Progress Notes (Signed)
Echocardiogram ?2D Echocardiogram has been performed. ? ?Valerie Le  [name] ?11/02/2021, 11:56 AM ?

## 2021-11-02 NOTE — Consult Note (Signed)
Reason for Consult:Right foot osteo ?Referring Physician: Fuller Le ?Time called: 0917 ?Time at bedside: 0950 ? ? ?Valerie Le is an 51 y.o. female.  ?HPI: Valerie Le came to the ED with CP for which she's being worked up. She had seen Valerie Le in the office last week who drained a hematoma on her foot. She's been having pain and drainage from the foot since the weekend. X-rays here showed likely osteo in the 4th and 5th MT's and orthopedic surgery was consulted. ? ?Past Medical History:  ?Diagnosis Date  ? Anemia   ? Asthma   ? as a child  ? Depression   ? Diabetes mellitus without complication ([place])   ? Type 1 - Has Insulin Pump  ? Headache   ? migraines  ? High cholesterol   ? Hypertension   ? ? ?Past Surgical History:  ?Procedure Laterality Date  ? AMPUTATION Right 12/22/2020  ? Procedure: RIGHT TRANSMETATARSAL AMPUTATION;  Surgeon: Valerie Minion, MD;  Location: [facility];  Service: Orthopedics;  Laterality: Right;  ? AMPUTATION Right 06/29/2021  ? Procedure: REVISION RIGHT TRANSMETATARSAL AMPUTATION;  Surgeon: Valerie Minion, MD;  Location: [facility];  Service: Orthopedics;  Laterality: Right;  ? AMPUTATION TOE Right 12/04/2020  ? Procedure: AMPUTATION RIGHT SECOND TOE, I&D FOOT;  Surgeon: Valerie Killings, MD;  Location: [facility];  Service: Orthopedics;  Laterality: Right;  Right second toe amputation, I&D right foot.  ? birth control implant  07/31/2006  ? Essure Implant  ? TONSILLECTOMY    ? TYMPANOSTOMY TUBE PLACEMENT    ? WISDOM TOOTH EXTRACTION    ? ? ?History reviewed. No pertinent family history. ? ?Social History:  reports that she has been smoking cigarettes. She has a 6.25 pack-year smoking history. She has never used smokeless tobacco. She reports that she does not currently use alcohol. She reports that she does not currently use drugs. ? ?Allergies:  ?Allergies  ?Allergen Reactions  ? Albiglutide Itching  ?  Itching site reaction with burning, itching and pain  ? Cherry Anaphylaxis  ? Coconut Oil  Anaphylaxis  ? Pineapple Anaphylaxis  ? Tomato Anaphylaxis  ? Glimepiride Other (See Comments)  ?  Numbness and abdominal pain  ? Latex Hives  ? Metformin And Related Other (See Comments)  ?  Constipation/cramps  ? Glipizide Diarrhea  ? ? ?Medications: I have reviewed the patient's current medications. ? ?Results for orders placed or performed during the hospital encounter of 11/01/21 (from the past 48 hour(s))  ?Basic metabolic panel     Status: Abnormal  ? Collection Time: 11/01/21  9:16 PM  ?Result Value Ref Range  ? Sodium 137 135 - 145 mmol/L  ? Potassium 4.5 3.5 - 5.1 mmol/L  ? Chloride 105 98 - 111 mmol/L  ? CO2 23 22 - 32 mmol/L  ? Glucose, Bld 145 (H) 70 - 99 mg/dL  ?  Comment: Glucose reference range applies only to samples taken after fasting for at least 8 hours.  ? BUN 12 6 - 20 mg/dL  ? Creatinine, Ser 1.00 0.44 - 1.00 mg/dL  ? Calcium 9.2 8.9 - 10.3 mg/dL  ? GFR, Estimated >60 >60 mL/min  ?  Comment: (NOTE) ?Calculated using the CKD-EPI Creatinine Equation (2021) ?  ? Anion gap 9 5 - 15  ?  Comment: Performed at [facility], [place] [address], [place], [place] [address]  ?CBC     Status: Abnormal  ? Collection Time: 11/01/21  9:16 PM  ?  Result Value Ref Range  ? WBC 20.2 (H) 4.0 - 10.5 K/uL  ? RBC 4.41 3.87 - 5.11 MIL/uL  ? Hemoglobin 9.9 (L) 12.0 - 15.0 g/dL  ? HCT 31.4 (L) 36.0 - 46.0 %  ? MCV 71.2 (L) 80.0 - 100.0 fL  ? MCH 22.4 (L) 26.0 - 34.0 pg  ? MCHC 31.5 30.0 - 36.0 g/dL  ? RDW 18.1 (H) 11.5 - 15.5 %  ? Platelets 431 (H) 150 - 400 K/uL  ? nRBC 0.0 0.0 - 0.2 %  ?  Comment: Performed at [facility], [place] [address] [place], [place] [address]  ?Troponin I (High Sensitivity)     Status: Abnormal  ? Collection Time: 11/01/21  9:16 PM  ?Result Value Ref Range  ? Troponin I (High Sensitivity) 18 (H) <18 ng/L  ?  Comment: (NOTE) ?Elevated high sensitivity troponin I (hsTnI) values and significant  ?changes across serial measurements may suggest ACS but many other  ?chronic and acute  conditions are known to elevate hsTnI results.  ?Refer to the "Links" section for chest pain algorithms and additional  ?guidance. ?Performed at [facility], [place] [address], [place], [place] ?[address] ?  ?Troponin I (High Sensitivity)     Status: None  ? Collection Time: 11/01/21 11:50 PM  ?Result Value Ref Range  ? Troponin I (High Sensitivity) 9 <18 ng/L  ?  Comment: (NOTE) ?Elevated high sensitivity troponin I (hsTnI) values and significant  ?changes across serial measurements may suggest ACS but many other  ?chronic and acute conditions are known to elevate hsTnI results.  ?Refer to the "Links" section for chest pain algorithms and additional  ?guidance. ?Performed at [facility], [place] [address], [place], [place] ?[address] ?  ?Sedimentation rate     Status: Abnormal  ? Collection Time: 11/02/21  4:22 AM  ?Result Value Ref Range  ? Sed Rate 84 (H) 0 - 22 mm/hr  ?  Comment: Performed at [facility], [place] [address], [place], [place] [address]  ?Troponin I (High Sensitivity)     Status: None  ? Collection Time: 11/02/21  4:22 AM  ?Result Value Ref Range  ? Troponin I (High Sensitivity) 5 <18 ng/L  ?  Comment: (NOTE) ?Elevated high sensitivity troponin I (hsTnI) values and significant  ?changes across serial measurements may suggest ACS but many other  ?chronic and acute conditions are known to elevate hsTnI results.  ?Refer to the "Links" section for chest pain algorithms and additional  ?guidance. ?Performed at [facility], [place] [address], [place], [place] ?[address] ?  ? ? ?DG Chest 2 View ? ?Result Date: 11/01/2021 ?CLINICAL DATA:  Chest pain, shortness of breath EXAM: CHEST - 2 VIEW COMPARISON:  None Available. FINDINGS: Lungs are clear.  No pleural effusion or pneumothorax. The heart is normal in size. Visualized osseous structures are within normal limits. IMPRESSION: Normal chest radiographs. Electronically Signed   By: Valerie Le M.D.   On: 11/01/2021 21:30  ? ?MR FOOT  RIGHT WO CONTRAST ? ?Result Date: 11/02/2021 ?CLINICAL DATA:  Diabetic with right foot swelling. History of partial amputation for osteomyelitis. Transmetatarsal amputation 12/22/2020 with revision 06/29/2021. EXAM: MRI OF THE RIGHT FOREFOOT WITHOUT CONTRAST TECHNIQUE: Multiplanar, multisequence MR imaging of the right foot was performed. No intravenous contrast was administered. COMPARISON:  MRI 12/03/2020. Radiographs 11/02/2021, 05/03/2021 and 12/02/2020. FINDINGS: Bones/Joint/Cartilage Examination includes most of the remaining foot status post transmetatarsal amputation. Compared with the previous  MRI, there is new subchondral marrow T2 hyperintensity and decreased T1 signal within the base of the 5th metatarsal and the adjacent cuboid. No gross cortical destruction. There is mild marrow T2 hyperintensity within the remaining base of the 5th metatarsal and distally within the remaining 4th metatarsal. No significant abnormalities and the additional metatarsal bases or tarsal bones. No significant joint effusions. Ligaments Intact Lisfranc ligament. Muscles and Tendons Generalized muscular atrophy. Mild T2 hyperintensity surrounding the flexor tendons in the proximal forefoot. No significant focal intramuscular fluid collections. Soft tissues Plantar soft tissue ulceration laterally along the base of the 5th metatarsal with underlying emphysema, decreased T1 signal and heterogeneous T2 hyperintensity in the surrounding soft tissues. These soft tissue changes abut the 5th tarsometatarsal articulation, such that the underlying marrow changes are at least moderately suspicious for osteomyelitis. No drainable soft tissue abscess. IMPRESSION: 1. Soft tissue ulceration along the plantar base of the 5th metatarsal with underlying soft tissue emphysema and inflammatory changes consistent with soft tissue infection. No drainable abscess identified. 2. Marrow changes in the 5th metatarsal base and adjacent cuboid have  developed from the previous MRI of 11 months ago, and given proximity to the adjacent soft tissue findings could reflect osteomyelitis. No cortical destruction or significant joint effusions. 3. Previous transmetat

## 2021-11-02 NOTE — Plan of Care (Signed)

## 2021-11-03 ENCOUNTER — Encounter (HOSPITAL_COMMUNITY): Payer: Managed Care, Other (non HMO)

## 2021-11-03 ENCOUNTER — Inpatient Hospital Stay (HOSPITAL_COMMUNITY): Payer: Managed Care, Other (non HMO)

## 2021-11-03 ENCOUNTER — Ambulatory Visit: Payer: Managed Care, Other (non HMO) | Admitting: Orthopedic Surgery

## 2021-11-03 DIAGNOSIS — E1069 Type 1 diabetes mellitus with other specified complication: Secondary | ICD-10-CM | POA: Diagnosis not present

## 2021-11-03 DIAGNOSIS — E11628 Type 2 diabetes mellitus with other skin complications: Secondary | ICD-10-CM

## 2021-11-03 DIAGNOSIS — R778 Other specified abnormalities of plasma proteins: Secondary | ICD-10-CM | POA: Diagnosis not present

## 2021-11-03 DIAGNOSIS — L97511 Non-pressure chronic ulcer of other part of right foot limited to breakdown of skin: Secondary | ICD-10-CM | POA: Diagnosis not present

## 2021-11-03 DIAGNOSIS — L089 Local infection of the skin and subcutaneous tissue, unspecified: Secondary | ICD-10-CM

## 2021-11-03 DIAGNOSIS — E8809 Other disorders of plasma-protein metabolism, not elsewhere classified: Secondary | ICD-10-CM

## 2021-11-03 DIAGNOSIS — R072 Precordial pain: Secondary | ICD-10-CM | POA: Diagnosis not present

## 2021-11-03 LAB — CBC
HCT: 27.2 % — ABNORMAL LOW (ref 36.0–46.0)
Hemoglobin: 8.6 g/dL — ABNORMAL LOW (ref 12.0–15.0)
MCH: 22.4 pg — ABNORMAL LOW (ref 26.0–34.0)
MCHC: 31.6 g/dL (ref 30.0–36.0)
MCV: 70.8 fL — ABNORMAL LOW (ref 80.0–100.0)
Platelets: 372 10*3/uL (ref 150–400)
RBC: 3.84 MIL/uL — ABNORMAL LOW (ref 3.87–5.11)
RDW: 17.9 % — ABNORMAL HIGH (ref 11.5–15.5)
WBC: 16.8 10*3/uL — ABNORMAL HIGH (ref 4.0–10.5)
nRBC: 0 % (ref 0.0–0.2)

## 2021-11-03 LAB — COMPREHENSIVE METABOLIC PANEL
ALT: 9 U/L (ref 0–44)
AST: 7 U/L — ABNORMAL LOW (ref 15–41)
Albumin: 2.1 g/dL — ABNORMAL LOW (ref 3.5–5.0)
Alkaline Phosphatase: 76 U/L (ref 38–126)
Anion gap: 5 (ref 5–15)
BUN: 9 mg/dL (ref 6–20)
CO2: 25 mmol/L (ref 22–32)
Calcium: 8.2 mg/dL — ABNORMAL LOW (ref 8.9–10.3)
Chloride: 105 mmol/L (ref 98–111)
Creatinine, Ser: 0.95 mg/dL (ref 0.44–1.00)
GFR, Estimated: >60 mL/min (ref >60)
Glucose, Bld: 177 mg/dL — ABNORMAL HIGH (ref 70–99)
Potassium: 4.1 mmol/L (ref 3.5–5.1)
Sodium: 135 mmol/L (ref 135–145)
Total Bilirubin: 0.3 mg/dL (ref 0.3–1.2)
Total Protein: 6.5 g/dL (ref 6.5–8.1)

## 2021-11-03 LAB — GLUCOSE, CAPILLARY
Glucose-Capillary: 157 mg/dL — ABNORMAL HIGH (ref 70–99)
Glucose-Capillary: 204 mg/dL — ABNORMAL HIGH (ref 70–99)
Glucose-Capillary: 205 mg/dL — ABNORMAL HIGH (ref 70–99)
Glucose-Capillary: 108 mg/dL — ABNORMAL HIGH (ref 70–99)

## 2021-11-03 MED ORDER — TECHNETIUM TC 99M TETROFOSMIN IV KIT
31.4000 | PACK | Freq: Once | INTRAVENOUS | Status: AC | PRN
  Administered 2021-11-03: 31.4 via INTRAVENOUS

## 2021-11-03 MED ORDER — METRONIDAZOLE 500 MG/100ML IV SOLN
500.0000 mg | Freq: Two times a day (BID) | INTRAVENOUS | Status: DC
  Administered 2021-11-03 – 2021-11-04 (×3): 500 mg via INTRAVENOUS
  Filled 2021-11-03 (×3): qty 100

## 2021-11-03 MED ORDER — REGADENOSON 0.4 MG/5ML IV SOLN
0.4000 mg | Freq: Once | INTRAVENOUS | Status: AC
Start: 2021-11-03 — End: 2021-11-03
  Administered 2021-11-03: 0.4 mg via INTRAVENOUS
  Filled 2021-11-03: qty 5

## 2021-11-03 MED ORDER — TECHNETIUM TC 99M TETROFOSMIN IV KIT
10.4000 | PACK | Freq: Once | INTRAVENOUS | Status: AC | PRN
  Administered 2021-11-03: 10.4 via INTRAVENOUS

## 2021-11-03 MED ORDER — REGADENOSON 0.4 MG/5ML IV SOLN
INTRAVENOUS | Status: AC
  Filled 2021-11-03: qty 5

## 2021-11-03 NOTE — Assessment & Plan Note (Signed)
Blood pressure controlled - Continue lisinopril, amlodipine

## 2021-11-03 NOTE — Assessment & Plan Note (Signed)
-  Continue Chantix

## 2021-11-03 NOTE — Assessment & Plan Note (Signed)
See above

## 2021-11-03 NOTE — Consult Note (Signed)
ORTHOPAEDIC CONSULTATION  REQUESTING PHYSICIAN: Alberteen Sam, *  Chief Complaint: Ulceration base of the lateral column right foot  HPI: Valerie Le is a 51 y.o. female who presents with ulceration and infection base of the lateral aspect of the right foot.  Patient is status post a transmetatarsal amputation.  Patient is noticed blistering odor and drainage.  Past Medical History:  Diagnosis Date   Anemia    Asthma    as a child   Depression    Diabetes mellitus without complication (HCC)    Type 1 - Has Insulin Pump   Headache    migraines   High cholesterol    Hypertension    Past Surgical History:  Procedure Laterality Date   AMPUTATION Right 12/22/2020   Procedure: RIGHT TRANSMETATARSAL AMPUTATION;  Surgeon: [name], MD;  Location: [facility] OR;  Service: Orthopedics;  Laterality: Right;   AMPUTATION Right 06/29/2021   Procedure: REVISION RIGHT TRANSMETATARSAL AMPUTATION;  Surgeon: [name], MD;  Location: [facility] OR;  Service: Orthopedics;  Laterality: Right;   AMPUTATION TOE Right 12/04/2020   Procedure: AMPUTATION RIGHT SECOND TOE, I&D FOOT;  Surgeon: Eldred Manges, MD;  Location: [facility] ORS;  Service: Orthopedics;  Laterality: Right;  Right second toe amputation, I&D right foot.   birth control implant  07/31/2006   Essure Implant   TONSILLECTOMY     TYMPANOSTOMY TUBE PLACEMENT     WISDOM TOOTH EXTRACTION     Social History   Socioeconomic History   Marital status: Married    Spouse name: Not on file   Number of children: Not on file   Years of education: Not on file   Highest education level: Not on file  Occupational History   Not on file  Tobacco Use   Smoking status: Every Day    Packs/day: 0.25    Years: 25.00    Pack years: 6.25    Types: Cigarettes   Smokeless tobacco: Never   Tobacco comments:    Cut down on smoking while on Chantix-  Vaping Use   Vaping Use: Never used  Substance and Sexual Activity   Alcohol use: Not  Currently    Comment: Once a year on New Year's Day   Drug use: Not Currently   Sexual activity: Yes    Birth control/protection: Implant    Comment: Essure Implant  Other Topics Concern   Not on file  Social History Narrative   Not on file   Social Determinants of Health   Financial Resource Strain: Not on file  Food Insecurity: Not on file  Transportation Needs: Not on file  Physical Activity: Not on file  Stress: Not on file  Social Connections: Not on file   History reviewed. No pertinent family history. - negative except otherwise stated in the family history section Allergies  Allergen Reactions   Albiglutide Itching    Itching site reaction with burning, itching and pain   Cherry Anaphylaxis   Coconut (Cocos Nucifera) Anaphylaxis   Pineapple Anaphylaxis   Tomato Anaphylaxis   Glimepiride Other (See Comments)    Numbness and abdominal pain   Latex Hives   Metformin And Related Other (See Comments)    Constipation/cramps   Glipizide Diarrhea   Prior to Admission medications   Medication Sig Start Date End Date Taking? Authorizing Provider  amitriptyline (ELAVIL) 25 MG tablet Take 25 mg by mouth at bedtime. 01/12/21  Yes [provider]  amLODipine (NORVASC) 10 MG tablet  Take 10 mg by mouth at bedtime. 12/23/19  Yes [provider]  aspirin 81 MG EC tablet Take 81 mg by mouth at bedtime. 02/25/14  Yes [provider]  atorvastatin (LIPITOR) 80 MG tablet Take 80 mg by mouth at bedtime. 12/14/20  Yes [provider]  budesonide (PULMICORT) 180 MCG/ACT inhaler Inhale 1 puff into the lungs 3 (three) times daily as needed (shortness of breath).   Yes [provider]  Continuous Blood Gluc Receiver (DEXCOM G6 RECEIVER) DEVI change EVERY 90 days 12/17/20  Yes [provider]  Continuous Blood Gluc Sensor (DEXCOM G6 SENSOR) MISC use to check blood sugar change every 10 days 01/12/21  Yes [provider]  Continuous Blood  Gluc Transmit (DEXCOM G6 TRANSMITTER) MISC Dispense and use as directed 12/17/20  Yes [provider]  D3-1000 25 MCG (1000 UT) capsule Take 1,000 Units by mouth at bedtime. 05/05/21  Yes [provider]  doxycycline (VIBRA-TABS) 100 MG tablet Take 1 tablet (100 mg total) by mouth 2 (two) times daily. Patient taking differently: Take 100 mg by mouth See admin instructions. Bid x 15 days 10/22/21  Yes Jessy Oto, MD  gabapentin (NEURONTIN) 300 MG capsule Take 1 capsule (300 mg total) by mouth at bedtime. 3 times a day when necessary neuropathy pain- prn Patient taking differently: Take 300 mg by mouth at bedtime. 09/26/21  Yes Newt Minion, MD  Glucagon (BAQSIMI ONE PACK) 3 MG/DOSE POWD Place 3 mg into the nose as needed (for blood sugar 60 or below). 10/23/18  Yes [provider]  ibuprofen (ADVIL,MOTRIN) 200 MG tablet Take 600 mg by mouth every 6 (six) hours as needed for mild pain.   Yes [provider]  L-Arginine 1000 MG TABS Take 1,000 mg by mouth daily.   Yes [provider]  lisinopril (ZESTRIL) 40 MG tablet Take 40 mg by mouth at bedtime. 08/22/19  Yes [provider]  metoCLOPramide (REGLAN) 10 MG tablet Take 10 mg by mouth every 6 (six) hours as needed for nausea.   Yes [provider]  nitroGLYCERIN (NITRODUR - DOSED IN MG/24 HR) 0.2 mg/hr patch Place 1 patch (0.2 mg total) onto the skin daily. 07/08/21  Yes Suzan Slick, NP  Omega-3 Fatty Acids (FISH OIL) 1000 MG CAPS Take 1,000 mg by mouth at bedtime.   Yes [provider]  ondansetron (ZOFRAN) 4 MG tablet Take 1 tablet (4 mg total) by mouth every 6 (six) hours as needed for nausea. 12/09/20  Yes Florencia Reasons, MD  oxyCODONE-acetaminophen (PERCOCET/ROXICET) 5-325 MG tablet Take 1 tablet by mouth every 4 (four) hours as needed. Patient taking differently: Take 1 tablet by mouth every 4 (four) hours as needed for moderate pain. 07/08/21  Yes Newt Minion, MD  SUMAtriptan  (IMITREX) 25 MG tablet Take 25 mg by mouth every 2 (two) hours as needed for migraine. May repeat in 2 hours if headache persists or recurs.   Yes [provider]  triamcinolone (KENALOG) Q000111Q % cream 1 application. daily as needed (rash). 08/21/20  Yes [provider]  TRULICITY A999333 0000000 SOPN Inject 0.75 mg into the skin every Sunday. 05/10/21  Yes [provider]  UNIFINE PENTIPS PLUS 31G X 5 MM MISC use 1 pen needle 5 times per day 12/17/20  Yes [provider]  varenicline (CHANTIX) 1 MG tablet Take 1 mg by mouth at bedtime.   Yes [provider]  vitamin C (ASCORBIC ACID) 500 MG  tablet Take 500 mg by mouth at bedtime.   Yes [provider]  ammonium lactate (AMLACTIN) 12 % cream Apply 1 application topically daily as needed for dry skin (adter showers). 02/15/21   [provider]  amoxicillin-clavulanate (AUGMENTIN) 875-125 MG tablet Take 1 tablet by mouth 2 (two) times daily. Patient not taking: Reported on 11/02/2021 06/15/21   Rosiland Oz, MD  Insulin Human (INSULIN PUMP) SOLN Inject into the skin continuous. 200 units every three days    [provider]  sodium chloride (OCEAN) 0.65 % nasal spray Place 1-2 sprays into the nose as needed for congestion. 09/22/15   [provider]  sulfamethoxazole-trimethoprim (BACTRIM DS) 800-160 MG tablet Take 1 tablet by mouth 2 (two) times daily. Patient not taking: Reported on 11/02/2021 10/22/21   Jessy Oto, MD  TOUJEO MAX SOLOSTAR 300 UNIT/ML Solostar Pen Inject 300 Units into the skin once a week. 12/17/20   [provider]   DG Chest 2 View  Result Date: 11/01/2021 CLINICAL DATA:  Chest pain, shortness of breath EXAM: CHEST - 2 VIEW COMPARISON:  None Available. FINDINGS: Lungs are clear.  No pleural effusion or pneumothorax. The heart is normal in size. Visualized osseous structures are within normal limits. IMPRESSION: Normal chest radiographs.  Electronically Signed   By: Julian Hy M.D.   On: 11/01/2021 21:30   MR FOOT RIGHT WO CONTRAST  Result Date: 11/02/2021 CLINICAL DATA:  Diabetic with right foot swelling. History of partial amputation for osteomyelitis. Transmetatarsal amputation 12/22/2020 with revision 06/29/2021. EXAM: MRI OF THE RIGHT FOREFOOT WITHOUT CONTRAST TECHNIQUE: Multiplanar, multisequence MR imaging of the right foot was performed. No intravenous contrast was administered. COMPARISON:  MRI 12/03/2020. Radiographs 11/02/2021, 05/03/2021 and 12/02/2020. FINDINGS: Bones/Joint/Cartilage Examination includes most of the remaining foot status post transmetatarsal amputation. Compared with the previous MRI, there is new subchondral marrow T2 hyperintensity and decreased T1 signal within the base of the 5th metatarsal and the adjacent cuboid. No gross cortical destruction. There is mild marrow T2 hyperintensity within the remaining base of the 5th metatarsal and distally within the remaining 4th metatarsal. No significant abnormalities and the additional metatarsal bases or tarsal bones. No significant joint effusions. Ligaments Intact Lisfranc ligament. Muscles and Tendons Generalized muscular atrophy. Mild T2 hyperintensity surrounding the flexor tendons in the proximal forefoot. No significant focal intramuscular fluid collections. Soft tissues Plantar soft tissue ulceration laterally along the base of the 5th metatarsal with underlying emphysema, decreased T1 signal and heterogeneous T2 hyperintensity in the surrounding soft tissues. These soft tissue changes abut the 5th tarsometatarsal articulation, such that the underlying marrow changes are at least moderately suspicious for osteomyelitis. No drainable soft tissue abscess. IMPRESSION: 1. Soft tissue ulceration along the plantar base of the 5th metatarsal with underlying soft tissue emphysema and inflammatory changes consistent with soft tissue infection. No drainable abscess  identified. 2. Marrow changes in the 5th metatarsal base and adjacent cuboid have developed from the previous MRI of 11 months ago, and given proximity to the adjacent soft tissue findings could reflect osteomyelitis. No cortical destruction or significant joint effusions. 3. Previous transmetatarsal amputation through the forefoot with mild nonspecific marrow T2 hyperintensity distally in the remaining 4th and 5th metatarsals. Electronically Signed   By: Richardean Sale M.D.   On: 11/02/2021 08:37   DG Foot Complete Right  Result Date: 11/02/2021 CLINICAL DATA:  foot infection on plantar surface, prior amputation for osteo, eval signs of new osteo EXAM: RIGHT FOOT COMPLETE - 3+ VIEW  COMPARISON:  05/03/2021 FINDINGS: Previous transmetatarsal amputation. Interval cortical loss involving the proximal shaft of the fifth metatarsal. There has been some subtle cortical loss at the amputation margin of the fourth metatarsal shaft. There is regional soft tissue swelling. There is plantar subcutaneous gas at the level of the midfoot. IMPRESSION: Findings consistent with fourth and fifth metatarsal osteomyelitis. Electronically Signed   By: Lucrezia Europe M.D.   On: 11/02/2021 05:29   ECHOCARDIOGRAM COMPLETE  Result Date: 11/02/2021    ECHOCARDIOGRAM REPORT   Patient Name:   Valerie Le Date of Exam: 11/02/2021 Medical Rec #:  [id number]              Height:       71.0 in Accession #:    [id number]             Weight:       265.0 lb Date of Birth:  08/01/1970             BSA:          2.376 m Patient Age:    24 years               BP:           120/71 mmHg Patient Gender: F                      HR:           82 bpm. Exam Location:  Inpatient Procedure: 2D Echo, Color Doppler and Cardiac Doppler Indications:    Elevated troponin  History:        Patient has no prior history of Echocardiogram examinations.                 Signs/Symptoms:Chest Pain; Risk Factors:Diabetes, Hypertension,                 Current  Smoker and HLD.  Sonographer:    Joette Catching RCS Referring Phys: [id number] RONDELL A SMITH IMPRESSIONS  1. Left ventricular ejection fraction, by estimation, is 60 to 65%. The left ventricle has normal function. The left ventricle has no regional wall motion abnormalities. Left ventricular diastolic parameters were normal.  2. Right ventricular systolic function is normal. The right ventricular size is normal.  3. The mitral valve is normal in structure. Trivial mitral valve regurgitation.  4. The aortic valve is normal in structure. Aortic valve regurgitation is not visualized. Aortic valve sclerosis is present, with no evidence of aortic valve stenosis. FINDINGS  Left Ventricle: Left ventricular ejection fraction, by estimation, is 60 to 65%. The left ventricle has normal function. The left ventricle has no regional wall motion abnormalities. The left ventricular internal cavity size was normal in size. There is  no left ventricular hypertrophy. Left ventricular diastolic parameters were normal. Right Ventricle: The right ventricular size is normal. No increase in right ventricular wall thickness. Right ventricular systolic function is normal. Left Atrium: Left atrial size was normal in size. Right Atrium: Right atrial size was normal in size. Pericardium: There is no evidence of pericardial effusion. Mitral Valve: The mitral valve is normal in structure. Trivial mitral valve regurgitation. Tricuspid Valve: The tricuspid valve is normal in structure. Tricuspid valve regurgitation is not demonstrated. No evidence of tricuspid stenosis. Aortic Valve: The aortic valve is normal in structure. Aortic valve regurgitation is not visualized. Aortic valve sclerosis is present, with no evidence of aortic valve stenosis. Aortic valve mean gradient measures 9.0 mmHg. Aortic  valve peak gradient measures 19.4 mmHg. Aortic valve area, by VTI measures 2.14 cm. Pulmonic Valve: The pulmonic valve was normal in structure.  Pulmonic valve regurgitation is not visualized. No evidence of pulmonic stenosis. Aorta: The aortic root is normal in size and structure. IAS/Shunts: The interatrial septum was not assessed.  LEFT VENTRICLE PLAX 2D LVIDd:         5.10 cm   Diastology LVIDs:         3.10 cm   LV e' medial:    8.49 cm/s LV PW:         0.90 cm   LV E/e' medial:  10.5 LV IVS:        0.60 cm   LV e' lateral:   13.80 cm/s LVOT diam:     2.00 cm   LV E/e' lateral: 6.5 LV SV:         88 LV SV Index:   37 LVOT Area:     3.14 cm  RIGHT VENTRICLE             IVC RV S prime:     20.70 cm/s  IVC diam: 1.70 cm TAPSE (M-mode): 2.3 cm LEFT ATRIUM           Index LA diam:      3.00 cm 1.26 cm/m LA Vol (A4C): 39.9 ml 16.79 ml/m  AORTIC VALVE                     PULMONIC VALVE AV Area (Vmax):    1.74 cm      PV Vmax:       0.96 m/s AV Area (Vmean):   2.18 cm      PV Peak grad:  3.7 mmHg AV Area (VTI):     2.14 cm AV Vmax:           220.00 cm/s AV Vmean:          138.000 cm/s AV VTI:            0.412 m AV Peak Grad:      19.4 mmHg AV Mean Grad:      9.0 mmHg LVOT Vmax:         122.00 cm/s LVOT Vmean:        95.600 cm/s LVOT VTI:          0.281 m LVOT/AV VTI ratio: 0.68  AORTA Ao Root diam: 2.60 cm Ao Asc diam:  2.90 cm MITRAL VALVE MV Area (PHT): 6.83 cm    SHUNTS MV Decel Time: 111 msec    Systemic VTI:  0.28 m MV E velocity: 89.10 cm/s  Systemic Diam: 2.00 cm Manish Patwardhan MD Electronically signed by Vernell Leep MD Signature Date/Time: 11/02/2021/3:22:00 PM    Final    - pertinent xrays, CT, MRI studies were reviewed and independently interpreted  Positive ROS: All other systems have been reviewed and were otherwise negative with the exception of those mentioned in the HPI and as above.  Physical Exam: General: Alert, no acute distress Psychiatric: Patient is competent for consent with normal mood and affect Lymphatic: No axillary or cervical lymphadenopathy Cardiovascular: No pedal edema Respiratory: No cyanosis, no use of  accessory musculature GI: No organomegaly, abdomen is soft and non-tender    Images:  @ENCIMAGES @  Labs:  Lab Results  Component Value Date   HGBA1C 13.1 (H) 12/02/2020   ESRSEDRATE 84 (H) 11/02/2021   ESRSEDRATE 72 (H) 06/15/2021   CRP 24.7 (H) 06/15/2021  REPTSTATUS PENDING 11/02/2021   GRAMSTAIN  06/29/2021    RARE WBC PRESENT, PREDOMINANTLY MONONUCLEAR NO ORGANISMS SEEN    CULT  11/02/2021    NO GROWTH < 24 HOURS Performed at [facility] [address] [place], [place] [address]    LABORGA STAPHYLOCOCCUS EPIDERMIDIS 06/29/2021   LABORGA STAPHYLOCOCCUS LUGDUNENSIS 06/29/2021    Lab Results  Component Value Date   ALBUMIN 2.1 (L) 11/03/2021   ALBUMIN 2.6 (L) 12/03/2020   ALBUMIN 2.9 (L) 12/02/2020        Latest Ref Rng & Units 11/03/2021   12:50 AM 11/01/2021    9:16 PM 06/15/2021    4:39 PM  CBC EXTENDED  WBC 4.0 - 10.5 K/uL 16.8   20.2   11.7    RBC 3.87 - 5.11 MIL/uL 3.84   4.41   4.52    Hemoglobin 12.0 - 15.0 g/dL 8.6   9.9   10.8    HCT 36.0 - 46.0 % 27.2   31.4   33.4    Platelets 150 - 400 K/uL 372   431   399      Neurologic: Patient does not have protective sensation bilateral lower extremities. Team 0.1.  MUSCULOSKELETAL:   Skin: Examination the blistered skin was removed skin and soft tissue was debrided back to healthy viable granulation tissue.  The area of granulation tissue is about 5 cm in diameter.  There is 1 necrotic ulcer over the cuboid approximately 1 cm diameter.  Patient has a palpable dorsalis pedis pulse.  Review of the MRI scan is concerning for possible osteomyelitis deep to the ulcer.  Patient's white cell count has dropped 20.2 on admission to currently 16.8.  Albumin 2.1.  Hemoglobin A1c  Assessment: Assessment ulceration and infection in the lateral aspect of the right foot status post transmetatarsal amputation with possible osteomyelitis.  Plan: Would continue the IV antibiotics through her hospital stay  and discharged on oral doxycycline.  I will follow-up in the office in 1 week.  Discussed with the patient if this is not showing improvement with antibiotics we would need to consider a below-knee amputation.  Thank you for the consult and the opportunity to see Ms. Le  Meridee Score, MD [facility] [phone] 7:48 AM

## 2021-11-03 NOTE — Progress Notes (Signed)
Relayed to the pt the result of stress test is normal as per  Dr. Maryfrances Bunnell and that the chest pain is not coming from the  heart. And also made aware the Dr. Lajoyce Corners will see her as out patient. and change the antibiotic to po on discharge .

## 2021-11-03 NOTE — Progress Notes (Signed)
Back from nuclear med by bed awake and alert. 

## 2021-11-03 NOTE — Progress Notes (Signed)
Transported to nuclear med. by bed for stress test.

## 2021-11-03 NOTE — Hospital Course (Signed)
Valerie Le is a 51 y.o. 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  Intermittent burning substernal chest pain for several days, radiating to neck and arms.  Also, recent hematoma on R foot I&D'd in the office, now draining, painful and swollen.  In the ER, initial hs troponin 18, EKG nonacute.  MRI right foot showed cuboid/5th metatarsal osteomyelitis and cellulitis.   5/17: Admitted, Cardiology and Ortho consulted

## 2021-11-03 NOTE — Progress Notes (Signed)
Right foot ulcer cleansed and non-adherent dressing applied and wrapped with  kerlix, tolerated well.

## 2021-11-03 NOTE — Assessment & Plan Note (Signed)
Continue atorvastatin

## 2021-11-03 NOTE — Progress Notes (Signed)
Progress Note   Patient: Valerie Le [id number] DOB: May 07, 1971 DOA: 11/01/2021     1 DOS: the patient was seen and examined on 11/03/2021 at 12:15 PM      Brief hospital course: Valerie Le is a 51 y.o. 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  Intermittent burning substernal chest pain for several days, radiating to neck and arms.  Also, recent hematoma on R foot I&D'd in the office, now draining, painful and swollen.  In the ER, initial hs troponin 18, EKG nonacute.  MRI right foot showed cuboid/5th metatarsal osteomyelitis and cellulitis.   5/17: Admitted, Cardiology and Ortho consulted     Assessment and Plan: * Chest pain This is typical and atypical features, the ECG is normal and high-sensitivity troponins are reassuring.  Cardiology were consulted and recommended nuclear stress. - Follow-up nuclear stress test - Continue nitroglycerin as needed  Elevated troponin See above  Osteomyelitis of foot, right, acute (HCC) - Continue vancomycin, ceftriaxone and Flagyl, plan to discharge on doxycycline  SIRS (systemic inflammatory response syndrome) (HCC) Sepsis was not suspected on admission, she has no evidence of endorgan damage to support this diagnosis.  Hypokalemia - Supplement potassium  Microcytic hypochromic anemia - Check iron stores  Essential hypertension Blood pressure controlled - Continue lisinopril, amlodipine  Diabetes mellitus type 1 (HCC) Glucose controlled.  Complicated by polyneuropathy.  A1c is 7%, well controlled overall. - Continue glargine - Continue sliding scale corrections - Continue gabapentin  Hyperlipidemia - Continue atorvastatin  Morbid obesity (HCC) BMI 37 in the setting of hypertension, diabetes  Hypoalbuminemia Poor prognostic indicator for surgery  Smoking - Continue  Chantix   Diabetic foot infection (Valerie Le) See above          Subjective: No headache, fever, confusion.     Physical Exam: Vitals:   11/03/21 0944 11/03/21 0946 11/03/21 0947 11/03/21 1040  BP: 134/66 130/65  (!) 148/83  Pulse: (!) 106 (!) 104 (!) 102   Resp:    17  Temp:    98.6 F (37 C)  TempSrc:    Oral  SpO2:    96%  Weight:      Height:       Adult female, lying in bed, no acute distress, interactive, appropriate Slightly tachycardic, no murmurs, no peripheral edema Respiratory rate normal, lungs clear without rales or wheezes The right foot has breakdown on the heel of the foot, with minimal surrounding redness and cellulitis. Attention normal, affect appropriate, judgment Syprine normal  Data Reviewed: Orthopedics and cardiology notes reviewed, vital signs reviewed, nursing notes reviewed Echocardiogram showed normal EF, no regional wall motion abnormalities Albumin 2.1 Glucose normal Creatinine and potassium improved Reports cell count 20 down to 16 Hemoglobin A1c 7% Hemoglobin 8 and microcytic, down from previous       Disposition: Status is: Inpatient Remains inpatient appropriate because: Patient remains tachycardic in the setting of her heel infection, possible osteomyelitis.  She is also in the midst of a work-up for angina.  We will get the results of her cardiac stress test, if this is normal, we will continue IV antibiotics, monitor for another day or 2 and see if her heart rate improves on antibiotics, then likely discharge with doxycycline and PCP follow-up          Author: Edwin Dada, MD 11/03/2021 2:01 PM  For on  call review [email or web address].

## 2021-11-03 NOTE — Assessment & Plan Note (Signed)
Glucose controlled.  Complicated by polyneuropathy.  A1c is 7%, well controlled overall. - Continue glargine - Continue sliding scale corrections - Continue gabapentin

## 2021-11-03 NOTE — Assessment & Plan Note (Signed)
-   Check iron stores

## 2021-11-03 NOTE — Assessment & Plan Note (Signed)
Poor prognostic indicator for surgery

## 2021-11-03 NOTE — Assessment & Plan Note (Signed)
This is typical and atypical features, the ECG is normal and high-sensitivity troponins are reassuring.  Cardiology were consulted and recommended nuclear stress. - Follow-up nuclear stress test - Continue nitroglycerin as needed

## 2021-11-03 NOTE — Evaluation (Signed)
Physical Therapy Evaluation Patient Details Name: Valerie Le MRN: [id number] DOB: 22-Feb-1971 Today's Date: 11/03/2021  History of Present Illness  Pt is a 51 y.o. female who presented 11/01/21 with x4 day hx of chest pain and increased R foot wound drainage. EKG and troponins are reassuring for absence of ACS. MRI scan is concerning for possible osteomyelitis deep to the ulcer on the R foot. PMH: hypertension, hyperlipidemia, diabetes mellitus type 1, obesity, and osteomyelitis of the right foot s/p ray amputation right foot with subsequent revision 06/2021   Clinical Impression  Pt presents with condition above and deficits mentioned below, see PT Problem List. PTA, she was independent, only using her cane when the R foot pain would increase. Pt lives with her family in a 1-level apartment with 3 STE. 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 Pt is currently limited primarily by R heel pain, resulting in her ambulating with an antalgic gait pattern, avoiding heel weight bearing. Secondary to this altered gait pattern and pain, she does display some mild balance deficits, benefiting from occasional 1 UE support for stability and pain management. Pt also with limitations in R foot sensation and R ankle ROM. Pt is not far from her baseline and I suspect she will progress quickly provided the pain improved. Will continue to follow acutely to maximize her return to baseline prior to d/c.        Recommendations for follow up therapy are one component of a multi-disciplinary discharge planning process, led by the attending physician.  Recommendations may be updated based on patient status, additional functional criteria and insurance authorization.  Follow Up Recommendations No PT follow up    Assistance Recommended at Discharge PRN  Patient can return home with the following  Help with stairs or ramp for  entrance;Assist for transportation;Assistance with cooking/housework    Equipment Recommendations None recommended by PT  Recommendations for Other Services       Functional Status Assessment Patient has had a recent decline in their functional status and demonstrates the ability to make significant improvements in function in a reasonable and predictable amount of time.     Precautions / Restrictions Precautions Precautions: Fall Precaution Comments: prior R transmet amputation Restrictions Weight Bearing Restrictions: No      Mobility  Bed Mobility Overal bed mobility: Modified Independent             General bed mobility comments: Pt able to transition supine <> sit EOB with HOB elevated without assistance.    Transfers Overall transfer level: Needs assistance Equipment used: None Transfers: Sit to/from Stand Sit to Stand: Supervision           General transfer comment: cues to gain momentum via rocking trunk anteriorly, comes to stand x2 reps from EOB without LOB, supervision for safety    Ambulation/Gait Ambulation/Gait assistance: Supervision, Min guard Gait Distance (Feet): 200 Feet Assistive device: None, IV Pole Gait Pattern/deviations: Step-through pattern, Decreased stance time - right, Antalgic, Trunk flexed, Decreased dorsiflexion - right, Decreased stride length Gait velocity: reduced Gait velocity interpretation: <1.31 ft/sec, indicative of household ambulator   General Gait Details: Pt with slow, antalgic gait pattern due to R heel pain. Pt maintains ankle plantarflexion on R with only mid-foot contact, avoiding heel weight bearing due to pain. Pt initially with IV pole for support, but able to progress to no UE support (still intermittently reaching  for wall rail though), no LOB, min guard-supervision for safety.  Stairs            Wheelchair Mobility    Modified Rankin (Stroke Patients Only)       Balance Overall balance  assessment: Mild deficits observed, not formally tested                                           Pertinent Vitals/Pain Pain Assessment Pain Assessment: 0-10 Pain Score: 2  Pain Location: R heel Pain Descriptors / Indicators: Dull, Aching Pain Intervention(s): Limited activity within patient's tolerance, Monitored during session, Repositioned    Home Living Family/patient expects to be discharged to:: Private residence Living Arrangements: Spouse/significant other;Children (x3 teenagers) Available Help at Discharge: Family;Available 24 hours/day Type of Home: Apartment Home Access: Stairs to enter Entrance Stairs-Rails: Can reach both Entrance Stairs-Number of Steps: 3   Home Layout: One level Home Equipment: Cane - quad;Shower seat      Prior Function Prior Level of Function : Independent/Modified Independent;Driving;Working/employed             Mobility Comments: Uses SPC intermittently only when R foot pain is bad, otherwise does not use AD. No falls. 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     Hand Dominance   Dominant Hand: Right    Extremity/Trunk Assessment   Upper Extremity Assessment Upper Extremity Assessment: Overall WFL for tasks assessed    Lower Extremity Assessment Lower Extremity Assessment: RLE deficits/detail RLE Deficits / Details: prior R transmet amputation with bandages, edema, and pain limiting ankle ROM; decreased sensation at foot, intact elsewhere RLE Sensation: decreased light touch    Cervical / Trunk Assessment Cervical / Trunk Assessment: Normal  Communication   Communication: No difficulties  Cognition Arousal/Alertness: Awake/alert Behavior During Therapy: WFL for tasks assessed/performed Overall Cognitive Status: Within Functional Limits for tasks assessed                                          General Comments      Exercises     Assessment/Plan    PT  Assessment Patient needs continued PT services  PT Problem List Decreased range of motion;Decreased activity tolerance;Decreased balance;Decreased mobility;Impaired sensation;Pain;Decreased skin integrity       PT Treatment Interventions DME instruction;Gait training;Stair training;Functional mobility training;Therapeutic activities;Therapeutic exercise;Balance training;Neuromuscular re-education;Patient/family education    PT Goals (Current goals can be found in the Care Plan section)  Acute Rehab PT Goals Patient Stated Goal: to get back to work PT Goal Formulation: With patient/family Time For Goal Achievement: 11/17/21 Potential to Achieve Goals: Good    Frequency Min 3X/week     Co-evaluation               AM-PAC PT "6 Clicks" Mobility  Outcome Measure Help needed turning from your back to your side while in a flat bed without using bedrails?: None Help needed moving from lying on your back to sitting on the side of a flat bed without using bedrails?: None Help needed moving to and from a bed to a chair (including a wheelchair)?: A Little Help needed standing up from a chair using your arms (e.g., wheelchair or bedside chair)?: A Little Help needed to walk in hospital room?: A Little Help needed  climbing 3-5 steps with a railing? : A Little 6 Click Score: 20    End of Session   Activity Tolerance: Patient tolerated treatment well Patient left: in bed;with call bell/phone within reach;with bed alarm set;with family/visitor present Nurse Communication: Mobility status PT Visit Diagnosis: Unsteadiness on feet (R26.81);Other abnormalities of gait and mobility (R26.89);Difficulty in walking, not elsewhere classified (R26.2);Pain Pain - Right/Left: Right Pain - part of body: Ankle and joints of foot    Time: 1530-1549 PT Time Calculation (min) (ACUTE ONLY): 19 min   Charges:   PT Evaluation $PT Eval Low Complexity: 1 Low          Moishe Spice, PT, DPT Acute  Rehabilitation Services  Pager: [phone] Office: [phone]   Orvan Falconer 11/03/2021, 4:03 PM

## 2021-11-03 NOTE — Progress Notes (Signed)
Stress test noted. Chest pain likely noncardiac. Low cardiac risk for upcoming amputation surgery. Cardiology will sign off.   Elder Negus, MD Pager: [phone] Office: [phone]

## 2021-11-03 NOTE — Assessment & Plan Note (Signed)
Treated and resolved 

## 2021-11-03 NOTE — Assessment & Plan Note (Signed)
-   Continue vancomycin, ceftriaxone and Flagyl, plan to discharge on doxycycline

## 2021-11-03 NOTE — Assessment & Plan Note (Signed)
Sepsis was not suspected on admission, she has no evidence of endorgan damage to support this diagnosis.

## 2021-11-03 NOTE — Assessment & Plan Note (Signed)
BMI 37 in the setting of hypertension, diabetes

## 2021-11-04 ENCOUNTER — Inpatient Hospital Stay (HOSPITAL_COMMUNITY): Payer: Managed Care, Other (non HMO)

## 2021-11-04 ENCOUNTER — Telehealth: Payer: Self-pay | Admitting: Orthopedic Surgery

## 2021-11-04 ENCOUNTER — Other Ambulatory Visit (HOSPITAL_COMMUNITY): Payer: Self-pay

## 2021-11-04 ENCOUNTER — Encounter (HOSPITAL_COMMUNITY): Payer: Managed Care, Other (non HMO)

## 2021-11-04 ENCOUNTER — Telehealth: Payer: Self-pay

## 2021-11-04 DIAGNOSIS — R778 Other specified abnormalities of plasma proteins: Secondary | ICD-10-CM | POA: Diagnosis not present

## 2021-11-04 DIAGNOSIS — R079 Chest pain, unspecified: Secondary | ICD-10-CM | POA: Diagnosis not present

## 2021-11-04 DIAGNOSIS — E11628 Type 2 diabetes mellitus with other skin complications: Secondary | ICD-10-CM | POA: Diagnosis not present

## 2021-11-04 DIAGNOSIS — E1069 Type 1 diabetes mellitus with other specified complication: Secondary | ICD-10-CM | POA: Diagnosis not present

## 2021-11-04 LAB — CBC
HCT: 26.5 % — ABNORMAL LOW (ref 36.0–46.0)
Hemoglobin: 8.7 g/dL — ABNORMAL LOW (ref 12.0–15.0)
MCH: 23.1 pg — ABNORMAL LOW (ref 26.0–34.0)
MCHC: 32.8 g/dL (ref 30.0–36.0)
MCV: 70.3 fL — ABNORMAL LOW (ref 80.0–100.0)
Platelets: 348 10*3/uL (ref 150–400)
RBC: 3.77 MIL/uL — ABNORMAL LOW (ref 3.87–5.11)
RDW: 17.9 % — ABNORMAL HIGH (ref 11.5–15.5)
WBC: 18.4 10*3/uL — ABNORMAL HIGH (ref 4.0–10.5)
nRBC: 0 % (ref 0.0–0.2)

## 2021-11-04 LAB — GLUCOSE, CAPILLARY
Glucose-Capillary: 206 mg/dL — ABNORMAL HIGH (ref 70–99)
Glucose-Capillary: 229 mg/dL — ABNORMAL HIGH (ref 70–99)

## 2021-11-04 LAB — BASIC METABOLIC PANEL
Anion gap: 8 (ref 5–15)
BUN: 12 mg/dL (ref 6–20)
CO2: 23 mmol/L (ref 22–32)
Calcium: 8.1 mg/dL — ABNORMAL LOW (ref 8.9–10.3)
Chloride: 104 mmol/L (ref 98–111)
Creatinine, Ser: 1.18 mg/dL — ABNORMAL HIGH (ref 0.44–1.00)
GFR, Estimated: 56 mL/min — ABNORMAL LOW (ref >60)
Glucose, Bld: 148 mg/dL — ABNORMAL HIGH (ref 70–99)
Potassium: 3.9 mmol/L (ref 3.5–5.1)
Sodium: 135 mmol/L (ref 135–145)

## 2021-11-04 LAB — IRON AND TIBC
Iron: 16 ug/dL — ABNORMAL LOW (ref 28–170)
Saturation Ratios: 8 % — ABNORMAL LOW (ref 10.4–31.8)
TIBC: 202 ug/dL — ABNORMAL LOW (ref 250–450)
UIBC: 186 ug/dL

## 2021-11-04 IMAGING — DX DG CHEST 1V PORT
1 series · 1 of 1 positions shown · non-contrast
Comparison: [DATE].

CLINICAL DATA: Hypoxia.

EXAM:
PORTABLE CHEST 1 VIEW

[chest ap]
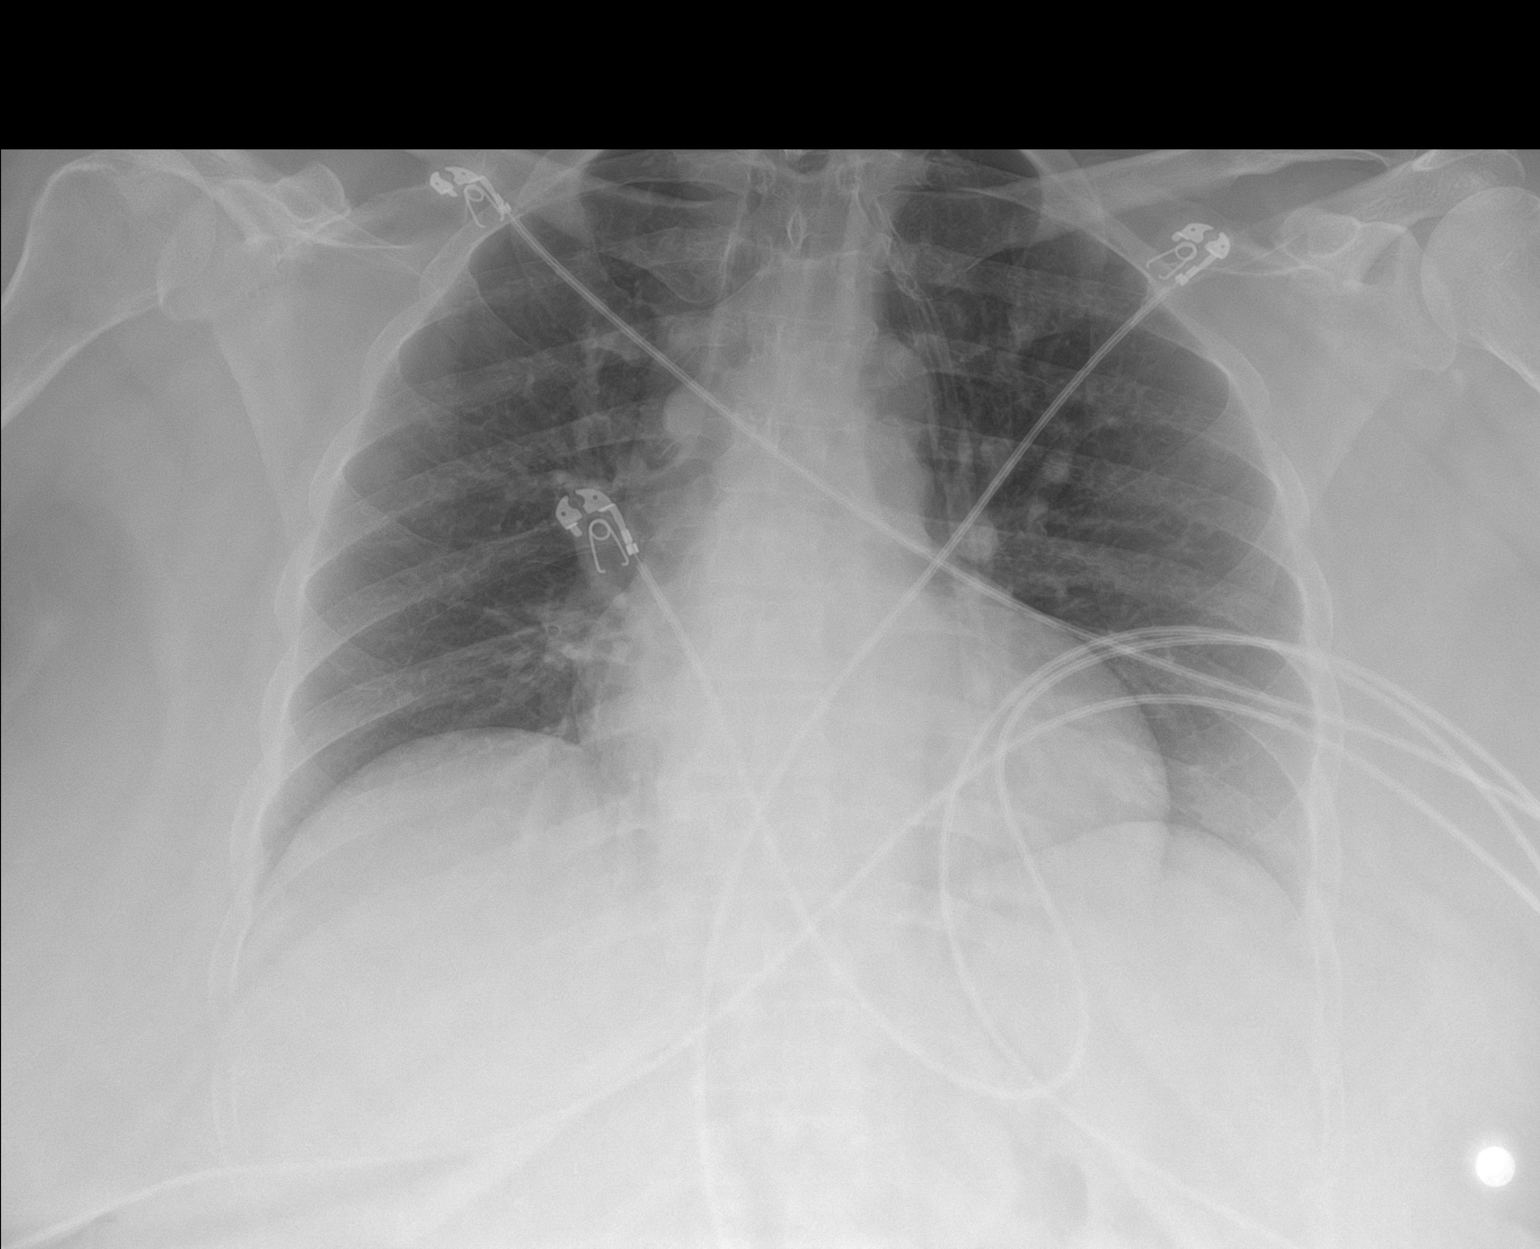

[1 of 1 positions shown; findings below may reference images not displayed]

FINDINGS: The heart size and mediastinal contours are within normal limits.
Both lungs are clear. The visualized skeletal structures are
unremarkable.
IMPRESSION: No active disease.

## 2021-11-04 MED ORDER — DOXYCYCLINE HYCLATE 100 MG PO TABS
100.0000 mg | ORAL_TABLET | Freq: Two times a day (BID) | ORAL | 0 refills | Status: DC
  Filled 2021-11-04: qty 20, 10d supply, fill #0

## 2021-11-04 MED ORDER — OXYCODONE-ACETAMINOPHEN 5-325 MG PO TABS
1.0000 | ORAL_TABLET | Freq: Four times a day (QID) | ORAL | 0 refills | Status: DC | PRN
  Filled 2021-11-04: qty 20, 5d supply, fill #0

## 2021-11-04 MED ORDER — FERROUS SULFATE 325 (65 FE) MG PO TABS
325.0000 mg | ORAL_TABLET | Freq: Two times a day (BID) | ORAL | 3 refills | Status: DC
  Filled 2021-11-04: qty 60, 30d supply, fill #0

## 2021-11-04 NOTE — Telephone Encounter (Signed)
Dr.Danford from the hospital called. He is discharging patient for Dr.[name]. He said that patient never got the ABI done, due to them being backed up. He wanted Dr.[name] aware in case he still wants patient to have them done elsewhere.

## 2021-11-04 NOTE — Discharge Summary (Signed)
Physician Discharge Summary   Patient: Valerie Le MRN: [id number] DOB: 1970-10-15  Admit date:     11/01/2021  Discharge date: 11/04/21  Discharge Physician: Alberteen Sam   PCP: Maud Deed, PA     Recommendations at discharge:  Follow up with Orthopedics Dr. Lajoyce Corners in 1 week for right foot infection Follow up with PCP Maud Deed in 2-3 weeks [name] Howley: Please obtain iron studies and Hgb in 2 months     Discharge Diagnoses: Principal Problem:   Suspected Osteomyelitis of foot, right, acute (HCC) Active Problems:   Diabetic foot infection (HCC)   Chest pain   Elevated troponin   SIRS (systemic inflammatory response syndrome) (HCC)   Hypokalemia   Iron deficiency anemia   Essential hypertension   Diabetes mellitus type 1 (HCC)   Hyperlipidemia   Morbid obesity (HCC)   Smoking   Hypoalbuminemia      Hospital Course: Valerie Le is a 51 y.o. 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  The patient had noted intermittent burning substernal chest pain for several days, radiating to neck and arms.  Also, recent hematoma on R foot I&D'd in the office, now draining, painful and swollen.  In the ER, initial hs troponin 18, EKG nonacute.  MRI right foot showed cuboid/5th metatarsal possible osteomyelitis as well as cellulitis.   * Likely noncardiac Chest pain Elevated troponin due to nonischemic myocardial injury Patient was admitted and evaluted by Cardiology.  Her EKG was nonacute and troponins showed a single elevated hsT level.  She underwent nuclear stress testing that was low risk, showed no reversible ischemia.      Suspected osteomyelitis of foot, right, acute  Diabetic foot infection She was admitted and started on broad spectrum antibiotics.  Orthopedics evaluated the foot and recommended continued  antibiotics, narrowed to doxycycline at discharge and close outpatient follow up to determine need for debridement versus BKA.    Iron deficiency anemia due to chronic blood loss Iron saturation low.  Discharged on oral iron, needs close follow up.  SIRS (systemic inflammatory response syndrome) (HCC) Sepsis was not suspected on admission, she has no evidence of endorgan damage to support this diagnosis. Sepsis ruled out.  Smoking Smoking cessation recommended. Continue Chantix.              The [facility] Controlled Substances Registry was reviewed for this patient prior to discharge.   Consultants: Orthopedics, Cardiology Procedures performed: Echo, Nuclear stress test, MRI foot  Disposition: Home   DISCHARGE MEDICATION: Allergies as of 11/04/2021       Reactions   Albiglutide Itching   Itching site reaction with burning, itching and pain   Cherry Anaphylaxis   Coconut (cocos Nucifera) Anaphylaxis   Pineapple Anaphylaxis   Tomato Anaphylaxis   Glimepiride Other (See Comments)   Numbness and abdominal pain   Latex Hives   Metformin And Related Other (See Comments)   Constipation/cramps   Glipizide Diarrhea        Medication List     STOP taking these medications    amoxicillin-clavulanate 875-125 MG tablet Commonly known as: AUGMENTIN   sulfamethoxazole-trimethoprim 800-160 MG tablet Commonly known as: BACTRIM DS       TAKE these medications    amitriptyline 25 MG tablet Commonly known as: ELAVIL Take 25 mg by mouth at bedtime.   amLODipine 10 MG  tablet Commonly known as: NORVASC Take 10 mg by mouth at bedtime.   ammonium lactate 12 % cream Commonly known as: AMLACTIN Apply 1 application topically daily as needed for dry skin (adter showers).   aspirin EC 81 MG tablet Take 81 mg by mouth at bedtime.   atorvastatin 80 MG tablet Commonly known as: LIPITOR Take 80 mg by mouth at bedtime.   Baqsimi One Pack 3 MG/DOSE Powd Generic  drug: Glucagon Place 3 mg into the nose as needed (for blood sugar 60 or below).   budesonide 180 MCG/ACT inhaler Commonly known as: PULMICORT Inhale 1 puff into the lungs 3 (three) times daily as needed (shortness of breath).   D3-1000 25 MCG (1000 UT) capsule Generic drug: Cholecalciferol Take 1,000 Units by mouth at bedtime.   Dexcom G6 Receiver Devi change EVERY 90 days   Dexcom G6 Sensor Misc use to check blood sugar change every 10 days   Dexcom G6 Transmitter Misc Dispense and use as directed   doxycycline 100 MG tablet Commonly known as: VIBRA-TABS Take 1 tablet (100 mg total) by mouth 2 (two) times daily. What changed:  when to take this additional instructions   ferrous sulfate 325 (65 FE) MG tablet Take 1 tablet (325 mg total) by mouth 2 (two) times daily.   Fish Oil 1000 MG Caps Take 1,000 mg by mouth at bedtime.   gabapentin 300 MG capsule Commonly known as: NEURONTIN Take 1 capsule (300 mg total) by mouth at bedtime. 3 times a day when necessary neuropathy pain- prn What changed: additional instructions   ibuprofen 200 MG tablet Commonly known as: ADVIL Take 600 mg by mouth every 6 (six) hours as needed for mild pain.   insulin pump Soln Inject into the skin continuous. 200 units every three days   L-Arginine 1000 MG Tabs Take 1,000 mg by mouth daily.   lisinopril 40 MG tablet Commonly known as: ZESTRIL Take 40 mg by mouth at bedtime.   metoCLOPramide 10 MG tablet Commonly known as: REGLAN Take 10 mg by mouth every 6 (six) hours as needed for nausea.   nitroGLYCERIN 0.2 mg/hr patch Commonly known as: NITRODUR - Dosed in mg/24 hr Place 1 patch (0.2 mg total) onto the skin daily.   ondansetron 4 MG tablet Commonly known as: ZOFRAN Take 1 tablet (4 mg total) by mouth every 6 (six) hours as needed for nausea.   oxyCODONE-acetaminophen 5-325 MG tablet Commonly known as: PERCOCET/ROXICET Take 1 tablet by mouth every 6 (six) hours as  needed. What changed: when to take this   sodium chloride 0.65 % nasal spray Commonly known as: OCEAN Place 1-2 sprays into the nose as needed for congestion.   SUMAtriptan 25 MG tablet Commonly known as: IMITREX Take 25 mg by mouth every 2 (two) hours as needed for migraine. May repeat in 2 hours if headache persists or recurs.   Toujeo Max SoloStar 300 UNIT/ML Solostar Pen Generic drug: insulin glargine (2 Unit Dial) Inject 300 Units into the skin once a week.   triamcinolone 0.025 % cream Commonly known as: KENALOG 1 application. daily as needed (rash).   Trulicity 0.75 MG/0.5ML Sopn Generic drug: Dulaglutide Inject 0.75 mg into the skin every Sunday.   Unifine Pentips Plus 31G X 5 MM Misc Generic drug: Insulin Pen Needle use 1 pen needle 5 times per day   varenicline 1 MG tablet Commonly known as: CHANTIX Take 1 mg by mouth at bedtime.   vitamin C 500 MG tablet  Commonly known as: ASCORBIC ACID Take 500 mg by mouth at bedtime.               Discharge Care Instructions  (From admission, onward)           Start     Ordered   11/04/21 0000  Discharge wound care:       Comments: Place a non-stick dressing over the wound.  Wrap with gauze and cover with ACE wrap. Change dressing once daily or as needed   11/04/21 1231            Follow-up Information     [name], MD. Call.   Specialty: Orthopedic Surgery Why: Call this afternoon to confirm your follow up appointment next week Contact information: [address] [place] [address] [phone]                 Discharge Instructions     Discharge instructions   Complete by: As directed    From Dr. Maryfrances Bunnell: You were admitted for infection in the foot.  We are concerned that there may be infection in the bone. While you were here, you had an MRI of the foot, that showed infection, but was indeterminate if the infection was in bone. You should take doxycycline 100 mg twice  daily for at least 10 days Go see Dr. Lajoyce Corners in 1 week (call his office this afternoon to confirm the time of your appointment)  If the foot is not improving at that follow up appointment, discuss next steps with Dr. Lajoyce Corners at that time   For your heart, you had a nuclear stress test while you were here.  This showed no signs of blockages and was a reassuring test.  For the anemia: Take ferrous sulfate 325 mg (an iron supplement) twice daily Take it with a stool softener like docusate/Colace You may also take it every other day, as this also seems to promote absorption  Have your primary doctor check your iron levels and hemoglobin in 2 months   Discharge wound care:   Complete by: As directed    Place a non-stick dressing over the wound.  Wrap with gauze and cover with ACE wrap. Change dressing once daily or as needed   Increase activity slowly   Complete by: As directed        Discharge Exam: Filed Weights   11/01/21 2041 11/02/21 1546  Weight: 120.2 kg 123.4 kg    General: Pt is alert, awake, not in acute distress Cardiovascular: RRR, nl S1-S2, no murmurs appreciated.   No LE edema.   Respiratory: Normal respiratory rate and rhythm.  CTAB without rales or wheezes. MSK: The right foot has minimal redness and swelling, it has a foul odor but the wound bed appears fresh and has slight bleeding. Abdominal: Abdomen soft and non-tender.  No distension or HSM.   Neuro/Psych: Strength symmetric in upper and lower extremities.  Judgment and insight appear normal.   Condition at discharge: good  The results of significant diagnostics from this hospitalization (including imaging, microbiology, ancillary and laboratory) are listed below for reference.   Imaging Studies: DG Chest 2 View  Result Date: 11/01/2021 CLINICAL DATA:  Chest pain, shortness of breath EXAM: CHEST - 2 VIEW COMPARISON:  None Available. FINDINGS: Lungs are clear.  No pleural effusion or pneumothorax. The heart is  normal in size. Visualized osseous structures are within normal limits. IMPRESSION: Normal chest radiographs. Electronically Signed   By: Charline Bills  M.D.   On: 11/01/2021 21:30   MR FOOT RIGHT WO CONTRAST  Result Date: 11/02/2021 CLINICAL DATA:  Diabetic with right foot swelling. History of partial amputation for osteomyelitis. Transmetatarsal amputation 12/22/2020 with revision 06/29/2021. EXAM: MRI OF THE RIGHT FOREFOOT WITHOUT CONTRAST TECHNIQUE: Multiplanar, multisequence MR imaging of the right foot was performed. No intravenous contrast was administered. COMPARISON:  MRI 12/03/2020. Radiographs 11/02/2021, 05/03/2021 and 12/02/2020. FINDINGS: Bones/Joint/Cartilage Examination includes most of the remaining foot status post transmetatarsal amputation. Compared with the previous MRI, there is new subchondral marrow T2 hyperintensity and decreased T1 signal within the base of the 5th metatarsal and the adjacent cuboid. No gross cortical destruction. There is mild marrow T2 hyperintensity within the remaining base of the 5th metatarsal and distally within the remaining 4th metatarsal. No significant abnormalities and the additional metatarsal bases or tarsal bones. No significant joint effusions. Ligaments Intact Lisfranc ligament. Muscles and Tendons Generalized muscular atrophy. Mild T2 hyperintensity surrounding the flexor tendons in the proximal forefoot. No significant focal intramuscular fluid collections. Soft tissues Plantar soft tissue ulceration laterally along the base of the 5th metatarsal with underlying emphysema, decreased T1 signal and heterogeneous T2 hyperintensity in the surrounding soft tissues. These soft tissue changes abut the 5th tarsometatarsal articulation, such that the underlying marrow changes are at least moderately suspicious for osteomyelitis. No drainable soft tissue abscess. IMPRESSION: 1. Soft tissue ulceration along the plantar base of the 5th metatarsal with  underlying soft tissue emphysema and inflammatory changes consistent with soft tissue infection. No drainable abscess identified. 2. Marrow changes in the 5th metatarsal base and adjacent cuboid have developed from the previous MRI of 11 months ago, and given proximity to the adjacent soft tissue findings could reflect osteomyelitis. No cortical destruction or significant joint effusions. 3. Previous transmetatarsal amputation through the forefoot with mild nonspecific marrow T2 hyperintensity distally in the remaining 4th and 5th metatarsals. Electronically Signed   By: Carey Bullocks M.D.   On: 11/02/2021 08:37   NM Myocar Multi W/Spect Izetta Dakin Motion / EF  Result Date: 11/03/2021 CLINICAL DATA:  Chest pain. Intermediate risk coronary artery disease. EXAM: MYOCARDIAL IMAGING WITH SPECT (REST AND PHARMACOLOGIC-STRESS) GATED LEFT VENTRICULAR WALL MOTION STUDY LEFT VENTRICULAR EJECTION FRACTION TECHNIQUE: Standard myocardial SPECT imaging was performed after resting intravenous injection of 10.4 mCi Tc-64m tetrofosmin. Subsequently, intravenous infusion of Lexiscan was performed under the supervision of the Cardiology staff. At peak effect of the drug, 31.4 mCi Tc-69m tetrofosmin was injected intravenously and standard myocardial SPECT imaging was performed. Quantitative gated imaging was also performed to evaluate left ventricular wall motion, and estimate left ventricular ejection fraction. COMPARISON:  None Available. FINDINGS: Perfusion: No decreased activity in the left ventricle on stress imaging to suggest reversible ischemia or infarction. Wall Motion: Normal left ventricular wall motion. No left ventricular dilation. Left Ventricular Ejection Fraction: 74 % End diastolic volume 99 ml End systolic volume 25 ml IMPRESSION: 1. No reversible ischemia or infarction. 2. Normal left ventricular wall motion. 3. Left ventricular ejection fraction 74% 4. Non invasive risk stratification*: Low *2012 Appropriate Use  Criteria for Coronary Revascularization Focused Update: J Am Coll Cardiol. 2012;59(9):857-881. http://content.dementiazones.com.aspx?articleid=[email or web address] Electronically Signed   By: Danae Orleans M.D.   On: 11/03/2021 14:30   DG CHEST PORT 1 VIEW  Result Date: 11/04/2021 CLINICAL DATA:  Hypoxia. EXAM: PORTABLE CHEST 1 VIEW COMPARISON:  Nov 01, 2021. FINDINGS: The heart size and mediastinal contours are within normal limits. Both lungs are clear. The visualized skeletal structures are unremarkable. IMPRESSION:  No active disease. Electronically Signed   By: Lupita Raider M.D.   On: 11/04/2021 11:13   DG Foot Complete Right  Result Date: 11/02/2021 CLINICAL DATA:  foot infection on plantar surface, prior amputation for osteo, eval signs of new osteo EXAM: RIGHT FOOT COMPLETE - 3+ VIEW COMPARISON:  05/03/2021 FINDINGS: Previous transmetatarsal amputation. Interval cortical loss involving the proximal shaft of the fifth metatarsal. There has been some subtle cortical loss at the amputation margin of the fourth metatarsal shaft. There is regional soft tissue swelling. There is plantar subcutaneous gas at the level of the midfoot. IMPRESSION: Findings consistent with fourth and fifth metatarsal osteomyelitis. Electronically Signed   By: Corlis Leak M.D.   On: 11/02/2021 05:29   ECHOCARDIOGRAM COMPLETE  Result Date: 11/02/2021    ECHOCARDIOGRAM REPORT   Patient Name:   ASHAUNTE STANDLEY GASKIN-FISCHER Date of Exam: 11/02/2021 Medical Rec #:  [id number]              Height:       71.0 in Accession #:    [id number]             Weight:       265.0 lb Date of Birth:  [birth year]             BSA:          2.376 m Patient Age:    50 years               BP:           120/71 mmHg Patient Gender: F                      HR:           82 bpm. Exam Location:  Inpatient Procedure: 2D Echo, Color Doppler and Cardiac Doppler Indications:    Elevated troponin  History:        Patient has no prior history of Echocardiogram examinations.                  Signs/Symptoms:Chest Pain; Risk Factors:Diabetes, Hypertension,                 Current Smoker and HLD.  Sonographer:    Rodrigo Ran RCS Referring Phys: [phone] RONDELL A SMITH IMPRESSIONS  1. Left ventricular ejection fraction, by estimation, is 60 to 65%. The left ventricle has normal function. The left ventricle has no regional wall motion abnormalities. Left ventricular diastolic parameters were normal.  2. Right ventricular systolic function is normal. The right ventricular size is normal.  3. The mitral valve is normal in structure. Trivial mitral valve regurgitation.  4. The aortic valve is normal in structure. Aortic valve regurgitation is not visualized. Aortic valve sclerosis is present, with no evidence of aortic valve stenosis. FINDINGS  Left Ventricle: Left ventricular ejection fraction, by estimation, is 60 to 65%. The left ventricle has normal function. The left ventricle has no regional wall motion abnormalities. The left ventricular internal cavity size was normal in size. There is  no left ventricular hypertrophy. Left ventricular diastolic parameters were normal. Right Ventricle: The right ventricular size is normal. No increase in right ventricular wall thickness. Right ventricular systolic function is normal. Left Atrium: Left atrial size was normal in size. Right Atrium: Right atrial size was normal in size. Pericardium: There is no evidence of pericardial effusion. Mitral Valve: The mitral valve is normal in structure. Trivial mitral valve regurgitation. Tricuspid Valve: The  tricuspid valve is normal in structure. Tricuspid valve regurgitation is not demonstrated. No evidence of tricuspid stenosis. Aortic Valve: The aortic valve is normal in structure. Aortic valve regurgitation is not visualized. Aortic valve sclerosis is present, with no evidence of aortic valve stenosis. Aortic valve mean gradient measures 9.0 mmHg. Aortic valve peak gradient measures 19.4 mmHg. Aortic  valve area, by VTI measures 2.14 cm. Pulmonic Valve: The pulmonic valve was normal in structure. Pulmonic valve regurgitation is not visualized. No evidence of pulmonic stenosis. Aorta: The aortic root is normal in size and structure. IAS/Shunts: The interatrial septum was not assessed.  LEFT VENTRICLE PLAX 2D LVIDd:         5.10 cm   Diastology LVIDs:         3.10 cm   LV e' medial:    8.49 cm/s LV PW:         0.90 cm   LV E/e' medial:  10.5 LV IVS:        0.60 cm   LV e' lateral:   13.80 cm/s LVOT diam:     2.00 cm   LV E/e' lateral: 6.5 LV SV:         88 LV SV Index:   37 LVOT Area:     3.14 cm  RIGHT VENTRICLE             IVC RV S prime:     20.70 cm/s  IVC diam: 1.70 cm TAPSE (M-mode): 2.3 cm LEFT ATRIUM           Index LA diam:      3.00 cm 1.26 cm/m LA Vol (A4C): 39.9 ml 16.79 ml/m  AORTIC VALVE                     PULMONIC VALVE AV Area (Vmax):    1.74 cm      PV Vmax:       0.96 m/s AV Area (Vmean):   2.18 cm      PV Peak grad:  3.7 mmHg AV Area (VTI):     2.14 cm AV Vmax:           220.00 cm/s AV Vmean:          138.000 cm/s AV VTI:            0.412 m AV Peak Grad:      19.4 mmHg AV Mean Grad:      9.0 mmHg LVOT Vmax:         122.00 cm/s LVOT Vmean:        95.600 cm/s LVOT VTI:          0.281 m LVOT/AV VTI ratio: 0.68  AORTA Ao Root diam: 2.60 cm Ao Asc diam:  2.90 cm MITRAL VALVE MV Area (PHT): 6.83 cm    SHUNTS MV Decel Time: 111 msec    Systemic VTI:  0.28 m MV E velocity: 89.10 cm/s  Systemic Diam: 2.00 cm Truett Mainland MD Electronically signed by Truett Mainland MD Signature Date/Time: 11/02/2021/3:22:00 PM    Final     Microbiology: Results for orders placed or performed during the hospital encounter of 11/01/21  Culture, blood (Routine X 2) w Reflex to ID Panel     Status: None (Preliminary result)   Collection Time: 11/02/21  9:38 AM   Specimen: BLOOD  Result Value Ref Range Status   Specimen Description BLOOD BLOOD LEFT FOREARM  Final   Special Requests   Final  BOTTLES  DRAWN AEROBIC AND ANAEROBIC Blood Culture adequate volume   Culture   Final    NO GROWTH 2 DAYS Performed at [facility], [address] [place], [place] [address]    Report Status PENDING  Incomplete  Culture, blood (Routine X 2) w Reflex to ID Panel     Status: None (Preliminary result)   Collection Time: 11/02/21  9:43 AM   Specimen: BLOOD  Result Value Ref Range Status   Specimen Description BLOOD BLOOD LEFT WRIST  Final   Special Requests   Final    BOTTLES DRAWN AEROBIC AND ANAEROBIC Blood Culture adequate volume   Culture   Final    NO GROWTH 2 DAYS Performed at [facility], [address] [place], [place] [address]    Report Status PENDING  Incomplete    Labs: CBC: Recent Labs  Lab 11/01/21 2116 11/03/21 0050 11/04/21 0026  WBC 20.2* 16.8* 18.4*  HGB 9.9* 8.6* 8.7*  HCT 31.4* 27.2* 26.5*  MCV 71.2* 70.8* 70.3*  PLT 431* 372 348   Basic Metabolic Panel: Recent Labs  Lab 11/01/21 2116 11/03/21 0050 11/04/21 0026  NA 137 135 135  K 4.5 4.1 3.9  CL 105 105 104  CO2 23 25 23   GLUCOSE 145* 177* 148*  BUN 12 9 12   CREATININE 1.00 0.95 1.18*  CALCIUM 9.2 8.2* 8.1*   Liver Function Tests: Recent Labs  Lab 11/03/21 0050  AST 7*  ALT 9  ALKPHOS 76  BILITOT 0.3  PROT 6.5  ALBUMIN 2.1*   CBG: Recent Labs  Lab 11/03/21 1115 11/03/21 1631 11/03/21 2121 11/04/21 0614 11/04/21 1153  GLUCAP 204* 205* 108* 206* 229*    Discharge time spent: approximately 40 minutes spent on discharge counseling, evaluation of patient on day of discharge, and coordination of discharge planning with nursing, social work, pharmacy and case management  Signed: Alberteen Samhristopher P Katey Barrie, MD [facility] 11/04/2021

## 2021-11-04 NOTE — Progress Notes (Signed)
  Mobility Specialist Criteria Algorithm Info.   11/04/21 1050  Oxygen Therapy  SpO2 92 %  O2 Device Room Air  Patient Activity (if Appropriate) Ambulating  Pulse Oximetry Type Continuous  Mobility  Activity Ambulated with assistance in hallway;Dangled on edge of bed  Range of Motion/Exercises Active;All extremities  Level of Assistance Standby assist, set-up cues, supervision of patient - no hands on  Assistive Device Front wheel walker  Distance Ambulated (ft) 280 ft  Activity Response Tolerated well   Patient received in bed asleep but easily aroused. Agreeable to participate in mobility. Pt is completely independent with bed mobility, needed cues for hand placement with standing. Ambulated in hallway supervision level with slow gait. Tolerated ambulating on RA without need for supplemental O2, maintaining >92% throughout. Returned to room without complaint or incident. Was left dangling EOB with all needs met, call bell in reach.   11/04/2021 11:10 AM  [place] Keniya Schlotterbeck, CMS, [place]  OYOOJ:[phone] Office: [phone]

## 2021-11-04 NOTE — Telephone Encounter (Signed)
Pt's spouse submitted medical release form, FMLA forms, and $25.00 cash payment . Accepted  11/03/21

## 2021-11-04 NOTE — Progress Notes (Signed)
SATURATION QUALIFICATIONS: (This note is used to comply with regulatory documentation for home oxygen)  Patient Saturations on Room Air at Rest =93%  Patient Saturations on Room Air while Ambulating = 92%  Patient Saturations on 0 Liters of oxygen while Ambulating = n/a  Please briefly explain why patient needs home oxygen: 

## 2021-11-04 NOTE — Progress Notes (Signed)
Inpatient Diabetes Program Recommendations  AACE/ADA: New Consensus Statement on Inpatient Glycemic Control (2015)  Target Ranges:  Prepandial:   less than 140 mg/dL      Peak postprandial:   less than 180 mg/dL (1-2 hours)      Critically ill patients:  140 - 180 mg/dL   Lab Results  Component Value Date   GLUCAP 206 (H) 11/04/2021   HGBA1C 13.1 (H) 12/02/2020    Inpatient Diabetes Program Recommendations:    Has diet ordered.  Please consider Novolog 3 units TID with meals if eats at least 50%  Will continue to follow while inpatient.  Thank you, Dulce Sellar, MSN, CDCES Diabetes Coordinator Inpatient Diabetes Program [phone] (team pager from 8a-5p)

## 2021-11-04 NOTE — Telephone Encounter (Signed)
Noted, she has an appt with [place] on Monday 11/07/21, will make him aware.

## 2021-11-04 NOTE — Plan of Care (Signed)
  Problem: Education: Goal: Knowledge of General Education information will improve Description: Including pain rating scale, medication(s)/side effects and non-pharmacologic comfort measures Outcome: Progressing   Problem: Health Behavior/Discharge Planning: Goal: Ability to manage health-related needs will improve Outcome: Progressing   Problem: Clinical Measurements: Goal: Ability to maintain clinical measurements within normal limits will improve Outcome: Progressing Goal: Respiratory complications will improve Outcome: Progressing Goal: Cardiovascular complication will be avoided Outcome: Progressing   Problem: Nutrition: Goal: Adequate nutrition will be maintained Outcome: Progressing   Problem: Coping: Goal: Level of anxiety will decrease Outcome: Progressing   Problem: Elimination: Goal: Will not experience complications related to bowel motility Outcome: Progressing Goal: Will not experience complications related to urinary retention Outcome: Progressing   Problem: Safety: Goal: Ability to remain free from injury will improve Outcome: Progressing

## 2021-11-04 NOTE — Plan of Care (Signed)

## 2021-11-07 ENCOUNTER — Ambulatory Visit (INDEPENDENT_AMBULATORY_CARE_PROVIDER_SITE_OTHER): Payer: Managed Care, Other (non HMO) | Admitting: Orthopedic Surgery

## 2021-11-07 ENCOUNTER — Encounter: Payer: Self-pay | Admitting: Orthopedic Surgery

## 2021-11-07 DIAGNOSIS — Z89431 Acquired absence of right foot: Secondary | ICD-10-CM | POA: Diagnosis not present

## 2021-11-07 DIAGNOSIS — M86271 Subacute osteomyelitis, right ankle and foot: Secondary | ICD-10-CM | POA: Diagnosis not present

## 2021-11-07 DIAGNOSIS — L02611 Cutaneous abscess of right foot: Secondary | ICD-10-CM | POA: Diagnosis not present

## 2021-11-07 LAB — CULTURE, BLOOD (ROUTINE X 2)
Culture: NO GROWTH
Culture: NO GROWTH
Special Requests: ADEQUATE
Special Requests: ADEQUATE

## 2021-11-07 NOTE — Progress Notes (Signed)
Office Visit Note   Patient: Valerie Le           Date of Birth: 12/28/1970           MRN: [id number] Visit Date: 11/07/2021              Requested by: Maud Deed, [place] [address] [place],  [place] [address] PCP: Maud Deed, [place]  Chief Complaint  Patient presents with   Right Foot - Follow-up    Hx transmet amputation       HPI: Patient is a 51 year old woman who is seen in follow-up for abscess and osteomyelitis right foot.  She is status post transmetatarsal amputation.  Patient has been on IV antibiotics and is now on doxycycline.  Patient states she has had increased drainage pain swelling and odor.  Assessment & Plan: Visit Diagnoses:  1. History of transmetatarsal amputation of right foot (HCC)   2. Subacute osteomyelitis of right foot (HCC)   3. Cutaneous abscess of right foot     Plan: With the progressive infection to the right foot have recommended proceeding with the treatments tibial amputation with biologic tissue graft.  The risk and benefits were discussed including infection neurovascular injury persistent pain need for additional surgery.  Patient states she understands wished to proceed at this time.  Patient will need at least 3 months out of work on her FMLA.  She was given a note to get this process started.  Follow-Up Instructions: Return in about 2 weeks (around 11/21/2021).   Ortho Exam  Patient is alert, oriented, no adenopathy, well-dressed, normal affect, normal respiratory effort. Examination patient has 4 new necrotic ulcers on the plantar aspect of the right transmetatarsal amputation the ulcer does probe to bone or about 10 mm in diameter.  Review of the MRI scan shows deep abscess and osteomyelitis throughout the foot.  Patient does have a palpable dorsalis pedis and posterior tibial pulse.  Imaging: No results found. No images are attached to the encounter.  Labs: Lab Results  Component Value Date    HGBA1C 13.1 (H) 12/02/2020   ESRSEDRATE 84 (H) 11/02/2021   ESRSEDRATE 72 (H) 06/15/2021   CRP 24.7 (H) 06/15/2021   REPTSTATUS 11/07/2021 FINAL 11/02/2021   GRAMSTAIN  06/29/2021    RARE WBC PRESENT, PREDOMINANTLY MONONUCLEAR NO ORGANISMS SEEN    CULT  11/02/2021    NO GROWTH 5 DAYS Performed at [facility], [address] [place], [place] [address]    [facility] STAPHYLOCOCCUS EPIDERMIDIS 06/29/2021   LABORGA STAPHYLOCOCCUS LUGDUNENSIS 06/29/2021     Lab Results  Component Value Date   ALBUMIN 2.1 (L) 11/03/2021   ALBUMIN 2.6 (L) 12/03/2020   ALBUMIN 2.9 (L) 12/02/2020    No results found for: MG No results found for: VD25OH  No results found for: PREALBUMIN    Latest Ref Rng & Units 11/04/2021   12:26 AM 11/03/2021   12:50 AM 11/01/2021    9:16 PM  CBC EXTENDED  WBC 4.0 - 10.5 K/uL 18.4   16.8   20.2    RBC 3.87 - 5.11 MIL/uL 3.77   3.84   4.41    Hemoglobin 12.0 - 15.0 g/dL 8.7   8.6   9.9    HCT 36.0 - 46.0 % 26.5   27.2   31.4    Platelets 150 - 400 K/uL 348   372   431       There is no height or  weight on file to calculate BMI.  Orders:  No orders of the defined types were placed in this encounter.  No orders of the defined types were placed in this encounter.    Procedures: No procedures performed  Clinical Data: No additional findings.  ROS:  All other systems negative, except as noted in the HPI. Review of Systems  Objective: Vital Signs: There were no vitals taken for this visit.  Specialty Comments:  No specialty comments available.  PMFS History: Patient Active Problem List   Diagnosis Date Noted   Hypoalbuminemia 11/03/2021   Non-pressure chronic ulcer of other part of right foot limited to breakdown of skin (HCC)    Chest pain 11/02/2021   Elevated troponin 11/02/2021   SIRS (systemic inflammatory response syndrome) (HCC) 11/02/2021   Diabetes mellitus type 1 (HCC) 11/02/2021   Thrombocytosis 11/02/2021   Microcytic  hypochromic anemia 11/02/2021   Hypokalemia 11/02/2021   Morbid obesity (HCC) 11/02/2021   Medication monitoring encounter 06/15/2021   Smoking 02/01/2021   Acute hematogenous osteomyelitis of right foot (HCC) 12/22/2020   Osteomyelitis of foot, right, acute (HCC)    Dehiscence of amputation stump (HCC)    Diabetic foot infection (HCC) 12/02/2020   Essential hypertension 12/02/2020   Hyperlipidemia 08/21/2018   Bipolar affective disorder, currently depressed, moderate (HCC) 01/23/2017   Insulin-treated type 2 diabetes mellitus (HCC) 03/07/2013   Asthma 03/07/2013   ADHD (attention deficit hyperactivity disorder) 03/07/2013   Migraine 03/07/2013   Tobacco abuse 03/07/2013   Past Medical History:  Diagnosis Date   Anemia    Asthma    as a child   Depression    Diabetes mellitus without complication (HCC)    Type 1 - Has Insulin Pump   Headache    migraines   High cholesterol    Hypertension     History reviewed. No pertinent family history.  Past Surgical History:  Procedure Laterality Date   AMPUTATION Right 12/22/2020   Procedure: RIGHT TRANSMETATARSAL AMPUTATION;  Surgeon: [name], MD;  Location: [facility] OR;  Service: Orthopedics;  Laterality: Right;   AMPUTATION Right 06/29/2021   Procedure: REVISION RIGHT TRANSMETATARSAL AMPUTATION;  Surgeon: [name], MD;  Location: [facility] OR;  Service: Orthopedics;  Laterality: Right;   AMPUTATION TOE Right 12/04/2020   Procedure: AMPUTATION RIGHT SECOND TOE, I&D FOOT;  Surgeon: Eldred Manges, MD;  Location: [facility] ORS;  Service: Orthopedics;  Laterality: Right;  Right second toe amputation, I&D right foot.   birth control implant  07/31/2006   Essure Implant   TONSILLECTOMY     TYMPANOSTOMY TUBE PLACEMENT     WISDOM TOOTH EXTRACTION     Social History   Occupational History   Not on file  Tobacco Use   Smoking status: Every Day    Packs/day: 0.25    Years: 25.00    Pack years: 6.25    Types: Cigarettes   Smokeless tobacco:  Never   Tobacco comments:    Cut down on smoking while on Chantix-  Vaping Use   Vaping Use: Never used  Substance and Sexual Activity   Alcohol use: Not Currently    Comment: Once a year on New Year's Day   Drug use: Not Currently   Sexual activity: Yes    Birth control/protection: Implant    Comment: Essure Implant

## 2021-11-07 NOTE — Telephone Encounter (Signed)
Pt in office this afternoon discussing treatment options with Dr. Sharol Given.

## 2021-11-08 ENCOUNTER — Ambulatory Visit: Payer: Managed Care, Other (non HMO) | Admitting: Family

## 2021-11-10 ENCOUNTER — Other Ambulatory Visit: Payer: Self-pay

## 2021-11-10 ENCOUNTER — Encounter (HOSPITAL_COMMUNITY): Payer: Self-pay | Admitting: Orthopedic Surgery

## 2021-11-10 NOTE — Progress Notes (Addendum)
Mrs Antonieta Pert- Fischer denies chest pain or shortness of breath. Patient denies having any s/s of Covid in her household.  Patient denies any known exposure to Covid.   Mrs. Antonieta Pert- Fischer PCP is Maud Deed, PA-C.  Endocrinologist is Dr. Shawnee Knapp.  Mrs Antonieta Pert- Fischer has type II diabetes. Patient has an Omi Insulin Pump, she is not sure of which Insulin is being used or what the dose is. I instructed Mrs. Fabio Asa to decrease pump by 20%.  Patient is not sure she knows how, she said she will call Dr. Pamelia Hoit.  I asked Mrs Antonieta Pert- Fischer to ask about Insulin and dosage. Patient reports that  CBG's run in th 170's, in Dr. Earmon Phoenix notes mention 170- 200, 200 after eating. Mrs. Antonieta Pert- Fischer has a Dexicom blood sugar reader.  I have paged the diabetic coordinator ~ 4 times. I call the operator to see if she has a phone number for  diabetic coordinator, I was given this phone number [phone], the person who answered said that I had the wrong number, she is at inpatient, that I need outpatient.  I asked if she had a number for outpatient coordinator, she said didn't know.   I spoke to Mrs Antonieta Pert- Fischer, she did call Dr. Shawnee Knapp, he told her that she did not need to do anything, the pump was set to decrease at night.

## 2021-11-11 ENCOUNTER — Telehealth: Payer: Self-pay | Admitting: Orthopedic Surgery

## 2021-11-11 ENCOUNTER — Inpatient Hospital Stay (HOSPITAL_COMMUNITY)
Admission: RE | Admit: 2021-11-11 | Discharge: 2021-11-16 | DRG: 240 | Disposition: A | Discharge status: Rehabilitation Facility | Payer: Managed Care, Other (non HMO) | Attending: Orthopedic Surgery | Admitting: Orthopedic Surgery

## 2021-11-11 ENCOUNTER — Inpatient Hospital Stay (HOSPITAL_COMMUNITY): Payer: Managed Care, Other (non HMO) | Admitting: Certified Registered Nurse Anesthetist

## 2021-11-11 ENCOUNTER — Encounter (HOSPITAL_COMMUNITY): Payer: Self-pay | Admitting: Orthopedic Surgery

## 2021-11-11 ENCOUNTER — Encounter (HOSPITAL_COMMUNITY)
Admission: RE | Disposition: A | Discharge status: Rehabilitation Facility | Payer: Self-pay | Source: Home / Self Care | Attending: Orthopedic Surgery

## 2021-11-11 ENCOUNTER — Other Ambulatory Visit: Payer: Self-pay

## 2021-11-11 DIAGNOSIS — M869 Osteomyelitis, unspecified: Secondary | ICD-10-CM | POA: Diagnosis not present

## 2021-11-11 DIAGNOSIS — E119 Type 2 diabetes mellitus without complications: Secondary | ICD-10-CM

## 2021-11-11 DIAGNOSIS — E10621 Type 1 diabetes mellitus with foot ulcer: Secondary | ICD-10-CM | POA: Diagnosis present

## 2021-11-11 DIAGNOSIS — E1065 Type 1 diabetes mellitus with hyperglycemia: Secondary | ICD-10-CM | POA: Diagnosis not present

## 2021-11-11 DIAGNOSIS — Z89431 Acquired absence of right foot: Secondary | ICD-10-CM | POA: Diagnosis not present

## 2021-11-11 DIAGNOSIS — D6489 Other specified anemias: Secondary | ICD-10-CM | POA: Diagnosis present

## 2021-11-11 DIAGNOSIS — Z9641 Presence of insulin pump (external) (internal): Secondary | ICD-10-CM | POA: Diagnosis present

## 2021-11-11 DIAGNOSIS — E7800 Pure hypercholesterolemia, unspecified: Secondary | ICD-10-CM | POA: Diagnosis present

## 2021-11-11 DIAGNOSIS — E1069 Type 1 diabetes mellitus with other specified complication: Secondary | ICD-10-CM | POA: Diagnosis present

## 2021-11-11 DIAGNOSIS — D72829 Elevated white blood cell count, unspecified: Secondary | ICD-10-CM | POA: Diagnosis not present

## 2021-11-11 DIAGNOSIS — I96 Gangrene, not elsewhere classified: Secondary | ICD-10-CM | POA: Diagnosis present

## 2021-11-11 DIAGNOSIS — Z794 Long term (current) use of insulin: Secondary | ICD-10-CM

## 2021-11-11 DIAGNOSIS — D509 Iron deficiency anemia, unspecified: Secondary | ICD-10-CM | POA: Diagnosis not present

## 2021-11-11 DIAGNOSIS — L02611 Cutaneous abscess of right foot: Secondary | ICD-10-CM | POA: Diagnosis present

## 2021-11-11 DIAGNOSIS — M86271 Subacute osteomyelitis, right ankle and foot: Secondary | ICD-10-CM | POA: Diagnosis present

## 2021-11-11 DIAGNOSIS — L97519 Non-pressure chronic ulcer of other part of right foot with unspecified severity: Secondary | ICD-10-CM | POA: Diagnosis present

## 2021-11-11 DIAGNOSIS — L97511 Non-pressure chronic ulcer of other part of right foot limited to breakdown of skin: Principal | ICD-10-CM

## 2021-11-11 DIAGNOSIS — S88111A Complete traumatic amputation at level between knee and ankle, right lower leg, initial encounter: Secondary | ICD-10-CM

## 2021-11-11 DIAGNOSIS — D62 Acute posthemorrhagic anemia: Secondary | ICD-10-CM | POA: Diagnosis not present

## 2021-11-11 DIAGNOSIS — F17200 Nicotine dependence, unspecified, uncomplicated: Secondary | ICD-10-CM

## 2021-11-11 DIAGNOSIS — D500 Iron deficiency anemia secondary to blood loss (chronic): Secondary | ICD-10-CM | POA: Diagnosis not present

## 2021-11-11 DIAGNOSIS — I10 Essential (primary) hypertension: Secondary | ICD-10-CM | POA: Diagnosis present

## 2021-11-11 DIAGNOSIS — E1052 Type 1 diabetes mellitus with diabetic peripheral angiopathy with gangrene: Secondary | ICD-10-CM | POA: Diagnosis present

## 2021-11-11 DIAGNOSIS — F32A Depression, unspecified: Secondary | ICD-10-CM | POA: Diagnosis present

## 2021-11-11 DIAGNOSIS — F17210 Nicotine dependence, cigarettes, uncomplicated: Secondary | ICD-10-CM | POA: Diagnosis present

## 2021-11-11 DIAGNOSIS — IMO0001 Reserved for inherently not codable concepts without codable children: Secondary | ICD-10-CM

## 2021-11-11 DIAGNOSIS — R7401 Elevation of levels of liver transaminase levels: Secondary | ICD-10-CM | POA: Diagnosis not present

## 2021-11-11 DIAGNOSIS — M86071 Acute hematogenous osteomyelitis, right ankle and foot: Secondary | ICD-10-CM

## 2021-11-11 DIAGNOSIS — M86171 Other acute osteomyelitis, right ankle and foot: Secondary | ICD-10-CM

## 2021-11-11 DIAGNOSIS — Z89511 Acquired absence of right leg below knee: Secondary | ICD-10-CM | POA: Diagnosis not present

## 2021-11-11 DIAGNOSIS — M25561 Pain in right knee: Secondary | ICD-10-CM | POA: Diagnosis not present

## 2021-11-11 DIAGNOSIS — R5381 Other malaise: Secondary | ICD-10-CM | POA: Diagnosis not present

## 2021-11-11 HISTORY — PX: AMPUTATION: SHX166

## 2021-11-11 HISTORY — PX: APPLICATION OF WOUND VAC: SHX5189

## 2021-11-11 LAB — CBC WITH DIFFERENTIAL/PLATELET
Abs Immature Granulocytes: 0.25 10*3/uL — ABNORMAL HIGH (ref 0.00–0.07)
Basophils Absolute: 0.1 10*3/uL (ref 0.0–0.1)
Basophils Relative: 1 %
Eosinophils Absolute: 0.8 10*3/uL — ABNORMAL HIGH (ref 0.0–0.5)
Eosinophils Relative: 4 %
HCT: 29.8 % — ABNORMAL LOW (ref 36.0–46.0)
Hemoglobin: 9.5 g/dL — ABNORMAL LOW (ref 12.0–15.0)
Immature Granulocytes: 1 %
Lymphocytes Relative: 20 %
Lymphs Abs: 3.8 10*3/uL (ref 0.7–4.0)
MCH: 22.8 pg — ABNORMAL LOW (ref 26.0–34.0)
MCHC: 31.9 g/dL (ref 30.0–36.0)
MCV: 71.5 fL — ABNORMAL LOW (ref 80.0–100.0)
Monocytes Absolute: 0.9 10*3/uL (ref 0.1–1.0)
Monocytes Relative: 5 %
Neutro Abs: 12.9 10*3/uL — ABNORMAL HIGH (ref 1.7–7.7)
Neutrophils Relative %: 69 %
Platelets: 512 10*3/uL — ABNORMAL HIGH (ref 150–400)
RBC: 4.17 MIL/uL (ref 3.87–5.11)
RDW: 18.2 % — ABNORMAL HIGH (ref 11.5–15.5)
WBC: 18.7 10*3/uL — ABNORMAL HIGH (ref 4.0–10.5)
nRBC: 0 % (ref 0.0–0.2)

## 2021-11-11 LAB — GLUCOSE, CAPILLARY
Glucose-Capillary: 163 mg/dL — ABNORMAL HIGH (ref 70–99)
Glucose-Capillary: 156 mg/dL — ABNORMAL HIGH (ref 70–99)
Glucose-Capillary: 185 mg/dL — ABNORMAL HIGH (ref 70–99)
Glucose-Capillary: 224 mg/dL — ABNORMAL HIGH (ref 70–99)
Glucose-Capillary: 229 mg/dL — ABNORMAL HIGH (ref 70–99)

## 2021-11-11 LAB — VITAMIN D 25 HYDROXY (VIT D DEFICIENCY, FRACTURES): Vit D, 25-Hydroxy: 32.37 ng/mL (ref 30–100)

## 2021-11-11 LAB — PREALBUMIN: Prealbumin: 11.4 mg/dL — ABNORMAL LOW (ref 18–38)

## 2021-11-11 LAB — MAGNESIUM: Magnesium: 1.9 mg/dL (ref 1.7–2.4)

## 2021-11-11 SURGERY — AMPUTATION BELOW KNEE
Anesthesia: General | Site: Knee | Laterality: Right

## 2021-11-11 MED ORDER — POTASSIUM CHLORIDE CRYS ER 20 MEQ PO TBCR: 20.0000 meq | EXTENDED_RELEASE_TABLET | Freq: Every day | ORAL | Status: DC | PRN

## 2021-11-11 MED ORDER — HYDROMORPHONE HCL 1 MG/ML IJ SOLN
0.2500 mg | INTRAMUSCULAR | Status: DC | PRN
  Administered 2021-11-11 (×4): 0.5 mg via INTRAVENOUS

## 2021-11-11 MED ORDER — HYDROMORPHONE HCL 1 MG/ML IJ SOLN
INTRAMUSCULAR | Status: AC
  Filled 2021-11-11: qty 0.5

## 2021-11-11 MED ORDER — AMLODIPINE BESYLATE 10 MG PO TABS
10.0000 mg | ORAL_TABLET | Freq: Every day | ORAL | Status: DC
  Administered 2021-11-11 – 2021-11-15 (×5): 10 mg via ORAL
  Filled 2021-11-11 (×5): qty 1

## 2021-11-11 MED ORDER — TRANEXAMIC ACID-NACL 1000-0.7 MG/100ML-% IV SOLN
1000.0000 mg | INTRAVENOUS | Status: AC
  Administered 2021-11-11: 1000 mg via INTRAVENOUS
  Filled 2021-11-11: qty 100

## 2021-11-11 MED ORDER — ATORVASTATIN CALCIUM 80 MG PO TABS
80.0000 mg | ORAL_TABLET | Freq: Every day | ORAL | Status: DC
  Administered 2021-11-11 – 2021-11-15 (×5): 80 mg via ORAL
  Filled 2021-11-11 (×5): qty 1

## 2021-11-11 MED ORDER — GLYCOPYRROLATE PF 0.2 MG/ML IJ SOSY
PREFILLED_SYRINGE | INTRAMUSCULAR | Status: AC
  Filled 2021-11-11: qty 1

## 2021-11-11 MED ORDER — ONDANSETRON HCL 4 MG/2ML IJ SOLN
INTRAMUSCULAR | Status: DC | PRN
  Administered 2021-11-11: 4 mg via INTRAVENOUS

## 2021-11-11 MED ORDER — HYDROMORPHONE HCL 1 MG/ML IJ SOLN
0.5000 mg | INTRAMUSCULAR | Status: DC | PRN
  Administered 2021-11-11 – 2021-11-15 (×4): 1 mg via INTRAVENOUS
  Filled 2021-11-11 (×4): qty 1

## 2021-11-11 MED ORDER — CHLORHEXIDINE GLUCONATE 0.12 % MT SOLN: 15.0000 mL | Freq: Once | OROMUCOSAL | Status: AC

## 2021-11-11 MED ORDER — OXYCODONE HCL 5 MG PO TABS
5.0000 mg | ORAL_TABLET | Freq: Once | ORAL | Status: AC | PRN
  Administered 2021-11-11: 5 mg via ORAL

## 2021-11-11 MED ORDER — GUAIFENESIN-DM 100-10 MG/5ML PO SYRP: 15.0000 mL | ORAL_SOLUTION | ORAL | Status: DC | PRN

## 2021-11-11 MED ORDER — ONDANSETRON HCL 4 MG/2ML IJ SOLN
4.0000 mg | Freq: Four times a day (QID) | INTRAMUSCULAR | Status: DC | PRN
  Administered 2021-11-11 – 2021-11-15 (×3): 4 mg via INTRAVENOUS
  Filled 2021-11-11 (×3): qty 2

## 2021-11-11 MED ORDER — VARENICLINE TARTRATE 1 MG PO TABS
1.0000 mg | ORAL_TABLET | Freq: Every day | ORAL | Status: DC
  Administered 2021-11-11 – 2021-11-15 (×5): 1 mg via ORAL
  Filled 2021-11-11 (×8): qty 1

## 2021-11-11 MED ORDER — MAGNESIUM CITRATE PO SOLN: 1.0000 | Freq: Once | ORAL | Status: DC | PRN

## 2021-11-11 MED ORDER — ORAL CARE MOUTH RINSE: 15.0000 mL | Freq: Once | OROMUCOSAL | Status: AC

## 2021-11-11 MED ORDER — METOPROLOL TARTRATE 5 MG/5ML IV SOLN: 2.0000 mg | INTRAVENOUS | Status: DC | PRN

## 2021-11-11 MED ORDER — BISACODYL 5 MG PO TBEC
5.0000 mg | DELAYED_RELEASE_TABLET | Freq: Every day | ORAL | Status: DC | PRN
  Administered 2021-11-11 – 2021-11-15 (×2): 5 mg via ORAL
  Filled 2021-11-11 (×2): qty 1

## 2021-11-11 MED ORDER — LACTATED RINGERS IV SOLN: INTRAVENOUS | Status: DC

## 2021-11-11 MED ORDER — CEFAZOLIN IN SODIUM CHLORIDE 3-0.9 GM/100ML-% IV SOLN
3.0000 g | INTRAVENOUS | Status: AC
  Administered 2021-11-11: 3 g via INTRAVENOUS
  Filled 2021-11-11: qty 100

## 2021-11-11 MED ORDER — GLYCOPYRROLATE 0.2 MG/ML IJ SOLN
INTRAMUSCULAR | Status: DC | PRN
  Administered 2021-11-11: .2 mg via INTRAVENOUS

## 2021-11-11 MED ORDER — OXYCODONE HCL 5 MG/5ML PO SOLN: 5.0000 mg | Freq: Once | ORAL | Status: AC | PRN

## 2021-11-11 MED ORDER — CHLORHEXIDINE GLUCONATE 0.12 % MT SOLN
OROMUCOSAL | Status: AC
  Administered 2021-11-11: 15 mL via OROMUCOSAL
  Filled 2021-11-11: qty 15

## 2021-11-11 MED ORDER — HYDROMORPHONE HCL 2 MG PO TABS
2.0000 mg | ORAL_TABLET | ORAL | Status: DC | PRN
  Administered 2021-11-11 – 2021-11-12 (×2): 2 mg via ORAL
  Filled 2021-11-11 (×3): qty 1

## 2021-11-11 MED ORDER — FERROUS SULFATE 325 (65 FE) MG PO TABS
325.0000 mg | ORAL_TABLET | Freq: Two times a day (BID) | ORAL | Status: DC
  Administered 2021-11-11 – 2021-11-16 (×10): 325 mg via ORAL
  Filled 2021-11-11 (×10): qty 1

## 2021-11-11 MED ORDER — HYDROMORPHONE HCL 1 MG/ML IJ SOLN
INTRAMUSCULAR | Status: AC
  Filled 2021-11-11: qty 2

## 2021-11-11 MED ORDER — ACETAMINOPHEN 10 MG/ML IV SOLN
INTRAVENOUS | Status: AC
  Filled 2021-11-11: qty 100

## 2021-11-11 MED ORDER — MIDAZOLAM HCL 2 MG/2ML IJ SOLN
INTRAMUSCULAR | Status: AC
  Filled 2021-11-11: qty 2

## 2021-11-11 MED ORDER — DULAGLUTIDE 0.75 MG/0.5ML ~~LOC~~ SOAJ: 0.7500 mg | SUBCUTANEOUS | Status: DC

## 2021-11-11 MED ORDER — LABETALOL HCL 5 MG/ML IV SOLN: 10.0000 mg | INTRAVENOUS | Status: DC | PRN

## 2021-11-11 MED ORDER — JUVEN PO PACK
1.0000 | PACK | Freq: Two times a day (BID) | ORAL | Status: DC
  Administered 2021-11-11 – 2021-11-16 (×11): 1 via ORAL
  Filled 2021-11-11 (×11): qty 1

## 2021-11-11 MED ORDER — GABAPENTIN 300 MG PO CAPS
300.0000 mg | ORAL_CAPSULE | Freq: Three times a day (TID) | ORAL | Status: DC
  Administered 2021-11-11 – 2021-11-16 (×16): 300 mg via ORAL
  Filled 2021-11-11 (×17): qty 1

## 2021-11-11 MED ORDER — POLYETHYLENE GLYCOL 3350 17 G PO PACK
17.0000 g | PACK | Freq: Every day | ORAL | Status: DC | PRN
  Administered 2021-11-11 – 2021-11-15 (×3): 17 g via ORAL
  Filled 2021-11-11 (×3): qty 1

## 2021-11-11 MED ORDER — PROPOFOL 10 MG/ML IV BOLUS
INTRAVENOUS | Status: DC | PRN
  Administered 2021-11-11: 150 mg via INTRAVENOUS

## 2021-11-11 MED ORDER — DEXAMETHASONE SODIUM PHOSPHATE 10 MG/ML IJ SOLN
INTRAMUSCULAR | Status: DC | PRN
  Administered 2021-11-11: 10 mg via INTRAVENOUS

## 2021-11-11 MED ORDER — ASCORBIC ACID 500 MG PO TABS
1000.0000 mg | ORAL_TABLET | Freq: Every day | ORAL | Status: DC
  Administered 2021-11-11 – 2021-11-16 (×6): 1000 mg via ORAL
  Filled 2021-11-11 (×6): qty 2

## 2021-11-11 MED ORDER — CEFAZOLIN SODIUM-DEXTROSE 2-4 GM/100ML-% IV SOLN
2.0000 g | Freq: Three times a day (TID) | INTRAVENOUS | Status: AC
  Administered 2021-11-11 – 2021-11-12 (×2): 2 g via INTRAVENOUS
  Filled 2021-11-11 (×2): qty 100

## 2021-11-11 MED ORDER — INSULIN PUMP
Freq: Three times a day (TID) | SUBCUTANEOUS | Status: DC
  Administered 2021-11-14: 0.4 via SUBCUTANEOUS
  Administered 2021-11-14: 1.3 via SUBCUTANEOUS
  Administered 2021-11-14: 13.05 via SUBCUTANEOUS
  Filled 2021-11-11 (×3): qty 1

## 2021-11-11 MED ORDER — PROPOFOL 10 MG/ML IV BOLUS
INTRAVENOUS | Status: AC
  Filled 2021-11-11: qty 20

## 2021-11-11 MED ORDER — MIDAZOLAM HCL 2 MG/2ML IJ SOLN
INTRAMUSCULAR | Status: DC | PRN
Start: 2021-11-11 — End: 2021-11-11
  Administered 2021-11-11: 2 mg via INTRAVENOUS

## 2021-11-11 MED ORDER — LIDOCAINE 2% (20 MG/ML) 5 ML SYRINGE
INTRAMUSCULAR | Status: DC | PRN
  Administered 2021-11-11: 60 mg via INTRAVENOUS

## 2021-11-11 MED ORDER — PANTOPRAZOLE SODIUM 40 MG PO TBEC
40.0000 mg | DELAYED_RELEASE_TABLET | Freq: Every day | ORAL | Status: DC
  Administered 2021-11-11 – 2021-11-16 (×6): 40 mg via ORAL
  Filled 2021-11-11 (×6): qty 1

## 2021-11-11 MED ORDER — KETAMINE HCL 10 MG/ML IJ SOLN
INTRAMUSCULAR | Status: DC | PRN
  Administered 2021-11-11: 10 mg via INTRAVENOUS
  Administered 2021-11-11: 30 mg via INTRAVENOUS
  Administered 2021-11-11: 10 mg via INTRAVENOUS

## 2021-11-11 MED ORDER — HYDROMORPHONE HCL 1 MG/ML IJ SOLN
INTRAMUSCULAR | Status: DC | PRN
  Administered 2021-11-11 (×5): .1 mg via INTRAVENOUS

## 2021-11-11 MED ORDER — ACETAMINOPHEN 10 MG/ML IV SOLN
INTRAVENOUS | Status: DC | PRN
  Administered 2021-11-11: 1000 mg via INTRAVENOUS

## 2021-11-11 MED ORDER — OXYCODONE HCL 5 MG PO TABS
ORAL_TABLET | ORAL | Status: AC
  Filled 2021-11-11: qty 1

## 2021-11-11 MED ORDER — FENTANYL CITRATE (PF) 100 MCG/2ML IJ SOLN
INTRAMUSCULAR | Status: AC
  Filled 2021-11-11: qty 2

## 2021-11-11 MED ORDER — DOCUSATE SODIUM 100 MG PO CAPS
100.0000 mg | ORAL_CAPSULE | Freq: Every day | ORAL | Status: DC
  Administered 2021-11-12 – 2021-11-16 (×5): 100 mg via ORAL
  Filled 2021-11-11 (×5): qty 1

## 2021-11-11 MED ORDER — SODIUM CHLORIDE 0.9 % IV SOLN: INTRAVENOUS | Status: DC

## 2021-11-11 MED ORDER — OXYCODONE HCL 5 MG PO TABS
5.0000 mg | ORAL_TABLET | ORAL | Status: DC | PRN
  Administered 2021-11-11 – 2021-11-16 (×8): 10 mg via ORAL
  Filled 2021-11-11 (×8): qty 2

## 2021-11-11 MED ORDER — INSULIN ASPART 100 UNIT/ML IJ SOLN: 6.0000 [IU] | Freq: Three times a day (TID) | INTRAMUSCULAR | Status: DC

## 2021-11-11 MED ORDER — ASPIRIN 81 MG PO TBEC
81.0000 mg | DELAYED_RELEASE_TABLET | Freq: Every day | ORAL | Status: DC
  Administered 2021-11-12 – 2021-11-16 (×5): 81 mg via ORAL
  Filled 2021-11-11 (×5): qty 1

## 2021-11-11 MED ORDER — PHENOL 1.4 % MT LIQD: 1.0000 | OROMUCOSAL | Status: DC | PRN

## 2021-11-11 MED ORDER — ALUM & MAG HYDROXIDE-SIMETH 200-200-20 MG/5ML PO SUSP: 15.0000 mL | ORAL | Status: DC | PRN

## 2021-11-11 MED ORDER — DEXAMETHASONE SODIUM PHOSPHATE 10 MG/ML IJ SOLN
INTRAMUSCULAR | Status: AC
  Filled 2021-11-11: qty 1

## 2021-11-11 MED ORDER — HYDRALAZINE HCL 20 MG/ML IJ SOLN: 5.0000 mg | INTRAMUSCULAR | Status: DC | PRN

## 2021-11-11 MED ORDER — AMITRIPTYLINE HCL 50 MG PO TABS
25.0000 mg | ORAL_TABLET | Freq: Every day | ORAL | Status: DC
  Administered 2021-11-11 – 2021-11-15 (×5): 25 mg via ORAL
  Filled 2021-11-11 (×5): qty 1

## 2021-11-11 MED ORDER — LIDOCAINE 2% (20 MG/ML) 5 ML SYRINGE
INTRAMUSCULAR | Status: AC
  Filled 2021-11-11: qty 5

## 2021-11-11 MED ORDER — INSULIN ASPART 100 UNIT/ML IJ SOLN: 0.0000 [IU] | Freq: Three times a day (TID) | INTRAMUSCULAR | Status: DC

## 2021-11-11 MED ORDER — KETAMINE HCL 50 MG/5ML IJ SOSY
PREFILLED_SYRINGE | INTRAMUSCULAR | Status: AC
  Filled 2021-11-11: qty 5

## 2021-11-11 MED ORDER — ACETAMINOPHEN 325 MG PO TABS: 325.0000 mg | ORAL_TABLET | Freq: Four times a day (QID) | ORAL | Status: DC | PRN

## 2021-11-11 MED ORDER — ONDANSETRON HCL 4 MG/2ML IJ SOLN: 4.0000 mg | Freq: Once | INTRAMUSCULAR | Status: DC | PRN

## 2021-11-11 MED ORDER — LISINOPRIL 20 MG PO TABS
40.0000 mg | ORAL_TABLET | Freq: Every day | ORAL | Status: DC
  Administered 2021-11-11 – 2021-11-15 (×5): 40 mg via ORAL
  Filled 2021-11-11 (×5): qty 2

## 2021-11-11 MED ORDER — ONDANSETRON HCL 4 MG/2ML IJ SOLN
INTRAMUSCULAR | Status: AC
  Filled 2021-11-11: qty 2

## 2021-11-11 MED ORDER — 0.9 % SODIUM CHLORIDE (POUR BTL) OPTIME
TOPICAL | Status: DC | PRN
  Administered 2021-11-11: 1000 mL

## 2021-11-11 MED ORDER — ZINC SULFATE 220 (50 ZN) MG PO CAPS
220.0000 mg | ORAL_CAPSULE | Freq: Every day | ORAL | Status: DC
  Administered 2021-11-11 – 2021-11-16 (×6): 220 mg via ORAL
  Filled 2021-11-11 (×6): qty 1

## 2021-11-11 MED ORDER — MAGNESIUM SULFATE 2 GM/50ML IV SOLN: 2.0000 g | Freq: Every day | INTRAVENOUS | Status: DC | PRN

## 2021-11-11 SURGICAL SUPPLY — 40 items
BAG COUNTER SPONGE SURGICOUNT (BAG) IMPLANT
BAG SPNG CNTER NS LX DISP (BAG)
BLADE SAW RECIP 87.9 MT (BLADE) ×3 IMPLANT
BLADE SURG 21 STRL SS (BLADE) ×3 IMPLANT
BNDG COHESIVE 6X5 TAN STRL LF (GAUZE/BANDAGES/DRESSINGS) ×1 IMPLANT
CANISTER WOUND CARE 500ML ATS (WOUND CARE) ×3 IMPLANT
COVER SURGICAL LIGHT HANDLE (MISCELLANEOUS) ×3 IMPLANT
CUFF TOURN SGL QUICK 34 (TOURNIQUET CUFF) ×3
CUFF TRNQT CYL 34X4.125X (TOURNIQUET CUFF) ×2 IMPLANT
DRAPE DERMATAC (DRAPES) ×2 IMPLANT
DRAPE INCISE IOBAN 66X45 STRL (DRAPES) ×3 IMPLANT
DRAPE U-SHAPE 47X51 STRL (DRAPES) ×3 IMPLANT
DRESSING PREVENA PLUS CUSTOM (GAUZE/BANDAGES/DRESSINGS) ×2 IMPLANT
DRSG PREVENA PLUS CUSTOM (GAUZE/BANDAGES/DRESSINGS) ×3
DURAPREP 26ML APPLICATOR (WOUND CARE) ×3 IMPLANT
ELECT REM PT RETURN 9FT ADLT (ELECTROSURGICAL) ×3
ELECTRODE REM PT RTRN 9FT ADLT (ELECTROSURGICAL) ×2 IMPLANT
GLOVE BIOGEL PI IND STRL 9 (GLOVE) ×2 IMPLANT
GLOVE BIOGEL PI INDICATOR 9 (GLOVE) ×1
GLOVE SURG ORTHO 9.0 STRL STRW (GLOVE) ×3 IMPLANT
GOWN STRL REUS W/ TWL XL LVL3 (GOWN DISPOSABLE) ×4 IMPLANT
GOWN STRL REUS W/TWL XL LVL3 (GOWN DISPOSABLE) ×6
GRAFT SKIN WND SURGIBIND 7X20 (Tissue) ×1 IMPLANT
KIT BASIN OR (CUSTOM PROCEDURE TRAY) ×3 IMPLANT
KIT TURNOVER KIT B (KITS) ×3 IMPLANT
MANIFOLD NEPTUNE II (INSTRUMENTS) ×3 IMPLANT
NS IRRIG 1000ML POUR BTL (IV SOLUTION) ×3 IMPLANT
PACK ORTHO EXTREMITY (CUSTOM PROCEDURE TRAY) ×3 IMPLANT
PAD ARMBOARD 7.5X6 YLW CONV (MISCELLANEOUS) ×3 IMPLANT
PREVENA RESTOR ARTHOFORM 46X30 (CANNISTER) ×3 IMPLANT
SPONGE T-LAP 18X18 ~~LOC~~+RFID (SPONGE) ×1 IMPLANT
STAPLER VISISTAT 35W (STAPLE) ×1 IMPLANT
STOCKINETTE IMPERVIOUS LG (DRAPES) ×3 IMPLANT
SUT ETHILON 2 0 PSLX (SUTURE) ×2 IMPLANT
SUT SILK 2 0 (SUTURE) ×3
SUT SILK 2-0 18XBRD TIE 12 (SUTURE) ×2 IMPLANT
SUT VIC AB 1 CTX 27 (SUTURE) ×6 IMPLANT
TOWEL GREEN STERILE (TOWEL DISPOSABLE) ×3 IMPLANT
TUBE CONNECTING 12X1/4 (SUCTIONS) ×3 IMPLANT
YANKAUER SUCT BULB TIP NO VENT (SUCTIONS) ×3 IMPLANT

## 2021-11-11 NOTE — Op Note (Signed)
11/11/2021  10:05 AM  PATIENT:  Valerie Le    PRE-OPERATIVE DIAGNOSIS:  Osteomyelitis Abscess Right Foot  POST-OPERATIVE DIAGNOSIS:  Same  PROCEDURE:  RIGHT BELOW KNEE AMPUTATION, APPLICATION OF WOUND VAC Application of Kerecis tissue graft 7 x 20 cm.  SURGEON:  [name], MD  ANESTHESIA:   General  PREOPERATIVE INDICATIONS:  Valerie Le is a  51 y.o. female with a diagnosis of Osteomyelitis Abscess Right Foot who failed conservative measures and elected for surgical management.    The risks benefits and alternatives were discussed with the patient preoperatively including but not limited to the risks of infection, bleeding, nerve injury, cardiopulmonary complications, the need for revision surgery, among others, and the patient was willing to proceed.  OPERATIVE IMPLANTS: Kerecis tissue graft 7 x 20 cm.   OPERATIVE FINDINGS: Tissue margins were clear muscle had good color contractility and consistency.  OPERATIVE PROCEDURE: Patient was brought to the operating room after undergoing a regional anesthetic.  After adequate levels anesthesia were obtained a thigh tourniquet was placed and the lower extremity was prepped using DuraPrep draped into a sterile field. The foot was draped out of the sterile field with impervious stockinette.  A timeout was called and the tourniquet inflated.  A transverse skin incision was made 12 cm distal to the tibial tubercle, the incision curved proximally, and a large posterior flap was created.  The tibia was transected just proximal to the skin incision and beveled anteriorly.  The fibula was transected just proximal to the tibial incision.  The sciatic nerve was pulled cut and allowed to retract.  The vascular bundles were suture ligated with 2-0 silk.  The tourniquet was deflated and hemostasis obtained.    The bone from the resected tibia was morselized with a acetabular reamer.  This was placed within the 7 x 20 cm Kerecis  tissue sheath this was folded and secured with staples across the distal tibia and fibula.    The deep and superficial fascial layers were closed using #1 Vicryl.  The skin was closed using staples.  The Prevena customizable dressing was applied this was overwrapped with the arthroform sponge.  Valerie Le was used to secure the sponges and the circumferential compression was secured to the skin with Dermatac.  This was connected to the wound VAC pump and had a good suction fit this was covered with a stump shrinker and a limb protector.  Patient was taken to the PACU in stable condition.   DISCHARGE PLANNING:  Antibiotic duration: 24-hour antibiotics  Weightbearing: Nonweightbearing on the operative extremity  Pain medication: Opioid pathway  Dressing care/ Wound VAC: Continue wound VAC with the Prevena plus pump at discharge for 1 week  Ambulatory devices: Walker or kneeling scooter  Discharge to: Discharge planning based on recommendations per physical therapy  Follow-up: In the office 1 week after discharge.

## 2021-11-11 NOTE — Transfer of Care (Signed)
Immediate Anesthesia Transfer of Care Note  Patient: Valerie Le  Procedure(s) Performed: RIGHT BELOW KNEE AMPUTATION (Right: Knee) APPLICATION OF WOUND VAC  Patient Location: PACU  Anesthesia Type:General  Level of Consciousness: awake, alert  and oriented  Airway & Oxygen Therapy: Patient Spontanous Breathing and Patient connected to nasal cannula oxygen  Post-op Assessment: Report given to RN, Post -op Vital signs reviewed and stable, Patient moving all extremities X 4 and Patient able to stick tongue midline  Post vital signs: Reviewed  Last Vitals:  Vitals Value Taken Time  BP 119/62 11/11/21 1000  Temp 36.4 C 11/11/21 1000  Pulse 74 11/11/21 1001  Resp 21 11/11/21 1001  SpO2 100 % 11/11/21 1001  Vitals shown include unvalidated device data.  Last Pain:  Vitals:   11/11/21 0717  TempSrc:   PainSc: 4       Patients Stated Pain Goal: 0 (11/11/21 0717)  Complications: No notable events documented.

## 2021-11-11 NOTE — Telephone Encounter (Signed)
Pt's husband submitted medical release, short term disability forms, and $25.00 cash payment to [employer]. Accepted 11/11/21

## 2021-11-11 NOTE — Anesthesia Procedure Notes (Signed)
Procedure Name: LMA Insertion Date/Time: 11/11/2021 9:18 AM Performed by: Cy Blamer, CRNA Pre-anesthesia Checklist: Patient identified, Emergency Drugs available, Suction available, Patient being monitored and Timeout performed Patient Re-evaluated:Patient Re-evaluated prior to induction Oxygen Delivery Method: Circle system utilized Preoxygenation: Pre-oxygenation with 100% oxygen Induction Type: IV induction Ventilation: Mask ventilation without difficulty LMA: LMA inserted LMA Size: 4.0 Number of attempts: 1 Placement Confirmation: positive ETCO2, CO2 detector and breath sounds checked- equal and bilateral Tube secured with: Tape

## 2021-11-11 NOTE — Anesthesia Preprocedure Evaluation (Addendum)
Anesthesia Evaluation  Patient identified by MRN, date of birth, ID band Patient awake    Reviewed: Allergy & Precautions, NPO status , Patient's Chart, lab work & pertinent test results, reviewed documented beta blocker date and time   Airway Mallampati: II  TM Distance: >3 FB Neck ROM: Full    Dental  (+) Chipped, Dental Advisory Given, Teeth Intact,    Pulmonary asthma , Current Smoker and Patient abstained from smoking.,    Pulmonary exam normal breath sounds clear to auscultation       Cardiovascular hypertension, Pt. on medications Normal cardiovascular exam Rhythm:Regular Rate:Normal     Neuro/Psych  Headaches, PSYCHIATRIC DISORDERS Depression Bipolar Disorder    GI/Hepatic negative GI ROS, Neg liver ROS,   Endo/Other  diabetes, Poorly Controlled, Type 1, Insulin DependentObesity Hyperlipidemia  Renal/GU Renal InsufficiencyRenal disease  negative genitourinary   Musculoskeletal negative musculoskeletal ROS (+) Osteomyelitis right foot   Abdominal (+) + obese,   Peds  Hematology  (+) Blood dyscrasia (Hgb 10.4), anemia ,   Anesthesia Other Findings   Reproductive/Obstetrics                            Anesthesia Physical  Anesthesia Plan  ASA: 3  Anesthesia Plan: General   Post-op Pain Management: Tylenol PO (pre-op)   Induction: Intravenous  PONV Risk Score and Plan: 3 and Ondansetron, Midazolam and Treatment may vary due to age or medical condition  Airway Management Planned: LMA  Additional Equipment: None  Intra-op Plan:   Post-operative Plan: Extubation in OR  Informed Consent: I have reviewed the patients History and Physical, chart, labs and discussed the procedure including the risks, benefits and alternatives for the proposed anesthesia with the patient or authorized representative who has indicated his/her understanding and acceptance.     Dental advisory  given  Plan Discussed with: CRNA and Anesthesiologist  Anesthesia Plan Comments:         Anesthesia Quick Evaluation

## 2021-11-11 NOTE — Anesthesia Postprocedure Evaluation (Signed)
Anesthesia Post Note  Patient: Valerie Le  Procedure(s) Performed: RIGHT BELOW KNEE AMPUTATION (Right: Knee) APPLICATION OF WOUND VAC     Patient location during evaluation: PACU Anesthesia Type: General Level of consciousness: awake and alert and oriented Pain management: pain level controlled Vital Signs Assessment: post-procedure vital signs reviewed and stable Respiratory status: spontaneous breathing, nonlabored ventilation and respiratory function stable Cardiovascular status: blood pressure returned to baseline and stable Postop Assessment: no apparent nausea or vomiting Anesthetic complications: no   No notable events documented.  Last Vitals:  Vitals:   11/11/21 1000 11/11/21 1015  BP: 119/62 123/71  Pulse: 74 74  Resp: 16 (!) 21  Temp: (!) 36.4 C   SpO2: 99% 98%    Last Pain:  Vitals:   11/11/21 1015  TempSrc:   PainSc: 8                  Ashten Sarnowski A.

## 2021-11-11 NOTE — Progress Notes (Signed)
Inpatient Diabetes Program Recommendations  AACE/ADA: New Consensus Statement on Inpatient Glycemic Control (2015)  Target Ranges:  Prepandial:   less than 140 mg/dL      Peak postprandial:   less than 180 mg/dL (1-2 hours)      Critically ill patients:  140 - 180 mg/dL    Latest Reference Range & Units 11/11/21 07:05 11/11/21 10:01 11/11/21 13:40  Glucose-Capillary 70 - 99 mg/dL 163 (H)  10 mg Decadron @0920  156 (H) 185 (H)  (H): Data is abnormally high    Admit: History of transmetatarsal amputation of right foot  Subacute osteomyelitis of right foot  Cutaneous abscess of right foot  R BKA today 9/74 and application of Wound Vac  History: DM  Home DM Meds: Omni Pod Insulin Pump       Dexcom G6 CGM       Trulicity 1.63 mg Qweek  Current Orders: Novolog Resistant Correction Scale/ SSI (0-20 units) TID AC     Novolog 6 units TID with meals     Trulicity 8.45 mg Qweek   Met w/ pt at bedside.  Pt A&O and able to independently manage insulin pump.  Has Omni Pod insulin pump on her LUA and pump is currently running.  Pt has Dexcom CGM that relays glucose data to her Omni Pod controller--Omni Pod controller with pt at bedside as well (controller used to control basal rates and bolus functions).  Reviewed pump settings with pt (see below).  Pt due to change Pod/Insulin PM of 05/28--Pt will have family bring insulin and extra pump supplies to hospital when they come visit.  Have reached out to Dr. Sharol Given and got orders to Stop the Novolog SSI and the Novolog Meal Coverage and orders placed for Insulin Pump (pt to manage CBGs with her pump)--RN notified of all the above via secure chat.   Endocrinologist: Dr. Hartford Poli with [facility] Last Seen 08/19/2021 Pump Settings were: Basal Rate:1.25 units/hr 30 units total Basal per 24 hour period Insulin to CHO ratio- 1 unit/6 grams of CHO Sensitivity 1 unit drops blood sugar 30 mg/dL Target CBG 125 mg/dl    --Will follow patient during  hospitalization--  Wyn Quaker RN, MSN, CDE Diabetes Coordinator Inpatient Glycemic Control Team Team Pager: [phone] (8a-5p)

## 2021-11-11 NOTE — Progress Notes (Signed)
Orthopedic Tech Progress Note Patient Details:  Valerie Le 09-Mar-1971 [id number]  Patient ID: [name], female   DOB: November 23, 1970, 51 y.o.   MRN: [id number] Order placed with [employer] for outside vendor brace.  [name] 11/11/2021, 11:28 AM

## 2021-11-11 NOTE — H&P (Signed)
Valerie Le is an 51 y.o. female.   Chief Complaint: Increased pain and drainage with ulceration right transmetatarsal amputation. HPI: Patient is a 51 year old woman with diabetes who is status post foot salvage intervention with a transmetatarsal amputation on the right.  Patient despite IV antibiotics and oral antibiotics she has had progressive wound breakdown abscess and osteomyelitis right midfoot.  Past Medical History:  Diagnosis Date   Anemia    Asthma    as a child   Depression    Diabetes mellitus without complication (HCC)    Type 1 - Has Insulin Pump   Headache    migraines   High cholesterol    Hypertension     Past Surgical History:  Procedure Laterality Date   AMPUTATION Right 12/22/2020   Procedure: RIGHT TRANSMETATARSAL AMPUTATION;  Surgeon: Valerie Mustard, MD;  Location: [facility] OR;  Service: Orthopedics;  Laterality: Right;   AMPUTATION Right 06/29/2021   Procedure: REVISION RIGHT TRANSMETATARSAL AMPUTATION;  Surgeon: Valerie Mustard, MD;  Location: [facility] OR;  Service: Orthopedics;  Laterality: Right;   AMPUTATION TOE Right 12/04/2020   Procedure: AMPUTATION RIGHT SECOND TOE, I&D FOOT;  Surgeon: Eldred Manges, MD;  Location: [facility] ORS;  Service: Orthopedics;  Laterality: Right;  Right second toe amputation, I&D right foot.   birth control implant  07/31/2006   Essure Implant   TONSILLECTOMY     TYMPANOSTOMY TUBE PLACEMENT     WISDOM TOOTH EXTRACTION      History reviewed. No pertinent family history. Social History:  reports that she has been smoking cigarettes. She has a 6.25 pack-year smoking history. She has never used smokeless tobacco. She reports that she does not currently use alcohol. She reports that she does not currently use drugs.  Allergies:  Allergies  Allergen Reactions   Albiglutide Itching    Itching site reaction with burning, itching and pain   Cherry Anaphylaxis   Coconut (Cocos Nucifera) Anaphylaxis   Pineapple Anaphylaxis   Tomato  Anaphylaxis   Glimepiride Other (See Comments)    Numbness and abdominal pain   Latex Hives   Metformin And Related Other (See Comments)    Constipation/cramps   Glipizide Diarrhea    No medications prior to admission.    No results found for this or any previous visit (from the past 48 hour(s)). No results found.  Review of Systems  All other systems reviewed and are negative.  Last menstrual period 10/04/2021. Physical Exam  Patient is alert, oriented, no adenopathy, well-dressed, normal affect, normal respiratory effort. Examination patient has 4 new necrotic ulcers on the plantar aspect of the right transmetatarsal amputation the ulcer does probe to bone or about 10 mm in diameter.  Review of the MRI scan shows deep abscess and osteomyelitis throughout the foot.  Patient does have a palpable dorsalis pedis and posterior tibial pulse. Assessment/Plan 1. History of transmetatarsal amputation of right foot (HCC)   2. Subacute osteomyelitis of right foot (HCC)   3. Cutaneous abscess of right foot       Plan: With the progressive infection to the right foot have recommended proceeding with the treatments tibial amputation with biologic tissue graft.  The risk and benefits were discussed including infection neurovascular injury persistent pain need for additional surgery.  Patient states she understands wished to proceed at this time.   Valerie Mustard, MD 11/11/2021, 6:35 AM

## 2021-11-12 LAB — CBC
HCT: 28.1 % — ABNORMAL LOW (ref 36.0–46.0)
Hemoglobin: 9 g/dL — ABNORMAL LOW (ref 12.0–15.0)
MCH: 22.6 pg — ABNORMAL LOW (ref 26.0–34.0)
MCHC: 32 g/dL (ref 30.0–36.0)
MCV: 70.6 fL — ABNORMAL LOW (ref 80.0–100.0)
Platelets: 469 10*3/uL — ABNORMAL HIGH (ref 150–400)
RBC: 3.98 MIL/uL (ref 3.87–5.11)
RDW: 17.9 % — ABNORMAL HIGH (ref 11.5–15.5)
WBC: 18.1 10*3/uL — ABNORMAL HIGH (ref 4.0–10.5)
nRBC: 0 % (ref 0.0–0.2)

## 2021-11-12 LAB — HEMOGLOBIN A1C
Hgb A1c MFr Bld: 7.4 % — ABNORMAL HIGH (ref 4.8–5.6)
Mean Plasma Glucose: 166 mg/dL

## 2021-11-12 LAB — BASIC METABOLIC PANEL
Anion gap: 10 (ref 5–15)
BUN: 13 mg/dL (ref 6–20)
CO2: 21 mmol/L — ABNORMAL LOW (ref 22–32)
Calcium: 8.3 mg/dL — ABNORMAL LOW (ref 8.9–10.3)
Chloride: 102 mmol/L (ref 98–111)
Creatinine, Ser: 1 mg/dL (ref 0.44–1.00)
GFR, Estimated: >60 mL/min (ref >60)
Glucose, Bld: 202 mg/dL — ABNORMAL HIGH (ref 70–99)
Potassium: 4 mmol/L (ref 3.5–5.1)
Sodium: 133 mmol/L — ABNORMAL LOW (ref 135–145)

## 2021-11-12 LAB — GLUCOSE, CAPILLARY
Glucose-Capillary: 184 mg/dL — ABNORMAL HIGH (ref 70–99)
Glucose-Capillary: 171 mg/dL — ABNORMAL HIGH (ref 70–99)
Glucose-Capillary: 180 mg/dL — ABNORMAL HIGH (ref 70–99)
Glucose-Capillary: 243 mg/dL — ABNORMAL HIGH (ref 70–99)
Glucose-Capillary: 169 mg/dL — ABNORMAL HIGH (ref 70–99)

## 2021-11-12 NOTE — Progress Notes (Signed)
Insulin pump patient contract signed by patient and placed in chart.

## 2021-11-12 NOTE — Progress Notes (Signed)
Patient ID: Valerie Le, female   DOB: [birth year], 51 y.o.   MRN: [id number] Patient is postoperative day 1 transtibial amputation.  There is no drainage in the wound VAC canister there is a good suction seal.  Patient's discharge planning based on therapy recommendations.

## 2021-11-12 NOTE — Plan of Care (Signed)
  Problem: Coping: Goal: Level of anxiety will decrease Outcome: Progressing   Problem: Elimination: Goal: Will not experience complications related to bowel motility Outcome: Progressing   Problem: Pain Managment: Goal: General experience of comfort will improve Outcome: Progressing   Problem: Skin Integrity: Goal: Risk for impaired skin integrity will decrease Outcome: Progressing   

## 2021-11-12 NOTE — Progress Notes (Signed)
   11/12/21 1600  What Happened  Was fall witnessed? Yes  Who witnessed fall? Husband - Murad  Patients activity before fall ambulating-unassisted  Point of contact buttocks  Was patient injured? Unsure  Follow Up  MD notified Dr. Archer Asa  Time MD notified [phone]  Family notified Yes - comment  Time family notified [id number]  Additional tests No  Adult Fall Risk Assessment  Risk Factor Category (scoring not indicated) Fall has occurred during this admission (document High fall risk)  Patient Fall Risk Level High fall risk  Adult Fall Risk Interventions  Required Bundle Interventions *See Row Information* High fall risk - low, moderate, and high requirements implemented  Additional Interventions Use of appropriate toileting equipment (bedpan, BSC, etc.);PT/OT need assessed if change in mobility from baseline  Screening for Fall Injury Risk (To be completed on HIGH fall risk patients) - Assessing Need for Floor Mats  Risk For Fall Injury- Criteria for Floor Mats None identified - No additional interventions needed  Oxygen Therapy  SpO2 97 %  O2 Device Room Air  Neurological  Neuro (WDL) WDL  Level of Consciousness Alert  Orientation Level Oriented X4  Cognition Follows commands  Speech Clear  Neuro Symptoms None  Glasgow Coma Scale  Eye Opening 4  Best Verbal Response (NON-intubated) 5  Best Motor Response 6  Glasgow Coma Scale Score 15  Musculoskeletal  Musculoskeletal (WDL) X  Assistive Device BSC;Sabina Lift  Generalized Weakness Yes  Weight Bearing Restrictions Yes  RLE Weight Bearing NWB  Musculoskeletal Details  RLE BKA;Ortho/Supportive Device  RLE Administrator, sports (Comment)  Integumentary  Integumentary (WDL) WDL

## 2021-11-12 NOTE — Evaluation (Signed)
Occupational Therapy Evaluation Patient Details Name: Valerie Le MRN: [id number] DOB: Jun 20, 1970 Today's Date: 11/12/2021   History of Present Illness Pt adm 5/26 with osteomyelitis and abscess of residual rt foot. 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  PMH - HTN, DM, obesity, osteomyelitis of the right foot, rt ray amputation, rt transmet amputation   Clinical Impression   Pt presents with decline in function and safety with ADLs and ADL mobility with impaired balance and endurance. PTA pt lived at home with her husband and teenage children and was Ind with ADLs/selfcare, IADLs, home mgt, was driving and working. Pt currently requires max A with LB ADLs and toileting and mod A with transfers using RW. Pt would benefit from acute OT services to address impairments to maximize level of function and safety      Recommendations for follow up therapy are one component of a multi-disciplinary discharge planning process, led by the attending physician.  Recommendations may be updated based on patient status, additional functional criteria and insurance authorization.   Follow Up Recommendations  Acute inpatient rehab (3hours/day)    Assistance Recommended at Discharge Intermittent Supervision/Assistance  Patient can return home with the following A lot of help with bathing/dressing/bathroom;A lot of help with walking and/or transfers;Assist for transportation;Help with stairs or ramp for entrance;Assistance with cooking/housework    Functional Status Assessment  Patient has had a recent decline in their functional status and demonstrates the ability to make significant improvements in function in a reasonable and predictable amount of time.  Equipment Recommendations  BSC/3in1;Tub/shower bench    Recommendations for Other Services       Precautions / Restrictions Precautions Precautions: Fall Precaution Comments: prior R transmet amputation Restrictions Weight Bearing Restrictions:  Yes RLE Weight Bearing: Non weight bearing      Mobility Bed Mobility               General bed mobility comments: pt seated in recliner upon OT arrival    Transfers Overall transfer level: Needs assistance Equipment used: Rolling walker (2 wheels) Transfers: Sit to/from Stand, Bed to chair/wheelchair/BSC Sit to Stand: Mod assist Stand pivot transfers: Min assist   Step pivot transfers: Mod assist            Balance Overall balance assessment: Needs assistance Sitting-balance support: No upper extremity supported, Feet supported Sitting balance-Leahy Scale: Normal     Standing balance support: Bilateral upper extremity supported, Reliant on assistive device for balance Standing balance-Leahy Scale: Poor                             ADL either performed or assessed with clinical judgement   ADL Overall ADL's : Needs assistance/impaired Eating/Feeding: Independent   Grooming: Wash/dry hands;Wash/dry face;Set up;Sitting   Upper Body Bathing: Set up;Sitting   Lower Body Bathing: Maximal assistance   Upper Body Dressing : Set up;Sitting   Lower Body Dressing: Maximal assistance   Toilet Transfer: Moderate assistance;Stand-pivot;Cueing for safety;Rolling walker (2 wheels) Toilet Transfer Details (indicate cue type and reason): simulated to EOB and back to recliner (no BSC present in room at this time, CNA notified) Toileting- Clothing Manipulation and Hygiene: Maximal assistance;Sit to/from stand       Functional mobility during ADLs: Moderate assistance;Minimal assistance;Rolling walker (2 wheels);Cueing for safety       Vision Baseline Vision/History: 1 Wears glasses Ability to See in Adequate Light: 0 Adequate Patient Visual Report: No change from baseline  Perception     Praxis      Pertinent Vitals/Pain Pain Assessment Pain Assessment: 0-10 Pain Score: 5  Pain Location: rt residual limb Pain Descriptors / Indicators:  Burning, Aching, Sore Pain Intervention(s): Limited activity within patient's tolerance, Monitored during session, Repositioned     Hand Dominance Right   Extremity/Trunk Assessment Upper Extremity Assessment Upper Extremity Assessment: Overall WFL for tasks assessed   Lower Extremity Assessment Lower Extremity Assessment: Defer to PT evaluation RLE Deficits / Details: BKA. AROM rt knee 0 -75 degrees in sitting. Strength grossly 3/5   Cervical / Trunk Assessment Cervical / Trunk Assessment: Normal   Communication Communication Communication: No difficulties   Cognition Arousal/Alertness: Awake/alert Behavior During Therapy: WFL for tasks assessed/performed Overall Cognitive Status: Within Functional Limits for tasks assessed                                       General Comments       Exercises     Shoulder Instructions      Home Living Family/patient expects to be discharged to:: Private residence Living Arrangements: Spouse/significant other Available Help at Discharge: Family;Available 24 hours/day Type of Home: Apartment Home Access: Stairs to enter Entrance Stairs-Number of Steps: 3 Entrance Stairs-Rails: Right;Left;Can reach both Home Layout: One level     Bathroom Shower/Tub: Chief Strategy OfficerTub/shower unit   Bathroom Toilet: Standard     Home Equipment: Cane - quad   Additional Comments: will have assistance of teenage children if needed      Prior Functioning/Environment Prior Level of Function : Independent/Modified Independent;Driving;Working/employed             Mobility Comments: Uses cane at times 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        OT Problem List: Impaired balance (sitting and/or standing);Pain;Decreased coordination;Decreased activity tolerance;Decreased knowledge of use of DME or AE      OT Treatment/Interventions: Self-care/ADL training;DME and/or AE instruction;Balance training;Therapeutic  activities;Therapeutic exercise;Patient/family education    OT Goals(Current goals can be found in the care plan section) Acute Rehab OT Goals Patient Stated Goal: go home OT Goal Formulation: With patient Time For Goal Achievement: 11/26/21 Potential to Achieve Goals: Good ADL Goals Pt Will Perform Lower Body Bathing: with mod assist;with min assist;sitting/lateral leans Pt Will Perform Lower Body Dressing: with mod assist;with min assist;sitting/lateral leans Pt Will Transfer to Toilet: with min assist;with min guard assist;stand pivot transfer;ambulating;bedside commode;grab bars Pt Will Perform Toileting - Clothing Manipulation and hygiene: with mod assist;with min assist;sit to/from stand Additional ADL Goal #1: Pt will correctly don L limb guard in prep for ADL mobility/transfers  OT Frequency: Min 2X/week    Co-evaluation              AM-PAC OT "6 Clicks" Daily Activity     Outcome Measure Help from another person eating meals?: None Help from another person taking care of personal grooming?: A Little Help from another person toileting, which includes using toliet, bedpan, or urinal?: A Lot Help from another person bathing (including washing, rinsing, drying)?: A Lot Help from another person to put on and taking off regular upper body clothing?: A Little Help from another person to put on and taking off regular lower body clothing?: A Lot 6 Click Score: 16   End of Session Equipment Utilized During Treatment: Gait belt;Rolling walker (2 wheels)  Activity Tolerance: Patient tolerated treatment well Patient  left: in chair;with call bell/phone within reach  OT Visit Diagnosis: Unsteadiness on feet (R26.81);Other abnormalities of gait and mobility (R26.89);Pain Pain - Right/Left: Left Pain - part of body: Leg                Time: 1034-1100 OT Time Calculation (min): 26 min Charges:  OT General Charges $OT Visit: 1 Visit OT Evaluation $OT Eval Moderate Complexity: 1  Mod OT Treatments $Therapeutic Activity: 8-22 mins    Galen Manila 11/12/2021, 1:20 PM

## 2021-11-12 NOTE — Plan of Care (Signed)
  Problem: Activity: Goal: Risk for activity intolerance will decrease Outcome: Progressing   Problem: Nutrition: Goal: Adequate nutrition will be maintained Outcome: Progressing   Problem: Coping: Goal: Level of anxiety will decrease Outcome: Progressing   Problem: Elimination: Goal: Will not experience complications related to bowel motility Outcome: Progressing   

## 2021-11-12 NOTE — Progress Notes (Signed)
Subjective: 1 Day Post-Op Procedure(s) (LRB): RIGHT BELOW KNEE AMPUTATION (Right) APPLICATION OF WOUND VAC Patient reports pain as moderate.  Awake and alert.  Stump incisional vac with good seal.  Stump splint intact.  Acute on chronic anemia with stable vitals.  Objective: Vital signs in last 24 hours: Temp:  [97.5 F (36.4 C)-98.6 F (37 C)] 98.6 F (37 C) (05/27 0519) Pulse Rate:  [72-92] 90 (05/27 0519) Resp:  [10-21] 15 (05/27 0519) BP: (119-154)/(62-84) 127/68 (05/27 0519) SpO2:  [92 %-99 %] 93 % (05/27 0519)  Intake/Output from previous day: 05/26 0701 - 05/27 0700 In: 910 [I.V.:400; IV Piggyback:510] Out: 10 [Blood:10] Intake/Output this shift: No intake/output data recorded.  Recent Labs    11/11/21 0656 11/12/21 0157  HGB 9.5* 9.0*   Recent Labs    11/11/21 0656 11/12/21 0157  WBC 18.7* 18.1*  RBC 4.17 3.98  HCT 29.8* 28.1*  PLT 512* 469*   Recent Labs    11/12/21 0157  NA 133*  K 4.0  CL 102  CO2 21*  BUN 13  CREATININE 1.00  GLUCOSE 202*  CALCIUM 8.3*   No results for input(s): LABPT, INR in the last 72 hours.  Incision: dressing C/D/I Incisional VAC intact with good seal  Assessment/Plan: 1 Day Post-Op Procedure(s) (LRB): RIGHT BELOW KNEE AMPUTATION (Right) APPLICATION OF WOUND VAC Up with therapy  Disposition depending on mobility with therapy.    Valerie Le 11/12/2021, 7:42 AM

## 2021-11-12 NOTE — Evaluation (Signed)
Physical Therapy Evaluation Patient Details Name: Valerie Le MRN: [id number] DOB: 1970/09/22 Today's Date: 11/12/2021  History of Present Illness  Pt adm 5/26 with osteomyelitis and abscess of residual rt foot. 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  PMH - HTN, DM, obesity, osteomyelitis of the right foot, rt ray amputation, rt transmet amputation  Clinical Impression  Pt presents to PT with decr mobility due to decr in strength and balance as well as pain after rt BKA. Pt was very independent and working prior to admission and feel she could benefit from acute inpatient rehab prior to return home. Pt very motivated and feel she could reach modified independent level with inpatient rehab.        Recommendations for follow up therapy are one component of a multi-disciplinary discharge planning process, led by the attending physician.  Recommendations may be updated based on patient status, additional functional criteria and insurance authorization.  Follow Up Recommendations Acute inpatient rehab (3hours/day)    Assistance Recommended at Discharge Intermittent Supervision/Assistance  Patient can return home with the following  A little help with walking and/or transfers;A little help with bathing/dressing/bathroom;Assistance with cooking/housework;Assist for transportation;Help with stairs or ramp for entrance    Equipment Recommendations Rolling walker (2 wheels);Wheelchair (measurements PT);Wheelchair cushion (measurements PT)  Recommendations for Other Services  Rehab consult    Functional Status Assessment Patient has had a recent decline in their functional status and demonstrates the ability to make significant improvements in function in a reasonable and predictable amount of time.     Precautions / Restrictions Precautions Precautions: Fall      Mobility  Bed Mobility Overal bed mobility: Needs Assistance Bed Mobility: Supine to Sit     Supine to sit: Min guard, HOB  elevated     General bed mobility comments: Incr time and effort    Transfers Overall transfer level: Needs assistance Equipment used: Rolling walker (2 wheels) Transfers: Sit to/from Stand, Bed to chair/wheelchair/BSC Sit to Stand: +2 physical assistance, Min assist   Step pivot transfers: +2 physical assistance, Min assist       General transfer comment: Assist to bring hips up and for balance especially with transition of hand from surface of the bed to the walker. Pt used walker for little pivotal hops from bed to chair with +2 min assist for balance and support.    Ambulation/Gait         Gait velocity: Did not attempt        Stairs            Wheelchair Mobility    Modified Rankin (Stroke Patients Only)       Balance Overall balance assessment: Needs assistance Sitting-balance support: No upper extremity supported, Feet supported Sitting balance-Leahy Scale: Normal     Standing balance support: Bilateral upper extremity supported, Reliant on assistive device for balance Standing balance-Leahy Scale: Poor Standing balance comment: walker and min guard assist for static standing                             Pertinent Vitals/Pain Pain Assessment Pain Assessment: 0-10 Pain Score: 5  Pain Location: rt residual limb Pain Descriptors / Indicators: Dull, Aching Pain Intervention(s): Limited activity within patient's tolerance, Premedicated before session, Repositioned    Home Living Family/patient expects to be discharged to:: Private residence Living Arrangements: Spouse/significant other Available Help at Discharge: Family;Available 24 hours/day Type of Home: Apartment Home Access: Stairs to enter Entrance  Stairs-Rails: Right;Left;Can reach both Entrance Stairs-Number of Steps: 3   Home Layout: One level Home Equipment: Cane - quad      Prior Function Prior Level of Function : Independent/Modified  Independent;Driving;Working/employed             Mobility Comments: Uses cane at times       Hand Dominance   Dominant Hand: Right    Extremity/Trunk Assessment   Upper Extremity Assessment Upper Extremity Assessment: Defer to OT evaluation    Lower Extremity Assessment Lower Extremity Assessment: RLE deficits/detail RLE Deficits / Details: BKA. AROM rt knee 0 -75 degrees in sitting. Strength grossly 3/5       Communication   Communication: No difficulties  Cognition                                                General Comments      Exercises     Assessment/Plan    PT Assessment Patient needs continued PT services  PT Problem List Decreased strength;Decreased range of motion;Decreased balance;Decreased mobility;Decreased knowledge of use of DME;Obesity;Pain       PT Treatment Interventions DME instruction;Gait training;Stair training;Functional mobility training;Therapeutic activities;Therapeutic exercise;Balance training;Patient/family education;Wheelchair mobility training    PT Goals (Current goals can be found in the Care Plan section)  Acute Rehab PT Goals Patient Stated Goal: return home PT Goal Formulation: With patient Time For Goal Achievement: 11/26/21 Potential to Achieve Goals: Good    Frequency Min 4X/week     Co-evaluation               AM-PAC PT "6 Clicks" Mobility  Outcome Measure Help needed turning from your back to your side while in a flat bed without using bedrails?: A Little Help needed moving from lying on your back to sitting on the side of a flat bed without using bedrails?: A Little Help needed moving to and from a bed to a chair (including a wheelchair)?: Total Help needed standing up from a chair using your arms (e.g., wheelchair or bedside chair)?: Total Help needed to walk in hospital room?: Total Help needed climbing 3-5 steps with a railing? : Total 6 Click Score: 10    End of Session  Equipment Utilized During Treatment: Gait belt;Other (comment) (limb protector - fell off during transfer) Activity Tolerance: Patient tolerated treatment well Patient left: in chair;with call bell/phone within reach;with chair alarm set Nurse Communication: Mobility status PT Visit Diagnosis: Unsteadiness on feet (R26.81);Other abnormalities of gait and mobility (R26.89);Difficulty in walking, not elsewhere classified (R26.2);Pain Pain - Right/Left: Left Pain - part of body: Leg    Time: 1010-1029 PT Time Calculation (min) (ACUTE ONLY): 19 min   Charges:   PT Evaluation $PT Eval Moderate Complexity: 1 Mod          [facility] PT Acute Rehabilitation Services Office [phone]   Angelina Ok [facility] 11/12/2021, 11:53 AM

## 2021-11-12 NOTE — Progress Notes (Signed)
Inpatient Rehab Admissions Coordinator:    CIR consult received. I await PT/OT evaluations with recommendations at this time.  I will follow up once those are available.  Megan Salon, MS, CCC-SLP Rehab Admissions Coordinator  [phone] (celll) [phone] (office)

## 2021-11-13 LAB — BASIC METABOLIC PANEL
Anion gap: 6 (ref 5–15)
BUN: 22 mg/dL — ABNORMAL HIGH (ref 6–20)
CO2: 27 mmol/L (ref 22–32)
Calcium: 8.4 mg/dL — ABNORMAL LOW (ref 8.9–10.3)
Chloride: 105 mmol/L (ref 98–111)
Creatinine, Ser: 1.11 mg/dL — ABNORMAL HIGH (ref 0.44–1.00)
GFR, Estimated: >60 mL/min (ref >60)
Glucose, Bld: 145 mg/dL — ABNORMAL HIGH (ref 70–99)
Potassium: 3.8 mmol/L (ref 3.5–5.1)
Sodium: 138 mmol/L (ref 135–145)

## 2021-11-13 LAB — GLUCOSE, CAPILLARY
Glucose-Capillary: 117 mg/dL — ABNORMAL HIGH (ref 70–99)
Glucose-Capillary: 199 mg/dL — ABNORMAL HIGH (ref 70–99)
Glucose-Capillary: 146 mg/dL — ABNORMAL HIGH (ref 70–99)
Glucose-Capillary: 175 mg/dL — ABNORMAL HIGH (ref 70–99)

## 2021-11-13 LAB — CBC
HCT: 25.1 % — ABNORMAL LOW (ref 36.0–46.0)
Hemoglobin: 8.1 g/dL — ABNORMAL LOW (ref 12.0–15.0)
MCH: 22.6 pg — ABNORMAL LOW (ref 26.0–34.0)
MCHC: 32.3 g/dL (ref 30.0–36.0)
MCV: 69.9 fL — ABNORMAL LOW (ref 80.0–100.0)
Platelets: 437 10*3/uL — ABNORMAL HIGH (ref 150–400)
RBC: 3.59 MIL/uL — ABNORMAL LOW (ref 3.87–5.11)
RDW: 17.6 % — ABNORMAL HIGH (ref 11.5–15.5)
WBC: 17.7 10*3/uL — ABNORMAL HIGH (ref 4.0–10.5)
nRBC: 0 % (ref 0.0–0.2)

## 2021-11-13 NOTE — Plan of Care (Signed)
  Problem: Health Behavior/Discharge Planning: Goal: Ability to manage health-related needs will improve Outcome: Progressing   Problem: Activity: Goal: Risk for activity intolerance will decrease Outcome: Progressing   Problem: Pain Managment: Goal: General experience of comfort will improve Outcome: Progressing   Problem: Safety: Goal: Ability to remain free from injury will improve Outcome: Progressing   Problem: Skin Integrity: Goal: Risk for impaired skin integrity will decrease Outcome: Progressing   

## 2021-11-13 NOTE — Progress Notes (Signed)
Patient ID: Valerie Le, female   DOB: 12-18-1970, 51 y.o.   MRN: [id number] The patient is awake and alert this morning.  Her vital signs are stable.  Hemoglobin is 8.1.  She is very motivated.  However, she does report that she had a fall yesterday.  She was sitting in her chair with family in the room including her sons.  She did try to get up to go the bathroom and had a fall on her bottom.  She said she is a little bit more sore because of that fall.  The end of her stump there is intact dressing and the incisional VAC has not had any increased output at all and has a good seal.  Her knee is nice and straight.  The stump brace did slide off her leg.  This will need to be tightened at some point.  However she understands completely the need to keep her knee straight when she is in bed and she is very compliant with that.  I did see the notes from therapy that it sounds like short-term inpatient rehab has been recommended which is certainly appropriate for someone such as her who is highly motivated.

## 2021-11-14 LAB — GLUCOSE, CAPILLARY
Glucose-Capillary: 146 mg/dL — ABNORMAL HIGH (ref 70–99)
Glucose-Capillary: 114 mg/dL — ABNORMAL HIGH (ref 70–99)
Glucose-Capillary: 130 mg/dL — ABNORMAL HIGH (ref 70–99)
Glucose-Capillary: 143 mg/dL — ABNORMAL HIGH (ref 70–99)
Glucose-Capillary: 120 mg/dL — ABNORMAL HIGH (ref 70–99)
Glucose-Capillary: 113 mg/dL — ABNORMAL HIGH (ref 70–99)

## 2021-11-14 NOTE — Progress Notes (Signed)
Inpatient Rehab Admissions Coordinator:     I spoke with pt. And husband regarding potential CIR admit. They state interest and husband can provide 24/7 support at home. I will open a case with insurance and follow for potential admit pending bed availability.  Megan Salon, MS, CCC-SLP Rehab Admissions Coordinator  [phone] (celll) [phone] (office)

## 2021-11-14 NOTE — Progress Notes (Signed)
Physical Therapy Treatment Patient Details Name: Valerie Le MRN: [id number] DOB: 02-25-1971 Today's Date: 11/14/2021   History of Present Illness Pt adm 5/26 with osteomyelitis and abscess of residual rt foot. 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  PMH - HTN, DM, obesity, osteomyelitis of the right foot, rt ray amputation, rt transmet amputation    PT Comments    Pt with apprehension about mobilization secondary to fall two days ago. PT providing maximal encouragement. Pt able to pivot from bed to [facility] to chair with min A x2. Then pt able to perform 2 outs of hopping with close chair follow and min A for steadying. D/c plans remain appropriate.    Recommendations for follow up therapy are one component of a multi-disciplinary discharge planning process, led by the attending physician.  Recommendations may be updated based on patient status, additional functional criteria and insurance authorization.  Follow Up Recommendations  Acute inpatient rehab (3hours/day)     Assistance Recommended at Discharge Intermittent Supervision/Assistance  Patient can return home with the following A little help with walking and/or transfers;A little help with bathing/dressing/bathroom;Assistance with cooking/housework;Assist for transportation;Help with stairs or ramp for entrance   Equipment Recommendations  Rolling walker (2 wheels);Wheelchair (measurements PT);Wheelchair cushion (measurements PT)    Recommendations for Other Services Rehab consult     Precautions / Restrictions Precautions Precautions: Fall Precaution Comments: wound vac Restrictions Weight Bearing Restrictions: Yes RLE Weight Bearing: Non weight bearing     Mobility  Bed Mobility Overal bed mobility: Needs Assistance Bed Mobility: Supine to Sit     Supine to sit: Min guard, HOB elevated     General bed mobility comments: Incr time and effort    Transfers Overall transfer level: Needs assistance Equipment used:  Rolling walker (2 wheels) Transfers: Sit to/from Stand, Bed to chair/wheelchair/BSC Sit to Stand: +2 physical assistance, Min assist   Step pivot transfers: +2 physical assistance, Min assist       General transfer comment: min A for power up and steadying, increased cuing for hand placement for power up. Pt able to pivot bed to [facility] to recliner and stand up from recliner to hop    Ambulation/Gait Ambulation/Gait assistance: Min assist, +2 physical assistance, +2 safety/equipment Gait Distance (Feet): 6 Feet (+3') Assistive device: Rolling walker (2 wheels) Gait Pattern/deviations: Step-to pattern, Trunk flexed, Antalgic Gait velocity: slowed Gait velocity interpretation: <1.31 ft/sec, indicative of household ambulator   General Gait Details: with increased encouragement pt set up with opportunity for straight line mobilization, min A for steadying, with close chair follow, vc for porximity to RW to provide mechanical advantage for tricep pushup and steadying       Balance Overall balance assessment: Needs assistance Sitting-balance support: No upper extremity supported, Feet supported Sitting balance-Leahy Scale: Normal     Standing balance support: Bilateral upper extremity supported, Reliant on assistive device for balance Standing balance-Leahy Scale: Poor Standing balance comment: walker and min guard assist for static standing                            Cognition Arousal/Alertness: Awake/alert Behavior During Therapy: Anxious Overall Cognitive Status: Within Functional Limits for tasks assessed                                 General Comments: After fall over the weekend pt with apprehension with OOB, PT providing increased encouragement  General Comments General comments (skin integrity, edema, etc.): VSS on RA      Pertinent Vitals/Pain Pain Assessment Pain Assessment: 0-10 Pain Score: 5  Pain Location: rt residual  limb Pain Descriptors / Indicators: Dull, Aching Pain Intervention(s): Limited activity within patient's tolerance, Monitored during session, Repositioned     PT Goals (current goals can now be found in the care plan section) Acute Rehab PT Goals Patient Stated Goal: return home PT Goal Formulation: With patient Time For Goal Achievement: 11/26/21 Potential to Achieve Goals: Good Progress towards PT goals: Progressing toward goals    Frequency    Min 4X/week      PT Plan Current plan remains appropriate       AM-PAC PT "6 Clicks" Mobility   Outcome Measure  Help needed turning from your back to your side while in a flat bed without using bedrails?: A Little Help needed moving from lying on your back to sitting on the side of a flat bed without using bedrails?: A Little Help needed moving to and from a bed to a chair (including a wheelchair)?: Total Help needed standing up from a chair using your arms (e.g., wheelchair or bedside chair)?: Total Help needed to walk in hospital room?: Total Help needed climbing 3-5 steps with a railing? : Total 6 Click Score: 10    End of Session Equipment Utilized During Treatment: Gait belt;Other (comment) (limb protector - fell off during transfer) Activity Tolerance: Patient tolerated treatment well Patient left: in chair;with call bell/phone within reach;with chair alarm set Nurse Communication: Mobility status PT Visit Diagnosis: Unsteadiness on feet (R26.81);Other abnormalities of gait and mobility (R26.89);Difficulty in walking, not elsewhere classified (R26.2);Pain Pain - Right/Left: Left Pain - part of body: Leg     Time: [id number] PT Time Calculation (min) (ACUTE ONLY): 32 min  Charges:  $Gait Training: 8-22 mins $Therapeutic Activity: 8-22 mins                     Aleisha Paone B. Migdalia Dk PT, DPT Acute Rehabilitation Services Please use secure chat or  Call Office [phone]    [place] 11/14/2021,  4:50 PM

## 2021-11-14 NOTE — Progress Notes (Signed)
   11/14/21 1400  Mobility  Activity Refused mobility (Pt declined requested to return later)   Will f/u as time permits.  [place] Schneur Crowson, CMS, BS EXP Acute Rehabilitation Services  [phone] Office: [phone]

## 2021-11-14 NOTE — Progress Notes (Signed)
Patient ID: Valerie Le, female   DOB: Jun 24, 1970, 51 y.o.   MRN: [id number] The patient is awake and alert this morning.  Her stump brace was replaced. Had some pain with getting to commode although this is improved. Wound vac in place holding strong suction seal. Plan for is for inpatient rehab, pending placement. Overall doing very well.

## 2021-11-15 ENCOUNTER — Encounter (HOSPITAL_COMMUNITY): Payer: Self-pay | Admitting: Orthopedic Surgery

## 2021-11-15 LAB — GLUCOSE, CAPILLARY
Glucose-Capillary: 163 mg/dL — ABNORMAL HIGH (ref 70–99)
Glucose-Capillary: 238 mg/dL — ABNORMAL HIGH (ref 70–99)
Glucose-Capillary: 202 mg/dL — ABNORMAL HIGH (ref 70–99)
Glucose-Capillary: 161 mg/dL — ABNORMAL HIGH (ref 70–99)

## 2021-11-15 LAB — SURGICAL PATHOLOGY

## 2021-11-15 NOTE — Discharge Summary (Signed)
Discharge Diagnoses:  Principal Problem:   Below-knee amputation of right lower extremity (HCC)   Surgeries: Procedure(s): RIGHT BELOW KNEE AMPUTATION APPLICATION OF WOUND VAC on 11/11/2021    Consultants:   Discharged Condition: Improved  Hospital Course: Valerie Le is an 51 y.o. female who was admitted 11/11/2021 with a chief complaint of abscess right foot, with a final diagnosis of Osteomyelitis Abscess Right Foot.  Patient was brought to the operating room on 11/11/2021 and underwent Procedure(s): RIGHT BELOW KNEE AMPUTATION APPLICATION OF WOUND VAC.    Patient was given perioperative antibiotics:  Anti-infectives (From admission, onward)    Start     Dose/Rate Route Frequency Ordered Stop   11/11/21 1700  ceFAZolin (ANCEF) IVPB 2g/100 mL premix        2 g 200 mL/hr over 30 Minutes Intravenous Every 8 hours 11/11/21 1334 11/12/21 0918   11/11/21 0700  ceFAZolin (ANCEF) IVPB 3g/100 mL premix        3 g 200 mL/hr over 30 Minutes Intravenous On call to O.R. 11/11/21 2751 11/11/21 0920     .  Patient was given sequential compression devices, early ambulation, and aspirin for DVT prophylaxis.  Recent vital signs: Patient Vitals for the past 24 hrs:  BP Temp Temp src Pulse Resp SpO2  11/15/21 0822 (!) 154/80 98.5 F (36.9 C) Oral (!) 101 17 (!) 86 %  11/15/21 0422 130/70 -- -- (!) 110 16 94 %  11/14/21 2020 133/71 99.4 F (37.4 C) Oral 100 17 94 %  11/14/21 1700 137/76 98 F (36.7 C) Oral 95 18 93 %  .  Recent laboratory studies: No results found.  Discharge Medications:   Allergies as of 11/15/2021       Reactions   Albiglutide Itching   Itching site reaction with burning, itching and pain   Cherry Anaphylaxis   Coconut (cocos Nucifera) Anaphylaxis   Pineapple Anaphylaxis   Tomato Anaphylaxis   Glimepiride Other (See Comments)   Numbness and abdominal pain   Latex Hives   Metformin And Related Other (See Comments)   Constipation/cramps    Glipizide Diarrhea        Medication List     STOP taking these medications    doxycycline 100 MG tablet Commonly known as: VIBRA-TABS       TAKE these medications    amitriptyline 25 MG tablet Commonly known as: ELAVIL Take 25 mg by mouth at bedtime.   amLODipine 10 MG tablet Commonly known as: NORVASC Take 10 mg by mouth at bedtime.   ammonium lactate 12 % cream Commonly known as: AMLACTIN Apply 1 application topically daily as needed for dry skin (adter showers).   aspirin EC 81 MG tablet Take 81 mg by mouth at bedtime.   atorvastatin 80 MG tablet Commonly known as: LIPITOR Take 80 mg by mouth at bedtime.   Baqsimi One Pack 3 MG/DOSE Powd Generic drug: Glucagon Place 3 mg into the nose as needed (for blood sugar 60 or below).   budesonide 180 MCG/ACT inhaler Commonly known as: PULMICORT Inhale 1 puff into the lungs 3 (three) times daily as needed (shortness of breath).   D3-1000 25 MCG (1000 UT) capsule Generic drug: Cholecalciferol Take 1,000 Units by mouth at bedtime.   Dexcom G6 Receiver Devi change EVERY 90 days   Dexcom G6 Sensor Misc use to check blood sugar change every 10 days   Dexcom G6 Transmitter Misc Dispense and use as directed   FeroSul 325 (65 FE) MG  tablet Generic drug: ferrous sulfate Take 1 tablet (325 mg total) by mouth 2 (two) times daily.   Fish Oil 1000 MG Caps Take 1,000 mg by mouth at bedtime.   gabapentin 300 MG capsule Commonly known as: NEURONTIN Take 1 capsule (300 mg total) by mouth at bedtime. 3 times a day when necessary neuropathy pain- prn What changed: additional instructions   ibuprofen 200 MG tablet Commonly known as: ADVIL Take 600 mg by mouth every 6 (six) hours as needed for mild pain.   insulin pump Soln Inject into the skin continuous. 200 units every three days   L-Arginine 1000 MG Tabs Take 1,000 mg by mouth daily.   lisinopril 40 MG tablet Commonly known as: ZESTRIL Take 40 mg by mouth at  bedtime.   metoCLOPramide 10 MG tablet Commonly known as: REGLAN Take 10 mg by mouth every 6 (six) hours as needed for nausea.   nitroGLYCERIN 0.2 mg/hr patch Commonly known as: NITRODUR - Dosed in mg/24 hr Place 1 patch (0.2 mg total) onto the skin daily.   ondansetron 4 MG tablet Commonly known as: ZOFRAN Take 1 tablet (4 mg total) by mouth every 6 (six) hours as needed for nausea.   oxyCODONE-acetaminophen 5-325 MG tablet Commonly known as: PERCOCET/ROXICET Take 1 tablet by mouth every 6 (six) hours as needed.   sodium chloride 0.65 % nasal spray Commonly known as: OCEAN Place 1-2 sprays into the nose as needed for congestion.   SUMAtriptan 25 MG tablet Commonly known as: IMITREX Take 25 mg by mouth every 2 (two) hours as needed for migraine. May repeat in 2 hours if headache persists or recurs.   Toujeo Max SoloStar 300 UNIT/ML Solostar Pen Generic drug: insulin glargine (2 Unit Dial) Inject 300 Units into the skin once a week.   triamcinolone 0.025 % cream Commonly known as: KENALOG 1 application. daily as needed (rash).   Trulicity 0.75 MG/0.5ML Sopn Generic drug: Dulaglutide Inject 0.75 mg into the skin every Sunday.   Unifine Pentips Plus 31G X 5 MM Misc Generic drug: Insulin Pen Needle use 1 pen needle 5 times per day   varenicline 1 MG tablet Commonly known as: CHANTIX Take 1 mg by mouth at bedtime.   vitamin C 500 MG tablet Commonly known as: ASCORBIC ACID Take 500 mg by mouth at bedtime.        Diagnostic Studies: DG Chest 2 View  Result Date: 11/01/2021 CLINICAL DATA:  Chest pain, shortness of breath EXAM: CHEST - 2 VIEW COMPARISON:  None Available. FINDINGS: Lungs are clear.  No pleural effusion or pneumothorax. The heart is normal in size. Visualized osseous structures are within normal limits. IMPRESSION: Normal chest radiographs. Electronically Signed   By: Charline BillsSriyesh  Krishnan M.D.   On: 11/01/2021 21:30   MR FOOT RIGHT WO CONTRAST  Result  Date: 11/02/2021 CLINICAL DATA:  Diabetic with right foot swelling. History of partial amputation for osteomyelitis. Transmetatarsal amputation 12/22/2020 with revision 06/29/2021. EXAM: MRI OF THE RIGHT FOREFOOT WITHOUT CONTRAST TECHNIQUE: Multiplanar, multisequence MR imaging of the right foot was performed. No intravenous contrast was administered. COMPARISON:  MRI 12/03/2020. Radiographs 11/02/2021, 05/03/2021 and 12/02/2020. FINDINGS: Bones/Joint/Cartilage Examination includes most of the remaining foot status post transmetatarsal amputation. Compared with the previous MRI, there is new subchondral marrow T2 hyperintensity and decreased T1 signal within the base of the 5th metatarsal and the adjacent cuboid. No gross cortical destruction. There is mild marrow T2 hyperintensity within the remaining base of the 5th metatarsal and distally  within the remaining 4th metatarsal. No significant abnormalities and the additional metatarsal bases or tarsal bones. No significant joint effusions. Ligaments Intact Lisfranc ligament. Muscles and Tendons Generalized muscular atrophy. Mild T2 hyperintensity surrounding the flexor tendons in the proximal forefoot. No significant focal intramuscular fluid collections. Soft tissues Plantar soft tissue ulceration laterally along the base of the 5th metatarsal with underlying emphysema, decreased T1 signal and heterogeneous T2 hyperintensity in the surrounding soft tissues. These soft tissue changes abut the 5th tarsometatarsal articulation, such that the underlying marrow changes are at least moderately suspicious for osteomyelitis. No drainable soft tissue abscess. IMPRESSION: 1. Soft tissue ulceration along the plantar base of the 5th metatarsal with underlying soft tissue emphysema and inflammatory changes consistent with soft tissue infection. No drainable abscess identified. 2. Marrow changes in the 5th metatarsal base and adjacent cuboid have developed from the previous MRI  of 11 months ago, and given proximity to the adjacent soft tissue findings could reflect osteomyelitis. No cortical destruction or significant joint effusions. 3. Previous transmetatarsal amputation through the forefoot with mild nonspecific marrow T2 hyperintensity distally in the remaining 4th and 5th metatarsals. Electronically Signed   By: Carey Bullocks M.D.   On: 11/02/2021 08:37   NM Myocar Multi W/Spect Izetta Dakin Motion / EF  Result Date: 11/03/2021 CLINICAL DATA:  Chest pain. Intermediate risk coronary artery disease. EXAM: MYOCARDIAL IMAGING WITH SPECT (REST AND PHARMACOLOGIC-STRESS) GATED LEFT VENTRICULAR WALL MOTION STUDY LEFT VENTRICULAR EJECTION FRACTION TECHNIQUE: Standard myocardial SPECT imaging was performed after resting intravenous injection of 10.4 mCi Tc-65m tetrofosmin. Subsequently, intravenous infusion of Lexiscan was performed under the supervision of the Cardiology staff. At peak effect of the drug, 31.4 mCi Tc-74m tetrofosmin was injected intravenously and standard myocardial SPECT imaging was performed. Quantitative gated imaging was also performed to evaluate left ventricular wall motion, and estimate left ventricular ejection fraction. COMPARISON:  None Available. FINDINGS: Perfusion: No decreased activity in the left ventricle on stress imaging to suggest reversible ischemia or infarction. Wall Motion: Normal left ventricular wall motion. No left ventricular dilation. Left Ventricular Ejection Fraction: 74 % End diastolic volume 99 ml End systolic volume 25 ml IMPRESSION: 1. No reversible ischemia or infarction. 2. Normal left ventricular wall motion. 3. Left ventricular ejection fraction 74% 4. Non invasive risk stratification*: Low *2012 Appropriate Use Criteria for Coronary Revascularization Focused Update: J Am Coll Cardiol. 2012;59(9):857-881. http://content.dementiazones.com.aspx?articleid=1201161 Electronically Signed   By: Danae Orleans M.D.   On: 11/03/2021 14:30   DG  CHEST PORT 1 VIEW  Result Date: 11/04/2021 CLINICAL DATA:  Hypoxia. EXAM: PORTABLE CHEST 1 VIEW COMPARISON:  Nov 01, 2021. FINDINGS: The heart size and mediastinal contours are within normal limits. Both lungs are clear. The visualized skeletal structures are unremarkable. IMPRESSION: No active disease. Electronically Signed   By: Lupita Raider M.D.   On: 11/04/2021 11:13   DG Foot Complete Right  Result Date: 11/02/2021 CLINICAL DATA:  foot infection on plantar surface, prior amputation for osteo, eval signs of new osteo EXAM: RIGHT FOOT COMPLETE - 3+ VIEW COMPARISON:  05/03/2021 FINDINGS: Previous transmetatarsal amputation. Interval cortical loss involving the proximal shaft of the fifth metatarsal. There has been some subtle cortical loss at the amputation margin of the fourth metatarsal shaft. There is regional soft tissue swelling. There is plantar subcutaneous gas at the level of the midfoot. IMPRESSION: Findings consistent with fourth and fifth metatarsal osteomyelitis. Electronically Signed   By: Corlis Leak M.D.   On: 11/02/2021 05:29   ECHOCARDIOGRAM COMPLETE  Result Date: 11/02/2021    ECHOCARDIOGRAM REPORT   Patient Name:   Valerie Le Date of Exam: 11/02/2021 Medical Rec #:  [id number]              Height:       71.0 in Accession #:    [id number]             Weight:       265.0 lb Date of Birth:  [birth year]             BSA:          2.376 m Patient Age:    50 years               BP:           120/71 mmHg Patient Gender: F                      HR:           82 bpm. Exam Location:  Inpatient Procedure: 2D Echo, Color Doppler and Cardiac Doppler Indications:    Elevated troponin  History:        Patient has no prior history of Echocardiogram examinations.                 Signs/Symptoms:Chest Pain; Risk Factors:Diabetes, Hypertension,                 Current Smoker and HLD.  Sonographer:    Rodrigo Ran RCS Referring Phys: [phone] RONDELL A SMITH IMPRESSIONS  1. Left ventricular  ejection fraction, by estimation, is 60 to 65%. The left ventricle has normal function. The left ventricle has no regional wall motion abnormalities. Left ventricular diastolic parameters were normal.  2. Right ventricular systolic function is normal. The right ventricular size is normal.  3. The mitral valve is normal in structure. Trivial mitral valve regurgitation.  4. The aortic valve is normal in structure. Aortic valve regurgitation is not visualized. Aortic valve sclerosis is present, with no evidence of aortic valve stenosis. FINDINGS  Left Ventricle: Left ventricular ejection fraction, by estimation, is 60 to 65%. The left ventricle has normal function. The left ventricle has no regional wall motion abnormalities. The left ventricular internal cavity size was normal in size. There is  no left ventricular hypertrophy. Left ventricular diastolic parameters were normal. Right Ventricle: The right ventricular size is normal. No increase in right ventricular wall thickness. Right ventricular systolic function is normal. Left Atrium: Left atrial size was normal in size. Right Atrium: Right atrial size was normal in size. Pericardium: There is no evidence of pericardial effusion. Mitral Valve: The mitral valve is normal in structure. Trivial mitral valve regurgitation. Tricuspid Valve: The tricuspid valve is normal in structure. Tricuspid valve regurgitation is not demonstrated. No evidence of tricuspid stenosis. Aortic Valve: The aortic valve is normal in structure. Aortic valve regurgitation is not visualized. Aortic valve sclerosis is present, with no evidence of aortic valve stenosis. Aortic valve mean gradient measures 9.0 mmHg. Aortic valve peak gradient measures 19.4 mmHg. Aortic valve area, by VTI measures 2.14 cm. Pulmonic Valve: The pulmonic valve was normal in structure. Pulmonic valve regurgitation is not visualized. No evidence of pulmonic stenosis. Aorta: The aortic root is normal in size and  structure. IAS/Shunts: The interatrial septum was not assessed.  LEFT VENTRICLE PLAX 2D LVIDd:         5.10 cm   Diastology LVIDs:  3.10 cm   LV e' medial:    8.49 cm/s LV PW:         0.90 cm   LV E/e' medial:  10.5 LV IVS:        0.60 cm   LV e' lateral:   13.80 cm/s LVOT diam:     2.00 cm   LV E/e' lateral: 6.5 LV SV:         88 LV SV Index:   37 LVOT Area:     3.14 cm  RIGHT VENTRICLE             IVC RV S prime:     20.70 cm/s  IVC diam: 1.70 cm TAPSE (M-mode): 2.3 cm LEFT ATRIUM           Index LA diam:      3.00 cm 1.26 cm/m LA Vol (A4C): 39.9 ml 16.79 ml/m  AORTIC VALVE                     PULMONIC VALVE AV Area (Vmax):    1.74 cm      PV Vmax:       0.96 m/s AV Area (Vmean):   2.18 cm      PV Peak grad:  3.7 mmHg AV Area (VTI):     2.14 cm AV Vmax:           220.00 cm/s AV Vmean:          138.000 cm/s AV VTI:            0.412 m AV Peak Grad:      19.4 mmHg AV Mean Grad:      9.0 mmHg LVOT Vmax:         122.00 cm/s LVOT Vmean:        95.600 cm/s LVOT VTI:          0.281 m LVOT/AV VTI ratio: 0.68  AORTA Ao Root diam: 2.60 cm Ao Asc diam:  2.90 cm MITRAL VALVE MV Area (PHT): 6.83 cm    SHUNTS MV Decel Time: 111 msec    Systemic VTI:  0.28 m MV E velocity: 89.10 cm/s  Systemic Diam: 2.00 cm Truett Mainland MD Electronically signed by Truett Mainland MD Signature Date/Time: 11/02/2021/3:22:00 PM    Final     Patient benefited maximally from their hospital stay and there were no complications.     Disposition: Discharge disposition: 02-Transferred to [facility]      Discharge Instructions     Call MD / Call 911   Complete by: As directed    If you experience chest pain or shortness of breath, CALL 911 and be transported to the hospital emergency room.  If you develope a fever above 101 F, pus (white drainage) or increased drainage or redness at the wound, or calf pain, call your surgeon's office.   Constipation Prevention   Complete by: As directed    Drink plenty of  fluids.  Prune juice may be helpful.  You may use a stool softener, such as Colace (over the counter) 100 mg twice a day.  Use MiraLax (over the counter) for constipation as needed.   Diet - low sodium heart healthy   Complete by: As directed    Increase activity slowly as tolerated   Complete by: As directed    Negative Pressure Wound Therapy - Incisional   Complete by: As directed    Post-operative opioid taper instructions:   Complete  by: As directed    POST-OPERATIVE OPIOID TAPER INSTRUCTIONS: It is important to wean off of your opioid medication as soon as possible. If you do not need pain medication after your surgery it is ok to stop day one. Opioids include: Codeine, Hydrocodone(Norco, Vicodin), Oxycodone(Percocet, oxycontin) and hydromorphone amongst others.  Long term and even short term use of opiods can cause: Increased pain response Dependence Constipation Depression Respiratory depression And more.  Withdrawal symptoms can include Flu like symptoms Nausea, vomiting And more Techniques to manage these symptoms Hydrate well Eat regular healthy meals Stay active Use relaxation techniques(deep breathing, meditating, yoga) Do Not substitute Alcohol to help with tapering If you have been on opioids for less than two weeks and do not have pain than it is ok to stop all together.  Plan to wean off of opioids This plan should start within one week post op of your joint replacement. Maintain the same interval or time between taking each dose and first decrease the dose.  Cut the total daily intake of opioids by one tablet each day Next start to increase the time between doses. The last dose that should be eliminated is the evening dose.          Follow-up Information     [name], MD Follow up in 1 week(s).   Specialty: Orthopedic Surgery Contact information: [address] [place] [address] [phone]                  Signed: [name] 11/15/2021, 2:39 PM

## 2021-11-15 NOTE — Plan of Care (Signed)
No acute events overnight.    Problem: Health Behavior/Discharge Planning: Goal: Ability to manage health-related needs will improve Outcome: Progressing   Problem: Clinical Measurements: Goal: Ability to maintain clinical measurements within normal limits will improve Outcome: Progressing Goal: Will remain free from infection Outcome: Progressing Goal: Diagnostic test results will improve Outcome: Progressing Goal: Respiratory complications will improve Outcome: Progressing Goal: Cardiovascular complication will be avoided Outcome: Progressing   Problem: Activity: Goal: Risk for activity intolerance will decrease Outcome: Progressing   Problem: Nutrition: Goal: Adequate nutrition will be maintained Outcome: Progressing   Problem: Coping: Goal: Level of anxiety will decrease Outcome: Progressing   Problem: Elimination: Goal: Will not experience complications related to bowel motility Outcome: Progressing Goal: Will not experience complications related to urinary retention Outcome: Progressing   Problem: Pain Managment: Goal: General experience of comfort will improve Outcome: Progressing   Problem: Safety: Goal: Ability to remain free from injury will improve Outcome: Progressing   Problem: Skin Integrity: Goal: Risk for impaired skin integrity will decrease Outcome: Progressing   Problem: Education: Goal: Knowledge of the prescribed therapeutic regimen will improve Outcome: Progressing Goal: Ability to verbalize activity precautions or restrictions will improve Outcome: Progressing Goal: Understanding of discharge needs will improve Outcome: Progressing   Problem: Activity: Goal: Ability to perform//tolerate increased activity and mobilize with assistive devices will improve Outcome: Progressing   Problem: Clinical Measurements: Goal: Postoperative complications will be avoided or minimized Outcome: Progressing   Problem: Self-Care: Goal: Ability to  meet self-care needs will improve Outcome: Progressing   Problem: Self-Concept: Goal: Ability to maintain and perform role responsibilities to the fullest extent possible will improve Outcome: Progressing   Problem: Pain Management: Goal: Pain level will decrease with appropriate interventions Outcome: Progressing   Problem: Skin Integrity: Goal: Demonstration of wound healing without infection will improve Outcome: Progressing

## 2021-11-15 NOTE — Progress Notes (Signed)
Physical Therapy Treatment Patient Details Name: Valerie Le MRN: [id number] DOB: 1971-05-06 Today's Date: 11/15/2021   History of Present Illness Pt adm 5/26 with osteomyelitis and abscess of residual rt foot. 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  PMH - HTN, DM, obesity, osteomyelitis of the right foot, rt ray amputation, rt transmet amputation    PT Comments    Pt supine in bed on entry, eager to get out of bed with therapy. Pt continues to be limited in safe mobility by anxiety about falling, however as mobility progressed pt with more confidence about her abilities. Pt currently min guard for bed mobility and min Ax2 for transfers and ambulation to provide close chair follow. Pt has strong desire to get back to PLOF and work. PT will continue to follow acutely.    Recommendations for follow up therapy are one component of a multi-disciplinary discharge planning process, led by the attending physician.  Recommendations may be updated based on patient status, additional functional criteria and insurance authorization.  Follow Up Recommendations  Acute inpatient rehab (3hours/day)     Assistance Recommended at Discharge Intermittent Supervision/Assistance  Patient can return home with the following A little help with walking and/or transfers;A little help with bathing/dressing/bathroom;Assistance with cooking/housework;Assist for transportation;Help with stairs or ramp for entrance   Equipment Recommendations  Rolling walker (2 wheels);Wheelchair (measurements PT);Wheelchair cushion (measurements PT)    Recommendations for Other Services Rehab consult     Precautions / Restrictions Precautions Precautions: Fall Precaution Comments: wound vac Restrictions Weight Bearing Restrictions: Yes RLE Weight Bearing: Non weight bearing     Mobility  Bed Mobility Overal bed mobility: Needs Assistance Bed Mobility: Supine to Sit     Supine to sit: Min guard, HOB elevated      General bed mobility comments: Incr time and effort    Transfers Overall transfer level: Needs assistance Equipment used: Rolling walker (2 wheels) Transfers: Sit to/from Stand, Bed to chair/wheelchair/BSC Sit to Stand: +2 physical assistance, Min assist   Step pivot transfers: +2 physical assistance, Min assist       General transfer comment: Patient able to stand at times with min assist of 1, multiple sit<>stands during session including from low toilet seat    Ambulation/Gait Ambulation/Gait assistance: Min assist, +2 physical assistance Gait Distance (Feet): 10 Feet (+5, +5, +3, +10) Assistive device: Rolling walker (2 wheels) Gait Pattern/deviations: Step-to pattern, Trunk flexed Gait velocity: slowed Gait velocity interpretation: <1.31 ft/sec, indicative of household ambulator   General Gait Details: min Ax2 with close chair follow due to anxiousness about falling. multimodal cuing for proximity to RW and upright posture for mechanical advantage          Balance Overall balance assessment: Needs assistance Sitting-balance support: No upper extremity supported, Feet supported Sitting balance-Leahy Scale: Normal     Standing balance support: Bilateral upper extremity supported, Reliant on assistive device for balance Standing balance-Leahy Scale: Poor Standing balance comment: reliant on RW or sink for support                            Cognition Arousal/Alertness: Awake/alert Behavior During Therapy: Anxious Overall Cognitive Status: Within Functional Limits for tasks assessed                                 General Comments: continues to demonstrate fear of falling but improved confidence as treatment progressed  General Comments General comments (skin integrity, edema, etc.): VSS on RA      Pertinent Vitals/Pain Pain Assessment Pain Assessment: Faces Faces Pain Scale: Hurts a little bit Pain Location: rt residual  limb Pain Descriptors / Indicators: Discomfort, Dull Pain Intervention(s): Limited activity within patient's tolerance, Monitored during session, Repositioned     PT Goals (current goals can now be found in the care plan section) Acute Rehab PT Goals PT Goal Formulation: With patient Time For Goal Achievement: 11/26/21 Potential to Achieve Goals: Good Progress towards PT goals: Progressing toward goals    Frequency    Min 4X/week      PT Plan Current plan remains appropriate    Co-evaluation PT/OT/SLP Co-Evaluation/Treatment: Yes Reason for Co-Treatment: For patient/therapist safety PT goals addressed during session: Mobility/safety with mobility OT goals addressed during session: ADL's and self-care      AM-PAC PT "6 Clicks" Mobility   Outcome Measure  Help needed turning from your back to your side while in a flat bed without using bedrails?: A Little Help needed moving from lying on your back to sitting on the side of a flat bed without using bedrails?: A Little Help needed moving to and from a bed to a chair (including a wheelchair)?: Total Help needed standing up from a chair using your arms (e.g., wheelchair or bedside chair)?: Total Help needed to walk in hospital room?: Total Help needed climbing 3-5 steps with a railing? : Total 6 Click Score: 10    End of Session Equipment Utilized During Treatment: Gait belt;Other (comment) (limb protector falls off in standing so not utilized) Activity Tolerance: Patient tolerated treatment well Patient left: in chair;with call bell/phone within reach;with chair alarm set Nurse Communication: Mobility status PT Visit Diagnosis: Unsteadiness on feet (R26.81);Other abnormalities of gait and mobility (R26.89);Difficulty in walking, not elsewhere classified (R26.2);Pain Pain - Right/Left: Left Pain - part of body: Leg     Time: 0905-1000 PT Time Calculation (min) (ACUTE ONLY): 55 min  Charges:  $Gait Training: 23-37  mins                     Michial Disney B. Beverely Risen PT, DPT Acute Rehabilitation Services Please use secure chat or  Call Office [phone]    Elon Alas [facility] 11/15/2021, 11:27 AM

## 2021-11-15 NOTE — Progress Notes (Signed)
Inpatient Rehab Admissions Coordinator:    I do not have a CIR bed or insurance auth for this pt. I will continue to follow for potential admit pending insurance auth.   Sylvania Moss, MS, CCC-SLP Rehab Admissions Coordinator  [phone] (celll) [phone] (office) 

## 2021-11-15 NOTE — Discharge Summary (Signed)
Discharge Diagnoses:  Principal Problem:   Below-knee amputation of right lower extremity (HCC)   Surgeries: Procedure(s): RIGHT BELOW KNEE AMPUTATION APPLICATION OF WOUND VAC on 11/11/2021    Consultants:   Discharged Condition: Improved  Hospital Course: Valerie Le is an 51 y.o. female who was admitted 11/11/2021 with a chief complaint of abscess right foot, with a final diagnosis of Osteomyelitis Abscess Right Foot.  Patient was brought to the operating room on 11/11/2021 and underwent Procedure(s): RIGHT BELOW KNEE AMPUTATION APPLICATION OF WOUND VAC.    Patient was given perioperative antibiotics:  Anti-infectives (From admission, onward)    Start     Dose/Rate Route Frequency Ordered Stop   11/11/21 1700  ceFAZolin (ANCEF) IVPB 2g/100 mL premix        2 g 200 mL/hr over 30 Minutes Intravenous Every 8 hours 11/11/21 1334 11/12/21 0918   11/11/21 0700  ceFAZolin (ANCEF) IVPB 3g/100 mL premix        3 g 200 mL/hr over 30 Minutes Intravenous On call to O.R. 11/11/21 1610 11/11/21 0920     .  Patient was given sequential compression devices, early ambulation, and aspirin for DVT prophylaxis.  Recent vital signs: Patient Vitals for the past 24 hrs:  BP Temp Temp src Pulse Resp SpO2  11/15/21 0422 130/70 -- -- (!) 110 16 94 %  11/14/21 2020 133/71 99.4 F (37.4 C) Oral 100 17 94 %  11/14/21 1700 137/76 98 F (36.7 C) Oral 95 18 93 %  11/14/21 0729 134/71 99.2 F (37.3 C) Oral 93 20 92 %  .  Recent laboratory studies: No results found.  Discharge Medications:   Allergies as of 11/15/2021       Reactions   Albiglutide Itching   Itching site reaction with burning, itching and pain   Cherry Anaphylaxis   Coconut (cocos Nucifera) Anaphylaxis   Pineapple Anaphylaxis   Tomato Anaphylaxis   Glimepiride Other (See Comments)   Numbness and abdominal pain   Latex Hives   Metformin And Related Other (See Comments)   Constipation/cramps   Glipizide Diarrhea         Medication List     TAKE these medications    amitriptyline 25 MG tablet Commonly known as: ELAVIL Take 25 mg by mouth at bedtime.   amLODipine 10 MG tablet Commonly known as: NORVASC Take 10 mg by mouth at bedtime.   ammonium lactate 12 % cream Commonly known as: AMLACTIN Apply 1 application topically daily as needed for dry skin (adter showers).   aspirin EC 81 MG tablet Take 81 mg by mouth at bedtime.   atorvastatin 80 MG tablet Commonly known as: LIPITOR Take 80 mg by mouth at bedtime.   Baqsimi One Pack 3 MG/DOSE Powd Generic drug: Glucagon Place 3 mg into the nose as needed (for blood sugar 60 or below).   budesonide 180 MCG/ACT inhaler Commonly known as: PULMICORT Inhale 1 puff into the lungs 3 (three) times daily as needed (shortness of breath).   D3-1000 25 MCG (1000 UT) capsule Generic drug: Cholecalciferol Take 1,000 Units by mouth at bedtime.   Dexcom G6 Receiver Devi change EVERY 90 days   Dexcom G6 Sensor Misc use to check blood sugar change every 10 days   Dexcom G6 Transmitter Misc Dispense and use as directed   doxycycline 100 MG tablet Commonly known as: VIBRA-TABS Take 1 tablet (100 mg total) by mouth 2 (two) times daily.   FeroSul 325 (65 FE) MG tablet Generic  drug: ferrous sulfate Take 1 tablet (325 mg total) by mouth 2 (two) times daily.   Fish Oil 1000 MG Caps Take 1,000 mg by mouth at bedtime.   gabapentin 300 MG capsule Commonly known as: NEURONTIN Take 1 capsule (300 mg total) by mouth at bedtime. 3 times a day when necessary neuropathy pain- prn What changed: additional instructions   ibuprofen 200 MG tablet Commonly known as: ADVIL Take 600 mg by mouth every 6 (six) hours as needed for mild pain.   insulin pump Soln Inject into the skin continuous. 200 units every three days   L-Arginine 1000 MG Tabs Take 1,000 mg by mouth daily.   lisinopril 40 MG tablet Commonly known as: ZESTRIL Take 40 mg by mouth at  bedtime.   metoCLOPramide 10 MG tablet Commonly known as: REGLAN Take 10 mg by mouth every 6 (six) hours as needed for nausea.   nitroGLYCERIN 0.2 mg/hr patch Commonly known as: NITRODUR - Dosed in mg/24 hr Place 1 patch (0.2 mg total) onto the skin daily.   ondansetron 4 MG tablet Commonly known as: ZOFRAN Take 1 tablet (4 mg total) by mouth every 6 (six) hours as needed for nausea.   oxyCODONE-acetaminophen 5-325 MG tablet Commonly known as: PERCOCET/ROXICET Take 1 tablet by mouth every 6 (six) hours as needed.   sodium chloride 0.65 % nasal spray Commonly known as: OCEAN Place 1-2 sprays into the nose as needed for congestion.   SUMAtriptan 25 MG tablet Commonly known as: IMITREX Take 25 mg by mouth every 2 (two) hours as needed for migraine. May repeat in 2 hours if headache persists or recurs.   Toujeo Max SoloStar 300 UNIT/ML Solostar Pen Generic drug: insulin glargine (2 Unit Dial) Inject 300 Units into the skin once a week.   triamcinolone 0.025 % cream Commonly known as: KENALOG 1 application. daily as needed (rash).   Trulicity 0.75 MG/0.5ML Sopn Generic drug: Dulaglutide Inject 0.75 mg into the skin every Sunday.   Unifine Pentips Plus 31G X 5 MM Misc Generic drug: Insulin Pen Needle use 1 pen needle 5 times per day   varenicline 1 MG tablet Commonly known as: CHANTIX Take 1 mg by mouth at bedtime.   vitamin C 500 MG tablet Commonly known as: ASCORBIC ACID Take 500 mg by mouth at bedtime.        Diagnostic Studies: DG Chest 2 View  Result Date: 11/01/2021 CLINICAL DATA:  Chest pain, shortness of breath EXAM: CHEST - 2 VIEW COMPARISON:  None Available. FINDINGS: Lungs are clear.  No pleural effusion or pneumothorax. The heart is normal in size. Visualized osseous structures are within normal limits. IMPRESSION: Normal chest radiographs. Electronically Signed   By: Charline BillsSriyesh  Krishnan M.D.   On: 11/01/2021 21:30   MR FOOT RIGHT WO CONTRAST  Result  Date: 11/02/2021 CLINICAL DATA:  Diabetic with right foot swelling. History of partial amputation for osteomyelitis. Transmetatarsal amputation 12/22/2020 with revision 06/29/2021. EXAM: MRI OF THE RIGHT FOREFOOT WITHOUT CONTRAST TECHNIQUE: Multiplanar, multisequence MR imaging of the right foot was performed. No intravenous contrast was administered. COMPARISON:  MRI 12/03/2020. Radiographs 11/02/2021, 05/03/2021 and 12/02/2020. FINDINGS: Bones/Joint/Cartilage Examination includes most of the remaining foot status post transmetatarsal amputation. Compared with the previous MRI, there is new subchondral marrow T2 hyperintensity and decreased T1 signal within the base of the 5th metatarsal and the adjacent cuboid. No gross cortical destruction. There is mild marrow T2 hyperintensity within the remaining base of the 5th metatarsal and distally within the  remaining 4th metatarsal. No significant abnormalities and the additional metatarsal bases or tarsal bones. No significant joint effusions. Ligaments Intact Lisfranc ligament. Muscles and Tendons Generalized muscular atrophy. Mild T2 hyperintensity surrounding the flexor tendons in the proximal forefoot. No significant focal intramuscular fluid collections. Soft tissues Plantar soft tissue ulceration laterally along the base of the 5th metatarsal with underlying emphysema, decreased T1 signal and heterogeneous T2 hyperintensity in the surrounding soft tissues. These soft tissue changes abut the 5th tarsometatarsal articulation, such that the underlying marrow changes are at least moderately suspicious for osteomyelitis. No drainable soft tissue abscess. IMPRESSION: 1. Soft tissue ulceration along the plantar base of the 5th metatarsal with underlying soft tissue emphysema and inflammatory changes consistent with soft tissue infection. No drainable abscess identified. 2. Marrow changes in the 5th metatarsal base and adjacent cuboid have developed from the previous MRI  of 11 months ago, and given proximity to the adjacent soft tissue findings could reflect osteomyelitis. No cortical destruction or significant joint effusions. 3. Previous transmetatarsal amputation through the forefoot with mild nonspecific marrow T2 hyperintensity distally in the remaining 4th and 5th metatarsals. Electronically Signed   By: Carey Bullocks M.D.   On: 11/02/2021 08:37   NM Myocar Multi W/Spect Izetta Dakin Motion / EF  Result Date: 11/03/2021 CLINICAL DATA:  Chest pain. Intermediate risk coronary artery disease. EXAM: MYOCARDIAL IMAGING WITH SPECT (REST AND PHARMACOLOGIC-STRESS) GATED LEFT VENTRICULAR WALL MOTION STUDY LEFT VENTRICULAR EJECTION FRACTION TECHNIQUE: Standard myocardial SPECT imaging was performed after resting intravenous injection of 10.4 mCi Tc-15m tetrofosmin. Subsequently, intravenous infusion of Lexiscan was performed under the supervision of the Cardiology staff. At peak effect of the drug, 31.4 mCi Tc-35m tetrofosmin was injected intravenously and standard myocardial SPECT imaging was performed. Quantitative gated imaging was also performed to evaluate left ventricular wall motion, and estimate left ventricular ejection fraction. COMPARISON:  None Available. FINDINGS: Perfusion: No decreased activity in the left ventricle on stress imaging to suggest reversible ischemia or infarction. Wall Motion: Normal left ventricular wall motion. No left ventricular dilation. Left Ventricular Ejection Fraction: 74 % End diastolic volume 99 ml End systolic volume 25 ml IMPRESSION: 1. No reversible ischemia or infarction. 2. Normal left ventricular wall motion. 3. Left ventricular ejection fraction 74% 4. Non invasive risk stratification*: Low *2012 Appropriate Use Criteria for Coronary Revascularization Focused Update: J Am Coll Cardiol. 2012;59(9):857-881. http://content.dementiazones.com.aspx?articleid=[email or web address] Electronically Signed   By: Danae Orleans M.D.   On: 11/03/2021 14:30   DG  CHEST PORT 1 VIEW  Result Date: 11/04/2021 CLINICAL DATA:  Hypoxia. EXAM: PORTABLE CHEST 1 VIEW COMPARISON:  Nov 01, 2021. FINDINGS: The heart size and mediastinal contours are within normal limits. Both lungs are clear. The visualized skeletal structures are unremarkable. IMPRESSION: No active disease. Electronically Signed   By: Lupita Raider M.D.   On: 11/04/2021 11:13   DG Foot Complete Right  Result Date: 11/02/2021 CLINICAL DATA:  foot infection on plantar surface, prior amputation for osteo, eval signs of new osteo EXAM: RIGHT FOOT COMPLETE - 3+ VIEW COMPARISON:  05/03/2021 FINDINGS: Previous transmetatarsal amputation. Interval cortical loss involving the proximal shaft of the fifth metatarsal. There has been some subtle cortical loss at the amputation margin of the fourth metatarsal shaft. There is regional soft tissue swelling. There is plantar subcutaneous gas at the level of the midfoot. IMPRESSION: Findings consistent with fourth and fifth metatarsal osteomyelitis. Electronically Signed   By: Corlis Leak M.D.   On: 11/02/2021 05:29   ECHOCARDIOGRAM COMPLETE  Result  Date: 11/02/2021    ECHOCARDIOGRAM REPORT   Patient Name:   CYANNE DELMAR GASKIN-FISCHER Date of Exam: 11/02/2021 Medical Rec #:  [id number]              Height:       71.0 in Accession #:    [id number]             Weight:       265.0 lb Date of Birth:  Sep 26, 1970             BSA:          2.376 m Patient Age:    50 years               BP:           120/71 mmHg Patient Gender: F                      HR:           82 bpm. Exam Location:  Inpatient Procedure: 2D Echo, Color Doppler and Cardiac Doppler Indications:    Elevated troponin  History:        Patient has no prior history of Echocardiogram examinations.                 Signs/Symptoms:Chest Pain; Risk Factors:Diabetes, Hypertension,                 Current Smoker and HLD.  Sonographer:    Rodrigo Ran RCS Referring Phys: [phone] RONDELL A SMITH IMPRESSIONS  1. Left ventricular  ejection fraction, by estimation, is 60 to 65%. The left ventricle has normal function. The left ventricle has no regional wall motion abnormalities. Left ventricular diastolic parameters were normal.  2. Right ventricular systolic function is normal. The right ventricular size is normal.  3. The mitral valve is normal in structure. Trivial mitral valve regurgitation.  4. The aortic valve is normal in structure. Aortic valve regurgitation is not visualized. Aortic valve sclerosis is present, with no evidence of aortic valve stenosis. FINDINGS  Left Ventricle: Left ventricular ejection fraction, by estimation, is 60 to 65%. The left ventricle has normal function. The left ventricle has no regional wall motion abnormalities. The left ventricular internal cavity size was normal in size. There is  no left ventricular hypertrophy. Left ventricular diastolic parameters were normal. Right Ventricle: The right ventricular size is normal. No increase in right ventricular wall thickness. Right ventricular systolic function is normal. Left Atrium: Left atrial size was normal in size. Right Atrium: Right atrial size was normal in size. Pericardium: There is no evidence of pericardial effusion. Mitral Valve: The mitral valve is normal in structure. Trivial mitral valve regurgitation. Tricuspid Valve: The tricuspid valve is normal in structure. Tricuspid valve regurgitation is not demonstrated. No evidence of tricuspid stenosis. Aortic Valve: The aortic valve is normal in structure. Aortic valve regurgitation is not visualized. Aortic valve sclerosis is present, with no evidence of aortic valve stenosis. Aortic valve mean gradient measures 9.0 mmHg. Aortic valve peak gradient measures 19.4 mmHg. Aortic valve area, by VTI measures 2.14 cm. Pulmonic Valve: The pulmonic valve was normal in structure. Pulmonic valve regurgitation is not visualized. No evidence of pulmonic stenosis. Aorta: The aortic root is normal in size and  structure. IAS/Shunts: The interatrial septum was not assessed.  LEFT VENTRICLE PLAX 2D LVIDd:         5.10 cm   Diastology LVIDs:  3.10 cm   LV e' medial:    8.49 cm/s LV PW:         0.90 cm   LV E/e' medial:  10.5 LV IVS:        0.60 cm   LV e' lateral:   13.80 cm/s LVOT diam:     2.00 cm   LV E/e' lateral: 6.5 LV SV:         88 LV SV Index:   37 LVOT Area:     3.14 cm  RIGHT VENTRICLE             IVC RV S prime:     20.70 cm/s  IVC diam: 1.70 cm TAPSE (M-mode): 2.3 cm LEFT ATRIUM           Index LA diam:      3.00 cm 1.26 cm/m LA Vol (A4C): 39.9 ml 16.79 ml/m  AORTIC VALVE                     PULMONIC VALVE AV Area (Vmax):    1.74 cm      PV Vmax:       0.96 m/s AV Area (Vmean):   2.18 cm      PV Peak grad:  3.7 mmHg AV Area (VTI):     2.14 cm AV Vmax:           220.00 cm/s AV Vmean:          138.000 cm/s AV VTI:            0.412 m AV Peak Grad:      19.4 mmHg AV Mean Grad:      9.0 mmHg LVOT Vmax:         122.00 cm/s LVOT Vmean:        95.600 cm/s LVOT VTI:          0.281 m LVOT/AV VTI ratio: 0.68  AORTA Ao Root diam: 2.60 cm Ao Asc diam:  2.90 cm MITRAL VALVE MV Area (PHT): 6.83 cm    SHUNTS MV Decel Time: 111 msec    Systemic VTI:  0.28 m MV E velocity: 89.10 cm/s  Systemic Diam: 2.00 cm Truett Mainland MD Electronically signed by Truett Mainland MD Signature Date/Time: 11/02/2021/3:22:00 PM    Final     Patient benefited maximally from their hospital stay and there were no complications.     Disposition: Discharge disposition: 02-Transferred to [facility]      Discharge Instructions     Call MD / Call 911   Complete by: As directed    If you experience chest pain or shortness of breath, CALL 911 and be transported to the hospital emergency room.  If you develope a fever above 101 F, pus (white drainage) or increased drainage or redness at the wound, or calf pain, call your surgeon's office.   Constipation Prevention   Complete by: As directed    Drink plenty of  fluids.  Prune juice may be helpful.  You may use a stool softener, such as Colace (over the counter) 100 mg twice a day.  Use MiraLax (over the counter) for constipation as needed.   Diet - low sodium heart healthy   Complete by: As directed    Increase activity slowly as tolerated   Complete by: As directed    Negative Pressure Wound Therapy - Incisional   Complete by: As directed    Post-operative opioid taper instructions:   Complete  by: As directed    POST-OPERATIVE OPIOID TAPER INSTRUCTIONS: It is important to wean off of your opioid medication as soon as possible. If you do not need pain medication after your surgery it is ok to stop day one. Opioids include: Codeine, Hydrocodone(Norco, Vicodin), Oxycodone(Percocet, oxycontin) and hydromorphone amongst others.  Long term and even short term use of opiods can cause: Increased pain response Dependence Constipation Depression Respiratory depression And more.  Withdrawal symptoms can include Flu like symptoms Nausea, vomiting And more Techniques to manage these symptoms Hydrate well Eat regular healthy meals Stay active Use relaxation techniques(deep breathing, meditating, yoga) Do Not substitute Alcohol to help with tapering If you have been on opioids for less than two weeks and do not have pain than it is ok to stop all together.  Plan to wean off of opioids This plan should start within one week post op of your joint replacement. Maintain the same interval or time between taking each dose and first decrease the dose.  Cut the total daily intake of opioids by one tablet each day Next start to increase the time between doses. The last dose that should be eliminated is the evening dose.          Follow-up Information     [name], MD Follow up in 1 week(s).   Specialty: Orthopedic Surgery Contact information: [address] [place] [address] [phone]                  Signed: [name] 11/15/2021, 7:09 AM

## 2021-11-15 NOTE — Progress Notes (Signed)
Occupational Therapy Treatment Patient Details Name: Valerie BalloonBuffi L Gaskin-Fischer MRN: [id number] DOB: [birth year] Today's Date: 11/15/2021   History of present illness Pt adm 5/26 with osteomyelitis and abscess of residual rt foot. 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  PMH - HTN, DM, obesity, osteomyelitis of the right foot, rt ray amputation, rt transmet amputation   OT comments  Patient received in supine and able to get to EOB with HOB raised. Patient ambulated to bathroom with 2 seated rest breaks with min assist +2 due to patient safety and increase patient confidence due to recent fall. Patient able to transfer to regular toilet with use of RW and rail and was able to perform hygiene seated. Patient ambulated to sink in bathroom with +2 assist for safety and was able to perform grooming tasks with performing own setup seated and standing to brush teeth, wash face, and clean glasses with use of RW and sink for support and min guard assist +2 for safety.  Patient performed bathing and dressing seated with setup. Patient is making gains and demonstrating decrease fear of falling as treatment progressed. Patient would benefit from further OT services to increase independence and safety with functional transfers and self care.    Recommendations for follow up therapy are one component of a multi-disciplinary discharge planning process, led by the attending physician.  Recommendations may be updated based on patient status, additional functional criteria and insurance authorization.    Follow Up Recommendations  Acute inpatient rehab (3hours/day)    Assistance Recommended at Discharge Intermittent Supervision/Assistance  Patient can return home with the following  A lot of help with bathing/dressing/bathroom;A lot of help with walking and/or transfers;Assist for transportation;Help with stairs or ramp for entrance;Assistance with cooking/housework   Equipment Recommendations  BSC/3in1;Tub/shower bench     Recommendations for Other Services      Precautions / Restrictions Precautions Precautions: Fall Precaution Comments: wound vac Restrictions Weight Bearing Restrictions: Yes RLE Weight Bearing: Non weight bearing       Mobility Bed Mobility Overal bed mobility: Needs Assistance Bed Mobility: Supine to Sit     Supine to sit: Min guard, HOB elevated     General bed mobility comments: Incr time and effort    Transfers Overall transfer level: Needs assistance Equipment used: Rolling walker (2 wheels) Transfers: Sit to/from Stand, Bed to chair/wheelchair/BSC Sit to Stand: +2 physical assistance, Min assist     Step pivot transfers: +2 physical assistance, Min assist     General transfer comment: Patient able to stand at times with min assist of 1     Balance Overall balance assessment: Needs assistance Sitting-balance support: No upper extremity supported, Feet supported Sitting balance-Leahy Scale: Normal     Standing balance support: Bilateral upper extremity supported, Reliant on assistive device for balance Standing balance-Leahy Scale: Poor Standing balance comment: reliant on RW or sink for support                           ADL either performed or assessed with clinical judgement   ADL Overall ADL's : Needs assistance/impaired     Grooming: Wash/dry hands;Wash/dry face;Sitting;Oral care;Min guard;Standing Grooming Details (indicate cue type and reason): patient performed setup for grooming seated and stood to perform with use of sink and RW for support with balance.  Min guard +2 for safety and recent fall Upper Body Bathing: Set up;Sitting   Lower Body Bathing: Minimal assistance;Sitting/lateral leans Lower Body Bathing Details (indicate cue type  and reason): performed seated at sink Upper Body Dressing : Set up;Sitting Upper Body Dressing Details (indicate cue type and reason): changed gowns     Toilet Transfer: Minimal assistance;+2 for  physical assistance;+2 for safety/equipment;Grab bars;Rolling walker (2 wheels);Regular Teacher, adult education Details (indicate cue type and reason): ambulated to bathroom from EOB with 2 seated breaks before reaching toilet.  Plus 2 assist for safety and patient comfort due to recent fall. Toileting- Clothing Manipulation and Hygiene: Sitting/lateral lean;Moderate assistance Toileting - Clothing Manipulation Details (indicate cue type and reason): performed toilet hygiene seated on toilet. mod assist for clothing management     Functional mobility during ADLs: Minimal assistance;+2 for physical assistance;+2 for safety/equipment General ADL Comments: continues to have fear of falling. required 2 seated rest breaks before getting to toilet due to fatigue and rise to step into bathroom    Extremity/Trunk Assessment              Vision       Perception     Praxis      Cognition Arousal/Alertness: Awake/alert Behavior During Therapy: Anxious Overall Cognitive Status: Within Functional Limits for tasks assessed                                 General Comments: continues to demonstrate fear of falling but improved confidence as treatment progressed        Exercises      Shoulder Instructions       General Comments      Pertinent Vitals/ Pain       Pain Assessment Pain Assessment: Faces Faces Pain Scale: Hurts a little bit Pain Location: rt residual limb Pain Descriptors / Indicators: Discomfort, Dull Pain Intervention(s): Monitored during session  Home Living                                          Prior Functioning/Environment              Frequency  Min 2X/week        Progress Toward Goals  OT Goals(current goals can now be found in the care plan section)  Progress towards OT goals: Progressing toward goals  Acute Rehab OT Goals Patient Stated Goal: get better OT Goal Formulation: With patient Time For Goal  Achievement: 11/26/21 Potential to Achieve Goals: Good ADL Goals Pt Will Perform Lower Body Bathing: with mod assist;with min assist;sitting/lateral leans Pt Will Perform Lower Body Dressing: with mod assist;with min assist;sitting/lateral leans Pt Will Transfer to Toilet: with min assist;with min guard assist;stand pivot transfer;ambulating;bedside commode;grab bars Pt Will Perform Toileting - Clothing Manipulation and hygiene: with mod assist;with min assist;sit to/from stand Additional ADL Goal #1: Pt will correctly don L limb guard in prep for ADL mobility/transfers  Plan Discharge plan remains appropriate    Co-evaluation    PT/OT/SLP Co-Evaluation/Treatment: Yes Reason for Co-Treatment: For patient/therapist safety;To address functional/ADL transfers   OT goals addressed during session: ADL's and self-care      AM-PAC OT "6 Clicks" Daily Activity     Outcome Measure   Help from another person eating meals?: None Help from another person taking care of personal grooming?: A Little Help from another person toileting, which includes using toliet, bedpan, or urinal?: A Lot Help from another person bathing (including washing, rinsing, drying)?: A Lot Help  from another person to put on and taking off regular upper body clothing?: A Little Help from another person to put on and taking off regular lower body clothing?: A Lot 6 Click Score: 16    End of Session Equipment Utilized During Treatment: Gait belt;Rolling walker (2 wheels)  OT Visit Diagnosis: Unsteadiness on feet (R26.81);Other abnormalities of gait and mobility (R26.89);Pain Pain - Right/Left: Left Pain - part of body: Leg   Activity Tolerance Patient tolerated treatment well   Patient Left in chair;with call bell/phone within reach;with chair alarm set   Nurse Communication Mobility status        Time: 4128-7867 OT Time Calculation (min): 54 min  Charges: OT General Charges $OT Visit: 1 Visit OT  Treatments $Self Care/Home Management : 23-37 mins  Alfonse Flavors, OTA Acute Rehabilitation Services  Pager [phone] Office [phone]   Dewain Penning 11/15/2021, 10:09 AM

## 2021-11-16 ENCOUNTER — Encounter (HOSPITAL_COMMUNITY): Payer: Self-pay | Admitting: Physical Medicine and Rehabilitation

## 2021-11-16 ENCOUNTER — Other Ambulatory Visit: Payer: Self-pay

## 2021-11-16 ENCOUNTER — Inpatient Hospital Stay (HOSPITAL_COMMUNITY)
Admission: RE | Admit: 2021-11-16 | Discharge: 2021-11-25 | DRG: 945 | Disposition: A | Discharge status: Home / Self Care | Payer: Managed Care, Other (non HMO) | Source: Intra-hospital | Attending: Physical Medicine and Rehabilitation | Admitting: Physical Medicine and Rehabilitation

## 2021-11-16 DIAGNOSIS — R5381 Other malaise: Secondary | ICD-10-CM | POA: Diagnosis present

## 2021-11-16 DIAGNOSIS — D72829 Elevated white blood cell count, unspecified: Secondary | ICD-10-CM | POA: Diagnosis not present

## 2021-11-16 DIAGNOSIS — G43909 Migraine, unspecified, not intractable, without status migrainosus: Secondary | ICD-10-CM | POA: Diagnosis present

## 2021-11-16 DIAGNOSIS — F32A Depression, unspecified: Secondary | ICD-10-CM | POA: Diagnosis present

## 2021-11-16 DIAGNOSIS — D62 Acute posthemorrhagic anemia: Secondary | ICD-10-CM | POA: Diagnosis present

## 2021-11-16 DIAGNOSIS — Z79899 Other long term (current) drug therapy: Secondary | ICD-10-CM

## 2021-11-16 DIAGNOSIS — Z91018 Allergy to other foods: Secondary | ICD-10-CM

## 2021-11-16 DIAGNOSIS — Z9641 Presence of insulin pump (external) (internal): Secondary | ICD-10-CM | POA: Diagnosis present

## 2021-11-16 DIAGNOSIS — M25561 Pain in right knee: Secondary | ICD-10-CM | POA: Diagnosis not present

## 2021-11-16 DIAGNOSIS — E109 Type 1 diabetes mellitus without complications: Secondary | ICD-10-CM | POA: Diagnosis present

## 2021-11-16 DIAGNOSIS — Z89511 Acquired absence of right leg below knee: Secondary | ICD-10-CM | POA: Diagnosis not present

## 2021-11-16 DIAGNOSIS — K120 Recurrent oral aphthae: Secondary | ICD-10-CM | POA: Diagnosis present

## 2021-11-16 DIAGNOSIS — I10 Essential (primary) hypertension: Secondary | ICD-10-CM | POA: Diagnosis present

## 2021-11-16 DIAGNOSIS — Z794 Long term (current) use of insulin: Secondary | ICD-10-CM

## 2021-11-16 DIAGNOSIS — Z6836 Body mass index (BMI) 36.0-36.9, adult: Secondary | ICD-10-CM | POA: Diagnosis not present

## 2021-11-16 DIAGNOSIS — F17210 Nicotine dependence, cigarettes, uncomplicated: Secondary | ICD-10-CM | POA: Diagnosis present

## 2021-11-16 DIAGNOSIS — Z7982 Long term (current) use of aspirin: Secondary | ICD-10-CM | POA: Diagnosis not present

## 2021-11-16 DIAGNOSIS — K5900 Constipation, unspecified: Secondary | ICD-10-CM | POA: Diagnosis present

## 2021-11-16 DIAGNOSIS — R4701 Aphasia: Secondary | ICD-10-CM | POA: Diagnosis present

## 2021-11-16 DIAGNOSIS — J45909 Unspecified asthma, uncomplicated: Secondary | ICD-10-CM | POA: Diagnosis present

## 2021-11-16 DIAGNOSIS — Z7951 Long term (current) use of inhaled steroids: Secondary | ICD-10-CM | POA: Diagnosis not present

## 2021-11-16 DIAGNOSIS — Z91040 Latex allergy status: Secondary | ICD-10-CM

## 2021-11-16 DIAGNOSIS — E669 Obesity, unspecified: Secondary | ICD-10-CM | POA: Diagnosis present

## 2021-11-16 DIAGNOSIS — R7401 Elevation of levels of liver transaminase levels: Secondary | ICD-10-CM | POA: Diagnosis not present

## 2021-11-16 DIAGNOSIS — E1069 Type 1 diabetes mellitus with other specified complication: Secondary | ICD-10-CM

## 2021-11-16 DIAGNOSIS — E7800 Pure hypercholesterolemia, unspecified: Secondary | ICD-10-CM | POA: Diagnosis present

## 2021-11-16 DIAGNOSIS — D509 Iron deficiency anemia, unspecified: Secondary | ICD-10-CM | POA: Diagnosis present

## 2021-11-16 DIAGNOSIS — D500 Iron deficiency anemia secondary to blood loss (chronic): Secondary | ICD-10-CM | POA: Diagnosis not present

## 2021-11-16 LAB — GLUCOSE, CAPILLARY
Glucose-Capillary: 140 mg/dL — ABNORMAL HIGH (ref 70–99)
Glucose-Capillary: 138 mg/dL — ABNORMAL HIGH (ref 70–99)
Glucose-Capillary: 157 mg/dL — ABNORMAL HIGH (ref 70–99)
Glucose-Capillary: 173 mg/dL — ABNORMAL HIGH (ref 70–99)

## 2021-11-16 MED ORDER — SUMATRIPTAN SUCCINATE 25 MG PO TABS: 25.0000 mg | ORAL_TABLET | ORAL | Status: DC | PRN

## 2021-11-16 MED ORDER — LISINOPRIL 20 MG PO TABS
40.0000 mg | ORAL_TABLET | Freq: Every day | ORAL | Status: DC
  Administered 2021-11-16 – 2021-11-24 (×9): 40 mg via ORAL
  Filled 2021-11-16 (×9): qty 2

## 2021-11-16 MED ORDER — DULAGLUTIDE 0.75 MG/0.5ML ~~LOC~~ SOAJ
0.7500 mg | SUBCUTANEOUS | Status: DC
Start: 2021-11-20 — End: 2021-11-25

## 2021-11-16 MED ORDER — GABAPENTIN 300 MG PO CAPS
300.0000 mg | ORAL_CAPSULE | Freq: Three times a day (TID) | ORAL | Status: DC
  Administered 2021-11-16 – 2021-11-25 (×26): 300 mg via ORAL
  Filled 2021-11-16 (×26): qty 1

## 2021-11-16 MED ORDER — JUVEN PO PACK
1.0000 | PACK | Freq: Two times a day (BID) | ORAL | Status: DC
  Administered 2021-11-17 – 2021-11-25 (×17): 1 via ORAL
  Filled 2021-11-16 (×13): qty 1

## 2021-11-16 MED ORDER — MAGNESIUM HYDROXIDE 400 MG/5ML PO SUSP
30.0000 mL | Freq: Every day | ORAL | Status: DC | PRN
Start: 2021-11-16 — End: 2021-11-25
  Administered 2021-11-19: 30 mL via ORAL
  Filled 2021-11-16 (×3): qty 30

## 2021-11-16 MED ORDER — PROCHLORPERAZINE EDISYLATE 10 MG/2ML IJ SOLN: 5.0000 mg | Freq: Four times a day (QID) | INTRAMUSCULAR | Status: DC | PRN

## 2021-11-16 MED ORDER — MAGNESIUM HYDROXIDE 400 MG/5ML PO SUSP: 30.0000 mL | ORAL | Status: DC

## 2021-11-16 MED ORDER — ATORVASTATIN CALCIUM 80 MG PO TABS
80.0000 mg | ORAL_TABLET | Freq: Every day | ORAL | Status: DC
  Administered 2021-11-16 – 2021-11-24 (×9): 80 mg via ORAL
  Filled 2021-11-16 (×9): qty 1

## 2021-11-16 MED ORDER — FLEET ENEMA 7-19 GM/118ML RE ENEM
1.0000 | ENEMA | Freq: Once | RECTAL | Status: DC | PRN
Start: 2021-11-16 — End: 2021-11-25

## 2021-11-16 MED ORDER — FERROUS SULFATE 325 (65 FE) MG PO TABS
325.0000 mg | ORAL_TABLET | Freq: Two times a day (BID) | ORAL | Status: DC
  Administered 2021-11-16 – 2021-11-25 (×18): 325 mg via ORAL
  Filled 2021-11-16 (×18): qty 1

## 2021-11-16 MED ORDER — MAGNESIUM HYDROXIDE 400 MG/5ML PO SUSP
30.0000 mL | ORAL | Status: AC
  Administered 2021-11-16: 30 mL via ORAL
  Filled 2021-11-16 (×2): qty 30

## 2021-11-16 MED ORDER — INSULIN PUMP
Freq: Three times a day (TID) | SUBCUTANEOUS | Status: DC
  Administered 2021-11-18: 0.1 via SUBCUTANEOUS
  Administered 2021-11-18: 4.9 via SUBCUTANEOUS
  Administered 2021-11-19: 6.95 via SUBCUTANEOUS
  Administered 2021-11-20: 0.7 via SUBCUTANEOUS
  Administered 2021-11-20: 1.35 via SUBCUTANEOUS
  Administered 2021-11-21: 13.6 via SUBCUTANEOUS
  Administered 2021-11-22: 0.15 via SUBCUTANEOUS
  Filled 2021-11-16 (×2): qty 1

## 2021-11-16 MED ORDER — TRAZODONE HCL 50 MG PO TABS: 25.0000 mg | ORAL_TABLET | Freq: Every evening | ORAL | Status: DC | PRN

## 2021-11-16 MED ORDER — DIPHENHYDRAMINE HCL 12.5 MG/5ML PO ELIX: 12.5000 mg | ORAL_SOLUTION | Freq: Four times a day (QID) | ORAL | Status: DC | PRN

## 2021-11-16 MED ORDER — ACETAMINOPHEN 325 MG PO TABS: 325.0000 mg | ORAL_TABLET | ORAL | Status: DC | PRN

## 2021-11-16 MED ORDER — PROCHLORPERAZINE MALEATE 5 MG PO TABS: 5.0000 mg | ORAL_TABLET | Freq: Four times a day (QID) | ORAL | Status: DC | PRN

## 2021-11-16 MED ORDER — AMLODIPINE BESYLATE 10 MG PO TABS
10.0000 mg | ORAL_TABLET | Freq: Every day | ORAL | Status: DC
  Administered 2021-11-16 – 2021-11-22 (×7): 10 mg via ORAL
  Filled 2021-11-16 (×7): qty 1

## 2021-11-16 MED ORDER — ZINC SULFATE 220 (50 ZN) MG PO CAPS
220.0000 mg | ORAL_CAPSULE | Freq: Every day | ORAL | Status: AC
  Administered 2021-11-17 – 2021-11-24 (×8): 220 mg via ORAL
  Filled 2021-11-16 (×8): qty 1

## 2021-11-16 MED ORDER — METHOCARBAMOL 500 MG PO TABS
500.0000 mg | ORAL_TABLET | Freq: Four times a day (QID) | ORAL | Status: DC | PRN
Start: 2021-11-16 — End: 2021-11-25
  Administered 2021-11-18: 500 mg via ORAL
  Filled 2021-11-16 (×3): qty 1

## 2021-11-16 MED ORDER — VARENICLINE TARTRATE 1 MG PO TABS
1.0000 mg | ORAL_TABLET | Freq: Every day | ORAL | Status: DC
  Administered 2021-11-16 – 2021-11-24 (×9): 1 mg via ORAL
  Filled 2021-11-16 (×9): qty 1

## 2021-11-16 MED ORDER — ENOXAPARIN SODIUM 40 MG/0.4ML IJ SOSY
40.0000 mg | PREFILLED_SYRINGE | INTRAMUSCULAR | Status: DC
  Administered 2021-11-16 – 2021-11-24 (×9): 40 mg via SUBCUTANEOUS
  Filled 2021-11-16 (×9): qty 0.4

## 2021-11-16 MED ORDER — OXYCODONE HCL 5 MG PO TABS
5.0000 mg | ORAL_TABLET | ORAL | Status: DC | PRN
  Administered 2021-11-16: 5 mg via ORAL
  Administered 2021-11-17 – 2021-11-18 (×3): 10 mg via ORAL
  Administered 2021-11-18: 5 mg via ORAL
  Administered 2021-11-20: 10 mg via ORAL
  Filled 2021-11-16: qty 1
  Filled 2021-11-16 (×3): qty 2
  Filled 2021-11-16: qty 1
  Filled 2021-11-16: qty 2

## 2021-11-16 MED ORDER — AMITRIPTYLINE HCL 50 MG PO TABS
25.0000 mg | ORAL_TABLET | Freq: Every day | ORAL | Status: DC
  Administered 2021-11-16 – 2021-11-24 (×9): 25 mg via ORAL
  Filled 2021-11-16 (×9): qty 1

## 2021-11-16 MED ORDER — GUAIFENESIN-DM 100-10 MG/5ML PO SYRP: 5.0000 mL | ORAL_SOLUTION | Freq: Four times a day (QID) | ORAL | Status: DC | PRN

## 2021-11-16 MED ORDER — ALUM & MAG HYDROXIDE-SIMETH 200-200-20 MG/5ML PO SUSP: 30.0000 mL | ORAL | Status: DC | PRN

## 2021-11-16 MED ORDER — BISACODYL 5 MG PO TBEC
5.0000 mg | DELAYED_RELEASE_TABLET | Freq: Every day | ORAL | Status: DC | PRN
Start: 2021-11-16 — End: 2021-11-25
  Administered 2021-11-18: 5 mg via ORAL
  Filled 2021-11-16 (×2): qty 1

## 2021-11-16 MED ORDER — PANTOPRAZOLE SODIUM 40 MG PO TBEC
40.0000 mg | DELAYED_RELEASE_TABLET | Freq: Every day | ORAL | Status: DC
  Administered 2021-11-17 – 2021-11-25 (×9): 40 mg via ORAL
  Filled 2021-11-16 (×9): qty 1

## 2021-11-16 MED ORDER — ASPIRIN 81 MG PO TBEC
81.0000 mg | DELAYED_RELEASE_TABLET | Freq: Every day | ORAL | Status: DC
  Administered 2021-11-17 – 2021-11-25 (×9): 81 mg via ORAL
  Filled 2021-11-16 (×9): qty 1

## 2021-11-16 MED ORDER — ASCORBIC ACID 500 MG PO TABS
1000.0000 mg | ORAL_TABLET | Freq: Every day | ORAL | Status: DC
  Administered 2021-11-17 – 2021-11-25 (×9): 1000 mg via ORAL
  Filled 2021-11-16 (×9): qty 2

## 2021-11-16 MED ORDER — PROCHLORPERAZINE 25 MG RE SUPP
12.5000 mg | Freq: Four times a day (QID) | RECTAL | Status: DC | PRN
Start: 2021-11-16 — End: 2021-11-25

## 2021-11-16 NOTE — Progress Notes (Signed)
Inpatient Rehab Admissions Coordinator:    I do not have insurance auth or a CIR bed for this Pt. This AM.  I will continue to follow for potential admit pending insurance auth.   Clemens Catholic, [place], [facility] Admissions Coordinator  [phone] ([place]) [phone] (office)

## 2021-11-16 NOTE — Discharge Instructions (Addendum)
Inpatient Rehab Discharge Instructions  Valerie Le Discharge date and time: 11/25/2021  Activities/Precautions/ Functional Status: Activity: no lifting, driving, or strenuous exercise until cleared by MD Diet: diabetic diet Wound Care: keep wound clean and dry Functional status:  ___ No restrictions     ___ Walk up steps independently ___ 24/7 supervision/assistance   ___ Walk up steps with assistance __x_ Intermittent supervision/assistance  ___ Bathe/dress independently ___ Walk with walker     ___ Bathe/dress with assistance ___ Walk Independently    ___ Shower independently ___ Walk with assistance    __x_ Shower with assistance _x__ No alcohol     ___ Return to work/school ________  Special Instructions:  No driving, alcohol consumption or tobacco use.    COMMUNITY REFERRALS UPON DISCHARGE:    Home Health:   PT  & OT                 Agency:[facility] Phone:[phone]   Medical Equipment/Items Ordered: WHEELCHAIR AND DROP-ARM BEDSIDE COMMODE  WILL GET ROLLING WALKER ON OWN                                                 Agency/Supplier:[facility]   [phone]    My questions have been answered and I understand these instructions. I will adhere to these goals and the provided educational materials after my discharge from the hospital.  Patient/Caregiver Signature _______________________________ Date __________  Clinician Signature _______________________________________ Date __________  Please bring this form and your medication list with you to all your follow-up doctor's appointments.

## 2021-11-16 NOTE — H&P (Incomplete)
Physical Medicine and Rehabilitation Admission H&P    CC: Debility secondary to right below the knee amputation  HPI: Valerie Le is a 51 year old female with a history of diabetes mellitus type 1 status post right transmetatarsal amputation recently evaluated by Dr. Lajoyce Cornersuda for ongoing chronic draining wound.  She was receiving oral Augmentin.  She presented to the emergency department on 11/02/2021 with complaints of intermittent chest pain.  This was accompanied by nausea and vomiting.  She denied fever or shortness of breath.  Laboratory work-up revealed downtrending troponin, leukocytosis.  EKG without significant ischemic changes.  Chest pain resolved with aspirin and nitroglycerin.  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  She was admitted and placed on Rocephin and itraconazole.  MRI of the right foot revealed soft tissue ulceration around the base of the fifth metatarsal with soft tissue emphysema and inflammatory changes consistent with soft tissue infection.  Orthopedic surgery consulted. Review of Systems  Constitutional:  Negative for chills and fever.  HENT:  Positive for congestion. Negative for sore throat.   Eyes:  Negative for blurred vision and double vision.  Respiratory:  Positive for cough. Negative for sputum production.        Has been slowing stopping cigarettes and was down to one cigarette/day PTA  Cardiovascular:  Negative for chest pain and palpitations.  Gastrointestinal:  Negative for abdominal pain, nausea and vomiting.  Genitourinary:  Negative for dysuria and hematuria.  Musculoskeletal:  Negative for back pain, myalgias and neck pain.  Neurological:  Negative for dizziness and headaches.  Psychiatric/Behavioral:  Negative for depression, memory loss and suicidal ideas.   Past Medical History:  Diagnosis Date   Anemia    Asthma    as a child   Depression    Diabetes mellitus without  complication (HCC)    Type 1 - Has Insulin Pump   Headache    migraines   High cholesterol    Hypertension    Past Surgical History:  Procedure Laterality Date   AMPUTATION Right 12/22/2020   Procedure: RIGHT TRANSMETATARSAL AMPUTATION;  Surgeon: Nadara Mustarduda, Marcus V, MD;  [facility];  Service: Orthopedics;  Laterality: Right;   AMPUTATION Right 06/29/2021   Procedure: REVISION RIGHT TRANSMETATARSAL AMPUTATION;  Surgeon: Nadara Mustarduda, Marcus V, MD;  [facility];  Service: Orthopedics;  Laterality: Right;   AMPUTATION Right 11/11/2021   Procedure: RIGHT BELOW KNEE AMPUTATION;  Surgeon: Nadara Mustarduda, Marcus V, MD;  [facility];  Service: Orthopedics;  Laterality: Right;   AMPUTATION TOE Right 12/04/2020   Procedure: AMPUTATION RIGHT SECOND TOE, I&D FOOT;  Surgeon: Eldred MangesYates, Mark C, MD;  Location: [facility] ORS;  Service: Orthopedics;  Laterality: Right;  Right second toe amputation, I&D right foot.   APPLICATION OF WOUND VAC  11/11/2021   Procedure: APPLICATION OF WOUND VAC;  Surgeon: Nadara Mustarduda, Marcus V, MD;  Location: [facility] OR;  Service: Orthopedics;;   birth control implant  07/31/2006   Essure Implant   TONSILLECTOMY     TYMPANOSTOMY TUBE PLACEMENT     WISDOM TOOTH EXTRACTION     History reviewed. No pertinent family history. Social History:  reports that she has been smoking cigarettes. She has a 6.25 pack-year smoking history. She has never used smokeless tobacco. She reports that she does not currently use alcohol. She reports that she does not currently use drugs. Allergies:  Allergies  Allergen Reactions   Albiglutide Itching    Itching site reaction with burning, itching and pain  Cherry Anaphylaxis   Coconut (Cocos Nucifera) Anaphylaxis   Pineapple Anaphylaxis   Tomato Anaphylaxis   Glimepiride Other (See Comments)    Numbness and abdominal pain   Latex Hives   Metformin And Related Other (See Comments)    Constipation/cramps   Glipizide Diarrhea   Medications Prior to Admission  Medication  Sig Dispense Refill   amitriptyline (ELAVIL) 25 MG tablet Take 25 mg by mouth at bedtime.     amLODipine (NORVASC) 10 MG tablet Take 10 mg by mouth at bedtime.     ammonium lactate (AMLACTIN) 12 % cream Apply 1 application topically daily as needed for dry skin (adter showers).     aspirin 81 MG EC tablet Take 81 mg by mouth at bedtime.     atorvastatin (LIPITOR) 80 MG tablet Take 80 mg by mouth at bedtime.     budesonide (PULMICORT) 180 MCG/ACT inhaler Inhale 1 puff into the lungs 3 (three) times daily as needed (shortness of breath).     D3-1000 25 MCG (1000 UT) capsule Take 1,000 Units by mouth at bedtime.     doxycycline (VIBRA-TABS) 100 MG tablet Take 1 tablet (100 mg total) by mouth 2 (two) times daily. 20 tablet 0   ferrous sulfate 325 (65 FE) MG tablet Take 1 tablet (325 mg total) by mouth 2 (two) times daily. 60 tablet 3   gabapentin (NEURONTIN) 300 MG capsule Take 1 capsule (300 mg total) by mouth at bedtime. 3 times a day when necessary neuropathy pain- prn (Patient taking differently: Take 300 mg by mouth at bedtime.) 90 capsule 3   ibuprofen (ADVIL,MOTRIN) 200 MG tablet Take 600 mg by mouth every 6 (six) hours as needed for mild pain.     Insulin Human (INSULIN PUMP) SOLN Inject into the skin continuous. 200 units every three days     L-Arginine 1000 MG TABS Take 1,000 mg by mouth daily.     lisinopril (ZESTRIL) 40 MG tablet Take 40 mg by mouth at bedtime.     metoCLOPramide (REGLAN) 10 MG tablet Take 10 mg by mouth every 6 (six) hours as needed for nausea.     nitroGLYCERIN (NITRODUR - DOSED IN MG/24 HR) 0.2 mg/hr patch Place 1 patch (0.2 mg total) onto the skin daily. 30 patch 3   Omega-3 Fatty Acids (FISH OIL) 1000 MG CAPS Take 1,000 mg by mouth at bedtime.     ondansetron (ZOFRAN) 4 MG tablet Take 1 tablet (4 mg total) by mouth every 6 (six) hours as needed for nausea. 10 tablet 0   oxyCODONE-acetaminophen (PERCOCET/ROXICET) 5-325 MG tablet Take 1 tablet by mouth every 6 (six)  hours as needed. 20 tablet 0   sodium chloride (OCEAN) 0.65 % nasal spray Place 1-2 sprays into the nose as needed for congestion.     SUMAtriptan (IMITREX) 25 MG tablet Take 25 mg by mouth every 2 (two) hours as needed for migraine. May repeat in 2 hours if headache persists or recurs.     TOUJEO MAX SOLOSTAR 300 UNIT/ML Solostar Pen Inject 300 Units into the skin once a week.     triamcinolone (KENALOG) 0.025 % cream 1 application. daily as needed (rash).     TRULICITY 0.75 MG/0.5ML SOPN Inject 0.75 mg into the skin every Sunday.     varenicline (CHANTIX) 1 MG tablet Take 1 mg by mouth at bedtime.     vitamin C (ASCORBIC ACID) 500 MG tablet Take 500 mg by mouth at bedtime.     Continuous  Blood Gluc Receiver (DEXCOM G6 RECEIVER) DEVI change EVERY 90 days     Continuous Blood Gluc Sensor (DEXCOM G6 SENSOR) MISC use to check blood sugar change every 10 days     Continuous Blood Gluc Transmit (DEXCOM G6 TRANSMITTER) MISC Dispense and use as directed     Glucagon (BAQSIMI ONE PACK) 3 MG/DOSE POWD Place 3 mg into the nose as needed (for blood sugar 60 or below).     UNIFINE PENTIPS PLUS 31G X 5 MM MISC use 1 pen needle 5 times per day        Home: Home Living Family/patient expects to be discharged to:: Private residence Living Arrangements: Spouse/significant other Available Help at Discharge: Family, Available 24 hours/day Type of Home: Apartment Home Access: Stairs to enter [employer] of Steps: 3 Entrance Stairs-Rails: Right, Left, Can reach both Home Layout: One level Bathroom Shower/Tub: Engineer, manufacturing systems: Standard Home Equipment: [employer] - quad Additional Comments: will have assistance of teenage children if needed   Functional History: Prior Function Prior Level of Function : Independent/Modified Independent, Driving, Working/employed Mobility Comments: Uses cane at times 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  Functional Status:   Mobility: Bed Mobility Overal bed mobility: Needs Assistance Bed Mobility: Supine to Sit Supine to sit: Min guard, HOB elevated General bed mobility comments: Pt up on commode upon arrival. Transfers Overall transfer level: Needs assistance Equipment used: Rolling walker (2 wheels) Transfers: Sit to/from Stand Sit to Stand: Min assist Bed to/from chair/wheelchair/BSC transfer type:: Stand pivot Stand pivot transfers: Min assist Step pivot transfers: +2 physical assistance, Min assist General transfer comment: Cues to scoot to edge of commode and push up with L hand off arm rest, minA to power up to stand and steady. Ambulation/Gait Ambulation/Gait assistance: Min assist Gait Distance (Feet): 8 Feet Assistive device: Rolling walker (2 wheels) Gait Pattern/deviations: Step-to pattern, Trunk flexed General Gait Details: MinA to steady, especially when turning or hopping posteriorly. Pt mildly anxious as she began to fatigue while standing. Gait velocity: slowed Gait velocity interpretation: <1.31 ft/sec, indicative of household ambulator    ADL: ADL Overall ADL's : Needs assistance/impaired Eating/Feeding: Independent Grooming: Wash/dry hands, Wash/dry face, Sitting, Oral care, Min guard, Standing Grooming Details (indicate cue type and reason): patient performed setup for grooming seated and stood to perform with use of sink and RW for support with balance.  Min guard +2 for safety and recent fall Upper Body Bathing: Set up, Sitting Lower Body Bathing: Minimal assistance, Sitting/lateral leans Lower Body Bathing Details (indicate cue type and reason): performed seated at sink Upper Body Dressing : Set up, Sitting Upper Body Dressing Details (indicate cue type and reason): changed gowns Lower Body Dressing: Maximal assistance Toilet Transfer: Minimal assistance, +2 for physical assistance, +2 for safety/equipment, Grab bars, Rolling walker (2 wheels), Regular Toilet Toilet  Transfer Details (indicate cue type and reason): ambulated to bathroom from EOB with 2 seated breaks before reaching toilet.  Plus 2 assist for safety and patient comfort due to recent fall. Toileting- Clothing Manipulation and Hygiene: Sitting/lateral lean, Moderate assistance Toileting - Clothing Manipulation Details (indicate cue type and reason): performed toilet hygiene seated on toilet. mod assist for clothing management Functional mobility during ADLs: Minimal assistance, +2 for physical assistance, +2 for safety/equipment General ADL Comments: continues to have fear of falling. required 2 seated rest breaks before getting to toilet due to fatigue and rise to step into bathroom  Cognition: Cognition Overall Cognitive Status: Within Functional Limits  for tasks assessed Orientation Level: Oriented X4 Cognition Arousal/Alertness: Awake/alert Behavior During Therapy: Anxious Overall Cognitive Status: Within Functional Limits for tasks assessed General Comments: Mildly anxious as time in standing progressed as she reported feeling fatigued and unsteady.  Physical Exam: Blood pressure 107/71, pulse 98, temperature 98.6 F (37 C), temperature source Oral, resp. rate 18, height 5\' 11"  (1.803 m), weight 123.4 kg, last menstrual period 10/04/2021, SpO2 96 %. Physical Exam Constitutional:      Appearance: Normal appearance.  HENT:     Head: Normocephalic and atraumatic.  Eyes:     Extraocular Movements: Extraocular movements intact.     Pupils: Pupils are equal, round, and reactive to light.  Cardiovascular:     Rate and Rhythm: Normal rate and regular rhythm.  Pulmonary:     Effort: Pulmonary effort is normal.     Breath sounds: Normal breath sounds.  Abdominal:     General: Bowel sounds are normal.     Palpations: Abdomen is soft.  Musculoskeletal:     Comments: Right incision wound VAC in place with good seal; no output in canister. Post-op pain in good control  Skin:    General:  Skin is warm and dry.  Neurological:     General: No focal deficit present.     Mental Status: She is alert and oriented to person, place, and time.  Psychiatric:        Mood and Affect: Mood normal.        Behavior: Behavior normal.    Results for orders placed or performed during the hospital encounter of 11/11/21 (from the past 48 hour(s))  Glucose, capillary     Status: Abnormal   Collection Time: 11/14/21  5:25 PM  Result Value Ref Range   Glucose-Capillary 120 (H) 70 - 99 mg/dL    Comment: Glucose reference range applies only to samples taken after fasting for at least 8 hours.  Glucose, capillary     Status: Abnormal   Collection Time: 11/14/21  9:12 PM  Result Value Ref Range   Glucose-Capillary 113 (H) 70 - 99 mg/dL    Comment: Glucose reference range applies only to samples taken after fasting for at least 8 hours.  Glucose, capillary     Status: Abnormal   Collection Time: 11/15/21  7:27 AM  Result Value Ref Range   Glucose-Capillary 163 (H) 70 - 99 mg/dL    Comment: Glucose reference range applies only to samples taken after fasting for at least 8 hours.  Glucose, capillary     Status: Abnormal   Collection Time: 11/15/21 11:47 AM  Result Value Ref Range   Glucose-Capillary 238 (H) 70 - 99 mg/dL    Comment: Glucose reference range applies only to samples taken after fasting for at least 8 hours.   Comment 1 Notify RN   Glucose, capillary     Status: Abnormal   Collection Time: 11/15/21  4:04 PM  Result Value Ref Range   Glucose-Capillary 202 (H) 70 - 99 mg/dL    Comment: Glucose reference range applies only to samples taken after fasting for at least 8 hours.   Comment 1 Notify RN   Glucose, capillary     Status: Abnormal   Collection Time: 11/15/21  8:07 PM  Result Value Ref Range   Glucose-Capillary 161 (H) 70 - 99 mg/dL    Comment: Glucose reference range applies only to samples taken after fasting for at least 8 hours.  Glucose, capillary     Status:  Abnormal    Collection Time: 11/16/21  8:18 AM  Result Value Ref Range   Glucose-Capillary 140 (H) 70 - 99 mg/dL    Comment: Glucose reference range applies only to samples taken after fasting for at least 8 hours.  Glucose, capillary     Status: Abnormal   Collection Time: 11/16/21 11:25 AM  Result Value Ref Range   Glucose-Capillary 138 (H) 70 - 99 mg/dL    Comment: Glucose reference range applies only to samples taken after fasting for at least 8 hours.   No results found.    Blood pressure 107/71, pulse 98, temperature 98.6 F (37 C), temperature source Oral, resp. rate 18, height  (1.803 m), weight 123.4 kg, last menstrual period 10/04/2021, SpO2 96 %.  Medical Problem List and Plan: 1. Functional deficits secondary to right below the knee amputation  -patient may *** shower  -ELOS/Goals: *** 2.  Antithrombotics: -DVT/anticoagulation:  Pharmaceutical: Lovenox  -antiplatelet therapy: Aspirin 81 mg 3. Pain Management: Tylenol and oxycodone as needed, Neurontin 300 mg 3 times daily, amitriptyline 25 mg at bedtime>>migraine prevention 4. Mood: LCSW to evaluate and provide emotional support  -antipsychotic agents: n/a 5. Neuropsych: This patient is capable of making decisions on her own behalf. 6. Skin/Wound Care: Routine skin care checks  -- Plan VAC dressing removal Friday 6/2 7. Fluids/Electrolytes/Nutrition: Routine I's and O's and follow-up chemistries 8: DM on insulin pump: continue current orders 9: Hypertension: Continue amlodipine and lisinopril 10: Hyperlipidemia: continue Lipitor 12: Iron deficiency anemia: continue ferous sulfate and vitamin C 13: Tobacco use: continue Chantix at bedtime 14: History of migraine headache: none recently; Elavil at night for prevention 15: Constipation: give MoM now; other prn 16: Leukocytosis: afebrile; monitor, follow-up CBC ***  Milinda Antis, PA-C 11/16/2021

## 2021-11-16 NOTE — Progress Notes (Addendum)
PMR Admission Coordinator Pre-Admission Assessment   Patient: Valerie Le is an 51 y.o., female MRN: [id number] DOB: 1971-01-29 Height: _0  (180.3 cm) Weight: 123.4 kg   Insurance Information HMO: yes     PPO:      PCP:      IPA:      80/20:      OTHER:  PRIMARY: [employer] Managed      Policy#: [id number]      Subscriber: Pt CM Name: Valerie Le       Phone#: [phone] x [phone]     Fax#: [phone] Pre-Cert#: [id number]    Approved 5/31-11/22/21 with update due 11/22/21   Employer:  Benefits:  Phone #:      Name:  [name] Date: 12/17/2020- 12/16/2021 Deductible: $2,000 ($2,000 met) OOP Max: $4,000 ($4,000 met) CIR: 80% coverage, 20% co-insurance SNF: 80% coverage, 20% co-insurance; limited to 60 days/cal yr Outpatient: $50 copay/visit Home Health:  80% coverage, 20% co-insurance DME: 100% coverage, auth required Development worker, community:       Phone#:  Providers: in network  SECONDARY:       Policy#:      Phone#:    The Engineer, petroleum" for patients in Inpatient Rehabilitation Facilities with attached "Privacy Act [place] Records" was provided and verbally reviewed with: Patient   Emergency Contact Information Contact Information       Name Relation Home Work Mobile    Valerie Le Spouse [phone]   [phone]           Current Medical History  Patient Admitting Diagnosis: R BKA History of Present Illness: Patient is a 51 year old woman who is seen in follow-up for abscess and osteomyelitis right foot.  She is status post transmetatarsal amputation. 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  Pt. Seen by OT and PT who recommended CIR to assist return to PLOF.    Patient's medical record from [facility] has been reviewed by the rehabilitation admission coordinator and physician.   Past Medical History      Past Medical History:  Diagnosis Date    Anemia     Asthma      as a child   Depression     Diabetes mellitus without complication ([place])      Type 1 - Has Insulin Pump   Headache      migraines   High cholesterol     Hypertension        Has the patient had major surgery during 100 days prior to admission? Yes   Family History   family history is not on file.   Current Medications   Current Facility-Administered Medications:    0.9 %  sodium chloride infusion, , Intravenous, Continuous, Newt Minion, MD, Stopped at 11/12/21 0919   acetaminophen (TYLENOL) tablet 325-650 mg, 325-650 mg, Oral, Q6H PRN, Newt Minion, MD   alum & mag hydroxide-simeth (MAALOX/MYLANTA) 200-200-20 MG/5ML suspension 15-30 mL, 15-30 mL, Oral, Q2H PRN, Newt Minion, MD   amitriptyline (ELAVIL) tablet 25 mg, 25 mg, Oral, QHS, Newt Minion, MD, 25 mg at 11/15/21 2113   amLODipine (NORVASC) tablet 10 mg, 10 mg, Oral, QHS, Newt Minion, MD, 10 mg at 11/15/21 2113   ascorbic acid (VITAMIN C) tablet 1,000 mg, 1,000 mg, Oral, Daily, Newt Minion, MD, 1,000 mg at 11/16/21 3903   aspirin EC tablet 81 mg, 81 mg, Oral, Daily, Newt Minion,  MD, 81 mg at 11/16/21 0904   atorvastatin (LIPITOR) tablet 80 mg, 80 mg, Oral, QHS, Newt Minion, MD, 80 mg at 11/15/21 2113   bisacodyl (DULCOLAX) EC tablet 5 mg, 5 mg, Oral, Daily PRN, Newt Minion, MD, 5 mg at 11/15/21 0848   docusate sodium (COLACE) capsule 100 mg, 100 mg, Oral, Daily, Newt Minion, MD, 100 mg at 11/16/21 0086   Dulaglutide SOPN 0.75 mg, 0.75 mg, Subcutaneous, Q Sun, [name], Marcus V, MD   ferrous sulfate tablet 325 mg, 325 mg, Oral, BID WC, Newt Minion, MD, 325 mg at 11/16/21 7619   gabapentin (NEURONTIN) capsule 300 mg, 300 mg, Oral, TID, Newt Minion, MD, 300 mg at 11/16/21 0905   guaiFENesin-dextromethorphan (ROBITUSSIN DM) 100-10 MG/5ML syrup 15 mL, 15 mL, Oral, Q4H PRN, Newt Minion, MD   hydrALAZINE (APRESOLINE) injection 5 mg, 5 mg, Intravenous, Q20 Min PRN, Newt Minion,  MD   HYDROmorphone (DILAUDID) injection 0.5-1 mg, 0.5-1 mg, Intravenous, Q4H PRN, Newt Minion, MD, 1 mg at 11/15/21 0432   HYDROmorphone (DILAUDID) tablet 2-3 mg, 2-3 mg, Oral, Q4H PRN, Newt Minion, MD, 2 mg at 11/12/21 1638   insulin pump, , Subcutaneous, TID WC, HS, 0200, Newt Minion, MD, Given at 11/16/21 1132   labetalol (NORMODYNE) injection 10 mg, 10 mg, Intravenous, Q10 min PRN, Newt Minion, MD   lisinopril (ZESTRIL) tablet 40 mg, 40 mg, Oral, QHS, Newt Minion, MD, 40 mg at 11/15/21 2113   magnesium sulfate IVPB 2 g 50 mL, 2 g, Intravenous, Daily PRN, Newt Minion, MD   metoprolol tartrate (LOPRESSOR) injection 2-5 mg, 2-5 mg, Intravenous, Q2H PRN, Newt Minion, MD   nutrition supplement (JUVEN) (JUVEN) powder packet 1 packet, 1 packet, Oral, BID BM, Newt Minion, MD, 1 packet at 11/16/21 0905   ondansetron [facility]) injection 4 mg, 4 mg, Intravenous, Q6H PRN, Newt Minion, MD, 4 mg at 11/15/21 1557   oxyCODONE (Oxy IR/ROXICODONE) immediate release tablet 5-10 mg, 5-10 mg, Oral, Q4H PRN, Newt Minion, MD, 10 mg at 11/16/21 0905   pantoprazole (PROTONIX) EC tablet 40 mg, 40 mg, Oral, Daily, Newt Minion, MD, 40 mg at 11/16/21 0904   phenol (CHLORASEPTIC) mouth spray 1 spray, 1 spray, Mouth/Throat, PRN, Newt Minion, MD   polyethylene glycol (MIRALAX / GLYCOLAX) packet 17 g, 17 g, Oral, Daily PRN, Newt Minion, MD, 17 g at 11/15/21 1209   potassium chloride SA (KLOR-CON M) CR tablet 20-40 mEq, 20-40 mEq, Oral, Daily PRN, Newt Minion, MD   varenicline (CHANTIX) tablet 1 mg, 1 mg, Oral, QHS, Newt Minion, MD, 1 mg at 11/15/21 2113   zinc sulfate capsule 220 mg, 220 mg, Oral, Daily, Newt Minion, MD, 220 mg at 11/16/21 5093   Patients Current Diet:  Diet Order                  Diet - low sodium heart healthy             Diet Carb Modified Fluid consistency: Thin; Room service appropriate? Yes  Diet effective now                          Precautions / Restrictions Precautions Precautions: Fall Precaution Comments: wound vac Restrictions Weight Bearing Restrictions: Yes RLE Weight Bearing: Non weight bearing LLE Weight Bearing: Weight bearing as tolerated    Has the patient  had 2 or more falls or a fall with injury in the past year? No   Prior Activity Level Community (5-7x/wk): pt. active in the community PTA   Prior Functional Level Self Care: Did the patient need help bathing, dressing, using the toilet or eating? Independent   Indoor Mobility: Did the patient need assistance with walking from room to room (with or without device)? Independent   Stairs: Did the patient need assistance with internal or external stairs (with or without device)? Independent   Functional Cognition: Did the patient need help planning regular tasks such as shopping or remembering to take medications? Independent   Patient Information Are you of Hispanic, Latino/a,or Spanish origin?: A. No, not of Hispanic, Latino/a, or Spanish origin What is your race?: B. Black or African American Do you need or want an interpreter to communicate with a doctor or health care staff?: 0. No   Patient's Response To:  Health Literacy and Transportation Is the patient able to respond to health literacy and transportation needs?: Yes Health Literacy - How often do you need to have someone help you when you read instructions, pamphlets, or other written material from your doctor or pharmacy?: Never In the past 12 months, has lack of transportation kept you from medical appointments or from getting medications?: No In the past 12 months, has lack of transportation kept you from meetings, work, or from getting things needed for daily living?: No   Home Assistive Devices / [place] Devices/Equipment: Eyeglasses, Radio producer (specify quad or straight), CBG Meter Home Equipment: Cane - quad   Prior Device Use: Indicate devices/aids used by the  patient prior to current illness, exacerbation or injury? None of the above   Current Functional Level Cognition   Overall Cognitive Status: Within Functional Limits for tasks assessed Orientation Level: Oriented X4 General Comments: Mildly anxious as time in standing progressed as she reported feeling fatigued and unsteady.    Extremity Assessment (includes Sensation/Coordination)   Upper Extremity Assessment: Overall WFL for tasks assessed  Lower Extremity Assessment: Defer to PT evaluation RLE Deficits / Details: BKA. AROM rt knee 0 -75 degrees in sitting. Strength grossly 3/5     ADLs   Overall ADL's : Needs assistance/impaired Eating/Feeding: Independent Grooming: Wash/dry hands, Wash/dry face, Sitting, Oral care, Min guard, Standing Grooming Details (indicate cue type and reason): patient performed setup for grooming seated and stood to perform with use of sink and RW for support with balance.  Min guard +2 for safety and recent fall Upper Body Bathing: Set up, Sitting Lower Body Bathing: Minimal assistance, Sitting/lateral leans Lower Body Bathing Details (indicate cue type and reason): performed seated at sink Upper Body Dressing : Set up, Sitting Upper Body Dressing Details (indicate cue type and reason): changed gowns Lower Body Dressing: Maximal assistance Toilet Transfer: Minimal assistance, +2 for physical assistance, +2 for safety/equipment, Grab bars, Rolling walker (2 wheels), Regular Toilet Toilet Transfer Details (indicate cue type and reason): ambulated to bathroom from EOB with 2 seated breaks before reaching toilet.  Plus 2 assist for safety and patient comfort due to recent fall. Toileting- Clothing Manipulation and Hygiene: Sitting/lateral lean, Moderate assistance Toileting - Clothing Manipulation Details (indicate cue type and reason): performed toilet hygiene seated on toilet. mod assist for clothing management Functional mobility during ADLs: Minimal  assistance, +2 for physical assistance, +2 for safety/equipment General ADL Comments: continues to have fear of falling. required 2 seated rest breaks before getting to toilet due to fatigue and rise  to step into bathroom     Mobility   Overal bed mobility: Needs Assistance Bed Mobility: Supine to Sit Supine to sit: Min guard, HOB elevated General bed mobility comments: Pt up on commode upon arrival.     Transfers   Overall transfer level: Needs assistance Equipment used: Rolling walker (2 wheels) Transfers: Sit to/from Stand Sit to Stand: Min assist Bed to/from chair/wheelchair/BSC transfer type:: Stand pivot Stand pivot transfers: Min assist Step pivot transfers: +2 physical assistance, Min assist General transfer comment: Cues to scoot to edge of commode and push up with L hand off arm rest, minA to power up to stand and steady.     Ambulation / Gait / Stairs / Wheelchair Mobility   Ambulation/Gait Ambulation/Gait assistance: Herbalist (Feet): 8 Feet Assistive device: Rolling walker (2 wheels) Gait Pattern/deviations: Step-to pattern, Trunk flexed General Gait Details: MinA to steady, especially when turning or hopping posteriorly. Pt mildly anxious as she began to fatigue while standing. Gait velocity: slowed Gait velocity interpretation: <1.31 ft/sec, indicative of household ambulator     Posture / Balance Balance Overall balance assessment: Needs assistance Sitting-balance support: No upper extremity supported, Feet supported Sitting balance-Leahy Scale: Normal Standing balance support: Bilateral upper extremity supported, Reliant on assistive device for balance Standing balance-Leahy Scale: Poor Standing balance comment: reliant on RW for support, min guard-minA     Special needs/care consideration Skin surgical incision    Previous Home Environment (from acute therapy documentation) Living Arrangements: Spouse/significant other Available Help at  Discharge: Family, Available 24 hours/day Type of Home: Apartment Home Layout: One level Home Access: Stairs to enter Entrance Stairs-Rails: Right, Left, Can reach both Entrance Stairs-Number of Steps: 3 Bathroom Shower/Tub: Chiropodist: Standard Home Care Services: No Additional Comments: will have assistance of teenage children if needed   Discharge Living Setting Plans for Discharge Living Setting: Patient's home Type of Home at Discharge: Apartment Discharge Home Layout: One level Discharge Home Access: Stairs to enter Entrance Stairs-Rails: Right, Can reach both Entrance Stairs-Number of Steps: 3 Discharge Bathroom Shower/Tub: Tub/shower unit Discharge Bathroom Toilet: Standard Discharge Bathroom Accessibility: Yes How Accessible: Accessible via walker Does the patient have any problems obtaining your medications?: No   Social/Family/Support Systems Patient Roles: Spouse Contact Information: [phone] Anticipated Caregiver: Junie Spencer Fischer Ability/Limitations of Caregiver: Can do min A Caregiver Availability: 24/7 Discharge Plan Discussed with Primary Caregiver: Yes Is Caregiver In Agreement with Plan?: Yes Does Caregiver/Family have Issues with Lodging/Transportation while Pt is in Rehab?: No   Goals Patient/Family Goal for Rehab: PT/OT Mod I Expected length of stay: 7-10 days Pt/Family Agrees to Admission and willing to participate: Yes Program Orientation Provided & Reviewed with Pt/Caregiver Including Roles  & Responsibilities: Yes   Decrease burden of Care through IP rehab admission: Specialzed equipment needs, Decrease number of caregivers, Bowel and bladder program, and Patient/family education   Possible need for SNF placement upon discharge: not anticipated    Patient Condition: I have reviewed medical records from [facility] , spoken with CM, and patient and family member. I met with patient at the bedside and discussed  via phone for inpatient rehabilitation assessment.  Patient will benefit from ongoing PT and OT, can actively participate in  3 hours of therapy a day 5 days of the week, and can make measurable gains during the admission.  Patient will also benefit from the coordinated team approach during an Inpatient Acute Rehabilitation admission.  The patient will receive intensive therapy  as well as Rehabilitation physician, nursing, social worker, and care management interventions.  Due to safety, skin/wound care, disease management, medication administration, pain management, and patient education the patient requires 24 hour a day rehabilitation nursing.  The patient is currently min A with mobility and basic ADLs.  Discharge setting and therapy post discharge at home with home health is anticipated.  Patient has agreed to participate in the Acute Inpatient Rehabilitation Program and will admit today.   Preadmission Screen Completed By:  Genella Mech, 11/16/2021 2:31 PM ______________________________________________________________________   Discussed status with Dr. Curlene Dolphin  on 11/16/21 at 35 and received approval for admission today.   Admission Coordinator:  Genella Mech, CCC-SLP, time 1437/Date 11/16/21    Assessment/Plan: Diagnosis: R BKA Does the need for close, 24 hr/day Medical supervision in concert with the patient's rehab needs make it unreasonable for this patient to be served in a less intensive setting? Yes Co-Morbidities requiring supervision/potential complications:Asthma, DM type 1, headache, Migraine, HTN Due to bladder management, bowel management, safety, skin/wound care, disease management, medication administration, pain management, and patient education, does the patient require 24 hr/day rehab nursing? Yes Does the patient require coordinated care of a physician, rehab nurse, PT, OT, and SLP to address physical and functional deficits in the context of the above medical  diagnosis(es)? Yes Addressing deficits in the following areas: balance, endurance, locomotion, strength, transferring, bowel/bladder control, bathing, dressing, feeding, grooming, and toileting Can the patient actively participate in an intensive therapy program of at least 3 hrs of therapy 5 days a week? Yes The potential for patient to make measurable gains while on inpatient rehab is excellent Anticipated functional outcomes upon discharge from inpatient rehab: modified independent PT, modified independent OT, n/a SLP Estimated rehab length of stay to reach the above functional goals is: 7-10 Anticipated discharge destination: Other 10. Overall Rehab/Functional Prognosis: excellent     MD Signature: Jennye Boroughs

## 2021-11-16 NOTE — Progress Notes (Addendum)
Inpatient Rehabilitation Admission Medication Review by a Pharmacist  A complete drug regimen review was completed for this patient to identify any potential clinically significant medication issues.  High Risk Drug Classes Is patient taking? Indication by Medication  Antipsychotic Yes Compazine prn N/V  Anticoagulant Yes Lovenox for VTE ppx  Antibiotic No   Opioid Yes Oxycodone prn for pain  Antiplatelet No   Hypoglycemics/insulin Yes Trulicity, insulin pump for DM  Vasoactive Medication Yes Amlodipine, lisinopril for BP  Chemotherapy No   Other Yes Elavil, gabapentin for pain Atorvastatin for HLD Pantoprazole for GERD     Type of Medication Issue Identified Description of Issue Recommendation(s)  Drug Interaction(s) (clinically significant)     Duplicate Therapy     Allergy     No Medication Administration End Date     Incorrect Dose     Additional Drug Therapy Needed     Significant med changes from prior encounter (inform family/care partners about these prior to discharge).    Other       Clinically significant medication issues were identified that warrant physician communication and completion of prescribed/recommended actions by midnight of the next day:  No  Pharmacist comments: None  Time spent performing this drug regimen review (minutes):  20 minutes   Elwin Sleight 11/16/2021 3:10 PM

## 2021-11-16 NOTE — H&P (Signed)
Physical Medicine and Rehabilitation Admission H&P     CC: Debility secondary to right below the knee amputation   HPI: Valerie Le is a 51 year old female with a history of diabetes mellitus type 1 status post right transmetatarsal amputation recently evaluated by Dr. Lajoyce Corners for ongoing chronic draining wound.  She was receiving oral Augmentin.  She presented to the emergency department on 11/02/2021 with complaints of intermittent chest pain.  This was accompanied by nausea and vomiting.  She denied fever or shortness of breath.  Laboratory work-up revealed downtrending troponin, leukocytosis.  EKG without significant ischemic changes.  Chest pain resolved with aspirin and nitroglycerin.  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  She was admitted and placed on Rocephin and itraconazole.  MRI of the right foot revealed soft tissue ulceration around the base of the fifth metatarsal with soft tissue emphysema and inflammatory changes consistent with soft tissue infection.  Orthopedic surgery consulted and she had a R BKA with application of wound vac completed on 11/11/21.  She did have a fall on 5/27 without significant injury on 5/27 when trying to get to the bathroom.  Review of Systems  Constitutional:  Negative for chills and fever.  HENT:  Positive for congestion. Negative for sore throat.  No hearing changes.  Eyes:  Negative for blurred vision and double vision.  Respiratory:  Positive for cough. Negative for sputum production.        Has been slowing stopping cigarettes and was down to one cigarette/day PTA  Cardiovascular:  Negative for chest pain and palpitations.  Gastrointestinal:  Negative for abdominal pain, nausea and vomiting.  Genitourinary:  Negative for dysuria and hematuria.  Musculoskeletal:  Negative for back pain, myalgias and neck pain.  Neurological:  Negative for dizziness and headaches.   Psychiatric/Behavioral:  Negative for depression, memory loss and suicidal ideas.       Past Medical History:  Diagnosis Date   Anemia     Asthma      as a child   Depression     Diabetes mellitus without complication (HCC)      Type 1 - Has Insulin Pump   Headache      migraines   High cholesterol     Hypertension           Past Surgical History:  Procedure Laterality Date   AMPUTATION Right 12/22/2020    Procedure: RIGHT TRANSMETATARSAL AMPUTATION;  Surgeon: [name], MD;  Location: [facility] OR;  Service: Orthopedics;  Laterality: Right;   AMPUTATION Right 06/29/2021    Procedure: REVISION RIGHT TRANSMETATARSAL AMPUTATION;  Surgeon: [name], MD;  Location: [facility] OR;  Service: Orthopedics;  Laterality: Right;   AMPUTATION Right 11/11/2021    Procedure: RIGHT BELOW KNEE AMPUTATION;  Surgeon: [name], MD;  Location: [facility] OR;  Service: Orthopedics;  Laterality: Right;   AMPUTATION TOE Right 12/04/2020    Procedure: AMPUTATION RIGHT SECOND TOE, I&D FOOT;  Surgeon: Eldred Manges, MD;  Location: [facility] ORS;  Service: Orthopedics;  Laterality: Right;  Right second toe amputation, I&D right foot.   APPLICATION OF WOUND VAC   11/11/2021    Procedure: APPLICATION OF WOUND VAC;  Surgeon: [name], MD;  Location: [facility] OR;  Service: Orthopedics;;   birth control implant   07/31/2006    Essure Implant   TONSILLECTOMY       TYMPANOSTOMY TUBE PLACEMENT       WISDOM TOOTH EXTRACTION  History reviewed. No pertinent family history. Social History:  reports that she has been smoking cigarettes. She has a 6.25 pack-year smoking history. She has never used smokeless tobacco. She reports that she does not currently use alcohol. She reports that she does not currently use drugs. Allergies:       Allergies  Allergen Reactions   Albiglutide Itching      Itching site reaction with burning, itching and pain   Cherry Anaphylaxis   Coconut (Cocos Nucifera) Anaphylaxis   Pineapple  Anaphylaxis   Tomato Anaphylaxis   Glimepiride Other (See Comments)      Numbness and abdominal pain   Latex Hives   Metformin And Related Other (See Comments)      Constipation/cramps   Glipizide Diarrhea          Medications Prior to Admission  Medication Sig Dispense Refill   amitriptyline (ELAVIL) 25 MG tablet Take 25 mg by mouth at bedtime.       amLODipine (NORVASC) 10 MG tablet Take 10 mg by mouth at bedtime.       ammonium lactate (AMLACTIN) 12 % cream Apply 1 application topically daily as needed for dry skin (adter showers).       aspirin 81 MG EC tablet Take 81 mg by mouth at bedtime.       atorvastatin (LIPITOR) 80 MG tablet Take 80 mg by mouth at bedtime.       budesonide (PULMICORT) 180 MCG/ACT inhaler Inhale 1 puff into the lungs 3 (three) times daily as needed (shortness of breath).       D3-1000 25 MCG (1000 UT) capsule Take 1,000 Units by mouth at bedtime.       doxycycline (VIBRA-TABS) 100 MG tablet Take 1 tablet (100 mg total) by mouth 2 (two) times daily. 20 tablet 0   ferrous sulfate 325 (65 FE) MG tablet Take 1 tablet (325 mg total) by mouth 2 (two) times daily. 60 tablet 3   gabapentin (NEURONTIN) 300 MG capsule Take 1 capsule (300 mg total) by mouth at bedtime. 3 times a day when necessary neuropathy pain- prn (Patient taking differently: Take 300 mg by mouth at bedtime.) 90 capsule 3   ibuprofen (ADVIL,MOTRIN) 200 MG tablet Take 600 mg by mouth every 6 (six) hours as needed for mild pain.       Insulin Human (INSULIN PUMP) SOLN Inject into the skin continuous. 200 units every three days       L-Arginine 1000 MG TABS Take 1,000 mg by mouth daily.       lisinopril (ZESTRIL) 40 MG tablet Take 40 mg by mouth at bedtime.       metoCLOPramide (REGLAN) 10 MG tablet Take 10 mg by mouth every 6 (six) hours as needed for nausea.       nitroGLYCERIN (NITRODUR - DOSED IN MG/24 HR) 0.2 mg/hr patch Place 1 patch (0.2 mg total) onto the skin daily. 30 patch 3   Omega-3 Fatty  Acids (FISH OIL) 1000 MG CAPS Take 1,000 mg by mouth at bedtime.       ondansetron (ZOFRAN) 4 MG tablet Take 1 tablet (4 mg total) by mouth every 6 (six) hours as needed for nausea. 10 tablet 0   oxyCODONE-acetaminophen (PERCOCET/ROXICET) 5-325 MG tablet Take 1 tablet by mouth every 6 (six) hours as needed. 20 tablet 0   sodium chloride (OCEAN) 0.65 % nasal spray Place 1-2 sprays into the nose as needed for congestion.       SUMAtriptan (IMITREX)  25 MG tablet Take 25 mg by mouth every 2 (two) hours as needed for migraine. May repeat in 2 hours if headache persists or recurs.       TOUJEO MAX SOLOSTAR 300 UNIT/ML Solostar Pen Inject 300 Units into the skin once a week.       triamcinolone (KENALOG) 0.025 % cream 1 application. daily as needed (rash).       TRULICITY 0.75 MG/0.5ML SOPN Inject 0.75 mg into the skin every Sunday.       varenicline (CHANTIX) 1 MG tablet Take 1 mg by mouth at bedtime.       vitamin C (ASCORBIC ACID) 500 MG tablet Take 500 mg by mouth at bedtime.       Continuous Blood Gluc Receiver (DEXCOM G6 RECEIVER) DEVI change EVERY 90 days       Continuous Blood Gluc Sensor (DEXCOM G6 SENSOR) MISC use to check blood sugar change every 10 days       Continuous Blood Gluc Transmit (DEXCOM G6 TRANSMITTER) MISC Dispense and use as directed       Glucagon (BAQSIMI ONE PACK) 3 MG/DOSE POWD Place 3 mg into the nose as needed (for blood sugar 60 or below).       UNIFINE PENTIPS PLUS 31G X 5 MM MISC use 1 pen needle 5 times per day              Home: Home Living Family/patient expects to be discharged to:: Private residence Living Arrangements: Spouse/significant other Available Help at Discharge: Family, Available 24 hours/day Type of Home: Apartment Home Access: Stairs to enter [employer] of Steps: 3 Entrance Stairs-Rails: Right, Left, Can reach both Home Layout: One level Bathroom Shower/Tub: Engineer, manufacturing systems: Standard Home Equipment: [employer] -  quad Additional Comments: will have assistance of teenage children if needed   Functional History: Prior Function Prior Level of Function : Independent/Modified Independent, Driving, Working/employed Mobility Comments: Uses cane at times 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   Functional Status:  Mobility: Bed Mobility Overal bed mobility: Needs Assistance Bed Mobility: Supine to Sit Supine to sit: Min guard, HOB elevated General bed mobility comments: Pt up on commode upon arrival. Transfers Overall transfer level: Needs assistance Equipment used: Rolling walker (2 wheels) Transfers: Sit to/from Stand Sit to Stand: Min assist Bed to/from chair/wheelchair/BSC transfer type:: Stand pivot Stand pivot transfers: Min assist Step pivot transfers: +2 physical assistance, Min assist General transfer comment: Cues to scoot to edge of commode and push up with L hand off arm rest, minA to power up to stand and steady. Ambulation/Gait Ambulation/Gait assistance: Min assist Gait Distance (Feet): 8 Feet Assistive device: Rolling walker (2 wheels) Gait Pattern/deviations: Step-to pattern, Trunk flexed General Gait Details: MinA to steady, especially when turning or hopping posteriorly. Pt mildly anxious as she began to fatigue while standing. Gait velocity: slowed Gait velocity interpretation: <1.31 ft/sec, indicative of household ambulator   ADL: ADL Overall ADL's : Needs assistance/impaired Eating/Feeding: Independent Grooming: Wash/dry hands, Wash/dry face, Sitting, Oral care, Min guard, Standing Grooming Details (indicate cue type and reason): patient performed setup for grooming seated and stood to perform with use of sink and RW for support with balance.  Min guard +2 for safety and recent fall Upper Body Bathing: Set up, Sitting Lower Body Bathing: Minimal assistance, Sitting/lateral leans Lower Body Bathing Details (indicate cue type and reason): performed  seated at sink Upper Body Dressing : Set up, Sitting Upper Body Dressing Details (indicate  cue type and reason): changed gowns Lower Body Dressing: Maximal assistance Toilet Transfer: Minimal assistance, +2 for physical assistance, +2 for safety/equipment, Grab bars, Rolling walker (2 wheels), Regular Toilet Toilet Transfer Details (indicate cue type and reason): ambulated to bathroom from EOB with 2 seated breaks before reaching toilet.  Plus 2 assist for safety and patient comfort due to recent fall. Toileting- Clothing Manipulation and Hygiene: Sitting/lateral lean, Moderate assistance Toileting - Clothing Manipulation Details (indicate cue type and reason): performed toilet hygiene seated on toilet. mod assist for clothing management Functional mobility during ADLs: Minimal assistance, +2 for physical assistance, +2 for safety/equipment General ADL Comments: continues to have fear of falling. required 2 seated rest breaks before getting to toilet due to fatigue and rise to step into bathroom   Cognition: Cognition Overall Cognitive Status: Within Functional Limits for tasks assessed Orientation Level: Oriented X4 Cognition Arousal/Alertness: Awake/alert Behavior During Therapy: Anxious Overall Cognitive Status: Within Functional Limits for tasks assessed General Comments: Mildly anxious as time in standing progressed as she reported feeling fatigued and unsteady.   Physical Exam: Blood pressure 107/71, pulse 98, temperature 98.6 F (37 C), temperature source Oral, resp. rate 18, height 5\' 11"  (1.803 m), weight 123.4 kg, last menstrual period 10/04/2021, SpO2 96 %. Physical Exam  General: Alert and oriented x 3, No apparent distress HEENT: Head is normocephalic, atraumatic, PERRLA, EOMI, sclera anicteric, oral mucosa pink and moist, wearing glasses Neck: Supple without JVD or lymphadenopathy Heart: Reg rate and rhythm. No murmurs rubs or gallops Chest: CTA bilaterally without  wheezes, rales, or rhonchi; no distress Abdomen: Soft, non-tender, non-distended, bowel sounds positive. Extremities: No clubbing, cyanosis, or edema.  Right BKA wound VAC in place with good seal; no output in canister. Semirigid BKA protector in place She has insulin pump LUE Psych: Pt's affect is appropriate. Pt is cooperative. Pleasant. A little anxious Skin: Clean and intact without signs of breakdown Neuro:  Follows commands and answers questions. Speech normal. Sensation intact to LT in all 4 extremities. She reports slightly altered sensation in LLE (chronic due to DM) CN 2-12 intact Musculoskeletal: 5/5 in B/L UE and LLE strength. Able to move RLE at hip.  Normal tone and bulk. No joint swelling noted.   IV R wrist    Lab Results Last 48 Hours        Results for orders placed or performed during the hospital encounter of 11/11/21 (from the past 48 hour(s))  Glucose, capillary     Status: Abnormal    Collection Time: 11/14/21  5:25 PM  Result Value Ref Range    Glucose-Capillary 120 (H) 70 - 99 mg/dL      Comment: Glucose reference range applies only to samples taken after fasting for at least 8 hours.  Glucose, capillary     Status: Abnormal    Collection Time: 11/14/21  9:12 PM  Result Value Ref Range    Glucose-Capillary 113 (H) 70 - 99 mg/dL      Comment: Glucose reference range applies only to samples taken after fasting for at least 8 hours.  Glucose, capillary     Status: Abnormal    Collection Time: 11/15/21  7:27 AM  Result Value Ref Range    Glucose-Capillary 163 (H) 70 - 99 mg/dL      Comment: Glucose reference range applies only to samples taken after fasting for at least 8 hours.  Glucose, capillary     Status: Abnormal    Collection Time: 11/15/21 11:47 AM  Result Value Ref Range    Glucose-Capillary 238 (H) 70 - 99 mg/dL      Comment: Glucose reference range applies only to samples taken after fasting for at least 8 hours.    Comment 1 Notify RN     Glucose, capillary     Status: Abnormal    Collection Time: 11/15/21  4:04 PM  Result Value Ref Range    Glucose-Capillary 202 (H) 70 - 99 mg/dL      Comment: Glucose reference range applies only to samples taken after fasting for at least 8 hours.    Comment 1 Notify RN    Glucose, capillary     Status: Abnormal    Collection Time: 11/15/21  8:07 PM  Result Value Ref Range    Glucose-Capillary 161 (H) 70 - 99 mg/dL      Comment: Glucose reference range applies only to samples taken after fasting for at least 8 hours.  Glucose, capillary     Status: Abnormal    Collection Time: 11/16/21  8:18 AM  Result Value Ref Range    Glucose-Capillary 140 (H) 70 - 99 mg/dL      Comment: Glucose reference range applies only to samples taken after fasting for at least 8 hours.  Glucose, capillary     Status: Abnormal    Collection Time: 11/16/21 11:25 AM  Result Value Ref Range    Glucose-Capillary 138 (H) 70 - 99 mg/dL      Comment: Glucose reference range applies only to samples taken after fasting for at least 8 hours.      Imaging Results (Last 48 hours)  No results found.         Blood pressure 107/71, pulse 98, temperature 98.6 F (37 C), temperature source Oral, resp. rate 18, height  (1.803 m), weight 123.4 kg, last menstrual period 10/04/2021, SpO2 96 %.   Medical Problem List and Plan: 1. Functional deficits secondary to right below the knee amputation             -patient may not shower due to wound vac             -ELOS/Goals: 7-10 days 2.  Antithrombotics: -DVT/anticoagulation:  Pharmaceutical: Lovenox             -antiplatelet therapy: Aspirin 81 mg 3. Pain Management: Tylenol and oxycodone as needed, Neurontin 300 mg 3 times daily, amitriptyline 25 mg at bedtime>>migraine prevention  -Monitor for phantom pain and sensation 4. Mood: LCSW to evaluate and provide emotional support             -antipsychotic agents: n/a 5. Neuropsych: This patient is capable of making  decisions on her own behalf. 6. Skin/Wound Care: Routine skin care checks             -- Plan VAC dressing removal Friday 6/2 7. Fluids/Electrolytes/Nutrition: Routine I's and O's and follow-up chemistries 8: DM on insulin pump: Continue current treatment with insulin pump, CBG with fair control, continue to monitor 9: Hypertension: Continue amlodipine and lisinopril -BP overall good control 10: Hyperlipidemia: continue Lipitor 12: Iron deficiency anemia and acute blood loss anemia: continue ferous sulfate and vitamin C. Last Hgb 8.1.  -Repeat labs tomorrow 13: Tobacco use: continue Chantix at bedtime 14: History of migraine headache: none recently; Elavil at night for prevention 15: Constipation: give MoM now; other prn 16: Leukocytosis: afebrile; monitor, follow-up CBC    Milinda Antis, PA-C 11/16/2021   I have personally performed a face  to face diagnostic evaluation of this patient and formulated the key components of the plan.  Additionally, I have personally reviewed laboratory data, imaging studies, as well as relevant notes and concur with the physician assistant's documentation above.  The patient's status has not changed from the original H&P.  Any changes in documentation from the acute care chart have been noted above.  Fanny Dance, MD

## 2021-11-16 NOTE — Progress Notes (Signed)
Inpatient Rehab Admissions Coordinator:    I received insurance auth for CIR and can admit today. RN may call report to [phone].  Megan Salon, MS, CCC-SLP Rehab Admissions Coordinator  [phone] (celll) [phone] (office)

## 2021-11-16 NOTE — Progress Notes (Signed)
PT Transferring to CIR. Report called to receiving unit. Pt has all pt belongings on her. IV left in per request of CIR.

## 2021-11-16 NOTE — Progress Notes (Signed)
Patient arrived on unit. Rehab schedule, medications and plan of care reviewed, patient states an understanding. Patient is Ax4 and has no complications noted at this time. Patient reports pain 0 out of 10. Patient educated on use of call light. Valerie Le G Basheer Molchan  

## 2021-11-16 NOTE — Progress Notes (Signed)
Physical Therapy Treatment Patient Details Name: Valerie Le MRN: [id number] DOB: 03-18-1971 Today's Date: 11/16/2021   History of Present Illness Pt adm 5/26 with osteomyelitis and abscess of residual rt foot. 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  PMH - HTN, DM, obesity, osteomyelitis of the right foot, rt ray amputation, rt transmet amputation    PT Comments    Pt is continuing to demonstrate great motivation and gradual progress with PT. Pt was able to transfer to stand and ambulate a short distance in the room with minAx1 and a RW today. However, as time in standing progressed, so did her fatigue and anxiety. Focused part of session on reviewing and performing s/p BKA HEP. Will continue to follow acutely. Current recommendations remain appropriate.     Recommendations for follow up therapy are one component of a multi-disciplinary discharge planning process, led by the attending physician.  Recommendations may be updated based on patient status, additional functional criteria and insurance authorization.  Follow Up Recommendations  Acute inpatient rehab (3hours/day)     Assistance Recommended at Discharge Intermittent Supervision/Assistance  Patient can return home with the following A little help with walking and/or transfers;A little help with bathing/dressing/bathroom;Assistance with cooking/housework;Assist for transportation;Help with stairs or ramp for entrance   Equipment Recommendations  Rolling walker (2 wheels);Wheelchair (measurements PT);Wheelchair cushion (measurements PT);BSC/3in1    Recommendations for Other Services       Precautions / Restrictions Precautions Precautions: Fall Precaution Comments: wound vac Restrictions Weight Bearing Restrictions: Yes RLE Weight Bearing: Non weight bearing     Mobility  Bed Mobility               General bed mobility comments: Pt up on commode upon arrival.    Transfers Overall transfer level: Needs  assistance Equipment used: Rolling walker (2 wheels) Transfers: Sit to/from Stand Sit to Stand: Min assist           General transfer comment: Cues to scoot to edge of commode and push up with L hand off arm rest, minA to power up to stand and steady.    Ambulation/Gait Ambulation/Gait assistance: Min assist Gait Distance (Feet): 8 Feet Assistive device: Rolling walker (2 wheels) Gait Pattern/deviations: Step-to pattern, Trunk flexed Gait velocity: slowed Gait velocity interpretation: <1.31 ft/sec, indicative of household ambulator   General Gait Details: MinA to steady, especially when turning or hopping posteriorly. Pt mildly anxious as she began to fatigue while standing.   Stairs             Wheelchair Mobility    Modified Rankin (Stroke Patients Only)       Balance Overall balance assessment: Needs assistance Sitting-balance support: No upper extremity supported, Feet supported Sitting balance-Leahy Scale: Normal     Standing balance support: Bilateral upper extremity supported, Reliant on assistive device for balance Standing balance-Leahy Scale: Poor Standing balance comment: reliant on RW for support, min guard-minA                            Cognition Arousal/Alertness: Awake/alert Behavior During Therapy: Anxious Overall Cognitive Status: Within Functional Limits for tasks assessed                                 General Comments: Mildly anxious as time in standing progressed as she reported feeling fatigued and unsteady.        Exercises Amputee Exercises Hip  Extension: AROM, Strengthening, Right, 10 reps, Standing (with RW) Hip ABduction/ADduction: AROM, Strengthening, Right, 10 reps, Standing, Both, Seated (x10 standing hip abduction R with RW; x10 hip adduction pillow squeezes bil seated) Hip Flexion/Marching: AROM, Strengthening, Both, 10 reps, Seated Knee Flexion: AROM, Strengthening, Right, 10 reps, Seated Knee  Extension: AROM, Strengthening, Both, 10 reps, Seated    General Comments General comments (skin integrity, edema, etc.): Provided BKA HEP handout      Pertinent Vitals/Pain Pain Assessment Pain Assessment: 0-10 Pain Score: 2  Pain Location: rt residual limb Pain Descriptors / Indicators: Discomfort, Dull, Operative site guarding Pain Intervention(s): Limited activity within patient's tolerance, Monitored during session, Repositioned, Patient requesting pain meds-RN notified    Home Living                          Prior Function            PT Goals (current goals can now be found in the care plan section) Acute Rehab PT Goals Patient Stated Goal: to improve PT Goal Formulation: With patient Time For Goal Achievement: 11/26/21 Potential to Achieve Goals: Good Progress towards PT goals: Progressing toward goals    Frequency    Min 4X/week      PT Plan Equipment recommendations need to be updated    Co-evaluation              AM-PAC PT "6 Clicks" Mobility   Outcome Measure  Help needed turning from your back to your side while in a flat bed without using bedrails?: A Little Help needed moving from lying on your back to sitting on the side of a flat bed without using bedrails?: A Little Help needed moving to and from a bed to a chair (including a wheelchair)?: A Little Help needed standing up from a chair using your arms (e.g., wheelchair or bedside chair)?: A Little Help needed to walk in hospital room?: Total Help needed climbing 3-5 steps with a railing? : Total 6 Click Score: 14    End of Session Equipment Utilized During Treatment: Gait belt Activity Tolerance: Patient tolerated treatment well Patient left: in chair;with call bell/phone within reach Nurse Communication: Mobility status;Patient requests pain meds PT Visit Diagnosis: Unsteadiness on feet (R26.81);Other abnormalities of gait and mobility (R26.89);Difficulty in walking, not  elsewhere classified (R26.2);Pain Pain - Right/Left: Right Pain - part of body: Leg     Time: 0832-0859 PT Time Calculation (min) (ACUTE ONLY): 27 min  Charges:  $Gait Training: 8-22 mins $Therapeutic Exercise: 8-22 mins                     Raymond Gurney, PT, DPT Acute Rehabilitation Services  Pager: [phone] Office: [phone]    Jewel Baize 11/16/2021, 9:10 AM

## 2021-11-16 NOTE — Discharge Summary (Signed)
Physician Discharge Summary  Patient ID: Valerie Le MRN: [id number] DOB/AGE: [birth year] 52 y.o.  Admit date: 11/16/2021 Discharge date: 11/25/2021  Discharge Diagnoses:  Principal Problem:   S/P BKA (below knee amputation), right (HCC) Active problems: Debility secondary to below the knee amputation Diabetes mellitus type I Hypertension Hyperlipidemia Iron deficiency anemia Tobacco use History of migraine headache Constipation Leukocytosis  Discharged Condition: good  Significant Diagnostic Studies: Labs:  Basic Metabolic Panel: Recent Labs  Lab 11/21/21 0607  NA 136  K 3.6  CL 106  CO2 27  GLUCOSE 160*  BUN 16  CREATININE 0.88  CALCIUM 8.0*    CBC: Recent Labs  Lab 11/21/21 0607  WBC 12.8*  HGB 7.7*  HCT 23.4*  MCV 69.2*  PLT 363    CBG: Recent Labs  Lab 11/24/21 1127 11/24/21 1636 11/24/21 2057 11/25/21 0239 11/25/21 0605  GLUCAP 150* 148* 141* 175* 144*    Brief HPI:   Valerie Le is a 51 y.o. 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  She had previously undergone transmetatarsal amputation.  She was evaluated in the emergency department on 5/17 and admitted to work-up chest pain.  Cardiology was consulted.  EKG was without ischemic changes and serial troponins were reassuring for absence of ACS.  She had MRI evidence of osteomyelitis, significant leukocytosis and was placed on broad-spectrum antibiotics.  Orthopedic surgery was consulted.  She underwent right below the knee amputation by Dr. Lajoyce Corners on 5/26.   Hospital Course: Valerie Le was admitted to rehab 11/16/2021 for inpatient therapies to consist of PT, ST and OT at least three hours five days a week. Past admission physiatrist, therapy team and rehab RN have worked together to provide customized collaborative inpatient rehab. Follow-up labs revealed AST of 13 therefore Tylenol  discontinued. Normalized serum sodium. H and H monitored: slow drift to 7.2/22.7 on 6/2. Leukocytosis trending downward. Remains afebrile.  Incisional VAC dressing removed on 6/2.  Blood pressures were monitored on TID basis and remained controlled on amlodipine 5 mg daily and lisinopril 40 mg daily.  Diabetes has been monitored with ac/hs CBG checks and patient remained on insulin pump management.   Rehab course: During patient's stay in rehab weekly team conferences were held to monitor patient's progress, set goals and discuss barriers to discharge. At admission, patient required min assist with basic self-care skills.  She has had improvement in activity tolerance, balance, postural control as well as ability to compensate for deficits. She has had improvement in functional use RUE/LUE  and RLE/LLE as well as improvement in awareness  Disposition:  Discharge disposition: 01-Home or Self Care     Diet: Carb-modified  Special Instructions:  No driving, alcohol consumption or tobacco use.   30-35 minutes were spent on discharge planning and discharge summary.  Discharge Instructions     Ambulatory referral to Physical Medicine Rehab   Complete by: As directed    Hospital follow-up   Discharge patient   Complete by: As directed    Discharge disposition: 01-Home or Self Care   Discharge patient date: 11/25/2021      Allergies as of 11/25/2021       Reactions   Albiglutide Itching   Itching site reaction with burning, itching and pain   Cherry Anaphylaxis   Coconut (cocos Nucifera) Anaphylaxis   Pineapple Anaphylaxis   Tomato Anaphylaxis   Glimepiride Other (See Comments)  Numbness and abdominal pain   Latex Hives   Metformin And Related Other (See Comments)   Constipation/cramps   Glipizide Diarrhea        Medication List     STOP taking these medications    budesonide 180 MCG/ACT inhaler Commonly known as: PULMICORT   metoCLOPramide 10 MG  tablet Commonly known as: REGLAN   nitroGLYCERIN 0.2 mg/hr patch Commonly known as: NITRODUR - Dosed in mg/24 hr   oxyCODONE-acetaminophen 5-325 MG tablet Commonly known as: PERCOCET/ROXICET       TAKE these medications    amitriptyline 25 MG tablet Commonly known as: ELAVIL Take 1 tablet (25 mg total) by mouth at bedtime.   amLODipine 5 MG tablet Commonly known as: NORVASC Take 1 tablet (5 mg total) by mouth at bedtime. What changed:  medication strength how much to take   aspirin EC 81 MG tablet Take 81 mg by mouth at bedtime.   atorvastatin 80 MG tablet Commonly known as: LIPITOR Take 80 mg by mouth at bedtime.   Baqsimi One Pack 3 MG/DOSE Powd Generic drug: Glucagon Place 3 mg into the nose once as needed (for blood sugar 60 or below).   D3-1000 25 MCG (1000 UT) capsule Generic drug: Cholecalciferol Take 1,000 Units by mouth at bedtime.   Dexcom G6 Receiver Devi change EVERY 90 days   Dexcom G6 Sensor Misc use to check blood sugar change every 10 days   Dexcom G6 Transmitter Misc Dispense and use as directed   FeroSul 325 (65 FE) MG tablet Generic drug: ferrous sulfate Take 1 tablet (325 mg total) by mouth 2 (two) times daily. What changed: when to take this   Fish Oil 1000 MG Caps Take 1,000 mg by mouth at bedtime.   gabapentin 300 MG capsule Commonly known as: NEURONTIN Take 1 capsule (300 mg total) by mouth at bedtime. 3 times a day when necessary neuropathy pain- prn What changed: additional instructions   hydrOXYzine 25 MG tablet Commonly known as: ATARAX Take 1 tablet (25 mg total) by mouth 3 (three) times daily as needed for itching.   ibuprofen 200 MG tablet Commonly known as: ADVIL Take 400 mg by mouth every 6 (six) hours as needed for mild pain.   insulin lispro 100 UNIT/ML injection Commonly known as: HUMALOG Inject 1 Units into the skin continuous. Via pump - 200 units every 3 days   insulin pump Soln Inject 1 each into the  skin continuous. 200 units every three days.   L-Arginine 1000 MG Tabs Take 1,000 mg by mouth daily.   lisinopril 40 MG tablet Commonly known as: ZESTRIL Take 1 tablet (40 mg total) by mouth at bedtime.   ondansetron 4 MG tablet Commonly known as: ZOFRAN Take 1 tablet (4 mg total) by mouth every 6 (six) hours as needed for nausea. What changed: reasons to take this   oxyCODONE 5 MG immediate release tablet Commonly known as: Oxy IR/ROXICODONE Take 1-2 tablets (5-10 mg total) by mouth every 4 (four) hours as needed for moderate pain (pain score 4-6).   pantoprazole 40 MG tablet Commonly known as: PROTONIX Take 1 tablet (40 mg total) by mouth daily. Start taking on: November 26, 2021   sodium chloride 0.65 % nasal spray Commonly known as: OCEAN Place 1-2 sprays into the nose daily as needed for congestion.   SUMAtriptan 25 MG tablet Commonly known as: IMITREX Take 25 mg by mouth every 2 (two) hours as needed for migraine. May repeat in 2  hours if headache persists or recurs.   Trulicity 0.75 MG/0.5ML Sopn Generic drug: Dulaglutide Inject 0.75 mg into the skin every Sunday.   Unifine Pentips Plus 31G X 5 MM Misc Generic drug: Insulin Pen Needle use 1 pen needle 5 times per day   varenicline 1 MG tablet Commonly known as: CHANTIX Take 1 tablet (1 mg total) by mouth at bedtime.   vitamin C 500 MG tablet Commonly known as: ASCORBIC ACID Take 500 mg by mouth at bedtime.        Follow-up Information     Overland[place]ley, Cristie Hemesiree, [place] Follow up.   Specialty: Physician Assistant Why: Call tomorrow to make arrangements for hospital follow-up appointment Contact information: [address] [place] [phone]         Nadara Mustarduda, Marcus V, MD Follow up.   Specialty: Orthopedic Surgery Why: Call tomorrow to make arrangements for hospital follow-up appointment Contact information: [address] [place] [address] [phone]          Horton Chinaulkar, Krutika P, MD Follow up.   Specialty: Physical Medicine and Rehabilitation Why: office will call you to arrange your appt (sent) Contact information: [address] [place] [address] [phone]                 Signed: Milinda AntisSandra J Demaris Bousquet 11/25/2021, 9:22 AM

## 2021-11-16 NOTE — PMR Pre-admission (Signed)
PMR Admission Coordinator Pre-Admission Assessment  Patient: Valerie Le is an 51 y.o., female MRN: [id number] DOB: 1971/01/23 Height: 5' 11"  (180.3 cm) Weight: 123.4 kg  Insurance Information HMO: yes     PPO:      PCP:      IPA:      80/20:      OTHER:  PRIMARY: [employer] Managed      Policy#: [id number]      Subscriber: [name]       Phone#: [phone]     Fax#: [phone] Pre-Cert#: [id number]       Employer:  Benefits:  Phone #:      Name:  [name] Date: 12/17/2020- 12/16/2021 Deductible: $2,000 ($2,000 met) OOP Max: $4,000 ($4,000 met) CIR: 80% coverage, 20% co-insurance SNF: 80% coverage, 20% co-insurance; limited to 60 days/cal yr Outpatient: $50 copay/visit Home Health:  80% coverage, 20% co-insurance DME: 100% coverage, auth required Development worker, community:       Phone#:  Providers: in network  SECONDARY:       Policy#:      Phone#:   The Engineer, petroleum" for patients in Inpatient Rehabilitation Facilities with attached "Privacy Act [place] Records" was provided and verbally reviewed with: Patient  Emergency Contact Information Contact Information     Name Relation Home Work Mobile   [name] Spouse [phone]  [phone]       Current Medical History  Patient Admitting Diagnosis: R BKA History of Present Illness: Patient is a 51 year old woman who is seen in follow-up for abscess and osteomyelitis right foot.  She is status post transmetatarsal amputation. 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  Pt. Seen by OT and PT who recommended CIR to assist return to PLOF.     Patient's medical record from [facility] has been reviewed by the rehabilitation admission coordinator and physician.  Past Medical History  Past Medical History:  Diagnosis Date   Anemia    Asthma    as a child   Depression    Diabetes  mellitus without complication ([place])    Type 1 - Has Insulin Pump   Headache    migraines   High cholesterol    Hypertension     Has the patient had major surgery during 100 days prior to admission? Yes  Family History   family history is not on file.  Current Medications  Current Facility-Administered Medications:    0.9 %  sodium chloride infusion, , Intravenous, Continuous, Newt Minion, MD, Stopped at 11/12/21 0919   acetaminophen (TYLENOL) tablet 325-650 mg, 325-650 mg, Oral, Q6H PRN, Newt Minion, MD   alum & mag hydroxide-simeth (MAALOX/MYLANTA) 200-200-20 MG/5ML suspension 15-30 mL, 15-30 mL, Oral, Q2H PRN, Newt Minion, MD   amitriptyline (ELAVIL) tablet 25 mg, 25 mg, Oral, QHS, Newt Minion, MD, 25 mg at 11/15/21 2113   amLODipine (NORVASC) tablet 10 mg, 10 mg, Oral, QHS, Newt Minion, MD, 10 mg at 11/15/21 2113   ascorbic acid (VITAMIN C) tablet 1,000 mg, 1,000 mg, Oral, Daily, Newt Minion, MD, 1,000 mg at 11/16/21 5009   aspirin EC tablet 81 mg, 81 mg, Oral, Daily, Newt Minion, MD, 81 mg at 11/16/21 0904   atorvastatin (LIPITOR) tablet 80 mg, 80 mg, Oral, QHS, Newt Minion, MD, 80 mg at 11/15/21 2113   bisacodyl (DULCOLAX) EC tablet 5 mg, 5 mg,  Oral, Daily PRN, Newt Minion, MD, 5 mg at 11/15/21 0848   docusate sodium (COLACE) capsule 100 mg, 100 mg, Oral, Daily, Newt Minion, MD, 100 mg at 11/16/21 2233   Dulaglutide SOPN 0.75 mg, 0.75 mg, Subcutaneous, Q Sun, [name], Marcus V, MD   ferrous sulfate tablet 325 mg, 325 mg, Oral, BID WC, Newt Minion, MD, 325 mg at 11/16/21 6122   gabapentin (NEURONTIN) capsule 300 mg, 300 mg, Oral, TID, Newt Minion, MD, 300 mg at 11/16/21 0905   guaiFENesin-dextromethorphan (ROBITUSSIN DM) 100-10 MG/5ML syrup 15 mL, 15 mL, Oral, Q4H PRN, Newt Minion, MD   hydrALAZINE (APRESOLINE) injection 5 mg, 5 mg, Intravenous, Q20 Min PRN, Newt Minion, MD   HYDROmorphone (DILAUDID) injection 0.5-1 mg, 0.5-1 mg, Intravenous,  Q4H PRN, Newt Minion, MD, 1 mg at 11/15/21 0432   HYDROmorphone (DILAUDID) tablet 2-3 mg, 2-3 mg, Oral, Q4H PRN, Newt Minion, MD, 2 mg at 11/12/21 1638   insulin pump, , Subcutaneous, TID WC, HS, 0200, Newt Minion, MD, Given at 11/16/21 1132   labetalol (NORMODYNE) injection 10 mg, 10 mg, Intravenous, Q10 min PRN, Newt Minion, MD   lisinopril (ZESTRIL) tablet 40 mg, 40 mg, Oral, QHS, Newt Minion, MD, 40 mg at 11/15/21 2113   magnesium sulfate IVPB 2 g 50 mL, 2 g, Intravenous, Daily PRN, Newt Minion, MD   metoprolol tartrate (LOPRESSOR) injection 2-5 mg, 2-5 mg, Intravenous, Q2H PRN, Newt Minion, MD   nutrition supplement (JUVEN) (JUVEN) powder packet 1 packet, 1 packet, Oral, BID BM, Newt Minion, MD, 1 packet at 11/16/21 0905   ondansetron [facility]) injection 4 mg, 4 mg, Intravenous, Q6H PRN, Newt Minion, MD, 4 mg at 11/15/21 1557   oxyCODONE (Oxy IR/ROXICODONE) immediate release tablet 5-10 mg, 5-10 mg, Oral, Q4H PRN, Newt Minion, MD, 10 mg at 11/16/21 0905   pantoprazole (PROTONIX) EC tablet 40 mg, 40 mg, Oral, Daily, Newt Minion, MD, 40 mg at 11/16/21 0904   phenol (CHLORASEPTIC) mouth spray 1 spray, 1 spray, Mouth/Throat, PRN, Newt Minion, MD   polyethylene glycol (MIRALAX / GLYCOLAX) packet 17 g, 17 g, Oral, Daily PRN, Newt Minion, MD, 17 g at 11/15/21 1209   potassium chloride SA (KLOR-CON M) CR tablet 20-40 mEq, 20-40 mEq, Oral, Daily PRN, Newt Minion, MD   varenicline (CHANTIX) tablet 1 mg, 1 mg, Oral, QHS, Newt Minion, MD, 1 mg at 11/15/21 2113   zinc sulfate capsule 220 mg, 220 mg, Oral, Daily, Newt Minion, MD, 220 mg at 11/16/21 4497  Patients Current Diet:  Diet Order             Diet - low sodium heart healthy           Diet Carb Modified Fluid consistency: Thin; Room service appropriate? Yes  Diet effective now                   Precautions / Restrictions Precautions Precautions: Fall Precaution Comments: wound  vac Restrictions Weight Bearing Restrictions: Yes RLE Weight Bearing: Non weight bearing LLE Weight Bearing: Weight bearing as tolerated   Has the patient had 2 or more falls or a fall with injury in the past year? No  Prior Activity Level Community (5-7x/wk): pt. active in the community PTA  Prior Functional Level Self Care: Did the patient need help bathing, dressing, using the toilet or eating? Independent  Indoor Mobility: Did the  patient need assistance with walking from room to room (with or without device)? Independent  Stairs: Did the patient need assistance with internal or external stairs (with or without device)? Independent  Functional Cognition: Did the patient need help planning regular tasks such as shopping or remembering to take medications? Independent  Patient Information Are you of Hispanic, Latino/a,or Spanish origin?: A. No, not of Hispanic, Latino/a, or Spanish origin What is your race?: B. Black or African American Do you need or want an interpreter to communicate with a doctor or health care staff?: 0. No  Patient's Response To:  Health Literacy and Transportation Is the patient able to respond to health literacy and transportation needs?: Yes Health Literacy - How often do you need to have someone help you when you read instructions, pamphlets, or other written material from your doctor or pharmacy?: Never In the past 12 months, has lack of transportation kept you from medical appointments or from getting medications?: No In the past 12 months, has lack of transportation kept you from meetings, work, or from getting things needed for daily living?: No  Home Assistive Devices / [place] Devices/Equipment: Eyeglasses, Radio producer (specify quad or straight), CBG Meter Home Equipment: Cane - quad  Prior Device Use: Indicate devices/aids used by the patient prior to current illness, exacerbation or injury? None of the above  Current Functional  Level Cognition  Overall Cognitive Status: Within Functional Limits for tasks assessed Orientation Level: Oriented X4 General Comments: Mildly anxious as time in standing progressed as she reported feeling fatigued and unsteady.    Extremity Assessment (includes Sensation/Coordination)  Upper Extremity Assessment: Overall WFL for tasks assessed  Lower Extremity Assessment: Defer to PT evaluation RLE Deficits / Details: BKA. AROM rt knee 0 -75 degrees in sitting. Strength grossly 3/5    ADLs  Overall ADL's : Needs assistance/impaired Eating/Feeding: Independent Grooming: Wash/dry hands, Wash/dry face, Sitting, Oral care, Min guard, Standing Grooming Details (indicate cue type and reason): patient performed setup for grooming seated and stood to perform with use of sink and RW for support with balance.  Min guard +2 for safety and recent fall Upper Body Bathing: Set up, Sitting Lower Body Bathing: Minimal assistance, Sitting/lateral leans Lower Body Bathing Details (indicate cue type and reason): performed seated at sink Upper Body Dressing : Set up, Sitting Upper Body Dressing Details (indicate cue type and reason): changed gowns Lower Body Dressing: Maximal assistance Toilet Transfer: Minimal assistance, +2 for physical assistance, +2 for safety/equipment, Grab bars, Rolling walker (2 wheels), Regular Toilet Toilet Transfer Details (indicate cue type and reason): ambulated to bathroom from EOB with 2 seated breaks before reaching toilet.  Plus 2 assist for safety and patient comfort due to recent fall. Toileting- Clothing Manipulation and Hygiene: Sitting/lateral lean, Moderate assistance Toileting - Clothing Manipulation Details (indicate cue type and reason): performed toilet hygiene seated on toilet. mod assist for clothing management Functional mobility during ADLs: Minimal assistance, +2 for physical assistance, +2 for safety/equipment General ADL Comments: continues to have fear of  falling. required 2 seated rest breaks before getting to toilet due to fatigue and rise to step into bathroom    Mobility  Overal bed mobility: Needs Assistance Bed Mobility: Supine to Sit Supine to sit: Min guard, HOB elevated General bed mobility comments: Pt up on commode upon arrival.    Transfers  Overall transfer level: Needs assistance Equipment used: Rolling walker (2 wheels) Transfers: Sit to/from Stand Sit to Stand: Min assist Bed to/from chair/wheelchair/BSC transfer  type:: Stand pivot Stand pivot transfers: Min assist Step pivot transfers: +2 physical assistance, Min assist General transfer comment: Cues to scoot to edge of commode and push up with L hand off arm rest, minA to power up to stand and steady.    Ambulation / Gait / Stairs / Wheelchair Mobility  Ambulation/Gait Ambulation/Gait assistance: Herbalist (Feet): 8 Feet Assistive device: Rolling walker (2 wheels) Gait Pattern/deviations: Step-to pattern, Trunk flexed General Gait Details: MinA to steady, especially when turning or hopping posteriorly. Pt mildly anxious as she began to fatigue while standing. Gait velocity: slowed Gait velocity interpretation: <1.31 ft/sec, indicative of household ambulator    Posture / Balance Balance Overall balance assessment: Needs assistance Sitting-balance support: No upper extremity supported, Feet supported Sitting balance-Leahy Scale: Normal Standing balance support: Bilateral upper extremity supported, Reliant on assistive device for balance Standing balance-Leahy Scale: Poor Standing balance comment: reliant on RW for support, min guard-minA    Special needs/care consideration Skin surgical incision   Previous Home Environment (from acute therapy documentation) Living Arrangements: Spouse/significant other Available Help at Discharge: Family, Available 24 hours/day Type of Home: Apartment Home Layout: One level Home Access: Stairs to  enter Entrance Stairs-Rails: Right, Left, Can reach both Entrance Stairs-Number of Steps: 3 Bathroom Shower/Tub: Chiropodist: Standard Home Care Services: No Additional Comments: will have assistance of teenage children if needed  Discharge Living Setting Plans for Discharge Living Setting: Patient's home Type of Home at Discharge: Apartment Discharge Home Layout: One level Discharge Home Access: Stairs to enter Entrance Stairs-Rails: Right, Can reach both Entrance Stairs-Number of Steps: 3 Discharge Bathroom Shower/Tub: Tub/shower unit Discharge Bathroom Toilet: Standard Discharge Bathroom Accessibility: Yes How Accessible: Accessible via walker Does the patient have any problems obtaining your medications?: No  Social/Family/Support Systems Patient Roles: Spouse Contact Information: [phone] Anticipated Caregiver: Junie Spencer Fischer Ability/Limitations of Caregiver: Can do min A Caregiver Availability: 24/7 Discharge Plan Discussed with Primary Caregiver: Yes Is Caregiver In Agreement with Plan?: Yes Does Caregiver/Family have Issues with Lodging/Transportation while Pt is in Rehab?: No  Goals Patient/Family Goal for Rehab: PT/OT Mod I Expected length of stay: 7-10 days Pt/Family Agrees to Admission and willing to participate: Yes Program Orientation Provided & Reviewed with Pt/Caregiver Including Roles  & Responsibilities: Yes  Decrease burden of Care through IP rehab admission: Specialzed equipment needs, Decrease number of caregivers, Bowel and bladder program, and Patient/family education  Possible need for SNF placement upon discharge: not anticipated   Patient Condition: I have reviewed medical records from [facility] , spoken with CM, and patient and family member. I met with patient at the bedside and discussed via phone for inpatient rehabilitation assessment.  Patient will benefit from ongoing PT and OT, can actively  participate in  3 hours of therapy a day 5 days of the week, and can make measurable gains during the admission.  Patient will also benefit from the coordinated team approach during an Inpatient Acute Rehabilitation admission.  The patient will receive intensive therapy as well as Rehabilitation physician, nursing, social worker, and care management interventions.  Due to safety, skin/wound care, disease management, medication administration, pain management, and patient education the patient requires 24 hour a day rehabilitation nursing.  The patient is currently min A with mobility and basic ADLs.  Discharge setting and therapy post discharge at home with home health is anticipated.  Patient has agreed to participate in the Acute Inpatient Rehabilitation Program and will admit today.  Preadmission  Screen Completed By:  Genella Mech, 11/16/2021 2:31 PM ______________________________________________________________________   Discussed status with Dr. Curlene Dolphin  on 11/16/21 at 57 and received approval for admission today.  Admission Coordinator:  Genella Mech, CCC-SLP, time 1437/Date 11/16/21   Assessment/Plan: Diagnosis: R BKA Does the need for close, 24 hr/day Medical supervision in concert with the patient's rehab needs make it unreasonable for this patient to be served in a less intensive setting? Yes Co-Morbidities requiring supervision/potential complications:Asthma, DM type 1, headache, Migraine, HTN Due to bladder management, bowel management, safety, skin/wound care, disease management, medication administration, pain management, and patient education, does the patient require 24 hr/day rehab nursing? Yes Does the patient require coordinated care of a physician, rehab nurse, PT, OT, and SLP to address physical and functional deficits in the context of the above medical diagnosis(es)? Yes Addressing deficits in the following areas: balance, endurance, locomotion, strength, transferring,  bowel/bladder control, bathing, dressing, feeding, grooming, and toileting Can the patient actively participate in an intensive therapy program of at least 3 hrs of therapy 5 days a week? Yes The potential for patient to make measurable gains while on inpatient rehab is excellent Anticipated functional outcomes upon discharge from inpatient rehab: modified independent PT, modified independent OT, n/a SLP Estimated rehab length of stay to reach the above functional goals is: 7-10 Anticipated discharge destination: Other 10. Overall Rehab/Functional Prognosis: excellent   MD Signature: Jennye Boroughs

## 2021-11-16 NOTE — Progress Notes (Signed)
Patient ID: Valerie Le, female   DOB: July 29, 1970, 51 y.o.   MRN: [id number] Patient is status post transtibial amputation.  There is no drainage in the wound VAC canister.  Patient is improving function and range of motion of her residual extremity.  Patient is still waiting authorization and bed availability for inpatient rehab.

## 2021-11-17 ENCOUNTER — Encounter (HOSPITAL_COMMUNITY): Payer: Self-pay | Admitting: Orthopedic Surgery

## 2021-11-17 DIAGNOSIS — M25561 Pain in right knee: Secondary | ICD-10-CM | POA: Diagnosis not present

## 2021-11-17 DIAGNOSIS — R7401 Elevation of levels of liver transaminase levels: Secondary | ICD-10-CM | POA: Diagnosis not present

## 2021-11-17 DIAGNOSIS — D500 Iron deficiency anemia secondary to blood loss (chronic): Secondary | ICD-10-CM | POA: Diagnosis not present

## 2021-11-17 DIAGNOSIS — Z89511 Acquired absence of right leg below knee: Secondary | ICD-10-CM | POA: Diagnosis not present

## 2021-11-17 LAB — COMPREHENSIVE METABOLIC PANEL
ALT: 10 U/L (ref 0–44)
AST: 13 U/L — ABNORMAL LOW (ref 15–41)
Albumin: 1.9 g/dL — ABNORMAL LOW (ref 3.5–5.0)
Alkaline Phosphatase: 59 U/L (ref 38–126)
Anion gap: 6 (ref 5–15)
BUN: 30 mg/dL — ABNORMAL HIGH (ref 6–20)
CO2: 29 mmol/L (ref 22–32)
Calcium: 8.3 mg/dL — ABNORMAL LOW (ref 8.9–10.3)
Chloride: 102 mmol/L (ref 98–111)
Creatinine, Ser: 1.1 mg/dL — ABNORMAL HIGH (ref 0.44–1.00)
GFR, Estimated: >60 mL/min (ref >60)
Glucose, Bld: 168 mg/dL — ABNORMAL HIGH (ref 70–99)
Potassium: 3.6 mmol/L (ref 3.5–5.1)
Sodium: 137 mmol/L (ref 135–145)
Total Bilirubin: 0.4 mg/dL (ref 0.3–1.2)
Total Protein: 6.2 g/dL — ABNORMAL LOW (ref 6.5–8.1)

## 2021-11-17 LAB — CBC WITH DIFFERENTIAL/PLATELET
Abs Immature Granulocytes: 0.18 10*3/uL — ABNORMAL HIGH (ref 0.00–0.07)
Basophils Absolute: 0.1 10*3/uL (ref 0.0–0.1)
Basophils Relative: 1 %
Eosinophils Absolute: 0.8 10*3/uL — ABNORMAL HIGH (ref 0.0–0.5)
Eosinophils Relative: 6 %
HCT: 22.7 % — ABNORMAL LOW (ref 36.0–46.0)
Hemoglobin: 7.2 g/dL — ABNORMAL LOW (ref 12.0–15.0)
Immature Granulocytes: 1 %
Lymphocytes Relative: 22 %
Lymphs Abs: 3.2 10*3/uL (ref 0.7–4.0)
MCH: 22.2 pg — ABNORMAL LOW (ref 26.0–34.0)
MCHC: 31.7 g/dL (ref 30.0–36.0)
MCV: 69.8 fL — ABNORMAL LOW (ref 80.0–100.0)
Monocytes Absolute: 1.1 10*3/uL — ABNORMAL HIGH (ref 0.1–1.0)
Monocytes Relative: 8 %
Neutro Abs: 9.3 10*3/uL — ABNORMAL HIGH (ref 1.7–7.7)
Neutrophils Relative %: 62 %
Platelets: 331 10*3/uL (ref 150–400)
RBC: 3.25 MIL/uL — ABNORMAL LOW (ref 3.87–5.11)
RDW: 17.9 % — ABNORMAL HIGH (ref 11.5–15.5)
WBC: 14.7 10*3/uL — ABNORMAL HIGH (ref 4.0–10.5)
nRBC: 0 % (ref 0.0–0.2)

## 2021-11-17 LAB — VITAMIN D 25 HYDROXY (VIT D DEFICIENCY, FRACTURES): Vit D, 25-Hydroxy: 33.19 ng/mL (ref 30–100)

## 2021-11-17 LAB — GLUCOSE, CAPILLARY
Glucose-Capillary: 156 mg/dL — ABNORMAL HIGH (ref 70–99)
Glucose-Capillary: 105 mg/dL — ABNORMAL HIGH (ref 70–99)
Glucose-Capillary: 106 mg/dL — ABNORMAL HIGH (ref 70–99)
Glucose-Capillary: 98 mg/dL (ref 70–99)

## 2021-11-17 MED ORDER — SORBITOL 70 % SOLN
60.0000 mL | Status: AC
  Administered 2021-11-17: 60 mL via ORAL
  Filled 2021-11-17: qty 60

## 2021-11-17 MED ORDER — DICLOFENAC SODIUM 1 % EX GEL: 2.0000 g | Freq: Four times a day (QID) | CUTANEOUS | Status: DC | PRN

## 2021-11-17 MED ORDER — POTASSIUM CHLORIDE 20 MEQ PO PACK
40.0000 meq | PACK | Freq: Once | ORAL | Status: AC
  Administered 2021-11-17: 40 meq via ORAL
  Filled 2021-11-17: qty 2

## 2021-11-17 NOTE — Evaluation (Signed)
Physical Therapy Assessment and Plan  Patient Details  Name: Valerie Le MRN: [id number] Date of Birth: 28-Sep-1970  PT Diagnosis: Abnormality of gait, Difficulty walking, Edema, Muscle weakness, and Pain in R LE Rehab Potential: Good ELOS: ~2-2.5 weeks   Today's Date: 11/17/2021 PT Individual Time: 0807-0907 PT Individual Time Calculation (min): 60 min    Hospital Problem: Principal Problem:   S/P BKA (below knee amputation), right [facility])   Past Medical History:  Past Medical History:  Diagnosis Date   Anemia    Asthma    as a child   Depression    Diabetes mellitus without complication ([place])    Type 1 - Has Insulin Pump   Headache    migraines   High cholesterol    Hypertension    Past Surgical History:  Past Surgical History:  Procedure Laterality Date   AMPUTATION Right 12/22/2020   Procedure: RIGHT TRANSMETATARSAL AMPUTATION;  Surgeon: Newt Minion, MD;  Location: [place];  Service: Orthopedics;  Laterality: Right;   AMPUTATION Right 06/29/2021   Procedure: REVISION RIGHT TRANSMETATARSAL AMPUTATION;  Surgeon: Newt Minion, MD;  Location: [place];  Service: Orthopedics;  Laterality: Right;   AMPUTATION Right 11/11/2021   Procedure: RIGHT BELOW KNEE AMPUTATION;  Surgeon: Newt Minion, MD;  Location: [place];  Service: Orthopedics;  Laterality: Right;   AMPUTATION TOE Right 12/04/2020   Procedure: AMPUTATION RIGHT SECOND TOE, I&D FOOT;  Surgeon: Marybelle Killings, MD;  Location: [facility];  Service: Orthopedics;  Laterality: Right;  Right second toe amputation, I&D right foot.   APPLICATION OF WOUND VAC  11/11/2021   Procedure: APPLICATION OF WOUND VAC;  Surgeon: Newt Minion, MD;  Location: [place];  Service: Orthopedics;;   birth control implant  07/31/2006   Essure Implant   TONSILLECTOMY     TYMPANOSTOMY TUBE PLACEMENT     WISDOM TOOTH EXTRACTION      Assessment & Plan Clinical Impression: Patient is a 51 y.o. year old female with a history of diabetes mellitus  type 1 status post right transmetatarsal amputation recently evaluated by Dr. Sharol Given for ongoing chronic draining wound.  She was receiving oral Augmentin.  She presented to the emergency department on 11/02/2021 with complaints of intermittent chest pain.  This was accompanied by nausea and vomiting.  She denied fever or shortness of breath.  Laboratory work-up revealed downtrending troponin, leukocytosis.  EKG without significant ischemic changes.  Chest pain resolved with aspirin and nitroglycerin.  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  She was admitted and placed on Rocephin and itraconazole.  MRI of the right foot revealed soft tissue ulceration around the base of the fifth metatarsal with soft tissue emphysema and inflammatory changes consistent with soft tissue infection.  Orthopedic surgery consulted and she had a R BKA with application of wound vac completed on 11/11/21.  She did have a fall on 5/27 without significant injury on 5/27 when trying to get to the bathroom.Patient transferred to CIR on 11/16/2021 .   Patient currently requires mod assist with mobility secondary to muscle weakness, decreased cardiorespiratoy endurance, and decreased standing balance, decreased postural control, decreased balance strategies, and difficulty maintaining precautions.  Prior to hospitalization, patient was independent  with mobility and lived with Spouse, Son, Daughter, Other (Comment) (husband Junie Spencer), 2 sons & daughter (children are 10, 48, &15)) in a Apartment home.  Home access is 3Stairs to enter.  Patient will benefit from skilled PT intervention to  maximize safe functional mobility, minimize fall risk, and decrease caregiver burden for planned discharge home with 24 hour supervision.  Anticipate patient will benefit from follow up [place] at discharge.  PT - End of Session Activity Tolerance: Tolerates 30+ min activity with multiple rests Endurance  Deficit: Yes Endurance Deficit Description: pt fatigues quickly with standing/ambulating mobility tasks requiring seated rest breaks PT Assessment Rehab Potential (ACUTE/IP ONLY): Good PT Barriers to Discharge: [place] home environment;Decreased caregiver support;Home environment access/layout;Lack of/limited family support;Weight bearing restrictions PT Patient demonstrates impairments in the following area(s): Balance;Safety;Sensory;Edema;Endurance;Skin Integrity;Motor;Nutrition;Pain PT Transfers Functional Problem(s): Bed Mobility;Bed to Chair;Car;Furniture PT Locomotion Functional Problem(s): Ambulation;Wheelchair Mobility;Stairs PT Plan PT Intensity: Minimum of 1-2 x/day ,45 to 90 minutes PT Frequency: 5 out of 7 days PT Duration Estimated Length of Stay: ~2-2.5 weeks PT Treatment/Interventions: Ambulation/gait training;Community reintegration;DME/adaptive equipment instruction;Neuromuscular re-education;Psychosocial support;Stair training;Wheelchair propulsion/positioning;UE/LE Strength taining/ROM;Balance/vestibular training;Discharge planning;Pain management;Skin care/wound management;Therapeutic Activities;UE/LE Coordination activities;Cognitive remediation/compensation;Disease management/prevention;Functional mobility training;Patient/family education;Splinting/orthotics;Therapeutic Exercise;Visual/perceptual remediation/compensation PT Transfers Anticipated Outcome(s): supervision using LRAD PT Locomotion Anticipated Outcome(s): supervision using LRAD PT Recommendation Recommendations for Other Services: Therapeutic Recreation consult Therapeutic Recreation Interventions: Stress management;Outing/community reintergration Follow Up Recommendations: Home health PT;24 hour supervision/assistance Patient destination: Home Equipment Recommended: To be determined   PT Evaluation Precautions/Restrictions Precautions Precautions: Fall;Other (comment) Precaution Comments: R LE wound  vac to BKA Restrictions Weight Bearing Restrictions: Yes RLE Weight Bearing: Non weight bearing Pain  Pain Assessment Pain Scale: 0-10 Pain Score: 5  (pt states no pain at rest but increases with mobility) Pain Type: Acute pain (pt states she feels it is related to her fall) Pain Location: Knee Pain Orientation: Right;Medial;Proximal Pain Descriptors / Indicators: Aching Pain Intervention(s): Medication (See eMAR);Distraction;Emotional support;Repositioned;Relaxation;Rest Pain Interference Pain Interference Pain Effect on Sleep: 3. Frequently Pain Interference with Therapy Activities: 1. Rarely or not at all Pain Interference with Day-to-Day Activities: 1. Rarely or not at all Home Living/Prior [place] Available Help at Discharge: Family;Available 24 hours/day Type of Home: Apartment Home Access: Stairs to enter Entrance Stairs-Number of Steps: 3 Entrance Stairs-Rails: Right;Left;Can reach both Home Layout: One level  Lives With: Spouse;Son;Daughter;Other (Comment) (husband Junie Spencer), 2 sons & daughter (children are 71, 52, &15)) Prior Function Level of Independence: Independent with transfers;Independent with gait;Independent with homemaking with ambulation (reports NOT using the cane)  Able to Take Stairs?: Yes Driving: Yes Vocation: Full time employment Vocation Requirements: Education officer, museum Vision/Perception  Vision - History Ability to See in Adequate Light: 0 Adequate Perception Perception: Within Functional Limits Praxis Praxis: Intact Cognition Overall Cognitive Status: Within Functional Limits for tasks assessed Arousal/Alertness: Awake/alert Orientation Level: Oriented X4 Year: 2023 Month: June Day of Week: Correct Attention: Focused;Sustained;Selective Focused Attention: Appears intact Sustained Attention: Appears intact Selective Attention: Appears intact Memory: Appears intact Awareness: Appears intact Safety/Judgment: Appears  intact Sensation Sensation Light Touch: Impaired Detail Peripheral sensation comments: pt reports hx of peripheral neuropathy but is able to feel ligh touch on the screen Light Touch Impaired Details: Impaired LLE Hot/Cold: Not tested Proprioception: Appears Intact Stereognosis: Not tested Coordination Gross Motor Movements are Fluid and Coordinated: No Coordination and Movement Description: impaired due to generalized weakness and R LE BKA with NWBing restrictions Motor  Motor Motor: Other (comment) Motor - Skilled Clinical Observations: generalized weakness with impaired balance due to R LE BKA with NWBing restrictions   Trunk/Postural Assessment  Cervical Assessment Cervical Assessment: Within Functional Limits Thoracic Assessment Thoracic Assessment: Within Functional Limits Lumbar Assessment Lumbar Assessment: Within Functional Limits Postural Control Postural Control: Deficits on evaluation  Postural Limitations: impaired in standing requiring B UE support on AD and assist due to R LE NWBing restrictions  Balance Balance Balance Assessed: Yes Static Sitting Balance Static Sitting - Level of Assistance: 5: Stand by assistance Dynamic Sitting Balance Dynamic Sitting - Level of Assistance: 5: Stand by assistance;Other (comment) (CGA) Static Standing Balance Static Standing - Balance Support: During functional activity;Bilateral upper extremity supported Static Standing - Level of Assistance: 4: Min assist Dynamic Standing Balance Dynamic Standing - Balance Support: During functional activity;Bilateral upper extremity supported Dynamic Standing - Level of Assistance: 3: Mod assist Extremity Assessment      RLE Assessment RLE Assessment: Exceptions to [facility] Active Range of Motion (AROM) Comments: WFL, no knee ROM restrictions noted at this time with pt able to achieve full extension and > 90 degrees of flexion General Strength Comments: assessed in supine RLE  Strength Right Hip Flexion: 4/5 Right Hip Extension: 4/5 Right Hip ABduction: 4/5 Right Hip ADduction: 4/5 Right Knee Flexion: 3/5 (did not provide resistance to avoid pain) Right Knee Extension: 3/5 (did not provide resistance to avoid pain) LLE Assessment LLE Assessment: Exceptions to [facility] Active Range of Motion (AROM) Comments: WFL/WNL General Strength Comments: assessed in supine LLE Strength Left Hip Flexion: 4+/5 Left Knee Flexion: 4+/5 Left Knee Extension: 5/5 Left Ankle Dorsiflexion: 4+/5 Left Ankle Plantar Flexion: 4+/5  Care Tool Care Tool Bed Mobility Roll left and right activity   Roll left and right assist level: Supervision/Verbal cueing    Sit to lying activity   Sit to lying assist level: Contact Guard/Touching assist    Lying to sitting on side of bed activity   Lying to sitting on side of bed assist level: the ability to move from lying on the back to sitting on the side of the bed with no back support.: Contact Guard/Touching assist     Care Tool Transfers Sit to stand transfer   Sit to stand assist level: Minimal Assistance - Patient > 75% Sit to stand assistive device: Walker;Armrests  Chair/bed transfer   Chair/bed transfer assist level: Moderate Assistance - Patient 50 - 74% Chair/bed transfer assistive device: Armrests;Walker   Physiological scientist transfer assist level: Moderate Assistance - Patient 50 - 74% Health visitor Comment: RW    Care Tool Locomotion Ambulation   Assist level: Minimal Assistance - Patient > 75% Assistive device: Walker-rolling Max distance: 62f  Walk 10 feet activity Walk 10 feet activity did not occur: Safety/medical concerns       Walk 50 feet with 2 turns activity Walk 50 feet with 2 turns activity did not occur: Safety/medical concerns      Walk 150 feet activity Walk 150 feet activity did not occur: Safety/medical concerns      Walk 10 feet on uneven surfaces activity  Walk 10 feet on uneven surfaces activity did not occur: Safety/medical concerns      Stairs Stair activity did not occur: Safety/medical concerns        Walk up/down 1 step activity Walk up/down 1 step or curb (drop down) activity did not occur: Safety/medical concerns      Walk up/down 4 steps activity Walk up/down 4 steps activity did not occur: Safety/medical concerns      Walk up/down 12 steps activity Walk up/down 12 steps activity did not occur: Safety/medical concerns      Pick up small objects from floor   Pick up small object  from the floor assist level: Total Assistance - Patient < 25% Pick up small object from the floor assistive device: using RW  Wheelchair Is the patient using a wheelchair?: Yes Type of Wheelchair: Manual   Wheelchair assist level: Supervision/Verbal cueing Max wheelchair distance: 142f  Wheel 50 feet with 2 turns activity   Assist Level: Supervision/Verbal cueing  Wheel 150 feet activity   Assist Level: Minimal Assistance - Patient > 75%    Refer to Care Plan for Long Term Goals  SHORT TERM GOAL WEEK 1 PT Short Term Goal 1 (Week 1): Pt will perform sit<>stands using LRAD with CGA PT Short Term Goal 2 (Week 1): Pt will perform bed<>chair transfers using LRAD with CGA PT Short Term Goal 3 (Week 1): Pt will ambulate at least 269fusing LRAD with CGA PT Short Term Goal 4 (Week 1): Pt will navigate up/down 2 steps using HRs with CGA PT Short Term Goal 5 (Week 1): Pt will propel w/c at least [id number] supervision  Recommendations for other services: Therapeutic Recreation  Stress management and Outing/community reintegration  Skilled Therapeutic Intervention Pt received supine in bed and agreeable to therapy session. Evaluation completed (see details above) with patient education regarding purpose of PT evaluation, PT POC and goals, therapy schedule, weekly team meetings, and other CIR information including safety plan and fall risk safety.  Noticed pt's wound vac off with batter dead therefore notified nurse to address. Pt performed the below functional mobility tasks with the specified levels of skilled assistance and cuing. Educated pt on importance of maintaining full R knee flexion and extension ROM as well as importance of wearing limb guard when mobilizing. Pt wearing shrinker over wound vac during session. During transfers and gait training pt with posterior LOB most consistently but overall increased instability. Pt also noted to fatigue quickly with gait limiting distance. Pt not currently safe to initiate stair navigation training. At end of session, pt left seated in w/c with needs in reach, seat belt alarm on, and SW present.   Mobility Bed Mobility Bed Mobility: Supine to Sit;Sit to Supine Supine to Sit: Supervision/Verbal cueing Sit to Supine: Supervision/Verbal cueing Transfers Transfers: Sit to Stand;Stand to Sit;Stand Pivot Transfers Sit to Stand: Minimal Assistance - Patient > 75% Stand to Sit: Minimal Assistance - Patient > 75% Stand Pivot Transfers: Minimal Assistance - Patient > 75%;Moderate Assistance - Patient 50 - 74% Stand Pivot Transfer Details: Verbal cues for sequencing;Verbal cues for technique;Verbal cues for precautions/safety;Verbal cues for safe use of DME/AE;Verbal cues for gait pattern;Manual facilitation for weight shifting;Tactile cues for weight shifting;Tactile cues for sequencing;Tactile cues for posture;Tactile cues for placement;Visual cues for safe use of DME/AE Transfer (Assistive device): Rolling walker Locomotion  Gait Ambulation: Yes Gait Assistance: Minimal Assistance - Patient > 75%;Moderate Assistance - Patient 50-74% Gait Distance (Feet): 6 Feet Assistive device: Rolling walker Gait Assistance Details: Verbal cues for technique;Verbal cues for sequencing;Visual cues for safe use of DME/AE;Visual cues/gestures for precautions/safety;Visual cues/gestures for sequencing;Verbal cues for  precautions/safety;Verbal cues for gait pattern;Verbal cues for safe use of DME/AE;Manual facilitation for weight shifting;Tactile cues for sequencing;Tactile cues for weight shifting;Tactile cues for posture Gait Gait: Yes Gait Pattern: Impaired Gait Pattern: Poor foot clearance - left;Step-to pattern (using RW) Gait velocity: significantly decreased with very short forward hops and minimal foot clearance Stairs / Additional Locomotion Stairs: No Wheelchair Mobility Wheelchair Mobility: Yes Wheelchair Assistance: SupChartered loss adjusteroth upper extremities Wheelchair Parts Management: Needs assistance Distance: [id number] Criteria:  Patient will be discharged from PT if patient refuses treatment 3 consecutive times without medical reason, if treatment goals not met, if there is a change in medical status, if patient makes no progress towards goals or if patient is discharged from hospital.  The above assessment, treatment plan, treatment alternatives and goals were discussed and mutually agreed upon: by patient  Tawana Scale , PT, DPT, NCS, CSRS  11/17/2021, 7:53 AM

## 2021-11-17 NOTE — Plan of Care (Signed)
  Problem: RH Balance Goal: LTG Patient will maintain dynamic sitting balance (PT) Description: LTG:  Patient will maintain dynamic sitting balance with assistance during mobility activities (PT) Flowsheets (Taken 11/17/2021 2337) LTG: Pt will maintain dynamic sitting balance during mobility activities with:: Independent with assistive device  Goal: LTG Patient will maintain dynamic standing balance (PT) Description: LTG:  Patient will maintain dynamic standing balance with assistance during mobility activities (PT) Flowsheets (Taken 11/17/2021 2337) LTG: Pt will maintain dynamic standing balance during mobility activities with:: Supervision/Verbal cueing   Problem: Sit to Stand Goal: LTG:  Patient will perform sit to stand with assistance level (PT) Description: LTG:  Patient will perform sit to stand with assistance level (PT) Flowsheets (Taken 11/17/2021 2337) LTG: PT will perform sit to stand in preparation for functional mobility with assistance level: Supervision/Verbal cueing   Problem: RH Bed Mobility Goal: LTG Patient will perform bed mobility with assist (PT) Description: LTG: Patient will perform bed mobility with assistance, with/without cues (PT). Flowsheets (Taken 11/17/2021 2337) LTG: Pt will perform bed mobility with assistance level of: Supervision/Verbal cueing   Problem: RH Bed to Chair Transfers Goal: LTG Patient will perform bed/chair transfers w/assist (PT) Description: LTG: Patient will perform bed to chair transfers with assistance (PT). Flowsheets (Taken 11/17/2021 2337) LTG: Pt will perform Bed to Chair Transfers with assistance level: Supervision/Verbal cueing   Problem: RH Car Transfers Goal: LTG Patient will perform car transfers with assist (PT) Description: LTG: Patient will perform car transfers with assistance (PT). Flowsheets (Taken 11/17/2021 2337) LTG: Pt will perform car transfers with assist:: Supervision/Verbal cueing   Problem: RH Ambulation Goal: LTG  Patient will ambulate in controlled environment (PT) Description: LTG: Patient will ambulate in a controlled environment, # of feet with assistance (PT). Flowsheets (Taken 11/17/2021 2337) LTG: Pt will ambulate in controlled environ  assist needed:: Supervision/Verbal cueing LTG: Ambulation distance in controlled environment: 60ft using LRAD Goal: LTG Patient will ambulate in home environment (PT) Description: LTG: Patient will ambulate in home environment, # of feet with assistance (PT). Flowsheets (Taken 11/17/2021 2337) LTG: Pt will ambulate in home environ  assist needed:: Supervision/Verbal cueing LTG: Ambulation distance in home environment: 78ft using LRAD   Problem: RH Wheelchair Mobility Goal: LTG Patient will propel w/c in controlled environment (PT) Description: LTG: Patient will propel wheelchair in controlled environment, # of feet with assist (PT) Flowsheets (Taken 11/17/2021 2337) LTG: Pt will propel w/c in controlled environ  assist needed:: Supervision/Verbal cueing LTG: Propel w/c distance in controlled environment: 181ft Goal: LTG Patient will propel w/c in home environment (PT) Description: LTG: Patient will propel wheelchair in home environment, # of feet with assistance (PT). Flowsheets (Taken 11/17/2021 2337) LTG: Pt will propel w/c in home environ  assist needed:: Supervision/Verbal cueing LTG: Propel w/c distance in home environment: 24ft   Problem: RH Stairs Goal: LTG Patient will ambulate up and down stairs w/assist (PT) Description: LTG: Patient will ambulate up and down # of stairs with assistance (PT) Flowsheets (Taken 11/17/2021 2337) LTG: Pt will ambulate up/down stairs assist needed:: Contact Guard/Touching assist LTG: Pt will  ambulate up and down number of stairs: 3 steps using HRs per home set-up

## 2021-11-17 NOTE — Progress Notes (Signed)
PROGRESS NOTE   Subjective/Complaints: No new complaints this morning.  When asked, she notes 5/10 pain along the medial aspect of her right knee. Denies pain in residual limb  ROS: +pain in medial aspect of right knee   Objective:   No results found. Recent Labs    11/17/21 0520  WBC 14.7*  HGB 7.2*  HCT 22.7*  PLT 331   Recent Labs    11/17/21 0520  NA 137  K 3.6  CL 102  CO2 29  GLUCOSE 168*  BUN 30*  CREATININE 1.10*  CALCIUM 8.3*    Intake/Output Summary (Last 24 hours) at 11/17/2021 0928 Last data filed at 11/17/2021 [id number] Gross per 24 hour  Intake 240 ml  Output --  Net 240 ml        Physical Exam: Vital Signs Blood pressure 100/74, pulse 81, temperature (!) 97.4 F (36.3 C), temperature source Oral, resp. rate 16, height 5\' 11"  (1.803 m), weight 119.7 kg, SpO2 96 %. Gen: no distress, normal appearing HEENT: oral mucosa pink and moist, NCAT Cardio: Reg rate Chest: normal effort, normal rate of breathing Abd: soft, non-distended Ext: Right BKA wound VAC in place with good seal; no output in canister. Semirigid BKA protector in place She has insulin pump LUE Psych: Pt's affect is appropriate. Pt is cooperative. Pleasant. A little anxious Skin: Clean and intact without signs of breakdown Neuro:  Follows commands and answers questions. Speech normal. Sensation intact to LT in all 4 extremities. She reports slightly altered sensation in LLE (chronic due to DM) CN 2-12 intact Musculoskeletal: 5/5 in B/L UE and LLE strength. Able to move RLE at hip.  Normal tone and bulk. No joint swelling noted.    Assessment/Plan: 1. Functional deficits which require 3+ hours per day of interdisciplinary therapy in a comprehensive inpatient rehab setting. Physiatrist is providing close team supervision and 24 hour management of active medical problems listed below. Physiatrist and rehab team continue to assess  barriers to discharge/monitor patient progress toward functional and medical goals  Care Tool:  Bathing              Bathing assist       Upper Body Dressing/Undressing Upper body dressing   What is the patient wearing?: Dress    Upper body assist Assist Level: Minimal Assistance - Patient > 75%    Lower Body Dressing/Undressing Lower body dressing            Lower body assist       Toileting Toileting    Toileting assist       Transfers Chair/bed transfer  Transfers assist           Locomotion Ambulation   Ambulation assist              Walk 10 feet activity   Assist           Walk 50 feet activity   Assist           Walk 150 feet activity   Assist           Walk 10 feet on uneven surface  activity   Assist  Wheelchair     Assist               Wheelchair 50 feet with 2 turns activity    Assist            Wheelchair 150 feet activity     Assist          Blood pressure 100/74, pulse 81, temperature (!) 97.4 F (36.3 C), temperature source Oral, resp. rate 16, height 5\' 11"  (1.803 m), weight 119.7 kg, SpO2 96 %.    Medical Problem List and Plan: 1. Functional deficits secondary to right below the knee amputation             -patient may not shower due to wound vac             -ELOS/Goals: 7-10 days  -Initial CIR evaluations today 2.  Antithrombotics: -DVT/anticoagulation:  Pharmaceutical: Lovenox             -antiplatelet therapy: Aspirin 81 mg 3. Pain along medial aspect of right knee: Voltaren gel ordered prn. Tylenol and oxycodone as needed, Neurontin 300 mg 3 times daily, amitriptyline 25 mg at bedtime>>migraine prevention             -Monitor for phantom pain and sensation 4. Mood: LCSW to evaluate and provide emotional support             -antipsychotic agents: n/a 5. Neuropsych: This patient is capable of making decisions on her own behalf. 6. Skin/Wound  Care: Routine skin care checks             -- Plan VAC dressing removal Friday 6/2 7. Fluids/Electrolytes/Nutrition: Routine I's and O's and follow-up chemistries 8: DM on insulin pump: Continue current treatment with insulin pump, CBG with fair control, continue to monitor 9: Hypertension: Continue amlodipine and lisinopril -BP overall good control 10: Hyperlipidemia: continue Lipitor 12: Iron deficiency anemia and acute blood loss anemia: continue ferous sulfate and vitamin C. Last Hgb 7.2, repeat Hgb Monday 13: Tobacco use: continue Chantix at bedtime 14: History of migraine headache: none recently; Elavil at night for prevention 15: Constipation: give MoM now; other prn 16: Leukocytosis: improving, discussed with patient. Repeat WBC Monday.  17. Transaminitis: d/c Tylenol.     LOS: 1 days A FACE TO FACE EVALUATION WAS PERFORMED  Clide Deutscher Jing Howatt 11/17/2021, 9:28 AM

## 2021-11-17 NOTE — Progress Notes (Signed)
Inpatient Rehabilitation Care Coordinator Assessment and Plan Patient Details  Name: Valerie Le MRN: [id number] Date of Birth: 23-Oct-1970  Today's Date: 11/17/2021  Hospital Problems: Principal Problem:   S/P BKA (below knee amputation), right [facility])  Past Medical History:  Past Medical History:  Diagnosis Date   Anemia    Asthma    as a child   Depression    Diabetes mellitus without complication (HCC)    Type 1 - Has Insulin Pump   Headache    migraines   High cholesterol    Hypertension    Past Surgical History:  Past Surgical History:  Procedure Laterality Date   AMPUTATION Right 12/22/2020   Procedure: RIGHT TRANSMETATARSAL AMPUTATION;  Surgeon: [name], MD;  Location: [facility] OR;  Service: Orthopedics;  Laterality: Right;   AMPUTATION Right 06/29/2021   Procedure: REVISION RIGHT TRANSMETATARSAL AMPUTATION;  Surgeon: [name], MD;  Location: [facility] OR;  Service: Orthopedics;  Laterality: Right;   AMPUTATION Right 11/11/2021   Procedure: RIGHT BELOW KNEE AMPUTATION;  Surgeon: [name], MD;  Location[facility] OR;  Service: Orthopedics;  Laterality: Right;   AMPUTATION TOE Right 12/04/2020   Procedure: AMPUTATION RIGHT SECOND TOE, I&D FOOT;  Surgeon: Eldred Manges, MD;  Location: [facility] ORS;  Service: Orthopedics;  Laterality: Right;  Right second toe amputation, I&D right foot.   APPLICATION OF WOUND VAC  11/11/2021   Procedure: APPLICATION OF WOUND VAC;  Surgeon: [name], MD;  Location: [facility] OR;  Service: Orthopedics;;   birth control implant  07/31/2006   Essure Implant   TONSILLECTOMY     TYMPANOSTOMY TUBE PLACEMENT     WISDOM TOOTH EXTRACTION     Social History:  reports that she has been smoking cigarettes. She has a 6.25 pack-year smoking history. She has never used smokeless tobacco. She reports that she does not currently use alcohol. She reports that she does not currently use drugs.  Family / Support Systems Marital Status: Married Patient  Roles: Spouse, Parent, Other (Comment) (employee) Spouse/Significant Other: [phone] Children: Two teenagers in school Other Supports: Friends and co-workers Anticipated Caregiver: husband Ability/Limitations of Caregiver: Husband is disabled due to a past CVA but moves well and can assist pt with supervision-light min assist Caregiver Availability: 24/7 Family Dynamics: Close knit with family who will pull together to assist pt. Pt is very independent and plans on taking care of herself if possible by discharge  Social History Preferred language: English Religion: Mormon Cultural Background: No issues Education: Freight forwarder - How often do you need to have someone help you when you read instructions, pamphlets, or other written material from your doctor or pharmacy?: Never Writes: Yes Employment Status: Employed Name of Employer: [employer] Return to Work Plans: Hopes to return once cleared by MD Marine scientist Issues: No issues Guardian/Conservator: None-according to MD pt is capable of making her own decisions while here   Abuse/Neglect Abuse/Neglect Assessment Can Be Completed: Yes Physical Abuse: Denies Verbal Abuse: Denies Sexual Abuse: Denies Exploitation of patient/patient's resources: Denies Self-Neglect: Denies  Patient response to: Social Isolation - How often do you feel lonely or isolated from those around you?: Sometimes  Emotional Status Pt's affect, behavior and adjustment status: Pt is motivated to do well and recover from her amputation. She reports there was no way to safe her leg and it was what needed to be done. She has always been independent and taken care of others she hopes  to do well here Recent Psychosocial Issues: other health issues Psychiatric History: no history pa may benefit from seeing neuro-psych while here due to young age and loss of limb. 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 Substance Abuse History: No issues  Patient / Family Perceptions, Expectations & Goals Pt/Family understanding of illness & functional limitations: Pt is able to explain her amputation and deficits as a result of this. She does talk with the MD and feels she has a good understanding of her treatment plan moving forward. Premorbid pt/family roles/activities: Wife, mom, employee, friend, church member, etc Anticipated changes in roles/activities/participation: resume Pt/family expectations/goals: Pt states: " I hope to be able to take care of myself when I go home. I'm not used to asking others for help."  [employer]: None Premorbid Home Care/DME Agencies: Other (Comment) (quad cane) Transportation available at discharge: self husband drives also Is the patient able to respond to transportation needs?: Yes In the past 12 months, has lack of transportation kept you from medical appointments or from getting medications?: No In the past 12 months, has lack of transportation kept you from meetings, work, or from getting things needed for daily living?: No Resource referrals recommended: Neuropsychology  Discharge Planning Living Arrangements: Spouse/significant other, Children Support Systems: Spouse/significant other, Children, Friends/neighbors, Church/faith community Type of Residence: Private residence Insurance Resources: Media planner (specify) Counselling psychologist) Financial Resources: Employment, Garment/textile technologist Screen Referred: No Living Expenses: Psychologist, sport and exercise Management: Patient, Spouse Does the patient have any problems obtaining your medications?: No Home Management: Husband Patient/Family Preliminary Plans: Return home with hsband and children-her husband can be there he is disabled he is disabled from a past CVA. Their children are in school during the day. Aware being evaluated today and will work on  discharge needs. Care Coordinator Barriers to Discharge: Insurance for SNF coverage Care Coordinator Anticipated Follow Up Needs: HH/OP, Support Group  Clinical Impression Pleasant female who is motivated to do well and become independent again and hopefully go back to work when cleared by MD. Being evaluated today and goals being set. Will work on discharge needs and place on neuro-psych list to be seen  Lucy Chris 11/17/2021, 9:44 AM

## 2021-11-17 NOTE — Progress Notes (Signed)
Inpatient Rehabilitation  Patient information reviewed and entered into eRehab system by Johnmatthew Solorio Delshawn Stech, OTR/L.   Information including medical coding, functional ability and quality indicators will be reviewed and updated through discharge.    

## 2021-11-17 NOTE — Evaluation (Signed)
Occupational Therapy Assessment and Plan  Patient Details  Name: Valerie Le MRN: [id number] Date of Birth: [birth year]  OT Diagnosis: acute pain, muscle weakness (generalized), and swelling of limb Rehab Potential: Rehab Potential (ACUTE ONLY): Good ELOS: 2.5 weeks   Today's Date: 11/17/2021 OT Individual Time: 0945-1100 OT Individual Time Calculation (min): 75 min     Hospital Problem: Principal Problem:   S/P BKA (below knee amputation), right ([place])   Past Medical History:  Past Medical History:  Diagnosis Date   Anemia    Asthma    as a child   Depression    Diabetes mellitus without complication ([place])    Type 1 - Has Insulin Pump   Headache    migraines   High cholesterol    Hypertension    Past Surgical History:  Past Surgical History:  Procedure Laterality Date   AMPUTATION Right 12/22/2020   Procedure: RIGHT TRANSMETATARSAL AMPUTATION;  Surgeon: Valerie Minion, MD;  Location: [place];  Service: Orthopedics;  Laterality: Right;   AMPUTATION Right 06/29/2021   Procedure: REVISION RIGHT TRANSMETATARSAL AMPUTATION;  Surgeon: Valerie Minion, MD;  Location: [place];  Service: Orthopedics;  Laterality: Right;   AMPUTATION Right 11/11/2021   Procedure: RIGHT BELOW KNEE AMPUTATION;  Surgeon: Valerie Minion, MD;  Location: [place];  Service: Orthopedics;  Laterality: Right;   AMPUTATION TOE Right 12/04/2020   Procedure: AMPUTATION RIGHT SECOND TOE, I&D FOOT;  Surgeon: Valerie Killings, MD;  Location: [facility];  Service: Orthopedics;  Laterality: Right;  Right second toe amputation, I&D right foot.   APPLICATION OF WOUND VAC  11/11/2021   Procedure: APPLICATION OF WOUND VAC;  Surgeon: Valerie Minion, MD;  Location: [place];  Service: Orthopedics;;   birth control implant  07/31/2006   Essure Implant   TONSILLECTOMY     TYMPANOSTOMY TUBE PLACEMENT     WISDOM TOOTH EXTRACTION      Assessment & Plan Clinical Impression: Valerie Le is a 51 year old female with a  history of diabetes mellitus type 1 status post right transmetatarsal amputation recently evaluated by Dr. Sharol Le for ongoing chronic draining wound.  She was receiving oral Augmentin.  She presented to the emergency department on 11/02/2021 with complaints of intermittent chest pain.  This was accompanied by nausea and vomiting.  She denied fever or shortness of breath.  Laboratory work-up revealed downtrending troponin, leukocytosis.  EKG without significant ischemic changes.  Chest pain resolved with aspirin and nitroglycerin.  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  She was admitted and placed on Rocephin and itraconazole.  MRI of the right foot revealed soft tissue ulceration around the base of the fifth metatarsal with soft tissue emphysema and inflammatory changes consistent with soft tissue infection.  Orthopedic surgery consulted and she had a R BKA with application of wound vac completed on 11/11/21.  She did have a fall on 5/27 without significant injury on 5/27 when trying to get to the bathroom..  Patient transferred to CIR on 11/16/2021 .    Patient currently requires min with basic self-care skills secondary to muscle weakness, decreased cardiorespiratoy endurance, decreased coordination, and decreased sitting balance, decreased standing balance, decreased postural control, decreased balance strategies, and difficulty maintaining precautions.  Prior to hospitalization, patient could complete ADLs and IADLs with independent .  Patient will benefit from skilled intervention to increase independence with basic self-care skills prior to discharge home with care partner.  Anticipate patient will require intermittent supervision  and follow up home health.  OT - End of Session Activity Tolerance: Tolerates 30+ min activity with multiple rests Endurance Deficit: Yes Endurance Deficit Description: pt fatigues quickly with standing/ambulating mobility  tasks requiring seated rest breaks OT Assessment Rehab Potential (ACUTE ONLY): Good OT Barriers to Discharge: Inaccessible home environment;Decreased caregiver support;Weight;Weight bearing restrictions OT Patient demonstrates impairments in the following area(s): Balance;Safety;Sensory;Edema;Skin Integrity;Endurance;Motor;Pain OT Basic ADL's Functional Problem(s): Bathing;Dressing;Toileting OT Advanced ADL's Functional Problem(s): Simple Meal Preparation;Laundry;Light Housekeeping OT Transfers Functional Problem(s): Toilet;Tub/Shower OT Additional Impairment(s): Fuctional Use of Upper Extremity OT Plan OT Intensity: Minimum of 1-2 x/day, 45 to 90 minutes OT Frequency: 5 out of 7 days OT Duration/Estimated Length of Stay: 2.5 weeks OT Treatment/Interventions: Discharge planning;Balance/vestibular training;Functional electrical stimulation;Pain management;Therapeutic Activities;Self Care/advanced ADL retraining;UE/LE Coordination activities;Visual/perceptual remediation/compensation;Therapeutic Exercise;Skin care/wound managment;Patient/family education;Functional mobility training;Disease mangement/prevention;Cognitive remediation/compensation;Community reintegration;DME/adaptive equipment instruction;Neuromuscular re-education;Psychosocial support;Splinting/orthotics;UE/LE Strength taining/ROM;Wheelchair propulsion/positioning OT Basic Self-Care Anticipated Outcome(s): mod I OT Toileting Anticipated Outcome(s): mod I OT Bathroom Transfers Anticipated Outcome(s): Mod I OT Recommendation Patient destination: Home Follow Up Recommendations: Home health OT Equipment Recommended: To be determined   OT Evaluation Precautions/Restrictions  Precautions Precautions: Fall;Other (comment) Precaution Comments: R LE wound vac to BKA Restrictions Weight Bearing Restrictions: Yes RLE Weight Bearing: Non weight bearing General Chart Reviewed: Yes Pain Pain Assessment Pain Scale: 0-10 Pain Score:  5  Pain Type: Acute pain;Other (Comment) (phantom Limb pain ("my big toe hurts")) Pain Orientation: Right Pain Descriptors / Indicators: Shooting Pain Onset: Other (Comment) (intermittent) Pain Intervention(s): RN made aware;Relaxation Multiple Pain Sites: No Home Living/Prior Functioning Home Living Family/patient expects to be discharged to:: Private residence Living Arrangements: Spouse/significant other, Children Available Help at Discharge: Family, Available 24 hours/day Type of Home: Apartment Home Access: Stairs to enter [employer] of Steps: 3 Entrance Stairs-Rails: Right, Left, Can reach both Home Layout: One level Bathroom Shower/Tub: Tub/shower unit  Lives With: Spouse, Son, Daughter, Other (Comment) (4 children (3 in highschool; 1 young adult); husband has recent Hx of CVA with Left Hemi weakness) IADL History Homemaking Responsibilities: Yes Current License: Yes Occupation: Full time employment Prior Function Level of Independence: Independent with transfers, Independent with gait, Independent with homemaking with ambulation, Independent with basic ADLs  Able to Take Stairs?: Yes Driving: Yes Vocation: Full time employment Vocation Requirements: Education officer, museum Vision Baseline Vision/History: 1 Wears glasses Ability to See in Adequate Light: 0 Adequate Patient Visual Report: No change from baseline Vision Assessment?: No apparent visual deficits Perception  Perception: Within Functional Limits Praxis Praxis: Intact Cognition Cognition Overall Cognitive Status: Within Functional Limits for tasks assessed Arousal/Alertness: Awake/alert Orientation Level: Person;Situation;Place Person: Oriented Place: Oriented Situation: Oriented Memory: Appears intact Attention: Focused;Sustained;Selective Focused Attention: Appears intact Sustained Attention: Appears intact Selective Attention: Appears intact Awareness: Appears intact Problem Solving: Appears  intact Safety/Judgment: Appears intact Brief Interview for Mental Status (BIMS) Repetition of Three Words (First Attempt): 3 Temporal Orientation: Year: Correct Temporal Orientation: Month: Accurate within 5 days Temporal Orientation: Day: Correct Recall: "Sock": Yes, no cue required Recall: "Blue": Yes, no cue required Recall: "Bed": Yes, no cue required BIMS Summary Score: 15 Sensation Sensation Light Touch: Impaired Detail Peripheral sensation comments: pt reports hx of peripheral neuropathy but is able to feel ligh touch Light Touch Impaired Details: Impaired LLE Hot/Cold: Appears Intact Proprioception: Appears Intact Stereognosis: Appears Intact Coordination Gross Motor Movements are Fluid and Coordinated: No Fine Motor Movements are Fluid and Coordinated: Yes Coordination and Movement Description: impaired due to generalized weakness and R LE BKA with NWBing restrictions  Motor  Motor Motor: Other (comment) Motor - Skilled Clinical Observations: generalized weakness with impaired balance due to R LE BKA with NWBing restrictions  Trunk/Postural Assessment  Cervical Assessment Cervical Assessment: Within Functional Limits Thoracic Assessment Thoracic Assessment: Within Functional Limits Lumbar Assessment Lumbar Assessment: Within Functional Limits Postural Control Postural Control: Deficits on evaluation Postural Limitations: impaired in standing requiring B UE support on AD and assist due to R LE NWBing restrictions  Balance Balance Balance Assessed: Yes Static Sitting Balance Static Sitting - Level of Assistance: 5: Stand by assistance Dynamic Sitting Balance Dynamic Sitting - Level of Assistance: 5: Stand by assistance Static Standing Balance Static Standing - Balance Support: During functional activity;Bilateral upper extremity supported Static Standing - Level of Assistance: 4: Min assist Dynamic Standing Balance Dynamic Standing - Balance Support: During  functional activity;Bilateral upper extremity supported Dynamic Standing - Level of Assistance: 3: Mod assist Extremity/Trunk Assessment RUE Assessment RUE Assessment: Exceptions to [facility] General Strength Comments: 4/5 MMT LUE Assessment LUE Assessment: Exceptions to [facility] General Strength Comments: 4/5 MMT  Care Tool Care Tool Self Care Eating   Eating Assist Level: Independent    Oral Care    Oral Care Assist Level: Minimal Assistance - Patient > 75%    Bathing         Assist Level: Moderate Assistance - Patient 50 - 74%    Upper Body Dressing(including orthotics)       Assist Level: Set up assist    Lower Body Dressing (excluding footwear)     Assist for lower body dressing: Maximal Assistance - Patient 25 - 49%    Putting on/Taking off footwear     Assist for footwear: Moderate Assistance - Patient 50 - 74%       Care Tool Toileting Toileting activity   Assist for toileting: Maximal Assistance - Patient 25 - 49%     Care Tool Bed Mobility Roll left and right activity   Roll left and right assist level: Supervision/Verbal cueing    Sit to lying activity   Sit to lying assist level: Contact Guard/Touching assist    Lying to sitting on side of bed activity   Lying to sitting on side of bed assist level: the ability to move from lying on the back to sitting on the side of the bed with no back support.: Contact Guard/Touching assist     Care Tool Transfers Sit to stand transfer   Sit to stand assist level: Minimal Assistance - Patient > 75% Sit to stand assistive device: Walker;Armrests  Chair/bed transfer   Chair/bed transfer assist level: Moderate Assistance - Patient 50 - 74% Chair/bed transfer assistive device: Armrests;Walker   Toilet transfer   Assist Level: Moderate Assistance - Patient 50 - 74%     Care Tool Cognition  Expression of Ideas and Wants Expression of Ideas and Wants: 4. Without difficulty (complex and basic) - expresses complex  messages without difficulty and with speech that is clear and easy to understand  Understanding Verbal and Non-Verbal Content Understanding Verbal and Non-Verbal Content: 4. Understands (complex and basic) - clear comprehension without cues or repetitions   Memory/Recall Ability Memory/Recall Ability : Current season;Location of own room;Staff names and faces;That he or she is in a hospital/hospital unit   Refer to Care Plan for [place] 1 OT Short Term Goal 1 (Week 1): Pt will complete toileting tasks with min assist OT Short Term Goal 2 (Week 1): Pt will complete stand  pivot to [facility] with CGA OT Short Term Goal 3 (Week 1): Pt will complete LB dressing with min assist OT Short Term Goal 4 (Week 1): Pt will exhibit dynamic standing with CGA with unilateral release UE.  Recommendations for other services: None    Skilled Therapeutic Intervention ADL ADL Grooming: Setup Where Assessed-Grooming: Sitting at sink Upper Body Bathing: Setup Where Assessed-Upper Body Bathing: Sitting at sink Lower Body Bathing: Moderate assistance Where Assessed-Lower Body Bathing: Standing at sink;Sitting at sink Upper Body Dressing: Setup Where Assessed-Upper Body Dressing: Sitting at sink Lower Body Dressing: Moderate assistance Where Assessed-Lower Body Dressing: Standing at sink;Sitting at sink Toileting: Moderate assistance Where Assessed-Toileting: Bedside Commode Toilet Transfer: Minimal assistance Toilet Transfer Method: Squat pivot Toilet Transfer Equipment: Extra wide bedside commode Mobility  Bed Mobility Bed Mobility: Supine to Sit;Sit to Supine Supine to Sit: Supervision/Verbal cueing Sit to Supine: Supervision/Verbal cueing Transfers Sit to Stand: Minimal Assistance - Patient > 75% Stand to Sit: Minimal Assistance - Patient > 75%  Skilled Intervention: Pt sitting up in w/c, Initial evaluation completed, collaborated with pt regarding OT POC.  Pt completed  self care and functional mobility per above levels of assist needed.  Requested to use bathroom after sinkside UB bathing and dressing and educated pt on safe squat pivot transfer technique to extra wide drop arm commode.  Pt had continent episode of urine and notified nurse of occurrence for charting.  Educated pt on use of lateral leans to increase independence during clothing management and LB dressing.  Pt having difficulty fully pulling up undewear over buttocks due to body habitus therefore attempted sit<>stand to complete donning of pants.  Pt requesting back to bed due to drowsiness.  Squat pivot to EOB and sit to supine.  Educated pt on importance of intermittent supine positioning to encourage lengthening of hip and knee flexors in prep for future prosthesis training.  Pt also educated on benefits of relaxation/mindfulness strategies to reduce phantom limb pain.  Pt reports familiarity with mindfulness and self initiated in supine at end of session.  Small folded blanket placed under distal residual limb to promote full knee extension in supine.  Call bell in reach, bed alarm on.   Discharge Criteria: Patient will be discharged from OT if patient refuses treatment 3 consecutive times without medical reason, if treatment goals not met, if there is a change in medical status, if patient makes no progress towards goals or if patient is discharged from hospital.  The above assessment, treatment plan, treatment alternatives and goals were discussed and mutually agreed upon: by patient  Ezekiel Slocumb 11/17/2021, 1:34 PM

## 2021-11-17 NOTE — Progress Notes (Signed)
Occupational Therapy Session Note  Patient Details  Name: Valerie Le MRN: [id number] Date of Birth: Jul 17, 1970  Today's Date: 11/17/2021 OT Individual Time: 1300-1400 OT Individual Time Calculation (min): 60 min    Short Term Goals: Week 1:  OT Short Term Goal 1 (Week 1): Pt will complete toileting tasks with min assist OT Short Term Goal 2 (Week 1): Pt will complete stand pivot to [facility] with CGA OT Short Term Goal 3 (Week 1): Pt will complete LB dressing with min assist OT Short Term Goal 4 (Week 1): Pt will exhibit dynamic standing with CGA with unilateral release UE.  Skilled Therapeutic Interventions/Progress Updates:  Discharge planning;Balance/vestibular training;Functional electrical stimulation;Pain management;Therapeutic Activities;Self Care/advanced ADL retraining;UE/LE Coordination activities;Visual/perceptual remediation/compensation;Therapeutic Exercise;Skin care/wound managment;Patient/family education;Functional mobility training;Disease mangement/prevention;Cognitive remediation/compensation;Community reintegration;DME/adaptive equipment instruction;Neuromuscular re-education;Psychosocial support;Splinting/orthotics;UE/LE Strength taining/ROM;Wheelchair propulsion/positioning   Pt asleep when arrived and had not eaten lunch yet. Pt came into sitting EOB with supervision with bed rail from flat bed. Pt provided with first Step magazine from [employer] - went over extensively about the healing process and progression towards using a prosthesis, mirror therapy, home setup (provided home measurement sheet - the current w/c she is in is 30 in wide rim to rim. ), promotion of healing. Squat pivot transfer to the w/c with min guard and education on how to don the limb guard. Pt left sitting up in the w/c with safety measures in place.   Therapy Documentation Precautions:  Precautions Precautions: Fall, Other (comment) Precaution Comments: R LE wound vac to  BKA Restrictions Weight Bearing Restrictions: Yes RLE Weight Bearing: Non weight bearing LLE Weight Bearing: Weight bearing as tolerated General: General Chart Reviewed: Yes Vital Signs: Therapy Vitals Temp: 97.6 F (36.4 C) Temp Source: Oral Pulse Rate: 87 Resp: 18 BP: 114/85 Patient Position (if appropriate): Sitting Oxygen Therapy SpO2: 97 % O2 Device: Room Air Pain: No pain in session  Other Treatments:     Therapy/Group: Individual Therapy  Roney Mans [facility] 11/17/2021, 4:14 PM

## 2021-11-17 NOTE — Progress Notes (Signed)
Inpatient Rehabilitation Center Individual Statement of Services  Patient Name:  Valerie Le  Date:  11/17/2021  Welcome to the Inpatient Rehabilitation Center.  Our goal is to provide you with an individualized program based on your diagnosis and situation, designed to meet your specific needs.  With this comprehensive rehabilitation program, you will be expected to participate in at least 3 hours of rehabilitation therapies Monday-Friday, with modified therapy programming on the weekends.  Your rehabilitation program will include the following services:  Physical Therapy (PT), Occupational Therapy (OT), 24 hour per day rehabilitation nursing, Therapeutic Recreaction (TR), Neuropsychology, Care Coordinator, Rehabilitation Medicine, Nutrition Services, and Pharmacy Services  Weekly team conferences will be held on Wednesday to discuss your progress.  Your Inpatient Rehabilitation Care Coordinator will talk with you frequently to get your input and to update you on team discussions.  Team conferences with you and your family in attendance may also be held.  Expected length of stay: 2-2.5 weeks  Overall anticipated outcome: independent with device and some supervision  Depending on your progress and recovery, your program may change. Your Inpatient Rehabilitation Care Coordinator will coordinate services and will keep you informed of any changes. Your Inpatient Rehabilitation Care Coordinator's name and contact numbers are listed  below.  The following services may also be recommended but are not provided by the Inpatient Rehabilitation Center:  Driving Evaluations Home Health Rehabiltiation Services Outpatient Rehabilitation Services Vocational Rehabilitation   Arrangements will be made to provide these services after discharge if needed.  Arrangements include referral to agencies that provide these services.  Your insurance has been verified to be:  [place] Your primary doctor is:   Maud Deed  Pertinent information will be shared with your doctor and your insurance company.  Inpatient Rehabilitation Care Coordinator:  Dossie Der, [place] [phone] or Luna Glasgow  Information discussed with and copy given to patient by: Lucy Chris, 11/17/2021, 9:48 AM

## 2021-11-18 LAB — GLUCOSE, CAPILLARY
Glucose-Capillary: 121 mg/dL — ABNORMAL HIGH (ref 70–99)
Glucose-Capillary: 155 mg/dL — ABNORMAL HIGH (ref 70–99)
Glucose-Capillary: 179 mg/dL — ABNORMAL HIGH (ref 70–99)
Glucose-Capillary: 141 mg/dL — ABNORMAL HIGH (ref 70–99)
Glucose-Capillary: 187 mg/dL — ABNORMAL HIGH (ref 70–99)

## 2021-11-18 MED ORDER — WHITE PETROLATUM EX OINT
TOPICAL_OINTMENT | CUTANEOUS | Status: DC | PRN
  Administered 2021-11-20: 1 via TOPICAL
  Filled 2021-11-18: qty 28.35

## 2021-11-18 MED ORDER — BLISTEX MEDICATED EX OINT: TOPICAL_OINTMENT | CUTANEOUS | Status: DC | PRN

## 2021-11-18 NOTE — Progress Notes (Signed)
Occupational Therapy Session Note  Patient Details  Name: Valerie Le MRN: [id number] Date of Birth: 04-12-1971  Today's Date: 11/18/2021 OT Individual [phone]; 1300-1400 OT Individual Time Calculation (min): 90 min and 60 min    Short Term Goals: Week 1:  OT Short Term Goal 1 (Week 1): Pt will complete toileting tasks with min assist OT Short Term Goal 2 (Week 1): Pt will complete stand pivot to [facility] with CGA OT Short Term Goal 3 (Week 1): Pt will complete LB dressing with min assist OT Short Term Goal 4 (Week 1): Pt will exhibit dynamic standing with CGA with unilateral release UE.  Skilled Therapeutic Interventions/Progress Updates:    First session:  Pt sitting up in w/c, reports she washed up but would like to change shirts. Time taken as needed to provide emotional support to pt during session as she expressed and processed her feelings of the events leading up to Right BKA event with pt somewhat tearful during session. Setup to doff/donn shirt and bra seated at w/c.  Pt c/o moderate soreness in right knee anterior/posterior after PT session and reports she just received Le medication a few minutes prior.  Pt self propelled to ortho gym~30 feet with supervision.  UBE x 15 minutes on level 2 resistance and maintaining PRE of 13. Therapeutic use of self and educated on benefits and different stress management strategies as well as importance of daily skin care and assessment non-affected LLE.  Built up right leg rest to promote knee extension at resting position. Pt trained on and participated in mirror therapy seated at w/c level x 20 minutes at rest then rolling medium weighted ball under foot. Transported back to room, call bell in reach. Nurse bringing seat alarm in to donn.  Recommended to CSW regarding peer support service for pt to promote psychosocial wellbeing.    Second Session: Pt sitting up in w/c, no c/o Le, agreeable to OT session.  Pt self propelled ~30 feet  to tub shower and educated using visual demo on safe stand pivot tub bench transfer.  Discussed bathroom layout as well for simulation accuracy.  Pt completed stand pivot and tub bench transfer with CGA to tub.  Pt attempted to complete stand pivot from shower bench to w/c however shower bench slightly too low to achieve full stance successfully therefore opted to completed squat pivot and able to do so with min assist.  Pt self propelled with BUE back to room and completed left foot hygiene and educated pt on use of long handled mirror for skin inspection.  Pt washed/dried/applied lotion and donned clean sock to left foot with setup sitting at sink.  Pt requesting back to bed and completed stand pivot using RW to EOB with CGA.  Sit to supine with independence. Call bell in reach, bed alarm on.  Therapy Documentation Precautions:  Precautions Precautions: Fall, Other (comment) Precaution Comments: R LE wound vac to BKA Restrictions Weight Bearing Restrictions: Yes RLE Weight Bearing: Non weight bearing LLE Weight Bearing: Weight bearing as tolerated    Therapy/Group: Individual Therapy  Amie Critchley 11/18/2021, 2:27 PM

## 2021-11-18 NOTE — Progress Notes (Signed)
PROGRESS NOTE   Subjective/Complaints: 1 week from amputation- wound vac to be removed today after therapy Blistex ordered for canker sore Vaseline ordered for lips  ROS: +pain in medial aspect of right knee, +canker sore   Objective:   No results found. Recent Labs    11/17/21 0520  WBC 14.7*  HGB 7.2*  HCT 22.7*  PLT 331   Recent Labs    11/17/21 0520  NA 137  K 3.6  CL 102  CO2 29  GLUCOSE 168*  BUN 30*  CREATININE 1.10*  CALCIUM 8.3*    Intake/Output Summary (Last 24 hours) at 11/18/2021 1551 Last data filed at 11/18/2021 1430 Gross per 24 hour  Intake 1020 ml  Output 0 ml  Net 1020 ml        Physical Exam: Vital Signs Blood pressure 119/65, pulse 89, temperature 98 F (36.7 C), temperature source Oral, resp. rate 16, height 5\' 11"  (1.803 m), weight 119.7 kg, SpO2 97 %. Gen: no distress, normal appearing HEENT: oral mucosa pink and moist, NCAT Cardio: Reg rate Chest: normal effort, normal rate of breathing Abd: soft, non-distended Ext: Right BKA wound VAC in place with good seal; no output in canister. Semirigid BKA protector in place She has insulin pump LUE Psych: Pt's affect is appropriate. Pt is cooperative. Pleasant. A little anxious Skin: Clean and intact without signs of breakdown Neuro:  Follows commands and answers questions. Speech normal. Sensation intact to LT in all 4 extremities. She reports slightly altered sensation in LLE (chronic due to DM) CN 2-12 intact Musculoskeletal: 5/5 in B/L UE and LLE strength. Able to move RLE at hip.  Normal tone and bulk. No joint swelling noted.  Functional mobility: Can don limb guard set-up.    Assessment/Plan: 1. Functional deficits which require 3+ hours per day of interdisciplinary therapy in a comprehensive inpatient rehab setting. Physiatrist is providing close team supervision and 24 hour management of active medical problems listed  below. Physiatrist and rehab team continue to assess barriers to discharge/monitor patient progress toward functional and medical goals  Care Tool:  Bathing              Bathing assist Assist Level: Moderate Assistance - Patient 50 - 74%     Upper Body Dressing/Undressing Upper body dressing   What is the patient wearing?: Dress    Upper body assist Assist Level: Set up assist    Lower Body Dressing/Undressing Lower body dressing            Lower body assist Assist for lower body dressing: Maximal Assistance - Patient 25 - 49%     Toileting Toileting    Toileting assist Assist for toileting: Maximal Assistance - Patient 25 - 49%     Transfers Chair/bed transfer  Transfers assist     Chair/bed transfer assist level: Moderate Assistance - Patient 50 - 74% Chair/bed transfer assistive device: Armrests,   Ambulation assist      Assist level: Minimal Assistance - Patient > 75% Assistive device: Walker-rolling Max distance: 47ft   Walk 10 feet activity   Assist  Walk 10 feet activity did not occur: Safety/medical  concerns        Walk 50 feet activity   Assist Walk 50 feet with 2 turns activity did not occur: Safety/medical concerns         Walk 150 feet activity   Assist Walk 150 feet activity did not occur: Safety/medical concerns         Walk 10 feet on uneven surface  activity   Assist Walk 10 feet on uneven surfaces activity did not occur: Safety/medical concerns         Wheelchair     Assist Is the patient using a wheelchair?: Yes Type of Wheelchair: Manual    Wheelchair assist level: Supervision/Verbal cueing Max wheelchair distance: 158ft    Wheelchair 50 feet with 2 turns activity    Assist        Assist Level: Supervision/Verbal cueing   Wheelchair 150 feet activity     Assist      Assist Level: Minimal Assistance - Patient > 75%   Blood pressure 119/65,  pulse 89, temperature 98 F (36.7 C), temperature source Oral, resp. rate 16, height 5\' 11"  (1.803 m), weight 119.7 kg, SpO2 97 %.    Medical Problem List and Plan: 1. 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              -patient may not shower due to wound vac             -ELOS/Goals: 7-10 days  -Remove wound vac today 2.  Antithrombotics: -DVT/anticoagulation:  Pharmaceutical: Lovenox             -antiplatelet therapy: Aspirin 81 mg 3. Pain along medial aspect of right knee: Voltaren gel ordered prn. Tylenol and oxycodone as needed, Neurontin 300 mg 3 times daily, amitriptyline 25 mg at bedtime>>migraine prevention             -Monitor for phantom pain and sensation. Provided with pain relief journal.  4. Mood: LCSW to evaluate and provide emotional support             -antipsychotic agents: n/a 5. Neuropsych: This patient is capable of making decisions on her own behalf. 6. Skin/Wound Care: Routine skin care checks             -- Plan VAC dressing removal Friday 6/2 7. Fluids/Electrolytes/Nutrition: Routine I's and O's and follow-up chemistries 8: DM on insulin pump: Continue current treatment with insulin pump, CBG with fair control, continue to monitor 9: Hypertension: Continue amlodipine and lisinopril -BP overall good control 10: Hyperlipidemia: continue Lipitor 12: Iron deficiency anemia and acute blood loss anemia: continue ferous sulfate and vitamin C. Last Hgb 7.2, repeat Hgb Monday 13: Tobacco use: continue Chantix at bedtime 14: History of migraine headache: none recently; Elavil at night for prevention 15: Constipation: give MoM now; other prn 16: Leukocytosis: improving, discussed with patient. Repeat WBC Monday.  17. Transaminitis: d/c Tylenol.  18. Canker sore: Blistex ordered 19. Dry lips: vaseline ordered.     LOS: 2 days A FACE TO FACE EVALUATION WAS PERFORMED  Saturday Kethan Papadopoulos 11/18/2021, 3:51 PM

## 2021-11-18 NOTE — Progress Notes (Signed)
Occupational Therapy Session Note  Patient Details  Name: Valerie Le MRN: [id number] Date of Birth: 1970/10/05  Today's Date: 11/18/2021 OT Individual Time: 1120-1200 OT Individual Time Calculation (min): 40 min    Short Term Goals: Week 1:  OT Short Term Goal 1 (Week 1): Pt will complete toileting tasks with min assist OT Short Term Goal 2 (Week 1): Pt will complete stand pivot to [facility] with CGA OT Short Term Goal 3 (Week 1): Pt will complete LB dressing with min assist OT Short Term Goal 4 (Week 1): Pt will exhibit dynamic standing with CGA with unilateral release UE.  Skilled Therapeutic Interventions/Progress Updates:  Pt greeted seated in w/c  agreeable to OT intervention. Session focus on dynamic standing balance and w/c mgmt.  Pt donned limb guard with set- up assist.  Pt completed w/c propulsion ~ 20 ft with supervision and MIN verbal cues for technique. Worked on dynamic standing for ADL particiaption with pt completing Sit>stand with bari RW and MIN A. Pt able to stand to complete connect four game using LUE with CGA for ~ 2 mins. Graded task up and had pt use LUE to pin clothespins to rod on R side with pt having to cross midline and reach slightly higher, pt completed task with CGA, pt did retrieve one clothespin with RUE but reports "that was scary." Pt completed ~ 10 ft of w/c propulsion back towards room, total A transport remainder of distance. Pt left seated in w/c with alarm belt activated, wound vac plugged in and all needs within reach.           Therapy Documentation Precautions:  Precautions Precautions: Fall, Other (comment) Precaution Comments: R LE wound vac to BKA Restrictions Weight Bearing Restrictions: Yes RLE Weight Bearing: Non weight bearing LLE Weight Bearing: Weight bearing as tolerated  Pain: 4/10 pain reported in RLE, repositioning and rest breaks provided as needed    Therapy/Group: Individual Therapy  [facility] 11/18/2021,  12:30 PM

## 2021-11-18 NOTE — Progress Notes (Signed)
Physical Therapy Session Note  Patient Details  Name: Valerie Le MRN: [id number] Date of Birth: [birth year]  Today's Date: 11/18/2021 PT Individual Time: 0810-0906 PT Individual Time Calculation (min): 56 min   Short Term Goals: Week 1:  PT Short Term Goal 1 (Week 1): Pt will perform sit<>stands using LRAD with CGA PT Short Term Goal 2 (Week 1): Pt will perform bed<>chair transfers using LRAD with CGA PT Short Term Goal 3 (Week 1): Pt will ambulate at least 16f using LRAD with CGA PT Short Term Goal 4 (Week 1): Pt will navigate up/down 2 steps using HRs with CGA PT Short Term Goal 5 (Week 1): Pt will propel w/c at least [id number] supervision   Skilled Therapeutic Interventions/Progress Updates:    Pt received sitting up in bed and agreeable to PT. Donning pants in bed with set up from PT. Bridge through LLE to pull pants to waist. Supine<>sit without assist from PT.  Pt performed stand pivot transfer with RW and min A*  from PT for safety.   WC mobility through hall x [id number] supervision assist and cues for symmetry of force on BUE to prevent veer to the R. Performed sit<>stand in parallel bars x 8 with supervision assist. Standing hip abduction, hip extension and knee flexion each performed 2 x 10. Seated LAQ 2 x 10 with 3 sec hold at end range, hip abduction and flexion with level 3 tband 2 x 12 with hold at end range.  Stepping in parallel bars forward reverse 2 steps x 6 with CGA for safety to perform each exercise in standing. [facility] returned to room and [facility] and all needs met.         Therapy Documentation Precautions:  Precautions Precautions: Fall, Other (comment) Precaution Comments: R LE wound vac to BKA Restrictions Weight Bearing Restrictions: Yes RLE Weight Bearing: Non weight bearing LLE Weight Bearing: Weight bearing as tolerated  Pain: Pain Assessment Pain Scale: 0-10 Pain Score: 2  Pain Type: Surgical  pain Pain Location: Leg Pain Orientation: Right Pain Descriptors / Indicators: Aching Pain Intervention(s): Ambulation/increased activity Multiple Pain Sites: No    Therapy/Group: Individual Therapy  AusLorie Phenix2/2023, 9:10 AM

## 2021-11-19 LAB — GLUCOSE, CAPILLARY
Glucose-Capillary: 143 mg/dL — ABNORMAL HIGH (ref 70–99)
Glucose-Capillary: 141 mg/dL — ABNORMAL HIGH (ref 70–99)
Glucose-Capillary: 161 mg/dL — ABNORMAL HIGH (ref 70–99)
Glucose-Capillary: 184 mg/dL — ABNORMAL HIGH (ref 70–99)
Glucose-Capillary: 163 mg/dL — ABNORMAL HIGH (ref 70–99)

## 2021-11-19 MED ORDER — BISACODYL 10 MG RE SUPP
10.0000 mg | Freq: Every day | RECTAL | Status: DC | PRN
Start: 2021-11-19 — End: 2021-11-25
  Administered 2021-11-19: 10 mg via RECTAL
  Filled 2021-11-19: qty 1

## 2021-11-19 MED ORDER — BISACODYL 10 MG RE SUPP: 10.0000 mg | Freq: Every day | RECTAL | Status: DC | PRN

## 2021-11-19 NOTE — Progress Notes (Signed)
PROGRESS NOTE   Subjective/Complaints: Sleepy this morning.  Wound vac removed with minimal bleeding yesterday Milk of mag given yesterday for constipation  ROS: +pain in medial aspect of right knee, +canker sore, +constipation   Objective:   No results found. Recent Labs    11/17/21 0520  WBC 14.7*  HGB 7.2*  HCT 22.7*  PLT 331   Recent Labs    11/17/21 0520  NA 137  K 3.6  CL 102  CO2 29  GLUCOSE 168*  BUN 30*  CREATININE 1.10*  CALCIUM 8.3*    Intake/Output Summary (Last 24 hours) at 11/19/2021 1310 Last data filed at 11/18/2021 2100 Gross per 24 hour  Intake 1080 ml  Output --  Net 1080 ml        Physical Exam: Vital Signs Blood pressure 131/69, pulse 86, temperature 98.3 F (36.8 C), temperature source Oral, resp. rate 17, height 5\' 11"  (1.803 m), weight 119.7 kg, SpO2 97 %. Gen: no distress, normal appearing, BMI 36.81 HEENT: oral mucosa pink and moist, NCAT Cardio: Reg rate Chest: normal effort, normal rate of breathing Abd: soft, non-distended Ext: Right BKA wound VAC in place with good seal; no output in canister. Semirigid BKA protector in place She has insulin pump LUE Psych: Pt's affect is appropriate. Pt is cooperative. Pleasant. A little anxious Skin: Clean and intact without signs of breakdown Neuro:  Follows commands and answers questions. Speech normal. Sensation intact to LT in all 4 extremities. She reports slightly altered sensation in LLE (chronic due to DM) CN 2-12 intact Musculoskeletal: 5/5 in B/L UE and LLE strength. Able to move RLE at hip.  Normal tone and bulk. No joint swelling noted.  Functional mobility: Can don limb guard set-up.    Assessment/Plan: 1. Functional deficits which require 3+ hours per day of interdisciplinary therapy in a comprehensive inpatient rehab setting. Physiatrist is providing close team supervision and 24 hour management of active medical  problems listed below. Physiatrist and rehab team continue to assess barriers to discharge/monitor patient progress toward functional and medical goals  Care Tool:  Bathing              Bathing assist Assist Level: Moderate Assistance - Patient 50 - 74%     Upper Body Dressing/Undressing Upper body dressing   What is the patient wearing?: Pull over shirt    Upper body assist Assist Level: Independent    Lower Body Dressing/Undressing Lower body dressing            Lower body assist Assist for lower body dressing: Maximal Assistance - Patient 25 - 49%     Toileting Toileting    Toileting assist Assist for toileting: Moderate Assistance - Patient 50 - 74%     Transfers Chair/bed transfer  Transfers assist     Chair/bed transfer assist level: Minimal Assistance - Patient > 75% Chair/bed transfer assistive device:   Ambulation assist      Assist level: Minimal Assistance - Patient > 75% Assistive device: Walker-rolling Max distance: 89ft   Walk 10 feet activity   Assist  Walk 10 feet activity did not occur: Safety/medical concerns  Walk 50 feet activity   Assist Walk 50 feet with 2 turns activity did not occur: Safety/medical concerns         Walk 150 feet activity   Assist Walk 150 feet activity did not occur: Safety/medical concerns         Walk 10 feet on uneven surface  activity   Assist Walk 10 feet on uneven surfaces activity did not occur: Safety/medical concerns         Wheelchair     Assist Is the patient using a wheelchair?: Yes Type of Wheelchair: Manual    Wheelchair assist level: Supervision/Verbal cueing Max wheelchair distance: 175ft    Wheelchair 50 feet with 2 turns activity    Assist        Assist Level: Supervision/Verbal cueing   Wheelchair 150 feet activity     Assist      Assist Level: Minimal Assistance - Patient > 75%   Blood  pressure 131/69, pulse 86, temperature 98.3 F (36.8 C), temperature source Oral, resp. rate 17, height 5\' 11"  (1.803 m), weight 119.7 kg, SpO2 97 %.    Medical Problem List and Plan: 1. 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              -patient may not shower due to wound vac             -ELOS/Goals: 7-10 days  -Wound vac removed without complication.  2.  Antithrombotics: -DVT/anticoagulation:  Pharmaceutical: Lovenox             -antiplatelet therapy: Aspirin 81 mg 3. Pain along medial aspect of right knee: Voltaren gel ordered prn. Tylenol and oxycodone as needed, Neurontin 300 mg 3 times daily, amitriptyline 25 mg at bedtime>>migraine prevention             -Monitor for phantom pain and sensation. Provided with pain relief journal.  4. Mood: LCSW to evaluate and provide emotional support             -antipsychotic agents: n/a 5. Neuropsych: This patient is capable of making decisions on her own behalf. 6. Skin/Wound Care: Routine skin care checks             -- Plan VAC dressing removal Friday 6/2 7. Fluids/Electrolytes/Nutrition: Routine I's and O's and follow-up chemistries 8: DM on insulin pump: Continue current treatment with insulin pump, CBG with fair control, continue to monitor 9: Hypertension: Continue amlodipine and lisinopril -BP overall good control 10: Hyperlipidemia: continue Lipitor 12: Iron deficiency anemia and acute blood loss anemia: continue ferous sulfate and vitamin C. Last Hgb 7.2, repeat Hgb Monday 13: Tobacco use: continue Chantix at bedtime 14: History of migraine headache: none recently; Elavil at night for prevention 15: Obesity BMI 36.81: provide dietary education 16: Leukocytosis: improving, discussed with patient. Repeat WBC Monday.  17. Transaminitis: d/c Tylenol.  18. Canker sore: Blistex ordered 19. Dry lips: vaseline ordered.  20. Constipation: milk of mag prn ordered.      LOS: 3 days A  FACE TO FACE EVALUATION WAS PERFORMED  Yeshaya Vath P Jamaris Biernat 11/19/2021, 1:10 PM

## 2021-11-19 NOTE — IPOC Note (Signed)
Overall Plan of Care [facility]) Patient Details Name: Valerie Le MRN: [id number] DOB: 03/17/1971  Admitting Diagnosis: S/P BKA (below knee amputation), right [facility])  Hospital Problems: Principal Problem:   S/P BKA (below knee amputation), right (HCC)     Functional Problem List: Nursing Bowel, Safety, Pain, Endurance, Skin Integrity  PT Balance, Safety, Sensory, Edema, Endurance, Skin Integrity, Motor, Nutrition, Pain  OT Balance, Safety, Sensory, Edema, Skin Integrity, Endurance, Motor, Pain  SLP    TR         Basic ADL's: OT Bathing, Dressing, Toileting     Advanced  ADL's: OT Simple Meal Preparation, Laundry, Light Housekeeping     Transfers: PT Bed Mobility, Bed to Chair, Car, Occupational psychologist, Research scientist (life sciences): PT Ambulation, Psychologist, prison and probation services, Stairs     Additional Impairments: OT Fuctional Use of Upper Extremity  SLP        TR      Anticipated Outcomes Item Anticipated Outcome  Self Feeding    Swallowing      Basic self-care  mod I  Toileting  mod I   Bathroom Transfers Mod I  Bowel/Bladder  manage bowel w mod I assist  Transfers  supervision using LRAD  Locomotion  supervision using LRAD  Communication     Cognition     Pain  pain at or below level 4 with prns  Safety/Judgment  manage safety w cues   Therapy Plan: PT Intensity: Minimum of 1-2 x/day ,45 to 90 minutes PT Frequency: 5 out of 7 days PT Duration Estimated Length of Stay: ~2-2.5 weeks OT Intensity: Minimum of 1-2 x/day, 45 to 90 minutes OT Frequency: 5 out of 7 days OT Duration/Estimated Length of Stay: 2.5 weeks     Team Interventions: Nursing Interventions Disease Management/Prevention, Medication Management, Discharge Planning, Skin Care/Wound Management, Pain Management, Bowel Management, Patient/Family Education  PT interventions Ambulation/gait training, Community reintegration, DME/adaptive equipment instruction, Neuromuscular re-education,  Psychosocial support, Museum/gallery curator, Wheelchair propulsion/positioning, UE/LE Strength taining/ROM, Warden/ranger, Discharge planning, Pain management, Skin care/wound management, Therapeutic Activities, UE/LE Coordination activities, Cognitive remediation/compensation, Disease management/prevention, Functional mobility training, Patient/family education, Splinting/orthotics, Therapeutic Exercise, Visual/perceptual remediation/compensation  OT Interventions Discharge planning, Balance/vestibular training, Functional electrical stimulation, Pain management, Therapeutic Activities, Self Care/advanced ADL retraining, UE/LE Coordination activities, Visual/perceptual remediation/compensation, Therapeutic Exercise, Skin care/wound managment, Patient/family education, Functional mobility training, Disease mangement/prevention, Cognitive remediation/compensation, Firefighter, Fish farm manager, Neuromuscular re-education, Psychosocial support, Splinting/orthotics, UE/LE Strength taining/ROM, Wheelchair propulsion/positioning  SLP Interventions    TR Interventions    SW/CM Interventions Discharge Planning, Psychosocial Support, Patient/Family Education   Barriers to Discharge MD  Medical stability  Nursing Decreased caregiver support, Home environment access/layout 1 level 3 ste bil rails w spouse  PT Inaccessible home environment, Decreased caregiver support, Home environment access/layout, Lack of/limited family support, Weight bearing restrictions    OT Inaccessible home environment, Decreased caregiver support, Weight, Weight bearing restrictions    SLP      SW Insurance for SNF coverage     Team Discharge Planning: Destination: PT-Home ,OT- Home , SLP-  Projected Follow-up: PT-Home health PT, 24 hour supervision/assistance, OT-  Home health OT, SLP-  Projected Equipment Needs: PT-To be determined, OT- To be determined, SLP-  Equipment Details: PT- , OT-   Patient/family involved in discharge planning: PT- Patient,  OT-Patient, SLP-   MD ELOS: 7-10 days Medical Rehab Prognosis:  Excellent Assessment: The patient has been admitted for CIR therapies with the diagnosis of rght BKA. The team  will be addressing functional mobility, strength, stamina, balance, safety, adaptive techniques and equipment, self-care, bowel and bladder mgt, patient and caregiver education. Goals have been set at supervision. Anticipated discharge destination is home.        See Team Conference Notes for weekly updates to the plan of care

## 2021-11-20 LAB — GLUCOSE, CAPILLARY
Glucose-Capillary: 134 mg/dL — ABNORMAL HIGH (ref 70–99)
Glucose-Capillary: 147 mg/dL — ABNORMAL HIGH (ref 70–99)
Glucose-Capillary: 153 mg/dL — ABNORMAL HIGH (ref 70–99)
Glucose-Capillary: 190 mg/dL — ABNORMAL HIGH (ref 70–99)
Glucose-Capillary: 131 mg/dL — ABNORMAL HIGH (ref 70–99)

## 2021-11-20 NOTE — Progress Notes (Addendum)
Physical Therapy Session Note  Patient Details  Name: Valerie Le MRN: [id number] Date of Birth: [birth year]  Today's Date: 11/20/2021 PT Individual Time: [id number] and 1003- 1113 PT Individual Time Calculation (min): 57 min and 70 min  Short Term Goals: Week 1:  PT Short Term Goal 1 (Week 1): Pt will perform sit<>stands using LRAD with CGA PT Short Term Goal 2 (Week 1): Pt will perform bed<>chair transfers using LRAD with CGA PT Short Term Goal 3 (Week 1): Pt will ambulate at least 47ft using LRAD with CGA PT Short Term Goal 4 (Week 1): Pt will navigate up/down 2 steps using HRs with CGA PT Short Term Goal 5 (Week 1): Pt will propel w/c at least 152ft witih supervision  Skilled Therapeutic Interventions/Progress Updates:   SESSION ONE Denies pain  Doffs pants and dons pants w/set up.    Therex: Quad sets x 15 Sidleying hip abd, hip extension x 20 each Prone lying x 10 min Prone hs curls x 20 Prone hip extension x 15 Seated LAQs. X 20  stand pivot transfer to wc w/RW from elevated bed w/cga. Dons shoe w/set up only.  Education: Discussed  *purpose of preprosthetic exercises *process of limb volume stabilization and shaping w/shrinker  Wc skills: Pt educated and practiced parts management Practiced technique for tight turns  Gait:  53ft w/RW w/cga and wc follow, cues for offloading to decrease impact forces and shear L limb.  Wc to recliner w/RW and cga, min assist w/lowering due to decreased eccentric control. Limb guard donned by therapist w/Les elevated.       Session Two: Pt oob in recliner.   Attempted Sit to stand but unable from low surface.  +2 assist from nursing, stand pivot transfer to wc w/mod assist for power up.  Wc propulsion 13ft w/bilat Ues.  sliding board transfer training wc to mat w/set up and cues only. Sit to stand from elevated mat w/cga.  Dynamic/functional balance: Standing w/RW worked on transferring horseshoes from  table on L to wc on R x 6 w/cga to light min assist for steadying, after seated rest break repeated R to L w/min assist, slowed pace, decreased percieved stability, good safety awareness. Repeated activity x 2 each following seated rest.  Repeated Sit to stand from elevated mat x 5 reps. Seated rest then repeated x 7 reps.  Gait- 6 ft including turn/back to wc w/cga, cues for push and sway/controlled impact LLE.  Wc propulsion gym to room mod I  stand pivot transfer wc to bed w/RW and cga.  Sit to supine w/supervision. Pt left supine w/rails up x 3, alarm set, bed in lowest position, and needs in reach.    Therapy Documentation Precautions:  Precautions Precautions: Fall, Other (comment) Precaution Comments: R LE wound vac to BKA Restrictions Weight Bearing Restrictions: Yes RLE Weight Bearing: Non weight bearing LLE Weight Bearing: Weight bearing as tolerated    Therapy/Group: Individual Therapy Callie Fielding, PT   Jerrilyn Cairo 11/20/2021, 9:01 AM

## 2021-11-20 NOTE — Progress Notes (Signed)
Occupational Therapy Session Note  Patient Details  Name: Valerie Le MRN: [id number] Date of Birth: [birth year]  Today's Date: 11/20/2021 OT Individual Time: 1400-1500 OT Individual Time Calculation (min): 60 min    Short Term Goals: Week 1:  OT Short Term Goal 1 (Week 1): Pt will complete toileting tasks with min assist OT Short Term Goal 2 (Week 1): Pt will complete stand pivot to [facility] with CGA OT Short Term Goal 3 (Week 1): Pt will complete LB dressing with min assist OT Short Term Goal 4 (Week 1): Pt will exhibit dynamic standing with CGA with unilateral release UE. Week 2:     Skilled Therapeutic Interventions/Progress Updates:    1:1 Pt received in the bed and able to come to EOB with modI with bed rails. Pt transferred with the RW stand pivot into the w/c and was able to maintained standing balance to doff paper pants and underwear. Pt transferred stand pivot into the shower to the tub bench and was able to bathe all parts with lateral leans. Pt ambulated from tub bench to toilet with side BSC over it to void. Donned gown and underwear with setup and min A for standing balance to pull them up.  Ambulated from toilet to EOB ~ 10-12 feet with RW with min A with extra time and cues for RW management. Able to get into bed supine with mod I. Left in bed to rest with safety measures in place.   Therapy Documentation Precautions:  Precautions Precautions: Fall, Other (comment) Precaution Comments: R LE wound vac to BKA Restrictions Weight Bearing Restrictions: Yes RLE Weight Bearing: Non weight bearing LLE Weight Bearing: Weight bearing as tolerated General:   Vital Signs: Therapy Vitals Temp: 98.6 F (37 C) Temp Source: Oral Pulse Rate: 87 Resp: 16 BP: 130/73 Patient Position (if appropriate): Lying Oxygen Therapy SpO2: 100 % O2 Device: Room Air Pain:  Pt reports she bumped her residual limb with her other foot and it is slightly sore- RN came and looked.Pt  able to continue with therapy  Therapy/Group: Individual Therapy  Roney Mans [facility] 11/20/2021, 3:59 PM

## 2021-11-20 NOTE — Progress Notes (Signed)
PROGRESS NOTE   Subjective/Complaints: Working on prone exercises on bed now.  Moving bowels well after milk of magnesia yesterday Tolerated removal of wound vac well  ROS: +pain in medial aspect of right knee, +canker sore- still dry, +constipation- resolved   Objective:   No results found. No results for input(s): WBC, HGB, HCT, PLT in the last 72 hours.  No results for input(s): NA, K, CL, CO2, GLUCOSE, BUN, CREATININE, CALCIUM in the last 72 hours.   Intake/Output Summary (Last 24 hours) at 11/20/2021 1903 Last data filed at 11/20/2021 1348 Gross per 24 hour  Intake 720 ml  Output --  Net 720 ml        Physical Exam: Vital Signs Blood pressure 130/73, pulse 87, temperature 98.6 F (37 C), temperature source Oral, resp. rate 16, height 5\' 11"  (1.803 m), weight 119.7 kg, SpO2 100 %. Gen: no distress, normal appearing, BMI 36.81 HEENT: oral mucosa pink and moist, NCAT Cardio: Reg rate Chest: normal effort, normal rate of breathing Abd: soft, non-distended Ext: Right BKA wound VAC in place with good seal; no output in canister. Semirigid BKA protector in place She has insulin pump LUE Psych: Pt's affect is appropriate. Pt is cooperative. Pleasant. A little anxious Skin: Clean and intact without signs of breakdown Neuro:  Follows commands and answers questions. Speech normal. Sensation intact to LT in all 4 extremities. She reports slightly altered sensation in LLE (chronic due to DM) CN 2-12 intact Musculoskeletal: 5/5 in B/L UE and LLE strength. Able to move RLE at hip.  Normal tone and bulk. No joint swelling noted.  Functional mobility: Can don limb guard set-up. Able to perform prone exercises   Assessment/Plan: 1. Functional deficits which require 3+ hours per day of interdisciplinary therapy in a comprehensive inpatient rehab setting. Physiatrist is providing close team supervision and 24 hour management of  active medical problems listed below. Physiatrist and rehab team continue to assess barriers to discharge/monitor patient progress toward functional and medical goals  Care Tool:  Bathing    Body parts bathed by patient: Right arm, Left upper leg, Left arm, Chest, Abdomen, Left lower leg, Front perineal area, Face, Buttocks, Right upper leg     Body parts n/a: Right lower leg   Bathing assist Assist Level: Supervision/Verbal cueing     Upper Body Dressing/Undressing Upper body dressing   What is the patient wearing?: Pull over shirt    Upper body assist Assist Level: Independent    Lower Body Dressing/Undressing Lower body dressing      What is the patient wearing?: Underwear/pull up     Lower body assist Assist for lower body dressing: Minimal Assistance - Patient > 75%     Toileting Toileting    Toileting assist Assist for toileting: Contact Guard/Touching assist     Transfers Chair/bed transfer  Transfers assist     Chair/bed transfer assist level: Minimal Assistance - Patient > 75% Chair/bed transfer assistive device:   Ambulation assist      Assist level: Minimal Assistance - Patient > 75% Assistive device: Walker-rolling Max distance: 46ft   Walk 10 feet activity   Assist  Walk 10 feet activity did not occur: Safety/medical concerns        Walk 50 feet activity   Assist Walk 50 feet with 2 turns activity did not occur: Safety/medical concerns         Walk 150 feet activity   Assist Walk 150 feet activity did not occur: Safety/medical concerns         Walk 10 feet on uneven surface  activity   Assist Walk 10 feet on uneven surfaces activity did not occur: Safety/medical concerns         Wheelchair     Assist Is the patient using a wheelchair?: Yes Type of Wheelchair: Manual    Wheelchair assist level: Supervision/Verbal cueing Max wheelchair distance: 141ft    Wheelchair 50  feet with 2 turns activity    Assist        Assist Level: Supervision/Verbal cueing   Wheelchair 150 feet activity     Assist      Assist Level: Minimal Assistance - Patient > 75%   Blood pressure 130/73, pulse 87, temperature 98.6 F (37 C), temperature source Oral, resp. rate 16, height 5\' 11"  (1.803 m), weight 119.7 kg, SpO2 100 %.    Medical Problem List and Plan: 1. 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              -patient may not shower due to wound vac             -ELOS/Goals: 7-10 days  -Wound vac removed without complication.   Continue CIR 2.  Impaired mobility: continue Lovenox             -antiplatelet therapy: Aspirin 81 mg 3. Pain along medial aspect of right knee: continue Voltaren gel ordered prn. Tylenol and oxycodone as needed, Neurontin 300 mg 3 times daily, amitriptyline 25 mg at bedtime>>migraine prevention             -Monitor for phantom pain and sensation. Provided with pain relief journal.  4. Mood: LCSW to evaluate and provide emotional support             -antipsychotic agents: n/a 5. Neuropsych: This patient is capable of making decisions on her own behalf. 6. Skin/Wound Care: Routine skin care checks 7. Fluids/Electrolytes/Nutrition: Routine I's and O's and follow-up chemistries 8: DM on insulin pump: Continue current treatment with insulin pump, CBG with fair control, continue to monitor 9: Hypertension: Continue amlodipine and lisinopril -BP overall good control 10: Hyperlipidemia: continue Lipitor 12: Iron deficiency anemia and acute blood loss anemia: continue ferous sulfate and vitamin C. Last Hgb 7.2, repeat Hgb Monday 13: Tobacco use: continue Chantix at bedtime 14: History of migraine headache: none recently; Elavil at night for prevention 15: Obesity BMI 36.81: provide dietary education 16: Leukocytosis: improving, discussed with patient. Repeat WBC Monday.  17. Transaminitis:  d/c Tylenol.  18. Canker sore: Blistex ordered 19. Dry lips: vaseline ordered.  20. Constipation: milk of mag prn ordered.      LOS: 4 days A FACE TO FACE EVALUATION WAS PERFORMED  Saturday P Tashi Andujo 11/20/2021, 7:03 PM

## 2021-11-21 ENCOUNTER — Telehealth: Payer: Self-pay | Admitting: Orthopedic Surgery

## 2021-11-21 DIAGNOSIS — D509 Iron deficiency anemia, unspecified: Secondary | ICD-10-CM

## 2021-11-21 LAB — BASIC METABOLIC PANEL
Anion gap: 3 — ABNORMAL LOW (ref 5–15)
BUN: 16 mg/dL (ref 6–20)
CO2: 27 mmol/L (ref 22–32)
Calcium: 8 mg/dL — ABNORMAL LOW (ref 8.9–10.3)
Chloride: 106 mmol/L (ref 98–111)
Creatinine, Ser: 0.88 mg/dL (ref 0.44–1.00)
GFR, Estimated: >60 mL/min (ref >60)
Glucose, Bld: 160 mg/dL — ABNORMAL HIGH (ref 70–99)
Potassium: 3.6 mmol/L (ref 3.5–5.1)
Sodium: 136 mmol/L (ref 135–145)

## 2021-11-21 LAB — GLUCOSE, CAPILLARY
Glucose-Capillary: 146 mg/dL — ABNORMAL HIGH (ref 70–99)
Glucose-Capillary: 146 mg/dL — ABNORMAL HIGH (ref 70–99)

## 2021-11-21 LAB — CBC
HCT: 23.4 % — ABNORMAL LOW (ref 36.0–46.0)
Hemoglobin: 7.7 g/dL — ABNORMAL LOW (ref 12.0–15.0)
MCH: 22.8 pg — ABNORMAL LOW (ref 26.0–34.0)
MCHC: 32.9 g/dL (ref 30.0–36.0)
MCV: 69.2 fL — ABNORMAL LOW (ref 80.0–100.0)
Platelets: 363 10*3/uL (ref 150–400)
RBC: 3.38 MIL/uL — ABNORMAL LOW (ref 3.87–5.11)
RDW: 18.3 % — ABNORMAL HIGH (ref 11.5–15.5)
WBC: 12.8 10*3/uL — ABNORMAL HIGH (ref 4.0–10.5)
nRBC: 0 % (ref 0.0–0.2)

## 2021-11-21 NOTE — Progress Notes (Signed)
PROGRESS NOTE   Subjective/Complaints: No new concerns this AM. Reports she had a good workout with therapy.   ROS: +pain in medial aspect of right knee,  +constipation- resolved, no HA, No SOB or chest pain   Objective:   No results found. Recent Labs    11/21/21 0607  WBC 12.8*  HGB 7.7*  HCT 23.4*  PLT 363    Recent Labs    11/21/21 0607  NA 136  K 3.6  CL 106  CO2 27  GLUCOSE 160*  BUN 16  CREATININE 0.88  CALCIUM 8.0*     Intake/Output Summary (Last 24 hours) at 11/21/2021 1004 Last data filed at 11/21/2021 0100 Gross per 24 hour  Intake 600 ml  Output --  Net 600 ml         Physical Exam: Vital Signs Blood pressure (!) 109/50, pulse 80, temperature 98.3 F (36.8 C), resp. rate 18, height 5\' 11"  (1.803 m), weight 119.7 kg, SpO2 93 %. Gen: no distress, normal appearing, BMI 36.81 HEENT: oral mucosa pink and moist, NCAT Cardio: Reg rate, no MRG Chest: CTAB, normal effort, normal rate of breathing Abd: soft, non-distended Ext: Right BKA Semirigid BKA protector in place She has insulin pump LUE Psych: Pt's affect is appropriate. Pt is cooperative. Pleasant. A little anxious Skin: Clean and intact without signs of breakdown Neuro:  Follows commands and answers questions. Speech normal. Sensation intact to LT in all 4 extremities. She reports slightly altered sensation in LLE (chronic due to DM) CN 2-12 intact Musculoskeletal: 5/5 in B/L UE and LLE strength. Able to move RLE at hip.  Normal tone and bulk. No joint swelling noted.  Functional mobility: Can don limb guard set-up. Able to perform prone exercises   Assessment/Plan: 1. Functional deficits which require 3+ hours per day of interdisciplinary therapy in a comprehensive inpatient rehab setting. Physiatrist is providing close team supervision and 24 hour management of active medical problems listed below. Physiatrist and rehab team continue  to assess barriers to discharge/monitor patient progress toward functional and medical goals  Care Tool:  Bathing    Body parts bathed by patient: Right arm, Left upper leg, Left arm, Chest, Abdomen, Left lower leg, Front perineal area, Face, Buttocks, Right upper leg     Body parts n/a: Right lower leg   Bathing assist Assist Level: Supervision/Verbal cueing     Upper Body Dressing/Undressing Upper body dressing   What is the patient wearing?: Pull over shirt    Upper body assist Assist Level: Independent    Lower Body Dressing/Undressing Lower body dressing      What is the patient wearing?: Underwear/pull up     Lower body assist Assist for lower body dressing: Minimal Assistance - Patient > 75%     Toileting Toileting    Toileting assist Assist for toileting: Contact Guard/Touching assist     Transfers Chair/bed transfer  Transfers assist     Chair/bed transfer assist level: Minimal Assistance - Patient > 75% Chair/bed transfer assistive device:   Ambulation assist      Assist level: Minimal Assistance - Patient > 75% Assistive device: Walker-rolling Max distance:  41ft   Walk 10 feet activity   Assist  Walk 10 feet activity did not occur: Safety/medical concerns        Walk 50 feet activity   Assist Walk 50 feet with 2 turns activity did not occur: Safety/medical concerns         Walk 150 feet activity   Assist Walk 150 feet activity did not occur: Safety/medical concerns         Walk 10 feet on uneven surface  activity   Assist Walk 10 feet on uneven surfaces activity did not occur: Safety/medical concerns         Wheelchair     Assist Is the patient using a wheelchair?: Yes Type of Wheelchair: Manual    Wheelchair assist level: Supervision/Verbal cueing Max wheelchair distance: 156ft    Wheelchair 50 feet with 2 turns activity    Assist        Assist Level:  Supervision/Verbal cueing   Wheelchair 150 feet activity     Assist      Assist Level: Minimal Assistance - Patient > 75%   Blood pressure (!) 109/50, pulse 80, temperature 98.3 F (36.8 C), resp. rate 18, height 5\' 11"  (1.803 m), weight 119.7 kg, SpO2 93 %.    Medical Problem List and Plan: 1. 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              -patient may not shower due to wound vac             -ELOS/Goals: 7-10 days  -Wound vac removed without complication.   Continue CIR with PT/OT  -[place] today 2.  Impaired mobility: continue Lovenox             -antiplatelet therapy: Aspirin 81 mg 3. Pain along medial aspect of right knee: continue Voltaren gel ordered prn. Tylenol and oxycodone as needed, Neurontin 300 mg 3 times daily, amitriptyline 25 mg at bedtime>>migraine prevention             -Monitor for phantom pain and sensation. Provided with pain relief journal.  4. Mood: LCSW to evaluate and provide emotional support             -antipsychotic agents: n/a 5. Neuropsych: This patient is capable of making decisions on her own behalf. 6. Skin/Wound Care: Routine skin care checks 7. Fluids/Electrolytes/Nutrition: Routine I's and O's and follow-up chemistries 8: DM on insulin pump: Continue current treatment with insulin pump, CBG with fair control, continue to monitor 9: Hypertension: Continue amlodipine and lisinopril -6/5 BP continues to be well controlled 10: Hyperlipidemia: continue Lipitor 12: Iron deficiency anemia and acute blood loss anemia: continue ferous sulfate and vitamin C. Last Hgb 7.2, repeat Hgb up to 7.7, monitor 13: Tobacco use: continue Chantix at bedtime 14: History of migraine headache: none recently; Elavil at night for prevention 15: Obesity BMI 36.81: provide dietary education 16: Leukocytosis: improving, discussed with patient.  -5/16 Improved to 12.8, monitor 17. Transaminitis: d/c Tylenol.   18. Canker sore: Blistex ordered 19. Dry lips: vaseline ordered.  20. Constipation: milk of mag prn ordered.      LOS: 5 days A FACE TO FACE EVALUATION WAS PERFORMED  10-11-1984 11/21/2021, 10:04 AM

## 2021-11-21 NOTE — Telephone Encounter (Signed)
Pt's husband Randel Books dropped off additional forms to be filled out for short term disability. Will forward to [employer]

## 2021-11-21 NOTE — Progress Notes (Signed)
Physical Therapy Session Note  Patient Details  Name: Valerie Le MRN: [id number] Date of Birth: 07-29-1970  Today's Date: 11/21/2021 PT Individual Time: 0800-0911 PT Individual Time Calculation (min): 71 min   Short Term Goals: Week 1:  PT Short Term Goal 1 (Week 1): Pt will perform sit<>stands using LRAD with CGA PT Short Term Goal 2 (Week 1): Pt will perform bed<>chair transfers using LRAD with CGA PT Short Term Goal 3 (Week 1): Pt will ambulate at least 16f using LRAD with CGA PT Short Term Goal 4 (Week 1): Pt will navigate up/down 2 steps using HRs with CGA PT Short Term Goal 5 (Week 1): Pt will propel w/c at least [id number] supervision  Skilled Therapeutic Interventions/Progress Updates:      Pt sitting EOB to start session. Agreeable to PT tx and reports 1/10 RLE pain - treatment to tolerance with rest breaks provided as needed. Started session with discussing DC planning, home setup, etc - pt needs to go down 3 steps with B rails to enter her apartment. Family available for 24/7 S/A. Will require  22x18 w/c and  bari RW at DC. Retrieved disposable pants/top from utility closet. Pt requesting to toilet prior to getting dressed and leaving her room. Sit<>Stand from raised EOB with CGA to RW - hopped ~1033fith CGA and RW from bed to toilet (BSC over toilet). Pt continent of bladder. Dressed with setupA other than minA for threading LLE in pants. Hopped ~8f69fth CGA and RW back to her w/c, slow speed but careful. Instructed in w/c mobility, propelling ~125ft46fortho rehab gym with supervision - required assist for entering 32inch doorway. Practiced car transfers with car height set to 31inch, simulating [place] to complete with CGA with cues for sequencing and setup. Pt then experiencing nausea and required emesis bag. After this resolved, pt in agreement to continue session. Wheeled in // bars to work on stair progressions. Facing // bars, completed 2x5 hops onto  1.5inch platform - adequate clearance for hopping but anticipate her 6inch steps will be difficult. Pt returned to her room in w/c and remained seated with family at the bedside. All needs met.   Therapy Documentation Precautions:  Precautions Precautions: Fall, Other (comment) Precaution Comments: R LE wound vac to BKA Restrictions Weight Bearing Restrictions: Yes RLE Weight Bearing: Non weight bearing LLE Weight Bearing: Weight bearing as tolerated General:     Therapy/Group: Individual Therapy  [name] [name] 11/21/2021, 8:20 AM

## 2021-11-21 NOTE — Progress Notes (Signed)
Occupational Therapy Session Note  Patient Details  Name: Valerie Le MRN: [id number] Date of Birth: [birth year]  Today's Date: 11/21/2021 OT Individual Time: 1045-1200;  OT Individual Time Calculation (min): 75 min ;   Short Term Goals: Week 1:  OT Short Term Goal 1 (Week 1): Pt will complete toileting tasks with min assist OT Short Term Goal 2 (Week 1): Pt will complete stand pivot to [facility] with CGA OT Short Term Goal 3 (Week 1): Pt will complete LB dressing with min assist OT Short Term Goal 4 (Week 1): Pt will exhibit dynamic standing with CGA with unilateral release UE.  Skilled Therapeutic Interventions/Progress Updates:   First session:  Subjective: Pt states she already washed up this AM. She notes having phantom limb sensations and mild pain yesterday.  Requesting to try mirror therapy again during this session.     Objective:  Pt sitting up in w/c. She donned her limb guard with independence. Then she self propelled ~200 feet to large gym with mod I. Stand pivot to EOM using RW with CGA. Pt pushed pelvis back into long sitting with wedge placed against wall to support trunk for comfortable resting position. Pt doffed left sock and shoe with independence. Mirror placed midline between LLE and right residual limb and pt instructed to imagine she is "looking through a window" to her RLE. Calming music provided in background to reduce anxiety and facilitate sustained focus.  Various textures provided to left foot including smooth mat surface and towel textures.  OT noted right residual limb with slight increase in drainage seeping through shrinker, therefore removed shrinker, applied sterile guaze and made nurse aware.  Pt agreeable to completing therapeutic massage to self- proximal to distal (avoiding incision region) to also facilitate reduced phantom limb sensations and pain. Pt donned clean shrinker with assist to keep guaze in place only.  Pt donned sock and shoe to left foot  with independence and completed anterior scoot with supervision. Stand pivot back to w/c using RW with CGA.  Pt self propelled ~200 feet back to room with independence. Call bell in reach, seat alarm on.     Assessment:   Increased steadiness and independence during stand pivot transfers.  Pt less tearful/emotional during mirror therapy today.    Second session: Subjective: Pt states no pain and agreeable to participating during OT session.      Objective:  Pt sitting up in w/c, self propelled 115 feet to ortho gym with mod I and completed stand pivot w/c to EOM with CGA.  Completed standing balance unilateral release activity using horse shoes grasp and toss.  Attempted RUE then LUE x 2 each with CGA throughout.  Pt able to release using LUE for longer duration than RUE.  Pt completed BUE strengthening using 5lb dowel horizontal chest press 3 x 12 reps. Then pt completed core strengthening activities including: Lateral lean and reach-grasp medicine balls weighted 2.5-6.5 lbs with LUE/pass midline/place using RUE and reverse.  Also completed reach with BUE, rotate trunk across midline, and place with BUE (using 5lb medicine ball). 3 x 10 reps each. Stand pivot to w/c with CGA. Transported to room and pt requesting to use toilet.  Stand pivot using RW BSC over toilet transfer with CGA.  Toileting completed with CGA at sit<>stand level.  Pt transported to room and completed SPT to EOB with CGA.Direct hand off to nurse.     Assessment:  Pt making progress evidenced by increased independence during toileting at sit<>stand  level which is improved and uprgraded from lateral leans.   Plan: Pt would benefit from further training on dynamic standing with unilateral and BUE release to promote increased safety and independence during self care.   Therapy Documentation Precautions:  Precautions Precautions: Fall, Other (comment) Precaution Comments: R LE wound vac to BKA Restrictions Weight Bearing Restrictions:  Yes RLE Weight Bearing: Non weight bearing LLE Weight Bearing: Weight bearing as tolerated    Therapy/Group: Individual Therapy  Amie Critchley 11/21/2021, 12:36 PM

## 2021-11-21 NOTE — Progress Notes (Signed)
Patient ID: Valerie Le, female   DOB: July 17, 1970, 51 y.o.   MRN: [id number] Met with the patient to review rehab process, team conference and plan of care. Discussed secondary risks including DM (A1C 7.4), HTN and HLD (LDL 60/Trig 79). Patient expressed concern about dietary control/CMM; reinforced A1C reading and fat/cholesterol levels) Given handouts on CMM diet, diet tips, food recommendations with intake of protein with a carb for metabolism. Dietician consulted for additional information on CMM/HH diet. Reviewed smoking cessation rationale and meds. Notes drainage from incision post Vac removal and understands wearing of residual limb sock/shrinker and care of limb. Continue to follow along to discharge to address educational needs to facilitate preparation for discharge. Margarito Liner, RN

## 2021-11-21 NOTE — Progress Notes (Signed)
Patient ID: Valerie Le, female   DOB: November 28, 1970, 51 y.o.   MRN: [id number]  Met with pt to update her regarding peer support. Have reached out to Robin-[employer] to see if she can get a female close to pt's age to speak with her. Also discuss insurance will cover a wheelchair or rolling walker not both so gave pt information on getting a rolling walker on her own and [employer]. She is in better spirits today due to got to see husband since he can not visit over the weekend due to no valet parking and he has had a stroke and is not able to walk very far. Continue to work on discharge needs for discharge.

## 2021-11-22 ENCOUNTER — Encounter: Payer: Self-pay | Admitting: Orthopedic Surgery

## 2021-11-22 LAB — GLUCOSE, CAPILLARY
Glucose-Capillary: 173 mg/dL — ABNORMAL HIGH (ref 70–99)
Glucose-Capillary: 165 mg/dL — ABNORMAL HIGH (ref 70–99)
Glucose-Capillary: 194 mg/dL — ABNORMAL HIGH (ref 70–99)

## 2021-11-22 NOTE — Progress Notes (Signed)
Occupational Therapy Session Note  Patient Details  Name: Valerie Le MRN: [id number] Date of Birth: 12-23-1970  Today's Date: 11/22/2021 OT Individual Time: [id number] OT Individual Time Calculation (min): 85 min    Short Term Goals: Week 1:  OT Short Term Goal 1 (Week 1): Pt will complete toileting tasks with min assist OT Short Term Goal 2 (Week 1): Pt will complete stand pivot to [facility] with CGA OT Short Term Goal 3 (Week 1): Pt will complete LB dressing with min assist OT Short Term Goal 4 (Week 1): Pt will exhibit dynamic standing with CGA with unilateral release UE.  Skilled Therapeutic Interventions/Progress Updates:    Subjective: Pt states she needs to use bathroom and would like to take a shower. Slept very well last night and no c/o pain throughout session.    Objective:  Pt semi reclined in bed.Donned limb guard with setup. All functional mobility completed with supervision including supine to sit, stand pivot EOB to w/c using RW, self propelled w/c to bathroom, squat pivot to drop arm bari BSC over toilet, squat pivot to w/c, squat pivot to shower bench.   Pt completed toileting using lateral leans with mod I. Doffed underwear, pants, shirt with mod I. Pt also applied waterproof bag to right residual limb with setup. Bathed UB/LB using lateral leans with distant supervision.  Intermittent vc's needed during w/c mobility to navigate small spaces and also re-education on safe approach prior to squat pivots to various surfaces. Discussed making sure to retrieve all ADL items ahead of time and place within reach prior to showering.  Pt donned shirt and underwear with setup using lateral leans.  Pt transported to room and donned pants at sit<>stand level using RW with supervision. Recommended to pt purchase of reacher to aide in safe item retrieval off floor including towels or dropped items. Pt donned bilateral leg rests with setup. Call bell in reach, seat alarm on.      Assessment:  Pt making progress evidenced by increased independence during bathing and dressing and approaching goal level of mod I.    Plan: Pt would benefit from further training on tight space negotiation w/c propulsion and initiate IADL training.   Therapy Documentation Precautions:  Precautions Precautions: Fall, Other (comment) Precaution Comments: R LE wound vac to BKA Restrictions Weight Bearing Restrictions: Yes RLE Weight Bearing: Non weight bearing LLE Weight Bearing: Weight bearing as tolerated    Therapy/Group: Individual Therapy  Ezekiel Slocumb 11/22/2021, 9:24 AM

## 2021-11-22 NOTE — Progress Notes (Signed)
Patient ID: Valerie Le, female   DOB: 02-23-1971, 51 y.o.   MRN: [id number] Attempting to obtain home health for pt via her [place] insurance. Have contacted [employer] to being referral process. Have faxed information to them and they will begin the process of finding a home health agency to accept her. Care Coordinator-Laura B [phone] ext [phone]. Check with tomorrow regarding update.

## 2021-11-22 NOTE — Progress Notes (Signed)
Physical Therapy Session Note  Patient Details  Name: Valerie Le MRN: [id number] Date of Birth: Apr 01, 1971  Today's Date: 11/22/2021 PT Individual Time: 1000-1057 + 1300-1409  PT Individual Time Calculation (min): 57 min  + 69 min  Short Term Goals: Week 1:  PT Short Term Goal 1 (Week 1): Pt will perform sit<>stands using LRAD with CGA PT Short Term Goal 2 (Week 1): Pt will perform bed<>chair transfers using LRAD with CGA PT Short Term Goal 3 (Week 1): Pt will ambulate at least 59f using LRAD with CGA PT Short Term Goal 4 (Week 1): Pt will navigate up/down 2 steps using HRs with CGA PT Short Term Goal 5 (Week 1): Pt will propel w/c at least 1269fwitih supervision  Skilled Therapeutic Interventions/Progress Updates:     1st session: Pt sitting in w/c to start, agreeable to PT tx. Denies pain. She propelled herself 15027fith supervision in w/c, using BUE to propel. In main rehab gym, we discussed barrier to DC primarily being her 3 STE home. Discussed potential options of non-emergency ambulance ride home vs laying down plywood on grass to roll over in her w/c.   Gait training [id number] RW and CGA for safety - hopping with adequate foot clearance - heavy reliance of UE strength through RW. Gait distance limited by fatigue.   Pt reporting doorways are <30inches - her w/c size is 31inchs rim to rim, therefore she will be required to hop over doorway threshold and through doorway frames. Focused on this functional task using 3inch threshold. She was able to hop over this using RW and CGA/minA x4 times total (2 +2 with seated rest).   Returned to her room in w/c and pt requesting to return to bed due to fatigue. Stand<>pivot transfer with close supervision and RW. Bed mobility completed mod I. Remained in bed with family at bedside, all needs met.   2nd session: Pt supine in bed to start with family at the bedside. Pt agreeable to PT tx and denies resting pain. Bed mobility  completed mod I. Donned tennis shoe with setupA. Sit<>stand to RW with supervision from slightly raised EOB. Stand<>pivot transfer to w/c with RW and supervision - good safety awareness with transfer. She propelled herself ~125ft12fh supervision to ortho rehab gym. Worked on dynamic standing balance and reaching outside BOS by using BITS sytem - tasks completed with vs without RW support - CGA for safety. Activities including Maze, Trail making, and geoboard with memory component. She had x1 LOB to the R that required minA for correcting while reaching to her R. Completed ambulatory (hopping) transfer with RW and CGA to Nustep. Completed Nustep (NWB on RLE) using LLE and BUE - x10 minutes, workload set to 6. Completed games of standing horseshoe toss with RW support and CGA for balance - after each toss she was instructed to complete 10 standing heel raises to strengthen PF's. Transported back to her room in w/c and completed ambulatory transfer back to bed with supervision and RW. Able to doff shoe without assist and bed mobility completed mod I. Remained in bed with all needs met, call bell in reach, bed alarm on.    Therapy Documentation Precautions:  Precautions Precautions: Fall, Other (comment) Precaution Comments: R LE wound vac to BKA Restrictions Weight Bearing Restrictions: Yes RLE Weight Bearing: Non weight bearing LLE Weight Bearing: Weight bearing as tolerated General:     Therapy/Group: Individual Therapy  ChrisAlger Simons2023, 7:40 AM

## 2021-11-22 NOTE — Progress Notes (Signed)
PROGRESS NOTE   Subjective/Complaints: Has some questions about nutrition for diabetes management Working on showering with Kendal Hymen No other complaints Currently using Omnipod insulin pump  ROS: +pain in medial aspect of right knee, +canker sore- still dry, +constipation- resolved, minimal residual limb pain   Objective:   No results found. Recent Labs    11/21/21 0607  WBC 12.8*  HGB 7.7*  HCT 23.4*  PLT 363    Recent Labs    11/21/21 0607  NA 136  K 3.6  CL 106  CO2 27  GLUCOSE 160*  BUN 16  CREATININE 0.88  CALCIUM 8.0*     Intake/Output Summary (Last 24 hours) at 11/22/2021 1019 Last data filed at 11/22/2021 0735 Gross per 24 hour  Intake 480 ml  Output --  Net 480 ml        Physical Exam: Vital Signs Blood pressure 134/70, pulse 90, temperature 98 F (36.7 C), resp. rate 18, height 5\' 11"  (1.803 m), weight 119.7 kg, SpO2 96 %. Gen: no distress, normal appearing, BMI 36.81 HEENT: oral mucosa pink and moist, NCAT Cardio: Reg rate Chest: normal effort, normal rate of breathing Abd: soft, non-distended Ext: Right BKA wound VAC in place with good seal; no output in canister. Semirigid BKA protector in place She has Omnipod insulin pump LUE Psych: Pt's affect is appropriate. Pt is cooperative. Pleasant. A little anxious Skin: Clean and intact without signs of breakdown Neuro:  Follows commands and answers questions. Speech normal. Sensation intact to LT in all 4 extremities. She reports slightly altered sensation in LLE (chronic due to DM) CN 2-12 intact Musculoskeletal: 5/5 in B/L UE and LLE strength. Able to move RLE at hip.  Normal tone and bulk. No joint swelling noted.  Functional mobility: Can don limb guard set-up. Able to perform prone exercises   Assessment/Plan: 1. Functional deficits which require 3+ hours per day of interdisciplinary therapy in a comprehensive inpatient rehab  setting. Physiatrist is providing close team supervision and 24 hour management of active medical problems listed below. Physiatrist and rehab team continue to assess barriers to discharge/monitor patient progress toward functional and medical goals  Care Tool:  Bathing    Body parts bathed by patient: Right arm, Left upper leg, Left arm, Chest, Abdomen, Left lower leg, Front perineal area, Face, Buttocks, Right upper leg     Body parts n/a: Right lower leg   Bathing assist Assist Level: Supervision/Verbal cueing     Upper Body Dressing/Undressing Upper body dressing   What is the patient wearing?: Pull over shirt    Upper body assist Assist Level: Independent    Lower Body Dressing/Undressing Lower body dressing      What is the patient wearing?: Underwear/pull up     Lower body assist Assist for lower body dressing: Minimal Assistance - Patient > 75%     Toileting Toileting    Toileting assist Assist for toileting: Contact Guard/Touching assist     Transfers Chair/bed transfer  Transfers assist     Chair/bed transfer assist level: Minimal Assistance - Patient > 75% Chair/bed transfer assistive device:   Ambulation assist  Assist level: Minimal Assistance - Patient > 75% Assistive device: Walker-rolling Max distance: 42ft   Walk 10 feet activity   Assist  Walk 10 feet activity did not occur: Safety/medical concerns        Walk 50 feet activity   Assist Walk 50 feet with 2 turns activity did not occur: Safety/medical concerns         Walk 150 feet activity   Assist Walk 150 feet activity did not occur: Safety/medical concerns         Walk 10 feet on uneven surface  activity   Assist Walk 10 feet on uneven surfaces activity did not occur: Safety/medical concerns         Wheelchair     Assist Is the patient using a wheelchair?: Yes Type of Wheelchair: Manual    Wheelchair assist  level: Supervision/Verbal cueing Max wheelchair distance: 11ft    Wheelchair 50 feet with 2 turns activity    Assist        Assist Level: Supervision/Verbal cueing   Wheelchair 150 feet activity     Assist      Assist Level: Minimal Assistance - Patient > 75%   Blood pressure 134/70, pulse 90, temperature 98 F (36.7 C), resp. rate 18, height 5\' 11"  (1.803 m), weight 119.7 kg, SpO2 96 %.    Medical Problem List and Plan: 1. 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              -patient may not shower due to wound vac             -ELOS/Goals: 7-10 days  -Wound vac removed without complication.   Continue CIR 2.  Impaired mobility: continue Lovenox             -antiplatelet therapy: Aspirin 81 mg 3. Pain along medial aspect of right knee: continue Voltaren gel ordered prn. Tylenol and oxycodone as needed, Neurontin 300 mg 3 times daily, amitriptyline 25 mg at bedtime>>migraine prevention             -Monitor for phantom pain and sensation. Provided with pain relief journal.  4. Mood: LCSW to evaluate and provide emotional support             -antipsychotic agents: n/a 5. Neuropsych: This patient is capable of making decisions on her own behalf. 6. Skin/Wound Care: Routine skin care checks 7. Fluids/Electrolytes/Nutrition: Routine I's and O's and follow-up chemistries 8: DM on insulin pump: Continue current treatment with insulin pump, CBG with fair control, continue to monitor. Recommended 1 TB apple cider vinegar in a glass of water before meals.  9: Hypertension: Continue amlodipine and lisinopril. Provided list of foods to lower blood pressure.  -BP overall good control 10: Hyperlipidemia: continue Lipitor 12: Iron deficiency anemia and acute blood loss anemia: continue ferous sulfate and vitamin C. Last Hgb 7.2, repeat Hgb Monday 13: Tobacco use: continue Chantix at bedtime 14: History of migraine headache: none  recently; Elavil at night for prevention 15: Obesity BMI 36.81: provide dietary education. Provided list of foods to help with weight loss.  16: Leukocytosis: improving, discussed with patient. Repeat WBC Monday.  17. Transaminitis: d/c Tylenol.  18. Canker sore: Blistex ordered 19. Dry lips: vaseline ordered.  20. Constipation: milk of mag prn ordered. Provided list of foods to help with constipation.      LOS: 6 days A FACE TO FACE EVALUATION WAS PERFORMED  Saturday Yuvia Plant 11/22/2021, 10:19 AM

## 2021-11-22 NOTE — Progress Notes (Signed)
Inpatient Diabetes Program Recommendations  AACE/ADA: New Consensus Statement on Inpatient Glycemic Control (2015)  Target Ranges:  Prepandial:   less than 140 mg/dL      Peak postprandial:   less than 180 mg/dL (1-2 hours)      Critically ill patients:  140 - 180 mg/dL   Lab Results  Component Value Date   GLUCAP 165 (H) 11/22/2021   HGBA1C 7.4 (H) 11/11/2021    Review of Glycemic Control  Diabetes history: DM 2 (wears insulin pump) Outpatient Diabetes medications: Omnipod insulin pump- per last note from Dr. Shawnee Knapp her basal rates were 1.25 units/hr, Insulin to CHO ratio- 1 unit/6 grams of CHO, and sensitivity 1 unit drops blood sugar 30 mg/dL Current orders for Inpatient glycemic control: Omnipod insulin pump  Inpatient Diabetes Program Recommendations:   Patient currently has insulin pump on. Will follow and available as needed.  Thank you, Billy Fischer. Judithann Villamar, RN, MSN, CDE  Diabetes Coordinator Inpatient Glycemic Control Team Team Pager [phone] (8am-5pm) 11/22/2021 9:16 AM

## 2021-11-22 NOTE — Plan of Care (Signed)
  RD consulted for nutrition education regarding diabetes and heart healthy diet.   Lab Results  Component Value Date   HGBA1C 7.4 (H) 11/11/2021    Pt has T1DM. She reports that her blood sugars have improved significantly within the last year with HgbA1c down from 13% to 7.2%. She states that this has occurred since her 1st surgery in June. Pt recalls a typically intake including 1-2 meals per day. She typically takes a while to finish her lunch at work and eats an on-the-go breakfast in the morning consisting of coffee and cheese. Her husband prepares meals at home which include chicken, greens and macaroni and cheese. Pt uses a CGM to monitor her blood sugar and an insulin pump. Her beverage intake typically includes water and sodas with zero calories.   We discussed including more snacks between meals consisting of a protein and carbohydrate for sustained blood sugars and more consistent meal/snack intake throughout the day. Spoke with pt about eating a variety of the food groups with each meal.   Provided examples of ways to balance meals/snacks and encouraged intake of high-fiber, whole grain complex carbohydrates. Teach back method used.  Expect good compliance.  Body mass index is 36.81 kg/m. Pt meets criteria for obesity based on current BMI.  Current diet order is Carb modified, patient is consuming approximately 100% of meals at this time. Labs and medications reviewed. No further nutrition interventions warranted at this time. RD contact information provided. If additional nutrition issues arise, please re-consult RD.  Clayborne Dana, RDN, LDN Clinical Nutrition

## 2021-11-23 LAB — GLUCOSE, CAPILLARY
Glucose-Capillary: 140 mg/dL — ABNORMAL HIGH (ref 70–99)
Glucose-Capillary: 156 mg/dL — ABNORMAL HIGH (ref 70–99)
Glucose-Capillary: 140 mg/dL — ABNORMAL HIGH (ref 70–99)

## 2021-11-23 MED ORDER — AMLODIPINE BESYLATE 5 MG PO TABS
5.0000 mg | ORAL_TABLET | Freq: Every day | ORAL | Status: DC
  Administered 2021-11-23 – 2021-11-24 (×2): 5 mg via ORAL
  Filled 2021-11-23 (×2): qty 1

## 2021-11-23 NOTE — Progress Notes (Signed)
PROGRESS NOTE   Subjective/Complaints: No new complaints this morning Stairs is a significant barrier to discharge. No guarantee of home health  ROS: +pain in medial aspect of right knee, +canker sore- still dry, +constipation- resolved, minimal residual limb pain   Objective:   No results found. Recent Labs    11/21/21 0607  WBC 12.8*  HGB 7.7*  HCT 23.4*  PLT 363    Recent Labs    11/21/21 0607  NA 136  K 3.6  CL 106  CO2 27  GLUCOSE 160*  BUN 16  CREATININE 0.88  CALCIUM 8.0*     Intake/Output Summary (Last 24 hours) at 11/23/2021 1108 Last data filed at 11/23/2021 0750 Gross per 24 hour  Intake 820 ml  Output --  Net 820 ml        Physical Exam: Vital Signs Blood pressure (!) 115/52, pulse 81, temperature 98.4 F (36.9 C), temperature source Oral, resp. rate 18, height 5\' 11"  (1.803 m), weight 119.7 kg, SpO2 94 %. Gen: no distress, normal appearing, BMI 36.81 HEENT: oral mucosa pink and moist, NCAT Cardio: Reg rate Chest: normal effort, normal rate of breathing Abd: soft, non-distended Ext: Right BKA wound VAC in place with good seal; no output in canister. Semirigid BKA protector in place She has Omnipod insulin pump LUE Psych: Pt's affect is appropriate. Pt is cooperative. Pleasant. A little anxious Skin: Clean and intact without signs of breakdown Neuro:  Follows commands and answers questions. Speech normal. Sensation intact to LT in all 4 extremities. She reports slightly altered sensation in LLE (chronic due to DM) CN 2-12 intact Musculoskeletal: 5/5 in B/L UE and LLE strength. Able to move RLE at hip.  Normal tone and bulk. No joint swelling noted.  Functional mobility: Can don limb guard set-up. Able to perform prone exercises. Hopping with CS with RW.   Assessment/Plan: 1. Functional deficits which require 3+ hours per day of interdisciplinary therapy in a comprehensive inpatient  rehab setting. Physiatrist is providing close team supervision and 24 hour management of active medical problems listed below. Physiatrist and rehab team continue to assess barriers to discharge/monitor patient progress toward functional and medical goals  Care Tool:  Bathing    Body parts bathed by patient: Right arm, Left upper leg, Left arm, Chest, Abdomen, Left lower leg, Front perineal area, Face, Buttocks, Right upper leg     Body parts n/a: Right lower leg   Bathing assist Assist Level: Supervision/Verbal cueing     Upper Body Dressing/Undressing Upper body dressing   What is the patient wearing?: Pull over shirt    Upper body assist Assist Level: Independent    Lower Body Dressing/Undressing Lower body dressing      What is the patient wearing?: Underwear/pull up     Lower body assist Assist for lower body dressing: Minimal Assistance - Patient > 75%     Toileting Toileting    Toileting assist Assist for toileting: Contact Guard/Touching assist     Transfers Chair/bed transfer  Transfers assist     Chair/bed transfer assist level: Minimal Assistance - Patient > 75% Chair/bed transfer assistive device:  assist      Assist level: Minimal Assistance - Patient > 75% Assistive device: Walker-rolling Max distance: 15ft   Walk 10 feet activity   Assist  Walk 10 feet activity did not occur: Safety/medical concerns        Walk 50 feet activity   Assist Walk 50 feet with 2 turns activity did not occur: Safety/medical concerns         Walk 150 feet activity   Assist Walk 150 feet activity did not occur: Safety/medical concerns         Walk 10 feet on uneven surface  activity   Assist Walk 10 feet on uneven surfaces activity did not occur: Safety/medical concerns         Wheelchair     Assist Is the patient using a wheelchair?: Yes Type of Wheelchair: Manual    Wheelchair  assist level: Supervision/Verbal cueing Max wheelchair distance: 188ft    Wheelchair 50 feet with 2 turns activity    Assist        Assist Level: Supervision/Verbal cueing   Wheelchair 150 feet activity     Assist      Assist Level: Minimal Assistance - Patient > 75%   Blood pressure (!) 115/52, pulse 81, temperature 98.4 F (36.9 C), temperature source Oral, resp. rate 18, height 5\' 11"  (1.803 m), weight 119.7 kg, SpO2 94 %.    Medical Problem List and Plan: 1. 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              -patient may not shower due to wound vac             -ELOS/Goals: 7-10 days  -Wound vac removed without complication.   Continue CIR  -Interdisciplinary Team Conference today   2.  Impaired mobility: continue Lovenox             -antiplatelet therapy: Aspirin 81 mg 3. Pain along medial aspect of right knee: continue Voltaren gel ordered prn. Tylenol and oxycodone as needed, Neurontin 300 mg 3 times daily, amitriptyline 25 mg at bedtime>>migraine prevention             -Monitor for phantom pain and sensation. Provided with pain relief journal.  4. Mood: LCSW to evaluate and provide emotional support             -antipsychotic agents: n/a 5. Neuropsych: This patient is capable of making decisions on her own behalf. 6. Skin/Wound Care: Routine skin care checks 7. Fluids/Electrolytes/Nutrition: Routine I's and O's and follow-up chemistries 8: DM on insulin pump: Continue current treatment with insulin pump, CBG with fair control, continue to monitor. Recommended 1 TB apple cider vinegar in a glass of water before meals.  9: Hypertension: Decrease amlodipine to 5mg  and lisinopril. Provided list of foods to lower blood pressure.  -BP overall good control 10: Hyperlipidemia: continue Lipitor 12: Iron deficiency anemia and acute blood loss anemia: continue ferous sulfate and vitamin C. Last Hgb 7.2, repeat Hgb  Monday 13: Tobacco use: continue Chantix at bedtime 14: History of migraine headache: none recently; Elavil at night for prevention 15: Obesity BMI 36.81: provide dietary education. Provided list of foods to help with weight loss.  16: Leukocytosis: improving, discussed with patient. Repeat WBC Monday.  17. Transaminitis: d/c Tylenol.  18. Canker sore: Blistex ordered 19. Dry lips: vaseline ordered.  20. Constipation: resolved. milk of mag prn ordered. Provided list of foods to help with constipation.  LOS: 7 days A FACE TO FACE EVALUATION WAS PERFORMED  Valerie Le P Krishan Mcbreen 11/23/2021, 11:08 AM

## 2021-11-23 NOTE — Progress Notes (Signed)
Patient ID: Valerie Le, female   DOB: 12-Mar-1971, 51 y.o.   MRN: [id number] Met with pt to update her regarding team conference goal of mod/I-supervision level and target discharge date of 6/9. [facility] has found a [place] agency to accept her referral-[facility] for PT & OT. Have ordered a wheelchair and 3 in 1 via [employer]. Still trying to see if can maneuver the three steps to get into the apartment where she lives. May need to go home via ambulance. Will work on discharge needs and await PT recommendation regarding transport home.

## 2021-11-23 NOTE — Patient Care Conference (Signed)
Inpatient RehabilitationTeam Conference and Plan of Care Update Date: 11/23/2021   Time: 11:06 AM    Patient Name: Valerie Le      Medical Record Number: [id number]  Date of Birth: [birth year] Sex: Female         Room/Bed: 4M09C/4M09C-01 Payor Info: Payor: CIGNA / Plan: CIGNA MANAGED / Product Type: *No Product type* /    Admit Date/Time:  11/16/2021  4:16 PM  Primary Diagnosis:  S/P BKA (below knee amputation), right [facility])  Hospital Problems: Principal Problem:   S/P BKA (below knee amputation), right [facility])    Expected Discharge Date: Expected Discharge Date: 11/25/21  Team Members Present: Physician leading conference: Dr. Sula Soda Social Worker Present: Dossie Der, LCSW Nurse Present: Chana Bode, RN PT Present: Wynelle Link, PT OT Present: Dolphus Jenny, OT PPS Coordinator present : Edson Snowball, PT     Current Status/Progress Goal Weekly Team Focus  Bowel/Bladder   cont of B/B. last BM-6/6  remain cont.  assess toileting PRN and Q shift   Swallow/Nutrition/ Hydration             ADL's   supervision overall  mod I  self care training, mirror therapy, functional transfers, IADL training, dc planning   Mobility   mod I bed mobility, supervision sit<>stand to RW, supervision stand<>pivot transfers using RW, gait up to 47ft with close supervision and RW. Unsafe to attempt stairs to date, will require transport home.  Supervision  amputee ed, navigating threshold for doorway entrance, stair progressions, dynamic standing balance, DC planning   Communication             Safety/Cognition/ Behavioral Observations            Pain   occasional phantom pain, pain managment( gabapentin) in progress, effective.  Pain<3  assess pain q shift and PRN   Skin   sx insicion to BKA  proper healing  assess skin q shift and PRN     Discharge Planning:  Home with husband who has had a CVA and only able to provide supervision. Stairs an issue at home.  Trying to get home health via [facility] which is difficult due to insurance barriers   Team Discussion: Patient doing well however discharge is hindered by home environment. Issues with 3ste down to entry of home or 10' over grass to back door in wheelchair. Also limited by door frame measurements when in wheelchair.  Patient on target to meet rehab goals: yes, currently supervision overall.  *See Care Plan and progress notes for long and short-term goals.   Revisions to Treatment Plan:  N/A   Teaching Needs: Safety, transfers,toileting, skin care, medications, dietary modifications, etc  Current Barriers to Discharge: Decreased caregiver support, Home enviroment access/layout, and Weight bearing restrictions and insurance for follow up services  Possible Resolutions to Barriers: Family education DME:W/C w amputee pad,  vs transport chair and bari RW     Medical Summary Current Status: residual limb pain, type 2 diabetes with insulin pump and hyperglycemia, morbid obesity, constipation  Barriers to Discharge: Medical stability;Weight;Wound care  Barriers to Discharge Comments: residual limb pain, type 2 diabetes with insulin pump and hyperglycemia, morbid obesity, constipation Possible Resolutions to [employer]: continue robaxin and oxycodone as needed, provided dietary education/insulin pump education, continue milk of mag as needed prn   Continued Need for Acute Rehabilitation Level of Care: The patient requires daily medical management by a physician with specialized training in physical medicine and rehabilitation for the following  reasons: Direction of a multidisciplinary physical rehabilitation program to maximize functional independence : Yes Medical management of patient stability for increased activity during participation in an intensive rehabilitation regime.: Yes Analysis of laboratory values and/or radiology reports with any subsequent need for medication  adjustment and/or medical intervention. : Yes   I attest that I was present, lead the team conference, and concur with the assessment and plan of the team.   Chana Bode B 11/23/2021, 3:08 PM

## 2021-11-23 NOTE — Progress Notes (Signed)
Physical Therapy Session Note  Patient Details  Name: Valerie Le MRN: [id number] Date of Birth: 1971-05-30  Today's Date: 11/23/2021 PT Individual Time: 0800-0912 PT Individual Time Calculation (min): 72 min   Short Term Goals: Week 1:  PT Short Term Goal 1 (Week 1): Pt will perform sit<>stands using LRAD with CGA PT Short Term Goal 2 (Week 1): Pt will perform bed<>chair transfers using LRAD with CGA PT Short Term Goal 3 (Week 1): Pt will ambulate at least 52f using LRAD with CGA PT Short Term Goal 4 (Week 1): Pt will navigate up/down 2 steps using HRs with CGA PT Short Term Goal 5 (Week 1): Pt will propel w/c at least 1212fwitih supervision  Skilled Therapeutic Interventions/Progress Updates:     Pt sitting EOB to start, finishing up her breakfast. While she ate, we discussed DC planning, DME rec's, and problem solving her home entrance. She confirms 3 STE home that are 6inches, 6.5inches, and 7inches tall. She reports her husband "investigated" alternative ways to enter her apartment but no flat surfaces available other than grass. Concerned with pushing her in w/c over grass due to weight and getting "stuck" - pt in agreement.   Donned tennis shoe with setupA while seated EOB. Sit<>stand to RW from slightly elevated EOB with supervision. Hops ~57f46fo w/c with close supervision and RW, pt careful and demonstrates appropriate safety awareness.   She propels herself [id number] main rehab gym with supervision in w/c.  Instructed in threshold management (threshold 4inches tall). Completed a total of x4 reps with CGA/minA and RW - adequate clearance while hopping forward over threshold. Practiced hopping backwards ~7ft 74fer each threshold clearance with CGA and RW.  Worked on stair progressions in // bars via side hopping, facing the // bar. (Pt's hand rails are too far apart at home to hold onto both forward facing). Progressed from 1x5 2inch platform to 3x3 4inch platform! Seated  rest breaks b/w sets. minA required for boosting for the 4inch platform but pt clearing foot well. Fatigues after 3 bouts.  Transferred to Nustep via stand<>pivot transfer using RW and CGA. SetupA on Nustep and she completed 10 minutes with workload set to 5, using LLE and BUE only while maintaining NWB to RLE. Pt required a few rest breaks to complete total 10 minutes. Unable to obtain steps or cadence as battery to Nustep died.  Returned to her room in w/c and she remained seated in w/c with all needs met, call bell in reach. Pt pleased with her ongoing progress.    Therapy Documentation Precautions:  Precautions Precautions: Fall, Other (comment) Precaution Comments: R LE wound vac to BKA Restrictions Weight Bearing Restrictions: Yes RLE Weight Bearing: Non weight bearing LLE Weight Bearing: Weight bearing as tolerated General:    Therapy/Group: Individual Therapy  ChrisAlger Simons2023, 7:29 AM

## 2021-11-23 NOTE — Evaluation (Signed)
Recreational Therapy Assessment and Plan  Patient Details  Name: Valerie Le MRN: [id number] Date of Birth: 06-20-1970 Today's Date: 11/23/2021  Rehab Potential:  Good ELOS:   d/c 6/9  Assessment  Hospital Problem: Principal Problem:   S/P BKA (below knee amputation), right [facility])     Past Medical History:      Past Medical History:  Diagnosis Date   Anemia     Asthma      as a child   Depression     Diabetes mellitus without complication ([place])      Type 1 - Has Insulin Pump   Headache      migraines   High cholesterol     Hypertension      Past Surgical History:       Past Surgical History:  Procedure Laterality Date   AMPUTATION Right 12/22/2020    Procedure: RIGHT TRANSMETATARSAL AMPUTATION;  Surgeon: Newt Minion, MD;  Location: [place];  Service: Orthopedics;  Laterality: Right;   AMPUTATION Right 06/29/2021    Procedure: REVISION RIGHT TRANSMETATARSAL AMPUTATION;  Surgeon: Newt Minion, MD;  Location: [place];  Service: Orthopedics;  Laterality: Right;   AMPUTATION Right 11/11/2021    Procedure: RIGHT BELOW KNEE AMPUTATION;  Surgeon: Newt Minion, MD;  Location: [place];  Service: Orthopedics;  Laterality: Right;   AMPUTATION TOE Right 12/04/2020    Procedure: AMPUTATION RIGHT SECOND TOE, I&D FOOT;  Surgeon: Marybelle Killings, MD;  Location: [facility];  Service: Orthopedics;  Laterality: Right;  Right second toe amputation, I&D right foot.   APPLICATION OF WOUND VAC   11/11/2021    Procedure: APPLICATION OF WOUND VAC;  Surgeon: Newt Minion, MD;  Location: [place];  Service: Orthopedics;;   birth control implant   07/31/2006    Essure Implant   TONSILLECTOMY       TYMPANOSTOMY TUBE PLACEMENT       WISDOM TOOTH EXTRACTION          Assessment & Plan Clinical Impression: Patient is a 51 y.o. year old female with a history of diabetes mellitus type 1 status post right transmetatarsal amputation recently evaluated by Dr. Sharol Given for ongoing chronic draining wound.  She  was receiving oral Augmentin.  She presented to the emergency department on 11/02/2021 with complaints of intermittent chest pain.  This was accompanied by nausea and vomiting.  She denied fever or shortness of breath.  Laboratory work-up revealed downtrending troponin, leukocytosis.  EKG without significant ischemic changes.  Chest pain resolved with aspirin and nitroglycerin.  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  She was admitted and placed on Rocephin and itraconazole.  MRI of the right foot revealed soft tissue ulceration around the base of the fifth metatarsal with soft tissue emphysema and inflammatory changes consistent with soft tissue infection.  Orthopedic surgery consulted and she had a R BKA with application of wound vac completed on 11/11/21.  She did have a fall on 5/27 without significant injury on 5/27 when trying to get to the bathroom.Patient transferred to CIR on 11/16/2021 .    Pt presents with decreased activity tolerance, decreased functional mobility, decreased balance, feelings of stress Limiting pt's independence with leisure/community pursuits.  Full TR eval deferred due to upcoming discharge date.  However, pt referred and actively participated in stress management/coping group today.  Pt education/discussion focused on stress exploration including factors that contribute to stress, factors that protect against stress and potential coping  strategies.  Coping strategies included deep breathing, progressive muscle relaxation, imagery & challenging irrational thoughts.  Handouts provided.  Pt appreciative of this group and education.  Plan  No further TR as pt is expected to d/c 6/9  Recommendations for other services: None   Discharge Criteria: Patient will be discharged from TR if patient refuses treatment 3 consecutive times without medical reason.  If treatment goals not met, if there is a change in medical status, if  patient makes no progress towards goals or if patient is discharged from hospital.  The above assessment, treatment plan, treatment alternatives and goals were discussed and mutually agreed upon: by patient  [place] 11/23/2021, 3:50 PM

## 2021-11-23 NOTE — Progress Notes (Signed)
Occupational Therapy Session Note  Patient Details  Name: Valerie Le MRN: [id number] Date of Birth: 22-Jun-1970  Today's Date: 11/23/2021 OT Group Time: 1430-1530 OT Group Time Calculation (min): 60 min   Short Term Goals: Week 1:  OT Short Term Goal 1 (Week 1): Pt will complete toileting tasks with min assist OT Short Term Goal 2 (Week 1): Pt will complete stand pivot to [facility] with CGA OT Short Term Goal 3 (Week 1): Pt will complete LB dressing with min assist OT Short Term Goal 4 (Week 1): Pt will exhibit dynamic standing with CGA with unilateral release UE.  Skilled Therapeutic Interventions/Progress Updates:  Pt participated in group session with a focus on stress mgmt, education on healthy coping strategies, and social interaction. Focus of session on providing coping strategies to manage new diagnosis to allow for improved mental health to increase overall quality of life . Discussed how to break down stressors into "daily hassles," "major life stressors" and "life circumstances" in an effort to allow pts to chunk their stressors into groups and determine where to best put their efforts/time when dealing with stress. Pt actively sharing stressors and contributing to group conversation. Provided active listening, emotional support and therapeutic use of self. Offered education on factors that protect [place] against stress such as "daily uplifts," "healthy coping strategies" and "protective factors." Encouraged all group members to make an effort to actively recall one event from their day that was a daily uplift in an effort to protect their mindset from stressors as well as sharing this information with their caregivers to facilitate improved caregiver communication and decrease overall burden of care.  Issued pt handouts on healthy coping strategies to implement into routine. Pt transported back to room by RT.  Therapy Documentation Precautions:  Precautions Precautions:  Fall Precaution Comments: R BKA Restrictions Weight Bearing Restrictions: Yes RLE Weight Bearing: Non weight bearing LLE Weight Bearing: Weight bearing as tolerated  Pain: no pain reported during group   Therapy/Group: Group Therapy  Barron Schmid 11/23/2021, 4:11 PM

## 2021-11-23 NOTE — Progress Notes (Signed)
Physical Therapy Session Note  Patient Details  Name: Valerie Le MRN: [id number] Date of Birth: [birth year]  Today's Date: 11/23/2021 PT Individual Time: 1134-1204 PT Individual Time Calculation (min): 30 min   Short Term Goals: Week 1:  PT Short Term Goal 1 (Week 1): Pt will perform sit<>stands using LRAD with CGA PT Short Term Goal 2 (Week 1): Pt will perform bed<>chair transfers using LRAD with CGA PT Short Term Goal 3 (Week 1): Pt will ambulate at least 63ft using LRAD with CGA PT Short Term Goal 4 (Week 1): Pt will navigate up/down 2 steps using HRs with CGA PT Short Term Goal 5 (Week 1): Pt will propel w/c at least 182ft witih supervision  Skilled Therapeutic Interventions/Progress Updates:    Pt received sitting in w/c with [name], SW arriving to update patient on Team Conference discussions. Discussed that pt's primary barrier to D/C is the 3 STE her apartment and currently the recommendation is non-emergent ambulance transport home due to safety concerns with this. Pt reports the step heights are 6", 6.5", and 7" in order with bilateral handrails and approximately 30inches of "run" on each step. Discussed option of utilizing a tub transfer bench to navigate the stairs and whether or not that would fit on the stairs safely and allow pt to navigate the stairs with family assistance for DME management. Transported pt to main therapy gym for time management and therapist set-up a tub transfer bench and placed it on the stairs to show pt how to off-set the leg lengths to allow for a safe, level transfer. Pt open to attempting this tomorrow with primary PT if they feel it is safe to do so. B UE w/c propulsion ~85ft back towards room with distant supervision. Transported remainder of distance for time management. Pt requesting to return to bed. Pt able to manage w/c leg rests with increased time and then manages to turn wheelchair to set-up for transfer with increased time for problem  solving and question cuing for safety of set-up - pt intermittently bumping R LE in limb guard on the bed and pt states th limb guard "messes with" her depth perception.. L partial stand pivot to EOB using w/c armrests and bedrail for support with close supervision for safety. Pt left in the bed with needs in reach and bed alarm on.  Therapy Documentation Precautions:  Precautions Precautions: Fall, Other (comment) Precaution Comments: R LE wound vac to BKA Restrictions Weight Bearing Restrictions: Yes RLE Weight Bearing: Non weight bearing LLE Weight Bearing: Weight bearing as tolerated   Pain:  No reports of pain throughout session.    Therapy/Group: Individual Therapy  Ginny Forth , PT, DPT, NCS, CSRS 11/23/2021, 8:02 AM

## 2021-11-23 NOTE — Progress Notes (Signed)
Occupational Therapy Session Note  Patient Details  Name: Valerie Le MRN: [id number] Date of Birth: [birth year]  Today's Date: 11/23/2021 OT Individual Time: 1015-1100 OT Individual Time Calculation (min): 45 min    Short Term Goals: Week 1:  OT Short Term Goal 1 (Week 1): Pt will complete toileting tasks with min assist OT Short Term Goal 2 (Week 1): Pt will complete stand pivot to [facility] with CGA OT Short Term Goal 3 (Week 1): Pt will complete LB dressing with min assist OT Short Term Goal 4 (Week 1): Pt will exhibit dynamic standing with CGA with unilateral release UE.  Skilled Therapeutic Interventions/Progress Updates:    Subjective: Pt states no pain throughout session.  Reports she found out from her husband that all doorways in apartment are 28 inches wide instead of previously stated 33 inches.      Objective:  Pt sitting up in w/c.  Pt self propelled ~150 feet to ADL suite with mod I.  Educated pt on environmental modifications to increase independence and safety.  Provided pt with walker bag and pt practiced ambulating a few feet to sink using RW then pivoting and returning to w/c with close supervision.  Pt self propelled w/c to bathroom and completed ambulatory level shower bench transfer with close supervision.  Self propelled back to room with mod I.  Call bell and needs in reach.    Assessment:  Pt exhibiting increased independence during tub bench transfer and able to complete at ambulatory level which is upgraded from stand/squat pivot.   Plan: Pt would benefit from further training on short distance ambulation due to small doorway width, pt will be unable to navigate using w/c into kitchen, bedroom, and bathroom.   Therapy Documentation Precautions:  Precautions Precautions: Fall, Other (comment) Precaution Comments: R LE wound vac to BKA Restrictions Weight Bearing Restrictions: Yes RLE Weight Bearing: Non weight bearing LLE Weight Bearing: Weight bearing as  tolerated    Therapy/Group: Individual Therapy  Amie Critchley 11/23/2021, 12:22 PM

## 2021-11-23 NOTE — Discharge Summary (Signed)
Physical Therapy Discharge Summary  Patient Details  Name: Valerie Le MRN: [id number] Date of Birth: 12-Sep-1970  Patient has met 9 of 11 long term goals due to improved activity tolerance, improved balance, increased strength, decreased pain, ability to compensate for deficits, and improved coordination.  Patient to discharge at a wheelchair level Supervision.   Patient's care partner is independent to provide the necessary physical assistance at discharge.  Reasons goals not met: Ambulation goal set to 37f, pt only able to hop 355fprior to fatigue. [facility] per [facility] setup. Home setup is 6-7inch steps. Pt only able to complete these using a tub bench but required minA for standing from bench height on the steps. Husband trained and demonstrated adequate ability for safe DC.   Recommendation:  Patient will benefit from ongoing skilled PT services in home health setting to continue to advance safe functional mobility, address ongoing impairments in dynamic standing balance, amputee education, general strengthening, home safety, and minimize fall risk.  Equipment: 22x18 manual w/c with R amputee pad, bariatric RW.   Reasons for discharge: treatment goals met and discharge from hospital  Patient/family agrees with progress made and goals achieved: Yes  PT Discharge Precautions/Restrictions Precautions Precautions: Fall Precaution Comments: R BKA Restrictions Weight Bearing Restrictions: Yes RLE Weight Bearing: Non weight bearing Vital Signs Therapy Vitals Temp: 97.8 F (36.6 C) Temp Source: Oral Pulse Rate: 91 Resp: 18 BP: (!) 150/86 Patient Position (if appropriate): Sitting Oxygen Therapy SpO2: 97 % O2 Device: Room Air Pain Pain Assessment Pain Scale: 0-10 Pain Score: 3  Pain Type: Acute pain Pain Location:  (R BKA) Pain Orientation: Right Pain Descriptors / Indicators: Aching Pain Intervention(s): Repositioned Multiple Pain Sites:  No Pain Interference Pain Interference Pain Effect on Sleep: 2. Occasionally Pain Interference with Therapy Activities: 1. Rarely or not at all Pain Interference with Day-to-Day Activities: 2. Occasionally  Vision/Perception  Vision - History Ability to See in Adequate Light: 0 Adequate Perception Perception: Within Functional Limits Praxis Praxis: Intact  Cognition Overall Cognitive Status: Within Functional Limits for tasks assessed Arousal/Alertness: Awake/alert Orientation Level: Oriented X4 Focused Attention: Appears intact Sustained Attention: Appears intact Selective Attention: Appears intact Memory: Appears intact Awareness: Appears intact Problem Solving: Appears intact Safety/Judgment: Appears intact Sensation Sensation Light Touch: Impaired Detail Peripheral sensation comments: pt reports hx of peripheral neuropathy but is able to feel ligh touch Light Touch Impaired Details: Impaired LLE Hot/Cold: Appears Intact Proprioception: Appears Intact Stereognosis: Appears Intact Coordination Gross Motor Movements are Fluid and Coordinated: No Coordination and Movement Description: impaired due to generalized weakness and R LE BKA with NWBing restrictions. Much improved since date of evaluation Motor  Motor Motor: Other (comment) Motor - Skilled Clinical Observations: generalized weakness with impaired balance due to R LE BKA with NWBing restrictions  Mobility Bed Mobility Supine to Sit: Independent with assistive device Sit to Supine: Independent with assistive device Transfers Transfers: Sit to Stand;Stand to Sit;Stand Pivot Transfers;Squat Pivot Transfers Sit to Stand: Supervision/Verbal cueing Stand to Sit: Supervision/Verbal cueing Stand Pivot Transfers: Supervision/Verbal cueing Stand Pivot Transfer Details: Verbal cues for precautions/safety Transfer (Assistive device): Rolling walker Locomotion  Gait Ambulation: Yes Gait Assistance: Supervision/Verbal  cueing Gait Distance (Feet): 30 Feet Assistive device: Rolling walker Gait Assistance Details: Verbal cues for precautions/safety;Verbal cues for gait pattern;Verbal cues for technique Gait Gait: Yes Gait Pattern: Impaired Gait Pattern:  (hop to pattern 2/2 BKA) Stairs / Additional Locomotion Stairs: Yes Stairs Assistance: Minimal Assistance - Patient >  75% Stair Management Technique: Sideways;One rail Right;Other (comment) (tub bench) Number of Stairs: 5 Height of Stairs: 6 Wheelchair Mobility Wheelchair Mobility: Yes Wheelchair Assistance: Independent with Camera operator: Both upper extremities Wheelchair Parts Management: Independent (with extra time) Distance: 190f  Trunk/Postural Assessment  Cervical Assessment Cervical Assessment: Within Functional Limits Thoracic Assessment Thoracic Assessment: Within Functional Limits Lumbar Assessment Lumbar Assessment: Within Functional Limits Postural Control Postural Control: Deficits on evaluation Postural Limitations: Reliant on UE support for standing balance  Balance Balance Balance Assessed: Yes Static Sitting Balance Static Sitting - Balance Support: No upper extremity supported;Feet supported Static Sitting - Level of Assistance: 7: Independent Dynamic Sitting Balance Dynamic Sitting - Balance Support: Feet supported;No upper extremity supported Dynamic Sitting - Level of Assistance: 7: Independent Static Standing Balance Static Standing - Balance Support: Bilateral upper extremity supported Static Standing - Level of Assistance: 6: Modified independent (Device/Increase time) Dynamic Standing Balance Dynamic Standing - Balance Support: Bilateral upper extremity supported;During functional activity Dynamic Standing - Level of Assistance: 5: Stand by assistance Extremity Assessment      RLE Assessment RLE Assessment: Exceptions to [facility] CenterActive Range of Motion (AROM) Comments: WFL, no knee ROM  restrictions noted at this time with pt able to achieve full extension and > 90 degrees of flexion General Strength Comments: Grossly 4+/5 LLE Assessment LLE Assessment: Within Functional Limits    Valerie Le Valerie Le PT 11/23/2021, 3:23 PM

## 2021-11-24 LAB — GLUCOSE, CAPILLARY
Glucose-Capillary: 168 mg/dL — ABNORMAL HIGH (ref 70–99)
Glucose-Capillary: 129 mg/dL — ABNORMAL HIGH (ref 70–99)
Glucose-Capillary: 150 mg/dL — ABNORMAL HIGH (ref 70–99)
Glucose-Capillary: 148 mg/dL — ABNORMAL HIGH (ref 70–99)
Glucose-Capillary: 141 mg/dL — ABNORMAL HIGH (ref 70–99)

## 2021-11-24 MED ORDER — HYDROXYZINE HCL 25 MG PO TABS
25.0000 mg | ORAL_TABLET | Freq: Three times a day (TID) | ORAL | Status: DC | PRN
Start: 2021-11-24 — End: 2021-11-25

## 2021-11-24 NOTE — Progress Notes (Signed)
PROGRESS NOTE   Subjective/Complaints: Complains of itching ar residual limb site, discussed could be related to neuropathy- discussed trying hydroxyzine Happy with d/c date   ROS: +pain in medial aspect of right knee, +canker sore- still dry, +constipation- resolved, minimal residual limb pain, +itchiness in residual limb.    Objective:   No results found. No results for input(s): "WBC", "HGB", "HCT", "PLT" in the last 72 hours.   No results for input(s): "NA", "K", "CL", "CO2", "GLUCOSE", "BUN", "CREATININE", "CALCIUM" in the last 72 hours.    Intake/Output Summary (Last 24 hours) at 11/24/2021 1013 Last data filed at 11/24/2021 0735 Gross per 24 hour  Intake 956 ml  Output --  Net 956 ml        Physical Exam: Vital Signs Blood pressure (!) 129/59, pulse 89, temperature 98.8 F (37.1 C), temperature source Oral, resp. rate 16, height 5\' 11"  (1.803 m), weight 119.7 kg, last menstrual period 10/04/2021, SpO2 95 %. Gen: no distress, normal appearing, BMI 36.81 HEENT: oral mucosa pink and moist, NCAT Cardio: Reg rate Chest: normal effort, normal rate of breathing Abd: soft, non-distended Ext: Right BKA wound VAC in place with good seal; no output in canister. Semirigid BKA protector in place She has Omnipod insulin pump LUE Psych: Pt's affect is appropriate. Pt is cooperative. Pleasant. A little anxious Skin: Clean and intact without signs of breakdown Neuro:  Follows commands and answers questions. Speech normal. Sensation intact to LT in all 4 extremities. She reports slightly altered sensation in LLE (chronic due to DM) CN 2-12 intact Musculoskeletal: 5/5 in B/L UE and LLE strength. Able to move RLE at hip.  Normal tone and bulk. No joint swelling noted.  Functional mobility: Can don limb guard set-up. Able to perform prone exercises. Hopping with CS with RW. Can manage her wheelchair rests.     Assessment/Plan: 1. Functional deficits which require 3+ hours per day of interdisciplinary therapy in a comprehensive inpatient rehab setting. Physiatrist is providing close team supervision and 24 hour management of active medical problems listed below. Physiatrist and rehab team continue to assess barriers to discharge/monitor patient progress toward functional and medical goals  Care Tool:  Bathing    Body parts bathed by patient: Right arm, Left upper leg, Left arm, Chest, Abdomen, Left lower leg, Front perineal area, Face, Buttocks, Right upper leg     Body parts n/a: Right lower leg   Bathing assist Assist Level: Supervision/Verbal cueing     Upper Body Dressing/Undressing Upper body dressing   What is the patient wearing?: Pull over shirt    Upper body assist Assist Level: Independent    Lower Body Dressing/Undressing Lower body dressing      What is the patient wearing?: Underwear/pull up     Lower body assist Assist for lower body dressing: Minimal Assistance - Patient > 75%     Toileting Toileting    Toileting assist Assist for toileting: Contact Guard/Touching assist     Transfers Chair/bed transfer  Transfers assist     Chair/bed transfer assist level: Minimal Assistance - Patient > 75% Chair/bed transfer assistive device: Programmer, multimedia   Ambulation assist  Assist level: Minimal Assistance - Patient > 75% Assistive device: Walker-rolling Max distance: 51ft   Walk 10 feet activity   Assist  Walk 10 feet activity did not occur: Safety/medical concerns        Walk 50 feet activity   Assist Walk 50 feet with 2 turns activity did not occur: Safety/medical concerns         Walk 150 feet activity   Assist Walk 150 feet activity did not occur: Safety/medical concerns         Walk 10 feet on uneven surface  activity   Assist Walk 10 feet on uneven surfaces activity did not occur: Safety/medical  concerns         Wheelchair     Assist Is the patient using a wheelchair?: Yes Type of Wheelchair: Manual    Wheelchair assist level: Supervision/Verbal cueing Max wheelchair distance: 116ft    Wheelchair 50 feet with 2 turns activity    Assist        Assist Level: Supervision/Verbal cueing   Wheelchair 150 feet activity     Assist      Assist Level: Minimal Assistance - Patient > 75%   Blood pressure (!) 129/59, pulse 89, temperature 98.8 F (37.1 C), temperature source Oral, resp. rate 16, height 5\' 11"  (1.803 m), weight 119.7 kg, last menstrual period 10/04/2021, SpO2 95 %.    Medical Problem List and Plan: 1. 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              -patient may not shower due to wound vac             -ELOS/Goals: 7-10 days  -Wound vac removed without complication.   Continue CIR  2.  Impaired mobility: continue Lovenox             -antiplatelet therapy: Aspirin 81 mg 3. Pain along medial aspect of right knee: continue Voltaren gel ordered prn. Tylenol and oxycodone as needed, Neurontin 300 mg 3 times daily, amitriptyline 25 mg at bedtime>>migraine prevention             -Monitor for phantom pain and sensation. Provided with pain relief journal.  4. Mood: LCSW to evaluate and provide emotional support             -antipsychotic agents: n/a 5. Neuropsych: This patient is capable of making decisions on her own behalf. 6. Skin/Wound Care: Routine skin care checks 7. Fluids/Electrolytes/Nutrition: Routine I's and O's and follow-up chemistries 8: DM on insulin pump: Continue current treatment with insulin pump, CBG with fair control, continue to monitor. Recommended 1 TB apple cider vinegar in a glass of water before meals.  9: Hypertension: Continue amlodipine to 5mg  and lisinopril. Provided list of foods to lower blood pressure.  -BP overall good control 10: Hyperlipidemia: continue  Lipitor 12: Iron deficiency anemia and acute blood loss anemia: continue ferous sulfate and vitamin C. Last Hgb 7.2, repeat Hgb Monday 13: Tobacco use: continue Chantix at bedtime 14: History of migraine headache: none recently; Elavil at night for prevention 15: Obesity BMI 36.81: provide dietary education. Provided list of foods to help with weight loss.  16: Leukocytosis: improving, discussed with patient. Repeat WBC Monday.  17. Transaminitis: d/c Tylenol.  18. Canker sore: Blistex ordered 19. Dry lips: vaseline ordered.  20. Constipation: resolved. Continue milk of mag prn ordered. Provided list of foods to help with constipation.  21. Itching of residual limb: hydroxyzine 25mg  TID  PRN ordered.      LOS: 8 days A FACE TO FACE EVALUATION WAS PERFORMED  Martha Clan P Teddy Pena 11/24/2021, 10:13 AM

## 2021-11-24 NOTE — Progress Notes (Signed)
Physical Therapy Session Note  Patient Details  Name: [name] MRN: [id number] Date of Birth: 22-Apr-1971  Today's Date: 11/24/2021 PT Individual Time: 0945-1100 + 1500-1530 PT Individual Time Calculation (min): 75 min + 30 min  Short Term Goals: Week 1:  PT Short Term Goal 1 (Week 1): Pt will perform sit<>stands using LRAD with CGA PT Short Term Goal 2 (Week 1): Pt will perform bed<>chair transfers using LRAD with CGA PT Short Term Goal 3 (Week 1): Pt will ambulate at least 29f using LRAD with CGA PT Short Term Goal 4 (Week 1): Pt will navigate up/down 2 steps using HRs with CGA PT Short Term Goal 5 (Week 1): Pt will propel w/c at least [id number] supervision  Skilled Therapeutic Interventions/Progress Updates:      1st session: Pt sitting EOB to start - agreeable to PT tx. Denies pain. Focused session on continued problem solving for home entrance. Retrieved bariatric sized shower tub bench with plan to use for steps. Sit<>stand to RW with supervision and completed stand<>pivot transfer with supervision and RW to w/c. Transported outdoors (outside AHPraxairto practice steps to simulate home entrance. Reviewed with demonstration to improve confidence and understanding of technique. Had +2 assist on standby for safety reasons. She went up/down x5, 6inch steps using tub bench method - minA for standing to rise from tub bench. Pt reported feeling confident with ability to do this at home. Her husband will be here in the PM to get hands on training for this. Returned [facility] to [facility]. Practiced car transfer with car height simulating [place] She completed car transfer with setupA with understanding of sequencing, safety, and technique. UE ergometer completed for 10 minutes with resistance set to 4, targeting UE strengthening and cardiovascular endurance. Returned to her room in w/c and completed ambulatory transfer with supervision and RW back to bed.  Remained seated EOB with all needs met. Direct handoff of care to TR with pet therapy.    2nd session: Pt sitting EOB to start. Family (husband, daughter, son) at bedside. Focused session on family training and stair training. Pt donned limb guard with setupA. Sit<>stand to RW with supervision. Stand<>pivot transfer to w/c with supervision and RW. Transported outside as above to practice stair training. Used tub-bench and family assisted with tub bench management, pt navigated up/down x5, 6inch steps using tub-bench method. Educated them on sequencing, guarding, technique, and precautions. They all voiced understanding and felt comfortable with this at home. Pt returned upstairs and completed similar transfer back to bed. Remained in bed with all needs met, family at bedside.    Therapy Documentation Precautions:  Precautions Precautions: Fall Precaution Comments: R BKA Required Braces or Orthoses: Other Brace Restrictions Weight Bearing Restrictions: Yes RLE Weight Bearing: Non weight bearing LLE Weight Bearing: Weight bearing as tolerated General:    Therapy/Group: Individual Therapy  ChAlger Simons/01/2022, 10:42 AM

## 2021-11-24 NOTE — Progress Notes (Signed)
Recreational Therapy Session Note  Patient Details  Name: Valerie Le MRN: [id number] Date of Birth: 11/23/1970 Today's Date: 11/24/2021  No c/o pain, T Pt participated in animal assisted activity seated EOB with supervision.  Pt easily engaged with pet partner team and appreciative of this visit.     Eilee Schader 11/24/2021, 12:01 PM

## 2021-11-24 NOTE — Progress Notes (Incomplete)
Inpatient Rehabilitation Discharge Medication Review by a Pharmacist  A complete drug regimen review was completed for this patient to identify any potential clinically significant medication issues.  High Risk Drug Classes Is patient taking? Indication by Medication  Antipsychotic {Receiving?:26196}   Anticoagulant {Receiving?:26196}   Antibiotic {Receiving?:26196}   Opioid {Receiving?:26196}   Antiplatelet {Receiving?:26196}   Hypoglycemics/insulin {Yes or No?:26198}   Vasoactive Medication {Receiving?:26196}   Chemotherapy {Receiving Chemo?:26197}   Other {Yes or No?:26198}      Type of Medication Issue Identified Description of Issue Recommendation(s)  Drug Interaction(s) (clinically significant)     Duplicate Therapy     Allergy     No Medication Administration End Date     Incorrect Dose     Additional Drug Therapy Needed     Significant med changes from prior encounter (inform family/care partners about these prior to discharge).    Other       Clinically significant medication issues were identified that warrant physician communication and completion of prescribed/recommended actions by midnight of the next day:  {Yes or No?:26198}  Name of provider notified for urgent issues identified: ***   Provider Method of Notification: ***    Pharmacist comments: ***   Time spent performing this drug regimen review (minutes): 20   Thank you for allowing pharmacy to be a part of this patient's care.  San Lohmeyer, PharmD Clinical Pharmacist  

## 2021-11-24 NOTE — Progress Notes (Signed)
Inpatient Rehabilitation Care Coordinator Discharge Note   Patient Details  Name: Valerie Le MRN: [id number] Date of Birth: 03/18/1971   Discharge location: HOME WITH HUSBAND AND CHILDREN-24/7 SUPERVISON  Length of Stay: 9 DAYS  Discharge activity level: MOD/I-SUPERVISION LEVEL  Home/community participation: ACTIVE  Patient response KG:YJEHUD Literacy - How often do you need to have someone help you when you read instructions, pamphlets, or other written material from your doctor or pharmacy?: Never  Patient response JS:HFWYOV Isolation - How often do you feel lonely or isolated from those around you?: Sometimes  Services provided included: MD, RD, PT, OT, RN, CM, TR, Pharmacy, SW  Financial Services:  Field seismologist Utilized: Engineer, agricultural offered to/list presented to: PT  Follow-up services arranged:  Home Health, DME, Patient/Family has no preference for HH/DME agencies Home Health Agency: [facility]-PT OT    DME : [facility]-WHEEL[facility], DROP-ARM BEDSIDE COMMODE WILL GET ROLLING WALKER ON OWN TUB BENCH    Patient response to transportation need: Is the patient able to respond to transportation needs?: Yes In the past 12 months, has lack of transportation kept you from medical appointments or from getting medications?: No In the past 12 months, has lack of transportation kept you from meetings, work, or from getting things needed for daily living?: No    Comments (or additional information):HUSBAND CAME AND DID THE STAIRS WITH PT WITH THE TUB BENCH. PT DID WELL AND PROGRESSED QUICKLY. STD PAPERWORK COMPLETED  Patient/Family verbalized understanding of follow-up arrangements:  Yes  Individual responsible for coordination of the follow-up plan: SELF AND Valerie Le [phone]  Confirmed correct DME delivered: Lucy Chris 11/24/2021    Teneshia Hedeen, Lemar Livings

## 2021-11-24 NOTE — Progress Notes (Signed)
Occupational Therapy Discharge Summary  Patient Details  Name: Valerie Le MRN: [id number] Date of Birth: February 09, 1971  Today's Date: 11/24/2021 OT Individual Time: 5456-2563 OT Individual Time Calculation (min): 50 min  Grad day - Self care retraining at shower level. Pt gathed items mod I and ambulated into the bathroom to shower. PT stopped at toilet with wide BSC over the top to void mod I. A to wrap residual limb for showering and educated on how to do it. Pt showered mod I. Donned shirt in the bathroom and ambulated back out to the bed to don LB clothing. Pt is ready for d/c tomorrow.    Patient has met 10 of 10 long term goals due to improved activity tolerance, improved balance, postural control, ability to compensate for deficits, and improved coordination.  Patient to discharge at overall Modified Independent level.  Patient's care partner is independent to provide the necessary physical assistance at discharge for IADLs and pt can direct this as needed    Reasons goals not met: n/a  Recommendation:  Patient will benefit from ongoing skilled OT services in home health setting to continue to advance functional skills in the area of BADL, iADL, and Reduce care partner burden.  Equipment: Recommended to get a tub bench and BSC- Pt was going to purchase on her own. Pt was going to  get a w/c and borrow a RW  Reasons for discharge: treatment goals met and discharge from hospital  Patient/family agrees with progress made and goals achieved: Yes  OT Discharge Precautions/Restrictions  Precautions Precaution Comments: R BKA Required Braces or Orthoses: Other Brace General   Vital Signs Therapy Vitals Temp: 98.8 F (37.1 C) Temp Source: Oral Pulse Rate: 89 Resp: 16 BP: (!) 129/59 Patient Position (if appropriate): Lying Oxygen Therapy SpO2: 95 % O2 Device: Room Air Pain  No indications of pain in session  ADL ADL Eating: Independent Grooming:  Independent Where Assessed-Grooming: Sitting at sink Upper Body Bathing: Modified independent Where Assessed-Upper Body Bathing: Shower Lower Body Bathing: Modified independent Where Assessed-Lower Body Bathing: Standing at sink, Sitting at sink Upper Body Dressing: Modified independent (Device) Where Assessed-Upper Body Dressing: Sitting at sink Lower Body Dressing: Modified independent Where Assessed-Lower Body Dressing: Standing at sink, Sitting at sink Toileting: Modified independent Where Assessed-Toileting: Bedside Commode Toilet Transfer: Modified independent Toilet Transfer Method: Engineer, water: Extra wide Geophysical data processor: Modified independent Vision Baseline Vision/History: 1 Wears glasses Patient Visual Report: No change from baseline Vision Assessment?: No apparent visual deficits Perception  Perception: Within Functional Limits Praxis Praxis: Intact Cognition Cognition Overall Cognitive Status: Within Functional Limits for tasks assessed Arousal/Alertness: Awake/alert Orientation Level: Person;Place;Situation Person: Oriented Place: Oriented Situation: Oriented Memory: Appears intact Attention: Selective Focused Attention: Appears intact Sustained Attention: Appears intact Selective Attention: Appears intact Awareness: Appears intact Problem Solving: Appears intact Safety/Judgment: Appears intact Brief Interview for Mental Status (BIMS) Repetition of Three Words (First Attempt): 3 Temporal Orientation: Year: Correct Temporal Orientation: Month: Accurate within 5 days Temporal Orientation: Day: Correct Recall: "Sock": Yes, no cue required Recall: "Blue": Yes, no cue required Recall: "Bed": No, could not recall BIMS Summary Score: 13 Sensation Sensation Peripheral sensation comments: pt reports hx of peripheral neuropathy but is able to feel ligh touch Light Touch Impaired Details: Impaired LLE Hot/Cold:  Appears Intact Proprioception: Appears Intact Stereognosis: Appears Intact Coordination Gross Motor Movements are Fluid and Coordinated: Yes Fine Motor Movements are Fluid and Coordinated: Yes Motor  Motor Motor: Within Functional  Limits Motor - Discharge Observations: improved - ability to compensate for limb loss Mobility  Bed Mobility Bed Mobility: Supine to Sit;Sit to Supine Supine to Sit: Independent with assistive device Sit to Supine: Independent with assistive device Transfers Sit to Stand: Independent with assistive device Stand to Sit: Independent with assistive device  Trunk/Postural Assessment  Cervical Assessment Cervical Assessment: Within Functional Limits Thoracic Assessment Thoracic Assessment: Within Functional Limits Lumbar Assessment Lumbar Assessment: Within Functional Limits Postural Control Postural Control: Within Functional Limits  Balance Static Sitting Balance Static Sitting - Level of Assistance: 7: Independent Dynamic Sitting Balance Dynamic Sitting - Balance Support: Feet supported;No upper extremity supported Dynamic Sitting - Level of Assistance: 6: Modified independent (Device/Increase time) Static Standing Balance Static Standing - Balance Support: During functional activity Static Standing - Level of Assistance: 6: Modified independent (Device/Increase time) Dynamic Standing Balance Dynamic Standing - Balance Support: Bilateral upper extremity supported;During functional activity Dynamic Standing - Level of Assistance: 5: Stand by assistance Extremity/Trunk Assessment RUE Assessment RUE Assessment: Within Functional Limits LUE Assessment LUE Assessment: Within Functional Limits   [facility] 11/24/2021, 8:12 AM

## 2021-11-24 NOTE — Progress Notes (Signed)
Occupational Therapy Session Note  Patient Details  Name: Valerie Le MRN: [id number] Date of Birth: [birth year]  Today's Date: 11/24/2021 OT Individual Time: 0034-9179 OT Individual Time Calculation (min): 48 min    Short Term Goals: Week 1:  OT Short Term Goal 1 (Week 1): Pt will complete toileting tasks with min assist OT Short Term Goal 2 (Week 1): Pt will complete stand pivot to [facility] with CGA OT Short Term Goal 3 (Week 1): Pt will complete LB dressing with min assist OT Short Term Goal 4 (Week 1): Pt will exhibit dynamic standing with CGA with unilateral release UE.  Skilled Therapeutic Interventions/Progress Updates:  Pt greeted seated EOB eating lunch. Noted pts w/c arrived, pt completed squat pivot to w/c with supervision. Adjusted leg rests and removed back support as pt felt like it was pushing her too far forward in w/c.  Education provided on donning/doffing leg rests and recommendation of removing leg rests during all transfers. Pt able to return demonstrate management of leg rests as well as removing arm rests for transfers and locking/unlocking brakes. Pt completed w/c mobility into bathroom for toileting with MIN A to maneuver over incline in doorway. pt completed squat pivot to toilet with grab bars with supervision. MOD I for toileting tasks. Pt completed w/c mobility down hallway ~ 20 ft to test out w/c with pt reporting satisfaction. In gym pt completed dynamic standing balance task where pt instructed to sit>stand from w/c with Rw and CGA to stand and creat leggo structure from visual aid provided. Pt able to stand with no UE support to complete task with CGA.pt completed w/c propulsion back to room where pt completed squat pivot back to bed MOD I. Pt left seated EOB with all needs within reach.    Therapy Documentation Precautions:  Precautions Precautions: Fall Precaution Comments: R BKA Required Braces or Orthoses: Other Brace Restrictions Weight Bearing  Restrictions: Yes RLE Weight Bearing: Non weight bearing LLE Weight Bearing: Weight bearing as tolerated  Pain: no pain reported during session     Therapy/Group: Individual Therapy  Barron Schmid 11/24/2021, 3:45 PM

## 2021-11-25 ENCOUNTER — Telehealth: Payer: Self-pay

## 2021-11-25 LAB — GLUCOSE, CAPILLARY
Glucose-Capillary: 175 mg/dL — ABNORMAL HIGH (ref 70–99)
Glucose-Capillary: 144 mg/dL — ABNORMAL HIGH (ref 70–99)

## 2021-11-25 MED ORDER — PANTOPRAZOLE SODIUM 40 MG PO TBEC: 40.0000 mg | DELAYED_RELEASE_TABLET | Freq: Every day | ORAL | 0 refills | Status: DC

## 2021-11-25 MED ORDER — OXYCODONE HCL 5 MG PO TABS: 5.0000 mg | ORAL_TABLET | ORAL | 0 refills | Status: DC | PRN

## 2021-11-25 MED ORDER — AMLODIPINE BESYLATE 5 MG PO TABS: 5.0000 mg | ORAL_TABLET | Freq: Every day | ORAL | 0 refills | Status: DC

## 2021-11-25 MED ORDER — HYDROXYZINE HCL 25 MG PO TABS: 25.0000 mg | ORAL_TABLET | Freq: Three times a day (TID) | ORAL | 0 refills | Status: AC | PRN

## 2021-11-25 MED ORDER — AMITRIPTYLINE HCL 25 MG PO TABS: 25.0000 mg | ORAL_TABLET | Freq: Every day | ORAL | 0 refills | Status: DC

## 2021-11-25 MED ORDER — LISINOPRIL 40 MG PO TABS: 40.0000 mg | ORAL_TABLET | Freq: Every day | ORAL | 0 refills | Status: DC

## 2021-11-25 MED ORDER — VARENICLINE TARTRATE 1 MG PO TABS: 1.0000 mg | ORAL_TABLET | Freq: Every day | ORAL | 0 refills | Status: DC

## 2021-11-25 NOTE — Progress Notes (Signed)
PROGRESS NOTE   Subjective/Complaints: Pt going home today. No concerns or complaints.    ROS: +pain in medial aspect of right knee, +canker sore- still dry, +constipation- resolved, minimal residual limb pain, +itchiness in residual limb.  No fever or chills. No CP or SOB.    Objective:   No results found. No results for input(s): "WBC", "HGB", "HCT", "PLT" in the last 72 hours.   No results for input(s): "NA", "K", "CL", "CO2", "GLUCOSE", "BUN", "CREATININE", "CALCIUM" in the last 72 hours.    Intake/Output Summary (Last 24 hours) at 11/25/2021 0844 Last data filed at 11/25/2021 0754 Gross per 24 hour  Intake 537 ml  Output --  Net 537 ml         Physical Exam: Vital Signs Blood pressure (!) 140/91, pulse 96, temperature 98 F (36.7 C), temperature source Oral, resp. rate 18, height 5\' 11"  (1.803 m), weight 119.7 kg, last menstrual period 10/04/2021, SpO2 94 %. 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 Pt is cooperative. Pleasant. A little anxious Skin: Clean and intact without signs of breakdown Neuro:  Follows commands and answers questions. Speech normal. Sensation intact to LT in all 4 extremities. She reports slightly altered sensation in LLE (chronic due to DM) CN 2-12 grossly intact Musculoskeletal: 5/5 in B/L UE and LLE strength. Able to move RLE at hip.  Normal tone and bulk. No joint swelling noted.  Functional mobility: Can don limb guard set-up. Able to perform prone exercises. Hopping with CS with RW. Can manage her wheelchair rests.    Assessment/Plan: 1. Functional deficits which require 3+ hours per day of interdisciplinary therapy in a  comprehensive inpatient rehab setting. Physiatrist is providing close team supervision and 24 hour management of active medical problems listed below. Physiatrist and rehab team continue to assess barriers to discharge/monitor patient progress toward functional and medical goals  Care Tool:  Bathing    Body parts bathed by patient: Right arm, Left upper leg, Left arm, Chest, Abdomen, Left lower leg, Front perineal area, Face, Buttocks, Right upper leg     Body parts n/a: Right lower leg   Bathing assist Assist Level: Supervision/Verbal cueing     Upper Body Dressing/Undressing Upper body dressing   What is the patient wearing?: Pull over shirt    Upper body assist Assist Level: Independent    Lower Body Dressing/Undressing Lower body dressing      What is the patient wearing?: Underwear/pull up     Lower body assist Assist for lower body dressing: Minimal Assistance - Patient > 75%     Toileting Toileting    Toileting assist Assist for toileting: Contact Guard/Touching assist     Transfers Chair/bed transfer  Transfers assist     Chair/bed transfer assist level: Supervision/Verbal cueing Chair/bed transfer assistive device: 10/06/2021   Ambulation assist      Assist level: Contact Guard/Touching assist Assistive device: Walker-rolling Max  distance: 72ft   Walk 10 feet activity   Assist  Walk 10 feet activity did not occur: Safety/medical concerns  Assist level: Contact Guard/Touching assist Assistive device: Walker-rolling   Walk 50 feet activity   Assist Walk 50 feet with 2 turns activity did not occur: Safety/medical concerns         Walk 150 feet activity   Assist Walk 150 feet activity did not occur: Safety/medical concerns         Walk 10 feet on uneven surface  activity   Assist Walk 10 feet on uneven surfaces activity did not occur: Safety/medical concerns         Wheelchair     Assist Is  the patient using a wheelchair?: Yes Type of Wheelchair: Manual    Wheelchair assist level: Independent Max wheelchair distance: 175ft    Wheelchair 50 feet with 2 turns activity    Assist        Assist Level: Independent   Wheelchair 150 feet activity     Assist      Assist Level: Independent   Blood pressure (!) 140/91, pulse 96, temperature 98 F (36.7 C), temperature source Oral, resp. rate 18, height 5\' 11"  (1.803 m), weight 119.7 kg, last menstrual period 10/04/2021, SpO2 94 %.    Medical Problem List and Plan: 1. 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              -patient may not shower due to wound vac             -ELOS/Goals: 7-10 days  -Home today 2.  Impaired mobility: continue Lovenox             -antiplatelet therapy: Aspirin 81 mg 3. Pain along medial aspect of right knee: continue Voltaren gel ordered prn. Tylenol and oxycodone as needed, Neurontin 300 mg 3 times daily, amitriptyline 25 mg at bedtime>>migraine prevention             -Monitor for phantom pain and sensation. Provided with pain relief journal.  4. Mood: LCSW to evaluate and provide emotional support             -antipsychotic agents: n/a 5. Neuropsych: This patient is capable of making decisions on her own behalf. 6. Skin/Wound Care: Routine skin care checks 7. Fluids/Electrolytes/Nutrition: Routine I's and O's and follow-up chemistries 8: DM on insulin pump: Continue current treatment with insulin pump, CBG with fair control, continue to monitor. Recommended 1 TB apple cider vinegar in a glass of water before meals.  9: Hypertension: Continue amlodipine to 5mg  and lisinopril. Provided list of foods to lower blood pressure.  -6/9 BP well controlled, continue current medications, follow with PCP 10: Hyperlipidemia: continue Lipitor 12: Iron deficiency anemia and acute blood loss anemia: continue ferous sulfate and vitamin C. Last Hgb  7.2 -HBG 7.7, stable on 6/5, follow with PCP 13: Tobacco use: continue Chantix at bedtime 14: History of migraine headache: none recently; Elavil at night for prevention 15: Obesity BMI 36.81: provide dietary education. Provided list of foods to help with weight loss.  16: Leukocytosis: improving, discussed with patient.   -WBC down to 12.8 6/5 on 6/5, follow with PCP 17. Transaminitis: d/c Tylenol.  18. Canker sore: Blistex ordered 19. Dry lips: vaseline ordered.  20. Constipation: resolved. Continue milk of mag prn ordered. Provided list of foods to help with constipation.  21. Itching of residual limb: hydroxyzine 25mg  TID PRN ordered.  LOS: 9 days A FACE TO FACE EVALUATION WAS PERFORMED  Fanny DanceYuri Taiden Raybourn 11/25/2021, 8:44 AM

## 2021-11-25 NOTE — Telephone Encounter (Signed)
Pharmacy called stating that Chantix package size only comes in #56 tablets and pharmacy will not break package up so is it ok to change to #56 instead of #30?

## 2021-11-25 NOTE — Progress Notes (Signed)
Inpatient Rehabilitation Discharge Medication Review by a Pharmacist  A complete drug regimen review was completed for this patient to identify any potential clinically significant medication issues.  High Risk Drug Classes Is patient taking? Indication by Medication  Antipsychotic No   Anticoagulant No   Antibiotic No   Opioid Yes Oxycodone: PRN pain  Antiplatelet Yes Aspirin: stroke ppx  Hypoglycemics/insulin Yes Insulin pump (lispro), Trulicity: DM  Vasoactive Medication Yes Amlodipine, lisinopril: HTN  Chemotherapy No   Other Yes Amitriptyline: mood Atorvastatin: HLD Gabapentin: neuropathy/pain Glucagon: PRN hypoglycemia Hydroxyzine: PRN itching Ondansetron: PRN nausea/vomiting Pantoprazole: GERD ppx Sumatriptan: PRN migraine Varenicline: smoking cessation     Type of Medication Issue Identified Description of Issue Recommendation(s)  Drug Interaction(s) (clinically significant)     Duplicate Therapy     Allergy     No Medication Administration End Date     Incorrect Dose     Additional Drug Therapy Needed     Significant med changes from prior encounter (inform family/care partners about these prior to discharge). Medications discontinued: - insulin glargine (Toujeo), budesonide inh, reglan, nitro patch, percocet (replaced with oxycodone) Communicate medication changes with patient/family at discharge  Other       Clinically significant medication issues were identified that warrant physician communication and completion of prescribed/recommended actions by midnight of the next day:  No  Pharmacist comments: n/a   Time spent performing this drug regimen review (minutes): 20   Thank you for allowing pharmacy to be a part of this patient's care.  Ardyth Harps, PharmD Clinical Pharmacist

## 2021-12-01 ENCOUNTER — Ambulatory Visit (INDEPENDENT_AMBULATORY_CARE_PROVIDER_SITE_OTHER): Payer: Managed Care, Other (non HMO) | Admitting: Orthopedic Surgery

## 2021-12-01 DIAGNOSIS — S88111A Complete traumatic amputation at level between knee and ankle, right lower leg, initial encounter: Secondary | ICD-10-CM

## 2021-12-04 ENCOUNTER — Encounter: Payer: Self-pay | Admitting: Orthopedic Surgery

## 2021-12-04 NOTE — Progress Notes (Signed)
Office Visit Note   Patient: Valerie Le           Date of Birth: 03-31-1971           MRN: [id number] Visit Date: 12/01/2021              Requested by: Maud Deed, [place] [address] [place],  [place] [address] PCP: Maud Deed, [place]  Chief Complaint  Patient presents with   Right Leg - Routine Post Op    11/11/2021 right BKA kerecis implant       HPI: Patient is a 51 year old woman who is 3 weeks status post right transtibial amputation with Kerecis tissue graft support.  She is wearing her limb protector and shrinker.  Assessment & Plan: Visit Diagnoses:  1. Below-knee amputation of right lower extremity (HCC)     Plan: Staples are harvested.  Patient was provided a prescription for a prosthesis with [employer].  Follow-Up Instructions: Return in about 2 weeks (around 12/15/2021).   Ortho Exam  Patient is alert, oriented, no adenopathy, well-dressed, normal affect, normal respiratory effort. Examination the incision is well-healed patient states she does have some phantom pain she is using gabapentin.  Patient's amputation is not a pre-existing condition this is a new problem secondary to her diabetes.  Patient is a new right transtibial  amputee.  Patient's current comorbidities are not expected to impact the ability to function with the prescribed prosthesis. Patient verbally communicates a strong desire to use a prosthesis. Patient currently requires mobility aids to ambulate without a prosthesis.  Expects not to use mobility aids with a new prosthesis.  Patient is a K2 level ambulator that will use a prosthesis to walk around their home and the community over low level environmental barriers.      Imaging: No results found. No images are attached to the encounter.  Labs: Lab Results  Component Value Date   HGBA1C 7.4 (H) 11/11/2021   HGBA1C 13.1 (H) 12/02/2020   ESRSEDRATE 84 (H) 11/02/2021   ESRSEDRATE 72 (H)  06/15/2021   CRP 24.7 (H) 06/15/2021   REPTSTATUS 11/07/2021 FINAL 11/02/2021   GRAMSTAIN  06/29/2021    RARE WBC PRESENT, PREDOMINANTLY MONONUCLEAR NO ORGANISMS SEEN    CULT  11/02/2021    NO GROWTH 5 DAYS Performed at [facility], [address], [place], [place] [address]    [facility] STAPHYLOCOCCUS EPIDERMIDIS 06/29/2021   LABORGA STAPHYLOCOCCUS LUGDUNENSIS 06/29/2021     Lab Results  Component Value Date   ALBUMIN 1.9 (L) 11/17/2021   ALBUMIN 2.1 (L) 11/03/2021   ALBUMIN 2.6 (L) 12/03/2020   PREALBUMIN 11.4 (L) 11/11/2021    Lab Results  Component Value Date   MG 1.9 11/11/2021   Lab Results  Component Value Date   VD25OH 33.19 11/17/2021   VD25OH 32.37 11/11/2021    Lab Results  Component Value Date   PREALBUMIN 11.4 (L) 11/11/2021      Latest Ref Rng & Units 11/21/2021    6:07 AM 11/17/2021    5:20 AM 11/13/2021    3:13 AM  CBC EXTENDED  WBC 4.0 - 10.5 K/uL 12.8  14.7  17.7   RBC 3.87 - 5.11 MIL/uL 3.38  3.25  3.59   Hemoglobin 12.0 - 15.0 g/dL 7.7  7.2  8.1   HCT 84.6 - 46.0 % 23.4  22.7  25.1   Platelets 150 - 400 K/uL 363  331  437   NEUT# 1.7 - 7.7 K/uL  9.3    Lymph# 0.7 - 4.0 K/uL  3.2       There is no height or weight on file to calculate BMI.  Orders:  No orders of the defined types were placed in this encounter.  No orders of the defined types were placed in this encounter.    Procedures: No procedures performed  Clinical Data: No additional findings.  ROS:  All other systems negative, except as noted in the HPI. Review of Systems  Objective: Vital Signs: LMP 10/04/2021   Specialty Comments:  No specialty comments available.  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  of skin (HCC)    Chest pain 11/02/2021   Elevated troponin 11/02/2021   SIRS (systemic inflammatory response syndrome) (HCC) 11/02/2021   Diabetes mellitus type 1 (HCC) 11/02/2021   Thrombocytosis 11/02/2021   Microcytic hypochromic anemia 11/02/2021   Hypokalemia 11/02/2021   Morbid obesity (HCC) 11/02/2021   Medication monitoring encounter 06/15/2021   Smoking 02/01/2021   Acute hematogenous osteomyelitis of right foot (HCC) 12/22/2020   Osteomyelitis of foot, right, acute (HCC)    Dehiscence of amputation stump (HCC)    Diabetic foot infection (HCC) 12/02/2020   Essential hypertension 12/02/2020   Hyperlipidemia 08/21/2018   Bipolar affective disorder, currently depressed, moderate (HCC) 01/23/2017   Insulin-treated type 2 diabetes mellitus (HCC) 03/07/2013   Asthma 03/07/2013   ADHD (attention deficit hyperactivity disorder) 03/07/2013   Migraine 03/07/2013   Tobacco abuse 03/07/2013   Past Medical History:  Diagnosis Date   Anemia    Asthma    as a child   Depression    Diabetes mellitus without complication (HCC)    Type 1 - Has Insulin Pump   Headache    migraines   High cholesterol    Hypertension     History reviewed. No pertinent family history.  Past Surgical History:  Procedure Laterality Date   AMPUTATION Right 12/22/2020   Procedure: RIGHT TRANSMETATARSAL AMPUTATION;  Surgeon: [name], MD;  Location: [facility] OR;  Service: Orthopedics;  Laterality: Right;   AMPUTATION Right 06/29/2021   Procedure: REVISION RIGHT TRANSMETATARSAL AMPUTATION;  Surgeon: [name], MD;  Location: [facility] OR;  Service: Orthopedics;  Laterality: Right;   AMPUTATION Right 11/11/2021   Procedure: RIGHT BELOW KNEE AMPUTATION;  Surgeon: [name], MD;  Location: [facility] OR;  Service: Orthopedics;  Laterality: Right;   AMPUTATION TOE Right 12/04/2020   Procedure: AMPUTATION RIGHT SECOND TOE, I&D FOOT;  Surgeon: Eldred Manges, MD;  Location: [facility] ORS;  Service: Orthopedics;  Laterality:  Right;  Right second toe amputation, I&D right foot.   APPLICATION OF WOUND VAC  11/11/2021   Procedure: APPLICATION OF WOUND VAC;  Surgeon: [name], MD;  Location: [facility] OR;  Service: Orthopedics;;   birth control implant  07/31/2006   Essure Implant   TONSILLECTOMY     TYMPANOSTOMY TUBE PLACEMENT     WISDOM TOOTH EXTRACTION     Social History   Occupational History   Not on file  Tobacco Use   Smoking status: Every Day    Packs/day: 0.25    Years: 25.00    Total pack years: 6.25    Types: Cigarettes   Smokeless tobacco: Never   Tobacco  comments:    Cut down on smoking while on Chantix-  Vaping Use   Vaping Use: Never used  Substance and Sexual Activity   Alcohol use: Not Currently    Comment: Once a year on New Year's Day   Drug use: Not Currently   Sexual activity: Yes    Birth control/protection: Implant    Comment: Essure Implant

## 2021-12-09 ENCOUNTER — Encounter: Payer: Managed Care, Other (non HMO) | Admitting: Family

## 2021-12-09 ENCOUNTER — Ambulatory Visit: Payer: Managed Care, Other (non HMO)

## 2021-12-09 DIAGNOSIS — S88111A Complete traumatic amputation at level between knee and ankle, right lower leg, initial encounter: Secondary | ICD-10-CM

## 2021-12-28 ENCOUNTER — Telehealth: Payer: Self-pay

## 2021-12-28 MED ORDER — AMLODIPINE BESYLATE 5 MG PO TABS: 5.0000 mg | ORAL_TABLET | Freq: Every day | ORAL | 0 refills | Status: DC

## 2021-12-28 MED ORDER — AMITRIPTYLINE HCL 25 MG PO TABS: 25.0000 mg | ORAL_TABLET | Freq: Every day | ORAL | 0 refills | Status: DC

## 2021-12-28 MED ORDER — PANTOPRAZOLE SODIUM 40 MG PO TBEC: 40.0000 mg | DELAYED_RELEASE_TABLET | Freq: Every day | ORAL | 0 refills | Status: DC

## 2021-12-28 NOTE — Telephone Encounter (Signed)
Medication refills sent to pharmacy. 1 month courtesy refill

## 2022-01-09 ENCOUNTER — Encounter: Payer: Self-pay | Admitting: Orthopedic Surgery

## 2022-01-09 ENCOUNTER — Other Ambulatory Visit: Payer: Self-pay

## 2022-01-09 ENCOUNTER — Ambulatory Visit (INDEPENDENT_AMBULATORY_CARE_PROVIDER_SITE_OTHER): Payer: Medicaid Other | Admitting: Orthopedic Surgery

## 2022-01-09 DIAGNOSIS — S88111A Complete traumatic amputation at level between knee and ankle, right lower leg, initial encounter: Secondary | ICD-10-CM

## 2022-01-09 DIAGNOSIS — Z89511 Acquired absence of right leg below knee: Secondary | ICD-10-CM

## 2022-01-09 NOTE — Progress Notes (Signed)
Office Visit Note   Patient: Valerie Le           Date of Birth: 1971/01/14           MRN: [id number] Visit Date: 01/09/2022              Requested by: Maud Deed, [place] [address] [place],  [place] [address] PCP: Maud Deed, [place]  Chief Complaint  Patient presents with   Right Leg - Routine Post Op    11/11/2021 right BKA with Kerecis       HPI: Patient is a 51 year old woman who is 2 months status post right below-knee amputation on 11/11/2021.  Patient is following up with [employer] for prosthetic fitting casting last Friday.  Assessment & Plan: Visit Diagnoses:  1. Below-knee amputation of right lower extremity (HCC)     Plan: Orders are written for physical therapy upstairs.  She is given a note to continue out of work for 2 months.  2 view radiographs of the right leg at follow-up.  Follow-Up Instructions: Return in about 2 months (around 03/12/2022).   Ortho Exam  Patient is alert, oriented, no adenopathy, well-dressed, normal affect, normal respiratory effort. Examination the residual limb is well consolidated there were no open ulcers no redness no cellulitis.  Imaging: No results found. No images are attached to the encounter.  Labs: Lab Results  Component Value Date   HGBA1C 7.4 (H) 11/11/2021   HGBA1C 13.1 (H) 12/02/2020   ESRSEDRATE 84 (H) 11/02/2021   ESRSEDRATE 72 (H) 06/15/2021   CRP 24.7 (H) 06/15/2021   REPTSTATUS 11/07/2021 FINAL 11/02/2021   GRAMSTAIN  06/29/2021    RARE WBC PRESENT, PREDOMINANTLY MONONUCLEAR NO ORGANISMS SEEN    CULT  11/02/2021    NO GROWTH 5 DAYS Performed at [facility], [address] [place], [place] [address]    [facility] STAPHYLOCOCCUS EPIDERMIDIS 06/29/2021   LABORGA STAPHYLOCOCCUS LUGDUNENSIS 06/29/2021     Lab Results  Component Value Date   ALBUMIN 1.9 (L) 11/17/2021   ALBUMIN 2.1 (L) 11/03/2021   ALBUMIN 2.6 (L) 12/03/2020   PREALBUMIN 11.4 (L) 11/11/2021     Lab Results  Component Value Date   MG 1.9 11/11/2021   Lab Results  Component Value Date   VD25OH 33.19 11/17/2021   VD25OH 32.37 11/11/2021    Lab Results  Component Value Date   PREALBUMIN 11.4 (L) 11/11/2021      Latest Ref Rng & Units 11/21/2021    6:07 AM 11/17/2021    5:20 AM 11/13/2021    3:13 AM  CBC EXTENDED  WBC 4.0 - 10.5 K/uL 12.8  14.7  17.7   RBC 3.87 - 5.11 MIL/uL 3.38  3.25  3.59   Hemoglobin 12.0 - 15.0 g/dL 7.7  7.2  8.1   HCT 62.1 - 46.0 % 23.4  22.7  25.1   Platelets 150 - 400 K/uL 363  331  437   NEUT# 1.7 - 7.7 K/uL  9.3    Lymph# 0.7 - 4.0 K/uL  3.2       There is no height or weight on file to calculate BMI.  Orders:  No orders of the defined types were placed in this encounter.  No orders of the defined types were placed in this encounter.    Procedures: No procedures performed  Clinical Data: No additional findings.  ROS:  All other systems negative, except as noted in the HPI. Review of Systems  Objective:  Vital Signs: There were no vitals taken for this visit.  Specialty Comments:  No specialty comments available.  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 of skin (HCC)    Chest pain 11/02/2021   Elevated troponin 11/02/2021   SIRS (systemic inflammatory response syndrome) (HCC) 11/02/2021   Diabetes mellitus type 1 (HCC) 11/02/2021   Thrombocytosis 11/02/2021   Microcytic hypochromic anemia 11/02/2021   Hypokalemia 11/02/2021   Morbid obesity (HCC) 11/02/2021   Medication monitoring encounter 06/15/2021   Smoking 02/01/2021   Acute hematogenous osteomyelitis of right foot (HCC) 12/22/2020   Osteomyelitis of foot, right, acute (HCC)    Dehiscence of amputation  stump (HCC)    Diabetic foot infection (HCC) 12/02/2020   Essential hypertension 12/02/2020   Hyperlipidemia 08/21/2018   Bipolar affective disorder, currently depressed, moderate (HCC) 01/23/2017   Insulin-treated type 2 diabetes mellitus (HCC) 03/07/2013   Asthma 03/07/2013   ADHD (attention deficit hyperactivity disorder) 03/07/2013   Migraine 03/07/2013   Tobacco abuse 03/07/2013   Past Medical History:  Diagnosis Date   Anemia    Asthma    as a child   Depression    Diabetes mellitus without complication (HCC)    Type 1 - Has Insulin Pump   Headache    migraines   High cholesterol    Hypertension     History reviewed. No pertinent family history.  Past Surgical History:  Procedure Laterality Date   AMPUTATION Right 12/22/2020   Procedure: RIGHT TRANSMETATARSAL AMPUTATION;  Surgeon: [name], MD;  Location: [facility] OR;  Service: Orthopedics;  Laterality: Right;   AMPUTATION Right 06/29/2021   Procedure: REVISION RIGHT TRANSMETATARSAL AMPUTATION;  Surgeon: [name], MD;  Location: [facility] OR;  Service: Orthopedics;  Laterality: Right;   AMPUTATION Right 11/11/2021   Procedure: RIGHT BELOW KNEE AMPUTATION;  Surgeon: [name], MD;  Location: [facility] OR;  Service: Orthopedics;  Laterality: Right;   AMPUTATION TOE Right 12/04/2020   Procedure: AMPUTATION RIGHT SECOND TOE, I&D FOOT;  Surgeon: Eldred Manges, MD;  Location: [facility] ORS;  Service: Orthopedics;  Laterality: Right;  Right second toe amputation, I&D right foot.   APPLICATION OF WOUND VAC  11/11/2021   Procedure: APPLICATION OF WOUND VAC;  Surgeon: [name], MD;  Location: [facility] OR;  Service: Orthopedics;;   birth control implant  07/31/2006   Essure Implant   TONSILLECTOMY     TYMPANOSTOMY TUBE PLACEMENT     WISDOM TOOTH EXTRACTION     Social History   Occupational History   Not on file  Tobacco Use   Smoking status: Every Day    Packs/day: 0.25    Years: 25.00    Total pack years: 6.25    Types: Cigarettes    Smokeless tobacco: Never   Tobacco comments:    Cut down on smoking while on Chantix-  Vaping Use   Vaping Use: Never used  Substance and Sexual Activity   Alcohol use: Not Currently    Comment: Once a year on New Year's Day   Drug use: Not Currently   Sexual activity: Yes    Birth control/protection: Implant    Comment: Essure Implant

## 2022-01-23 ENCOUNTER — Telehealth: Payer: Self-pay

## 2022-01-23 MED ORDER — AMITRIPTYLINE HCL 25 MG PO TABS: 25.0000 mg | ORAL_TABLET | Freq: Every day | ORAL | 0 refills | Status: DC

## 2022-01-23 MED ORDER — PANTOPRAZOLE SODIUM 40 MG PO TBEC
40.0000 mg | DELAYED_RELEASE_TABLET | Freq: Every day | ORAL | 0 refills | Status: AC
Start: 2022-01-23 — End: ?

## 2022-01-23 MED ORDER — AMLODIPINE BESYLATE 5 MG PO TABS: 5.0000 mg | ORAL_TABLET | Freq: Every day | ORAL | 0 refills | Status: DC

## 2022-01-23 NOTE — Telephone Encounter (Signed)
Refill sent.

## 2022-01-30 ENCOUNTER — Other Ambulatory Visit: Payer: Self-pay

## 2022-01-30 ENCOUNTER — Encounter: Payer: Self-pay | Admitting: Physical Therapy

## 2022-01-30 ENCOUNTER — Ambulatory Visit (INDEPENDENT_AMBULATORY_CARE_PROVIDER_SITE_OTHER): Payer: BC Managed Care – PPO | Admitting: Physical Therapy

## 2022-01-30 DIAGNOSIS — M6281 Muscle weakness (generalized): Secondary | ICD-10-CM

## 2022-01-30 DIAGNOSIS — R2681 Unsteadiness on feet: Secondary | ICD-10-CM

## 2022-01-30 DIAGNOSIS — R2689 Other abnormalities of gait and mobility: Secondary | ICD-10-CM | POA: Diagnosis not present

## 2022-01-30 DIAGNOSIS — R293 Abnormal posture: Secondary | ICD-10-CM

## 2022-01-30 DIAGNOSIS — L98498 Non-pressure chronic ulcer of skin of other sites with other specified severity: Secondary | ICD-10-CM | POA: Diagnosis not present

## 2022-01-30 NOTE — Therapy (Signed)
OUTPATIENT PHYSICAL THERAPY PROSTHETICS EVALUATION   Patient Name: Valerie Le MRN: [id number] DOB:[birth year], 51 y.o., female Today's Date: 01/30/2022  PCP: Maud Deed, PA REFERRING PROVIDER: [name], MD  End of Session  PT End of Session - 01/30/22 1151     Visit Number 1    Number of Visits 25    Date for PT Re-Evaluation 04/28/22    Authorization Type BCBS    Authorization Time Period $50 co-pay    Progress Note Due on Visit 10    PT Start Time 1107    PT Stop Time 1146    PT Time Calculation (min) 39 min    Equipment Utilized During Treatment Gait belt    Activity Tolerance Patient tolerated treatment well    Behavior During Therapy WFL for tasks assessed/performed             Past Medical History:  Diagnosis Date   Anemia    Asthma    as a child   Depression    Diabetes mellitus without complication (HCC)    Type 1 - Has Insulin Pump   Headache    migraines   High cholesterol    Hypertension    Past Surgical History:  Procedure Laterality Date   AMPUTATION Right 12/22/2020   Procedure: RIGHT TRANSMETATARSAL AMPUTATION;  Surgeon: [name], MD;  Location: [facility] OR;  Service: Orthopedics;  Laterality: Right;   AMPUTATION Right 06/29/2021   Procedure: REVISION RIGHT TRANSMETATARSAL AMPUTATION;  Surgeon: [name], MD;  Location: [facility] OR;  Service: Orthopedics;  Laterality: Right;   AMPUTATION Right 11/11/2021   Procedure: RIGHT BELOW KNEE AMPUTATION;  Surgeon: [name], MD;  Location: [facility] OR;  Service: Orthopedics;  Laterality: Right;   AMPUTATION TOE Right 12/04/2020   Procedure: AMPUTATION RIGHT SECOND TOE, I&D FOOT;  Surgeon: Eldred Manges, MD;  Location: [facility] OR[facility];  Service: Orthopedics;  Laterality: Right;  Right second toe amputation, I&D right foot.   APPLICATION OF WOUND VAC  11/11/2021   Procedure: APPLICATION OF WOUND VAC;  Surgeon: [name], MD;  Location: [facility] OR;  Service: Orthopedics;;   birth control implant   07/31/2006   Essure Implant   TONSILLECTOMY     TYMPANOSTOMY TUBE PLACEMENT     WISDOM TOOTH EXTRACTION     Patient Active Problem List   Diagnosis Date Noted   S/P BKA (below knee amputation), right (HCC) 11/16/2021   Below-knee amputation of right lower extremity (HCC) 11/11/2021   Hypoalbuminemia 11/03/2021   Non-pressure chronic ulcer of other part of right foot limited to breakdown of skin (HCC)    Chest pain 11/02/2021   Elevated troponin 11/02/2021   SIRS (systemic inflammatory response syndrome) (HCC) 11/02/2021   Diabetes mellitus type 1 (HCC) 11/02/2021   Thrombocytosis 11/02/2021   Microcytic hypochromic anemia 11/02/2021   Hypokalemia 11/02/2021   Morbid obesity (HCC) 11/02/2021   Medication monitoring encounter 06/15/2021   Smoking 02/01/2021   Acute hematogenous osteomyelitis of right foot (HCC) 12/22/2020   Osteomyelitis of foot, right, acute (HCC)    Dehiscence of amputation stump (HCC)    Diabetic foot infection (HCC) 12/02/2020   Essential hypertension 12/02/2020   Hyperlipidemia 08/21/2018   Bipolar affective disorder, currently depressed, moderate (HCC) 01/23/2017   Insulin-treated type 2 diabetes mellitus (HCC) 03/07/2013   Asthma 03/07/2013   ADHD (attention deficit hyperactivity disorder) 03/07/2013   Migraine 03/07/2013   Tobacco abuse 03/07/2013    ONSET DATE: 01/27/2022 prosthesis delivery  REFERRING DIAG: B04.888B (ICD-10-CM) - Below-knee amputation of right lower extremity (HCC)  THERAPY DIAG:  Other abnormalities of gait and mobility  Unsteadiness on feet  Muscle weakness (generalized)  Non-pressure chronic ulcer of skin of other sites with other specified severity (HCC)  Abnormal posture  Rationale for Evaluation and Treatment Rehabilitation  SUBJECTIVE:   SUBJECTIVE STATEMENT: She got the prosthesis 01/27/22, wearing it daily for 2 hours daily. She reports a clicking noise and wet flip flop noise.  Pt accompanied by:  husband and  10 y.o. son  PERTINENT HISTORY: Rt TTA 11/11/21 due to osteomyelitis of Transmetatarsal Amputation, HTN, DM type 1, obesity, asthma as child, depression, HA migraines  PAIN:  Are you having pain? No  PRECAUTIONS: None  WEIGHT BEARING RESTRICTIONS No  FALLS: Has patient fallen in last 6 months? Yes. Number of falls 3 - once in hospital after surgery, once 2 weeks after leaving hospital with scooter wheel getting stuck, once on Monday transferring to kneel scooter in bathroom and pt reports no injuries sustained  LIVING ENVIRONMENT: Lives with: lives with their spouse and 2 sons, daughter, 26 yo grandson, 65lb dog, and kitten Lives in: House/apartment Home Access: Stairs to enter and  threshold to apartment Home layout: One level Stairs: Yes: External: 3 steps; can reach both Has following equipment at home: Quad cane small base, Environmental consultant - 2 wheeled, Wheelchair (manual), shower chair, bed side commode, and kneel walker/rollator  PLOF: Independent, not limited in community walking, worked as SW in community setting  PATIENT GOALS  strengthening, maneuvering to standing, driving, 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, entering multiple types environments)  OBJECTIVE:   COGNITION: Overall cognitive status: Within functional limits for tasks assessed   SENSATION: Not tested  POSTURE: rounded shoulders, forward head, and weight shift left  LOWER EXTREMITY ROM:  ROM Right eval Left eval  Hip flexion    Hip extension    Hip abduction    Hip adduction    Hip internal rotation    Hip external rotation    Knee flexion    Knee extension    Ankle dorsiflexion    Ankle plantarflexion    Ankle inversion    Ankle eversion     (Blank rows = not tested)  LOWER EXTREMITY MMT:  MMT Right eval Left eval  Hip flexion    Hip extension    Hip abduction    Hip adduction    Hip internal rotation    Hip external rotation    Knee flexion    Knee extension     Ankle dorsiflexion    Ankle plantarflexion    Ankle inversion    Ankle eversion    (Blank rows = not tested)  TRANSFERS: 01/30/22 Sit to stand: SBA with verbal cueing for scooting forward in chair, used BUE to stand but could stabilize without UE support on RW Stand to sit: SBA, required BUE support to control descent  GAIT: Gait pattern: step to pattern, decreased step length- Left, decreased stance time- Right, decreased stride length, decreased hip/knee flexion- Right, Right hip hike, knee flexed in stance- Right, antalgic, narrow BOS, and abducted- Right Distance walked: 30' Assistive device utilized: Environmental consultant - 2 wheeled Level of assistance: SBA Gait velocity: 1.10 ft/sec Comments: excessive weight bearing BUEs on RW and partial weight on prosthesis  FUNCTIONAL TESTs:  Berg Balance Scale: 01/30/22 score 25/56  [facility] PT Assessment - 01/30/22 1110       Standardized Balance Assessment  Standardized Balance Assessment Berg Balance Test      Berg Balance Test   Sit to Stand Able to stand using hands after several tries    Standing Unsupported Needs several tries to stand 30 seconds unsupported    Sitting with Back Unsupported but Feet Supported on Floor or Stool Able to sit safely and securely 2 minutes    Stand to Sit Controls descent by using hands    Transfers Able to transfer safely, definite need of hands    Standing Unsupported with Eyes Closed Unable to keep eyes closed 3 seconds but stays steady    Standing Unsupported with Feet Together Needs help to attain position but able to stand for 30 seconds with feet together    From Standing, Reach Forward with Outstretched Arm Can reach confidently >25 cm (10")    From Standing Position, Pick up Object from Floor Able to pick up shoe, needs supervision    From Standing Position, Turn to Look Behind Over each Shoulder Looks behind one side only/other side shows less weight shift    Turn 360 Degrees Needs assistance while  turning    Standing Unsupported, Alternately Place Feet on Step/Stool Needs assistance to keep from falling or unable to try    Standing Unsupported, One Foot in [employer] balance while stepping or standing    Standing on One Leg Unable to try or needs assist to prevent fall    Total Score 25    Berg comment: BERG  < 36 high risk for falls (close to 100%) 46-51 moderate (>50%)   37-45 significant (>80%) 52-55 lower (> 25%)            CURRENT PROSTHETIC WEAR ASSESSMENT: Patient is dependent with: skin check, residual limb care, care of non-amputated limb, prosthetic cleaning, ply sock cleaning, correct ply sock adjustment, proper wear schedule/adjustment, and proper weight-bearing schedule/adjustment Donning prosthesis: Mod A and requires instructions for fit, donning Doffing prosthesis: SBA Prosthetic wear tolerance: 2 hours/day for 2 days since delivery (Saturday, Sunday) Prosthetic weight bearing tolerance: up to ~8 minutes with intermittent UE support with no complaints of residual limb pain or issues Edema: present Residual limb condition: one scab 8mm on lateral incision, one open wound superficial 63mm distal tibia, limb is cylindrical with normal temperature and color Prosthetic description: silicone liner with pin lock suspension, total contact socket, dynamic response foot   TODAY'S TREATMENT:  01/30/22 PATIENT EDUCATED ON FOLLOWING PROSTHETIC CARE: Prosthetic wear tolerance: 2 hours 2x/day, 7 days/week Other education  Skin check, Residual limb care, Prosthetic cleaning, Ply sock cleaning, Correct ply sock adjustment, Propper donning, Proper wear schedule/adjustment, Proper weight-bearing schedule/adjustment, and Comments sitting positioning of prosthesis Person educated: Patient Education method: Explanation, Demonstration, Tactile cues, and Verbal cues Education comprehension: verbalized understanding, returned demonstration, verbal cues required, and needs further  education   HOME EXERCISE PROGRAM:   ASSESSMENT:  CLINICAL IMPRESSION: Patient is a 51 y.o. female who was seen today for physical therapy evaluation and treatment for prosthetic training s/p Rt BKA 11/11/21 and prosthesis received 01/27/22. She is dependent in prosthetic care and has two wounds along incision which limits safe use of prosthesis. She currently wears the prosthesis 2 hours daily which limits function during awake hours. The Berg balance score of 25/56 indicates high fall risk. She presents with gait deviations and need for cueing for prosthesis management during transfers and ambulation with RW for short distances only. She will benefit from skilled PT to address prosthesis training and functional  limitations.  OBJECTIVE IMPAIRMENTS Abnormal gait, decreased activity tolerance, decreased balance, decreased endurance, decreased knowledge of use of DME, decreased mobility, difficulty walking, decreased strength, postural dysfunction, and prosthetic dependency .   ACTIVITY LIMITATIONS carrying, lifting, bending, sitting, standing, squatting, stairs, transfers, locomotion level, and caring for others  PARTICIPATION LIMITATIONS: meal prep, cleaning, laundry, driving, community activity, and occupation  PERSONAL FACTORS Fitness, Time since onset of injury/illness/exacerbation, and 3+ comorbidities: see PMH  are also affecting patient's functional outcome.   REHAB POTENTIAL: Good  CLINICAL DECISION MAKING: Evolving/moderate complexity  EVALUATION COMPLEXITY: Moderate   GOALS: Goals reviewed with patient? Yes  SHORT TERM GOALS: Target date: 03/03/2022  Patient donnes prosthesis modified independent & verbalizes adjustment ply socks. Baseline: SEE OBJECTIVE DATA Goal status: INITIAL  2.  Patient tolerates prosthesis >10 hrs total /day without increase in skin issues or no limb pain after standing. Baseline: SEE OBJECTIVE DATA Goal status: INITIAL   3.  Berg Balance  >36/56 Baseline: SEE OBJECTIVE DATA Goal status: INITIAL   4. Patient ambulates 200' with cane & prosthesis with supervision. Baseline: SEE OBJECTIVE DATA Goal status: INITIAL   5. Patient negotiates ramps & curbs with RW & prosthesis with modified independent and with cane with minA Baseline: SEE OBJECTIVE DATA Goal status: INITIAL   LONG TERM GOALS: Target date: 04/28/2022  Patient demonstrates & verbalizes understanding of prosthetic care to enable safe utilization of prosthesis. Baseline: SEE OBJECTIVE DATA Goal status: INITIAL   Patient tolerates prosthesis wear >90% of awake hours without skin or limb pain issues. Baseline: SEE OBJECTIVE DATA Goal status: INITIAL   Berg Balance task >/= 46/56 to indicate lower fall risk Baseline: SEE OBJECTIVE DATA Goal status: INITIAL   Patient safely ambulates >500' with prosthesis & cane or less independently. Baseline: SEE OBJECTIVE DATA Goal status: INITIAL   Patient negotiates ramps, curbs & stairs with single rail with prosthesis & cane or less independently. Baseline: SEE OBJECTIVE DATA Goal status: INITIAL  6.   Patient verbalizes & demonstrates work simulated tasks like lifting & carrying. Baseline: SEE OBJECTIVE DATA Goal status: INITIAL    PLAN: PT FREQUENCY: 1-2x/week  PT DURATION: 12 weeks  PLANNED INTERVENTIONS: Therapeutic exercises, Therapeutic activity, Neuromuscular re-education, Balance training, Gait training, Patient/Family education, Self Care, Stair training, Prosthetic training, DME instructions, scar mobilization, Ultrasound, Ionotophoresis 4mg /ml Dexamethasone, Manual therapy, Re-evaluation, and physical performance tests  PLAN FOR NEXT SESSION: Assess prosthesis wear tolerance and wound care. Instruct in adjusting ply socks.  Gait, ramp, & curb with RW, assess 3 steps to safely enter home   [facility], Student-PT 01/30/2022, 4:51 PM  This entire session of physical therapy was performed under  the direct supervision of PT signing evaluation /treatment. PT reviewed note and agrees.  02/01/2022, PT, DPT 01/30/2022 4:55 PM

## 2022-02-02 ENCOUNTER — Telehealth: Payer: Self-pay | Admitting: Orthopedic Surgery

## 2022-02-02 NOTE — Telephone Encounter (Signed)
Pt needs a new out of work note.

## 2022-02-03 MED ORDER — OXYCODONE HCL 5 MG PO TABS: 5.0000 mg | ORAL_TABLET | Freq: Four times a day (QID) | ORAL | 0 refills | Status: DC | PRN

## 2022-02-03 NOTE — Telephone Encounter (Signed)
I SW pt, she states she needs a note stating that she is still in PT and that she will cont to be out of work until 03/14/22 at her next OV and we can re-eval work status at that time.  Letter written for pt and up front for her to p/u.

## 2022-02-03 NOTE — Telephone Encounter (Signed)
Pt states she has not been taking anything for pain. She has had increased pain since starting PT. She is asking for something stronger be sent in to pharmacy other than OTC strength.

## 2022-02-03 NOTE — Addendum Note (Signed)
Addended by: Barnie Del R on: 02/03/2022 11:03 AM   Modules accepted: Orders

## 2022-02-14 ENCOUNTER — Ambulatory Visit (INDEPENDENT_AMBULATORY_CARE_PROVIDER_SITE_OTHER): Payer: BC Managed Care – PPO | Admitting: Physical Therapy

## 2022-02-14 ENCOUNTER — Encounter: Payer: Self-pay | Admitting: Physical Therapy

## 2022-02-14 ENCOUNTER — Encounter: Payer: BC Managed Care – PPO | Admitting: Physical Medicine and Rehabilitation

## 2022-02-14 DIAGNOSIS — R2689 Other abnormalities of gait and mobility: Secondary | ICD-10-CM

## 2022-02-14 DIAGNOSIS — R293 Abnormal posture: Secondary | ICD-10-CM

## 2022-02-14 DIAGNOSIS — M6281 Muscle weakness (generalized): Secondary | ICD-10-CM | POA: Diagnosis not present

## 2022-02-14 DIAGNOSIS — R2681 Unsteadiness on feet: Secondary | ICD-10-CM

## 2022-02-14 DIAGNOSIS — L98498 Non-pressure chronic ulcer of skin of other sites with other specified severity: Secondary | ICD-10-CM

## 2022-02-14 NOTE — Therapy (Signed)
OUTPATIENT PHYSICAL THERAPY TREATMENT NOTE   Patient Name: Valerie Le MRN: [id number] DOB:02-06-1971, 51 y.o., female Today's Date: 02/14/2022  PCP: Willeen Niece, PA REFERRING PROVIDER: Newt Minion, MD  END OF SESSION:   PT End of Session - 02/14/22 1246     Visit Number 2    Number of Visits 25    Date for PT Re-Evaluation 04/28/22    Authorization Type [employer]    Authorization Time Period $50 co-pay    Progress Note Due on Visit 10    PT Start Time 1155    PT Stop Time 1233    PT Time Calculation (min) 38 min    Equipment Utilized During Treatment Gait belt    Activity Tolerance Patient tolerated treatment well    Behavior During Therapy WFL for tasks assessed/performed             Past Medical History:  Diagnosis Date   Anemia    Asthma    as a child   Depression    Diabetes mellitus without complication ([place])    Type 1 - Has Insulin Pump   Headache    migraines   High cholesterol    Hypertension    Past Surgical History:  Procedure Laterality Date   AMPUTATION Right 12/22/2020   Procedure: RIGHT TRANSMETATARSAL AMPUTATION;  Surgeon: Newt Minion, MD;  Location: [facility];  Service: Orthopedics;  Laterality: Right;   AMPUTATION Right 06/29/2021   Procedure: REVISION RIGHT TRANSMETATARSAL AMPUTATION;  Surgeon: Newt Minion, MD;  Location: [facility];  Service: Orthopedics;  Laterality: Right;   AMPUTATION Right 11/11/2021   Procedure: RIGHT BELOW KNEE AMPUTATION;  Surgeon: Newt Minion, MD;  Location: [facility];  Service: Orthopedics;  Laterality: Right;   AMPUTATION TOE Right 12/04/2020   Procedure: AMPUTATION RIGHT SECOND TOE, I&D FOOT;  Surgeon: Marybelle Killings, MD;  Location: [facility];  Service: Orthopedics;  Laterality: Right;  Right second toe amputation, I&D right foot.   APPLICATION OF WOUND VAC  11/11/2021   Procedure: APPLICATION OF WOUND VAC;  Surgeon: Newt Minion, MD;  Location: [facility];  Service: Orthopedics;;   birth control implant   07/31/2006   Essure Implant   TONSILLECTOMY     TYMPANOSTOMY TUBE PLACEMENT     WISDOM TOOTH EXTRACTION     Patient Active Problem List   Diagnosis Date Noted   S/P BKA (below knee amputation), right ([place]) 11/16/2021   Below-knee amputation of right lower extremity ([place]) 11/11/2021   Hypoalbuminemia 11/03/2021   Non-pressure chronic ulcer of other part of right foot limited to breakdown of skin ([facility])    Chest pain 11/02/2021   Elevated troponin 11/02/2021   SIRS (systemic inflammatory response syndrome) ([place]) 11/02/2021   Diabetes mellitus type 1 ([place]) 11/02/2021   Thrombocytosis 11/02/2021   Microcytic hypochromic anemia 11/02/2021   Hypokalemia 11/02/2021   Morbid obesity ([place]) 11/02/2021   Medication monitoring encounter 06/15/2021   Smoking 02/01/2021   Acute hematogenous osteomyelitis of right foot ([place]) 12/22/2020   Osteomyelitis of foot, right, acute ([place])    Dehiscence of amputation stump ([place])    Diabetic foot infection ([place]) 12/02/2020   Essential hypertension 12/02/2020   Hyperlipidemia 08/21/2018   Bipolar affective disorder, currently depressed, moderate ([place]) 01/23/2017   Insulin-treated type 2 diabetes mellitus ([place]) 03/07/2013   Asthma 03/07/2013   ADHD (attention deficit hyperactivity disorder) 03/07/2013   Migraine 03/07/2013   Tobacco abuse 03/07/2013    REFERRING DIAG: [id number] (ICD-10-CM) - Below-knee  amputation of right lower extremity ([place])    ONSET DATE: 01/27/2022 prosthesis delivery  THERAPY DIAG:  Other abnormalities of gait and mobility  Unsteadiness on feet  Muscle weakness (generalized)  Non-pressure chronic ulcer of skin of other sites with other specified severity ([place])  Abnormal posture  Rationale for Evaluation and Treatment Rehabilitation  PERTINENT HISTORY:  Rt TTA 11/11/21 due to osteomyelitis of Transmetatarsal Amputation, HTN, DM type 1, obesity, asthma as child, depression, HA migraines  PRECAUTIONS: None  SUBJECTIVE: She  is only wearing her prosthesis when she leaves her house. So there has been a couple of days that she did not wear it at all because she did not go out.   PAIN:  Are you having pain? No   OBJECTIVE: (objective measures completed at initial evaluation unless otherwise dated)   OBJECTIVE:    COGNITION: Overall cognitive status: Within functional limits for tasks assessed              SENSATION: Not tested   POSTURE: rounded shoulders, forward head, and weight shift left   LOWER EXTREMITY ROM:   ROM Right eval Left eval  Hip flexion      Hip extension      Hip abduction      Hip adduction      Hip internal rotation      Hip external rotation      Knee flexion      Knee extension      Ankle dorsiflexion      Ankle plantarflexion      Ankle inversion      Ankle eversion       (Blank rows = not tested)   LOWER EXTREMITY MMT:   MMT Right eval Left eval  Hip flexion      Hip extension      Hip abduction      Hip adduction      Hip internal rotation      Hip external rotation      Knee flexion      Knee extension      Ankle dorsiflexion      Ankle plantarflexion      Ankle inversion      Ankle eversion      (Blank rows = not tested)   TRANSFERS: 01/30/22 Sit to stand: SBA with verbal cueing for scooting forward in chair, used BUE to stand but could stabilize without UE support on RW Stand to sit: SBA, required BUE support to control descent   GAIT: Gait pattern: step to pattern, decreased step length- Left, decreased stance time- Right, decreased stride length, decreased hip/knee flexion- Right, Right hip hike, knee flexed in stance- Right, antalgic, narrow BOS, and abducted- Right Distance walked: 30' Assistive device utilized: Environmental consultant - 2 wheeled Level of assistance: SBA Gait velocity: 1.10 ft/sec Comments: excessive weight bearing BUEs on RW and partial weight on prosthesis   FUNCTIONAL TESTs:  Berg Balance Scale: 01/30/22 score 25/56   [facility] PT  Assessment - 01/30/22 1110                Standardized Balance Assessment    Standardized Balance Assessment Berg Balance Test          Berg Balance Test    Sit to Stand Able to stand using hands after several tries     Standing Unsupported Needs several tries to stand 30 seconds unsupported     Sitting with Back Unsupported but Feet Supported on Floor or Stool Able  to sit safely and securely 2 minutes     Stand to Sit Controls descent by using hands     Transfers Able to transfer safely, definite need of hands     Standing Unsupported with Eyes Closed Unable to keep eyes closed 3 seconds but stays steady     Standing Unsupported with Feet Together Needs help to attain position but able to stand for 30 seconds with feet together     From Standing, Reach Forward with Outstretched Arm Can reach confidently >25 cm (10")     From Standing Position, Pick up Object from Floor Able to pick up shoe, needs supervision     From Standing Position, Turn to Look Behind Over each Shoulder Looks behind one side only/other side shows less weight shift     Turn 360 Degrees Needs assistance while turning     Standing Unsupported, Alternately Place Feet on Step/Stool Needs assistance to keep from falling or unable to try     Standing Unsupported, One Foot in [employer] balance while stepping or standing     Standing on One Leg Unable to try or needs assist to prevent fall     Total Score 25     Berg comment: BERG  < 36 high risk for falls (close to 100%) 46-51 moderate (>50%)   37-45 significant (>80%) 52-55 lower (> 25%)                 CURRENT PROSTHETIC WEAR ASSESSMENT: Patient is dependent with: skin check, residual limb care, care of non-amputated limb, prosthetic cleaning, ply sock cleaning, correct ply sock adjustment, proper wear schedule/adjustment, and proper weight-bearing schedule/adjustment Donning prosthesis: Mod A and requires instructions for fit, donning Doffing prosthesis:  SBA Prosthetic wear tolerance: 2 hours/day for 2 days since delivery (Saturday, Sunday) Prosthetic weight bearing tolerance: up to ~8 minutes with intermittent UE support with no complaints of residual limb pain or issues Edema: present Residual limb condition: one scab 34mm on lateral incision, one open wound superficial 62mm distal tibia, limb is cylindrical with normal temperature and color Prosthetic description: silicone liner with pin lock suspension, total contact socket, dynamic response foot     TODAY'S TREATMENT:  02/14/2022 Prosthetic Training with Transtibial Amputation prosthesis Large dry scab on lateral incision which PT removed with scissors as only held on with one small area.  PT recommended wear 4 hrs 2x/day daily weather staying in home or going out. Pt verbalized understanding. PT demo & instructed in adjusting ply socks with too few, too many and correct ply fit.  Pt verbalized better understanding.  PT demo & verbal cues on picking up item from floor. Pt return demo 3 reps safely. PT demo & verbal cues on technique to neg ramp & curb with cane. Pt performed with min guard.  PT instructed in stairs with single rail & cane.  Pt neg 3 steps with right rail & 3 steps with left rail.    01/30/22 PATIENT EDUCATED ON FOLLOWING PROSTHETIC CARE: Prosthetic wear tolerance: 2 hours 2x/day, 7 days/week Other education  Skin check, Residual limb care, Prosthetic cleaning, Ply sock cleaning, Correct ply sock adjustment, [place] donning, Proper wear schedule/adjustment, Proper weight-bearing schedule/adjustment, and Comments sitting positioning of prosthesis Person educated: Patient Education method: Explanation, Demonstration, Tactile cues, and Verbal cues Education comprehension: verbalized understanding, returned demonstration, verbal cues required, and needs further education     HOME EXERCISE PROGRAM:     ASSESSMENT:   CLINICAL IMPRESSION: Patient appears to have  better  understanding of wear & adjusting ply socks.  She improved her ability to neg ramps, curbs & stairs with single rail with PT instruction.    OBJECTIVE IMPAIRMENTS Abnormal gait, decreased activity tolerance, decreased balance, decreased endurance, decreased knowledge of use of DME, decreased mobility, difficulty walking, decreased strength, postural dysfunction, and prosthetic dependency .    ACTIVITY LIMITATIONS carrying, lifting, bending, sitting, standing, squatting, stairs, transfers, locomotion level, and caring for others   PARTICIPATION LIMITATIONS: meal prep, cleaning, laundry, driving, community activity, and occupation   PERSONAL FACTORS Fitness, Time since onset of injury/illness/exacerbation, and 3+ comorbidities: see PMH  are also affecting patient's functional outcome.    REHAB POTENTIAL: Good   CLINICAL DECISION MAKING: Evolving/moderate complexity   EVALUATION COMPLEXITY: Moderate     GOALS: Goals reviewed with patient? Yes   SHORT TERM GOALS: Target date: 03/03/2022   Patient donnes prosthesis modified independent & verbalizes adjustment ply socks. Baseline: SEE OBJECTIVE DATA Goal status: INITIAL   2.  Patient tolerates prosthesis >10 hrs total /day without increase in skin issues or no limb pain after standing. Baseline: SEE OBJECTIVE DATA Goal status: INITIAL   3.  Berg Balance >36/56 Baseline: SEE OBJECTIVE DATA Goal status: INITIAL   4. Patient ambulates 200' with cane & prosthesis with supervision. Baseline: SEE OBJECTIVE DATA Goal status: INITIAL   5. Patient negotiates ramps & curbs with RW & prosthesis with modified independent and with cane with minA Baseline: SEE OBJECTIVE DATA Goal status: INITIAL     LONG TERM GOALS: Target date: 04/28/2022   Patient demonstrates & verbalizes understanding of prosthetic care to enable safe utilization of prosthesis. Baseline: SEE OBJECTIVE DATA Goal status: INITIAL   Patient tolerates prosthesis wear >90%  of awake hours without skin or limb pain issues. Baseline: SEE OBJECTIVE DATA Goal status: INITIAL   Berg Balance task >/= 46/56 to indicate lower fall risk Baseline: SEE OBJECTIVE DATA Goal status: INITIAL   Patient safely ambulates >500' with prosthesis & cane or less independently. Baseline: SEE OBJECTIVE DATA Goal status: INITIAL   Patient negotiates ramps, curbs & stairs with single rail with prosthesis & cane or less independently. Baseline: SEE OBJECTIVE DATA Goal status: INITIAL   6.   Patient verbalizes & demonstrates work simulated tasks like lifting & carrying. Baseline: SEE OBJECTIVE DATA Goal status: INITIAL       PLAN: PT FREQUENCY: 1-2x/week   PT DURATION: 12 weeks   PLANNED INTERVENTIONS: Therapeutic exercises, Therapeutic activity, Neuromuscular re-education, Balance training, Gait training, Patient/Family education, Self Care, Stair training, Prosthetic training, DME instructions, scar mobilization, Ultrasound, Ionotophoresis 4mg /ml Dexamethasone, Manual therapy, Re-evaluation, and physical performance tests   PLAN FOR NEXT SESSION:   set up HEP for balance,  prosthetic gait with cane including ramps, curbs & stairs.     , PT, DPT 02/14/2022, 12:47 PM

## 2022-02-15 ENCOUNTER — Ambulatory Visit (INDEPENDENT_AMBULATORY_CARE_PROVIDER_SITE_OTHER): Payer: BC Managed Care – PPO | Admitting: Physical Therapy

## 2022-02-15 ENCOUNTER — Encounter: Payer: Self-pay | Admitting: Physical Therapy

## 2022-02-15 DIAGNOSIS — R2681 Unsteadiness on feet: Secondary | ICD-10-CM

## 2022-02-15 DIAGNOSIS — R293 Abnormal posture: Secondary | ICD-10-CM

## 2022-02-15 DIAGNOSIS — R2689 Other abnormalities of gait and mobility: Secondary | ICD-10-CM

## 2022-02-15 DIAGNOSIS — L98498 Non-pressure chronic ulcer of skin of other sites with other specified severity: Secondary | ICD-10-CM | POA: Diagnosis not present

## 2022-02-15 DIAGNOSIS — M6281 Muscle weakness (generalized): Secondary | ICD-10-CM

## 2022-02-15 NOTE — Patient Instructions (Signed)

## 2022-02-15 NOTE — Therapy (Signed)
OUTPATIENT PHYSICAL THERAPY TREATMENT NOTE   Patient Name: Valerie Le MRN: [id number] DOB:1971/02/20, 51 y.o., female Today's Date: 02/15/2022  PCP: [name], [name], PA REFERRING PROVIDER: Nadara Mustarduda, Marcus V, MD  END OF SESSION:   PT End of Session - 02/15/22 1150     Visit Number 3    Number of Visits 25    Date for PT Re-Evaluation 04/28/22    Authorization Type [employer]    Authorization Time Period $50 co-pay    Progress Note Due on Visit 10    PT Start Time 1146    PT Stop Time 1229    PT Time Calculation (min) 43 min    Equipment Utilized During Treatment Gait belt    Activity Tolerance Patient tolerated treatment well    Behavior During Therapy WFL for tasks assessed/performed             Past Medical History:  Diagnosis Date   Anemia    Asthma    as a child   Depression    Diabetes mellitus without complication (HCC)    Type 1 - Has Insulin Pump   Headache    migraines   High cholesterol    Hypertension    Past Surgical History:  Procedure Laterality Date   AMPUTATION Right 12/22/2020   Procedure: RIGHT TRANSMETATARSAL AMPUTATION;  Surgeon: Nadara Mustarduda, Marcus V, MD;  [facility];  Service: Orthopedics;  Laterality: Right;   AMPUTATION Right 06/29/2021   Procedure: REVISION RIGHT TRANSMETATARSAL AMPUTATION;  Surgeon: Nadara Mustarduda, Marcus V, MD;  [facility];  Service: Orthopedics;  Laterality: Right;   AMPUTATION Right 11/11/2021   Procedure: RIGHT BELOW KNEE AMPUTATION;  Surgeon: Nadara Mustarduda, Marcus V, MD;  [facility];  Service: Orthopedics;  Laterality: Right;   AMPUTATION TOE Right 12/04/2020   Procedure: AMPUTATION RIGHT SECOND TOE, I&D FOOT;  Surgeon: Eldred MangesYates, Mark C, MD;  Location: [facility] ORS;  Service: Orthopedics;  Laterality: Right;  Right second toe amputation, I&D right foot.   APPLICATION OF WOUND VAC  11/11/2021   Procedure: APPLICATION OF WOUND VAC;  Surgeon: Nadara Mustarduda, Marcus V, MD;  Location: [facility] OR;  Service: Orthopedics;;   birth control implant   07/31/2006   Essure Implant   TONSILLECTOMY     TYMPANOSTOMY TUBE PLACEMENT     WISDOM TOOTH EXTRACTION     Patient Active Problem List   Diagnosis Date Noted   S/P BKA (below knee amputation), right (HCC) 11/16/2021   Below-knee amputation of right lower extremity (HCC) 11/11/2021   Hypoalbuminemia 11/03/2021   Non-pressure chronic ulcer of other part of right foot limited to breakdown of skin (HCC)    Chest pain 11/02/2021   Elevated troponin 11/02/2021   SIRS (systemic inflammatory response syndrome) (HCC) 11/02/2021   Diabetes mellitus type 1 (HCC) 11/02/2021   Thrombocytosis 11/02/2021   Microcytic hypochromic anemia 11/02/2021   Hypokalemia 11/02/2021   Morbid obesity (HCC) 11/02/2021   Medication monitoring encounter 06/15/2021   Smoking 02/01/2021   Acute hematogenous osteomyelitis of right foot (HCC) 12/22/2020   Osteomyelitis of foot, right, acute (HCC)    Dehiscence of amputation stump (HCC)    Diabetic foot infection (HCC) 12/02/2020   Essential hypertension 12/02/2020   Hyperlipidemia 08/21/2018   Bipolar affective disorder, currently depressed, moderate (HCC) 01/23/2017   Insulin-treated type 2 diabetes mellitus (HCC) 03/07/2013   Asthma 03/07/2013   ADHD (attention deficit hyperactivity disorder) 03/07/2013   Migraine 03/07/2013   Tobacco abuse 03/07/2013    REFERRING DIAG: W09.811BS88.111A (ICD-10-CM) - Below-knee  amputation of right lower extremity (HCC)    ONSET DATE: 01/27/2022 prosthesis delivery  THERAPY DIAG:  Other abnormalities of gait and mobility  Unsteadiness on feet  Muscle weakness (generalized)  Non-pressure chronic ulcer of skin of other sites with other specified severity (HCC)  Abnormal posture  Rationale for Evaluation and Treatment Rehabilitation  PERTINENT HISTORY:  Rt TTA 11/11/21 due to osteomyelitis of Transmetatarsal Amputation, HTN, DM type 1, obesity, asthma as child, depression, HA migraines  PRECAUTIONS: None  SUBJECTIVE: She  has been doing pretty good since last session with changing ply socks independently. She wore the prosthesis 4 hours and 3 hours yesterday. She hasn't looked at the wound spot since scab removal yesterday. She isn't wearing the gray Vive wear under liner, thinking her cat might have stolen it, and is wearing a black liner that is longer under the silicone liner. She reports falling back into the couch from trying to stand up yesterday. She does not think picking things up from the floor at home has gone well.  PAIN:  Are you having pain? No   OBJECTIVE: (objective measures completed at initial evaluation unless otherwise dated)   OBJECTIVE:    COGNITION: Overall cognitive status: Within functional limits for tasks assessed              SENSATION: Not tested   POSTURE: rounded shoulders, forward head, and weight shift left   LOWER EXTREMITY ROM:   ROM Right eval Left eval  Hip flexion      Hip extension      Hip abduction      Hip adduction      Hip internal rotation      Hip external rotation      Knee flexion      Knee extension      Ankle dorsiflexion      Ankle plantarflexion      Ankle inversion      Ankle eversion       (Blank rows = not tested)   LOWER EXTREMITY MMT:   MMT Right eval Left eval  Hip flexion      Hip extension      Hip abduction      Hip adduction      Hip internal rotation      Hip external rotation      Knee flexion      Knee extension      Ankle dorsiflexion      Ankle plantarflexion      Ankle inversion      Ankle eversion      (Blank rows = not tested)   TRANSFERS: 01/30/22 Sit to stand: SBA with verbal cueing for scooting forward in chair, used BUE to stand but could stabilize without UE support on RW Stand to sit: SBA, required BUE support to control descent   GAIT: Gait pattern: step to pattern, decreased step length- Left, decreased stance time- Right, decreased stride length, decreased hip/knee flexion- Right, Right hip  hike, knee flexed in stance- Right, antalgic, narrow BOS, and abducted- Right Distance walked: 30' Assistive device utilized: Environmental consultant - 2 wheeled Level of assistance: SBA Gait velocity: 1.10 ft/sec Comments: excessive weight bearing BUEs on RW and partial weight on prosthesis   FUNCTIONAL TESTs:  Berg Balance Scale: 01/30/22 score 25/56   [facility] PT Assessment - 01/30/22 1110                Standardized Balance Assessment    Standardized Balance Assessment Sharlene Motts  Balance Test          Berg Balance Test    Sit to Stand Able to stand using hands after several tries     Standing Unsupported Needs several tries to stand 30 seconds unsupported     Sitting with Back Unsupported but Feet Supported on Floor or Stool Able to sit safely and securely 2 minutes     Stand to Sit Controls descent by using hands     Transfers Able to transfer safely, definite need of hands     Standing Unsupported with Eyes Closed Unable to keep eyes closed 3 seconds but stays steady     Standing Unsupported with Feet Together Needs help to attain position but able to stand for 30 seconds with feet together     From Standing, Reach Forward with Outstretched Arm Can reach confidently >25 cm (10")     From Standing Position, Pick up Object from Floor Able to pick up shoe, needs supervision     From Standing Position, Turn to Look Behind Over each Shoulder Looks behind one side only/other side shows less weight shift     Turn 360 Degrees Needs assistance while turning     Standing Unsupported, Alternately Place Feet on Step/Stool Needs assistance to keep from falling or unable to try     Standing Unsupported, One Foot in [employer] balance while stepping or standing     Standing on One Leg Unable to try or needs assist to prevent fall     Total Score 25     Berg comment: BERG  < 36 high risk for falls (close to 100%) 46-51 moderate (>50%)   37-45 significant (>80%) 52-55 lower (> 25%)                 CURRENT  PROSTHETIC WEAR ASSESSMENT: Patient is dependent with: skin check, residual limb care, care of non-amputated limb, prosthetic cleaning, ply sock cleaning, correct ply sock adjustment, proper wear schedule/adjustment, and proper weight-bearing schedule/adjustment Donning prosthesis: Mod A and requires instructions for fit, donning Doffing prosthesis: SBA Prosthetic wear tolerance: 2 hours/day for 2 days since delivery (Saturday, Sunday) Prosthetic weight bearing tolerance: up to ~8 minutes with intermittent UE support with no complaints of residual limb pain or issues Edema: present Residual limb condition: one scab 17mm on lateral incision, one open wound superficial 6mm distal tibia, limb is cylindrical with normal temperature and color Prosthetic description: silicone liner with pin lock suspension, total contact socket, dynamic response foot     TODAY'S TREATMENT:  02/15/2022 Prosthetic Training with Transtibial Amputation prosthesis Pt arrived to session feeling need to add a sock ply and discussed indication for changing ply with PT PT advised using bar stool for seated rest and ADLs to improve postural control and engagement, pt verbalized understanding and sat 5 min on 24" bar stool and 5 min on 29" mat simulating bar stool sitting with pt reporting feeling safe Scab on lateral incision is 8 mm and hair growth observed on lateral limb  Neuro Re-ed: Pt instructed in balance HEP at sink as noted below with verbal, visual, tactile curing for weight bearing through prosthesis and form, cues on using pressure on socket for proprioception, goal to improve standing balance & standing endurance.    02/14/2022 Prosthetic Training with Transtibial Amputation prosthesis Large dry scab on lateral incision which PT removed with scissors as only held on with one small area.  PT recommended wear 4 hrs 2x/day daily weather staying in  home or going out. Pt verbalized understanding. PT demo & instructed  in adjusting ply socks with too few, too many and correct ply fit.  Pt verbalized better understanding.  PT demo & verbal cues on picking up item from floor. Pt return demo 3 reps safely. PT demo & verbal cues on technique to neg ramp & curb with cane. Pt performed with min guard.  PT instructed in stairs with single rail & cane.  Pt neg 3 steps with right rail & 3 steps with left rail.    01/30/22 PATIENT EDUCATED ON FOLLOWING PROSTHETIC CARE: Prosthetic wear tolerance: 2 hours 2x/day, 7 days/week Other education  Skin check, Residual limb care, Prosthetic cleaning, Ply sock cleaning, Correct ply sock adjustment, Propper donning, Proper wear schedule/adjustment, Proper weight-bearing schedule/adjustment, and Comments sitting positioning of prosthesis Person educated: Patient Education method: Explanation, Demonstration, Tactile cues, and Verbal cues Education comprehension: verbalized understanding, returned demonstration, verbal cues required, and needs further education     HOME EXERCISE PROGRAM:   02/15/22 1214  PT Education  Education Details HEP at [employer]) Educated Patient  Methods Explanation;Demonstration;Tactile cues;Verbal cues;Handout  Comprehension Verbalized understanding;Returned demonstration;Verbal cues required;Tactile cues required    Do each exercise 1-2  times per day Do each exercise 5-10 repetitions Hold each exercise for 2 seconds to feel your location  AT SINK FIND YOUR MIDLINE POSITION AND PLACE FEET EQUAL DISTANCE FROM THE MIDLINE.  Try to find this position when standing still for activities.   USE TAPE ON FLOOR TO MARK THE MIDLINE POSITION which is even with middle of sink.  You also should try to feel with your limb pressure in socket.  You are trying to feel with limb what you used to feel with the bottom of your foot.  Side to Side Shift: Moving your hips only (not shoulders): move weight onto your left leg, HOLD/FEEL pressure in socket.  Move  back to equal weight on each leg, HOLD/FEEL pressure in socket. Move weight onto your right leg, HOLD/FEEL pressure in socket. Move back to equal weight on each leg, HOLD/FEEL pressure in socket. Repeat.  Start with both hands on sink, progress to hand on prosthetic side only, then no hands.  Front to Back Shift: Moving your hips only (not shoulders): move your weight forward onto your toes, HOLD/FEEL pressure in socket. Move your weight back to equal Flat Foot on both legs, HOLD/FEEL  pressure in socket. Move your weight back onto your heels, HOLD/FEEL  pressure in socket. Move your weight back to equal on both legs, HOLD/FEEL  pressure in socket. Repeat.  Start with both hands on sink, progress to hand on prosthetic side only, then no hands.  Moving Cones / Cups: With equal weight on each leg: Hold on with one hand the first time, then progress to no hand supports. Move cups from one side of sink to the other. Place cups ~2" out of your reach, progress to 10" beyond reach.  Place one hand in middle of sink and reach with other hand. Do both arms.  Then hover one hand and move cups with other hand.  Overhead/Upward Reaching: alternated reaching up to top cabinets or ceiling if no cabinets present. Keep equal weight on each leg. Start with one hand support on counter while other hand reaches and progress to no hand support with reaching.  ace one hand in middle of sink and reach with other hand. Do both arms.  Then hover one hand and move cups  with other hand.  5.   Looking Over Shoulders: With equal weight on each leg: alternate turning to look over your shoulders with one hand support on counter as needed.  Start with head motions only to look in front of shoulder, then even with shoulder and progress to looking behind you. To look to side, move head /eyes, then shoulder on side looking pulls back, shift more weight to side looking and pull hip back. Place one hand in middle of sink and let go with other hand  so your shoulder can pull back. Switch hands to look other way.   Then hover one hand and look over shoulder. If looking right, use left hand at sink. If looking left, use right hand at sink. 6.  Stepping with leg that is not amputated:  Move items under cabinet out of your way. Shift your hips/pelvis so weight on prosthesis. Tighten muscles in hip on prosthetic side.  SLOWLY step other leg so front of foot is in cabinet. Then step back to floor.     ASSESSMENT:   CLINICAL IMPRESSION: She was instructed in a standing balance HEP at sink to improve proprioception and balance through prosthesis and received printed instructions following demo and returned repetitions. She appropriately adjusted ply socks following instruction yesterday and increased wear of prosthesis. She continues to benefit from skilled PT.   OBJECTIVE IMPAIRMENTS Abnormal gait, decreased activity tolerance, decreased balance, decreased endurance, decreased knowledge of use of DME, decreased mobility, difficulty walking, decreased strength, postural dysfunction, and prosthetic dependency .    ACTIVITY LIMITATIONS carrying, lifting, bending, sitting, standing, squatting, stairs, transfers, locomotion level, and caring for others   PARTICIPATION LIMITATIONS: meal prep, cleaning, laundry, driving, community activity, and occupation   PERSONAL FACTORS Fitness, Time since onset of injury/illness/exacerbation, and 3+ comorbidities: see PMH  are also affecting patient's functional outcome.    REHAB POTENTIAL: Good   CLINICAL DECISION MAKING: Evolving/moderate complexity   EVALUATION COMPLEXITY: Moderate     GOALS: Goals reviewed with patient? Yes   SHORT TERM GOALS: Target date: 03/03/2022   Patient donnes prosthesis modified independent & verbalizes adjustment ply socks. Baseline: SEE OBJECTIVE DATA Goal status: INITIAL   2.  Patient tolerates prosthesis >10 hrs total /day without increase in skin issues or no limb pain  after standing. Baseline: SEE OBJECTIVE DATA Goal status: INITIAL   3.  Berg Balance >36/56 Baseline: SEE OBJECTIVE DATA Goal status: INITIAL   4. Patient ambulates 200' with cane & prosthesis with supervision. Baseline: SEE OBJECTIVE DATA Goal status: INITIAL   5. Patient negotiates ramps & curbs with RW & prosthesis with modified independent and with cane with minA Baseline: SEE OBJECTIVE DATA Goal status: INITIAL     LONG TERM GOALS: Target date: 04/28/2022   Patient demonstrates & verbalizes understanding of prosthetic care to enable safe utilization of prosthesis. Baseline: SEE OBJECTIVE DATA Goal status: INITIAL   Patient tolerates prosthesis wear >90% of awake hours without skin or limb pain issues. Baseline: SEE OBJECTIVE DATA Goal status: INITIAL   Berg Balance task >/= 46/56 to indicate lower fall risk Baseline: SEE OBJECTIVE DATA Goal status: INITIAL   Patient safely ambulates >500' with prosthesis & cane or less independently. Baseline: SEE OBJECTIVE DATA Goal status: INITIAL   Patient negotiates ramps, curbs & stairs with single rail with prosthesis & cane or less independently. Baseline: SEE OBJECTIVE DATA Goal status: INITIAL   6.   Patient verbalizes & demonstrates work simulated tasks like lifting & carrying. Baseline:  SEE OBJECTIVE DATA Goal status: INITIAL       PLAN: PT FREQUENCY: 1-2x/week   PT DURATION: 12 weeks   PLANNED INTERVENTIONS: Therapeutic exercises, Therapeutic activity, Neuromuscular re-education, Balance training, Gait training, Patient/Family education, Self Care, Stair training, Prosthetic training, DME instructions, scar mobilization, Ultrasound, Ionotophoresis 4mg /ml Dexamethasone, Manual therapy, Re-evaluation, and physical performance tests   PLAN FOR NEXT SESSION:  assess HEP verbally, progress prosthetic gait with cane including ramps, curbs & stairs. Reinforce picking items form floor to increase confidence   , Student-PT 02/15/2022, 1:24 PM  This entire session of physical therapy was performed under the direct supervision of PT signing evaluation /treatment. PT reviewed note and agrees.  02/17/2022, PT, DPT 02/15/2022, 1:35 PM

## 2022-02-21 NOTE — Therapy (Signed)
OUTPATIENT PHYSICAL THERAPY TREATMENT NOTE   Patient Name: Valerie Le MRN: [id number] DOB:07/14/1970, 51 y.o., female Today's Date: 02/22/2022  PCP: Howley, [name], PA REFERRING PROVIDER: [name], Marcus V, MD  END OF SESSION:   PT End of Session - 02/22/22 0843     Visit Number 4    Number of Visits 25    Date for PT Re-Evaluation 04/28/22    Authorization Type [employer]    Authorization Time Period $50 co-pay    Progress Note Due on Visit 10    PT Start Time 0845    PT Stop Time 0927    PT Time Calculation (min) 42 min    Equipment Utilized During Treatment Gait belt    Activity Tolerance Patient tolerated treatment well    Behavior During Therapy WFL for tasks assessed/performed             Past Medical History:  Diagnosis Date   Anemia    Asthma    as a child   Depression    Diabetes mellitus without complication (HCC)    Type 1 - Has Insulin Pump   Headache    migraines   High cholesterol    Hypertension    Past Surgical History:  Procedure Laterality Date   AMPUTATION Right 12/22/2020   Procedure: RIGHT TRANSMETATARSAL AMPUTATION;  Surgeon: [name], Marcus V, MD;  Location: [facility];  Service: Orthopedics;  Laterality: Right;   AMPUTATION Right 06/29/2021   Procedure: REVISION RIGHT TRANSMETATARSAL AMPUTATION;  Surgeon: [name], Marcus V, MD;  Location: [facility];  Service: Orthopedics;  Laterality: Right;   AMPUTATION Right 11/11/2021   Procedure: RIGHT BELOW KNEE AMPUTATION;  Surgeon: [name], Marcus V, MD;  Location: [facility];  Service: Orthopedics;  Laterality: Right;   AMPUTATION TOE Right 12/04/2020   Procedure: AMPUTATION RIGHT SECOND TOE, I&D FOOT;  Surgeon: Yates, Mark C, MD;  Location: [facility];  Service: Orthopedics;  Laterality: Right;  Right second toe amputation, I&D right foot.   APPLICATION OF WOUND VAC  11/11/2021   Procedure: APPLICATION OF WOUND VAC;  Surgeon: [name], Marcus V, MD;  Location: [facility];  Service: Orthopedics;;   birth control implant   07/31/2006   Essure Implant   TONSILLECTOMY     TYMPANOSTOMY TUBE PLACEMENT     WISDOM TOOTH EXTRACTION     Patient Active Problem List   Diagnosis Date Noted   S/P BKA (below knee amputation), right (HCC) 11/16/2021   Below-knee amputation of right lower extremity (HCC) 11/11/2021   Hypoalbuminemia 11/03/2021   Non-pressure chronic ulcer of other part of right foot limited to breakdown of skin (HCC)    Chest pain 11/02/2021   Elevated troponin 11/02/2021   SIRS (systemic inflammatory response syndrome) (HCC) 11/02/2021   Diabetes mellitus type 1 (HCC) 11/02/2021   Thrombocytosis 11/02/2021   Microcytic hypochromic anemia 11/02/2021   Hypokalemia 11/02/2021   Morbid obesity (HCC) 11/02/2021   Medication monitoring encounter 06/15/2021   Smoking 02/01/2021   Acute hematogenous osteomyelitis of right foot (HCC) 12/22/2020   Osteomyelitis of foot, right, acute (HCC)    Dehiscence of amputation stump (HCC)    Diabetic foot infection (HCC) 12/02/2020   Essential hypertension 12/02/2020   Hyperlipidemia 08/21/2018   Bipolar affective disorder, currently depressed, moderate (HCC) 01/23/2017   Insulin-treated type 2 diabetes mellitus (HCC) 03/07/2013   Asthma 03/07/2013   ADHD (attention deficit hyperactivity disorder) 03/07/2013   Migraine 03/07/2013   Tobacco abuse 03/07/2013    REFERRING DIAG: S88.111A (ICD-10-CM) - Below-knee   amputation of right lower extremity ([place])    ONSET DATE: 01/27/2022 prosthesis delivery  THERAPY DIAG:  Other abnormalities of gait and mobility  Unsteadiness on feet  Muscle weakness (generalized)  Non-pressure chronic ulcer of skin of other sites with other specified severity ([place])  Abnormal posture  Rationale for Evaluation and Treatment Rehabilitation  PERTINENT HISTORY:  Rt TTA 11/11/21 due to osteomyelitis of Transmetatarsal Amputation, HTN, DM type 1, obesity, asthma as child, depression, HA migraines  PRECAUTIONS: None  SUBJECTIVE: She  has been doing well. She feels that she is sinking in the socket today with extra socks in the car. She has been practicing the standing balance exercises and they have been going well (with the exception of the single step as her home doesn't have a set up for that). She was sick Saturday and Sunday, Monday she wore the prosthesis from 8:30 to 4 with one removal in the car and Tuesday she wore it 9-2. She has increased fatigue with walking.  PAIN:  Are you having pain? No    OBJECTIVE: (objective measures completed at initial evaluation unless otherwise dated)   OBJECTIVE:    COGNITION: Overall cognitive status: Within functional limits for tasks assessed              SENSATION: Not tested   POSTURE: rounded shoulders, forward head, and weight shift left   LOWER EXTREMITY ROM:   ROM Right eval Left eval  Hip flexion      Hip extension      Hip abduction      Hip adduction      Hip internal rotation      Hip external rotation      Knee flexion      Knee extension      Ankle dorsiflexion      Ankle plantarflexion      Ankle inversion      Ankle eversion       (Blank rows = not tested)   LOWER EXTREMITY MMT:   MMT Right eval Left eval  Hip flexion      Hip extension      Hip abduction      Hip adduction      Hip internal rotation      Hip external rotation      Knee flexion      Knee extension      Ankle dorsiflexion      Ankle plantarflexion      Ankle inversion      Ankle eversion      (Blank rows = not tested)   TRANSFERS: 02/22/22 STS with SBA on chair without armrests, uses BUE and stabilizes without UE support  01/30/22 Sit to stand: SBA with verbal cueing for scooting forward in chair, used BUE to stand but could stabilize without UE support on RW Stand to sit: SBA, required BUE support to control descent   GAIT: Gait pattern: step to pattern, decreased step length- Left, decreased stance time- Right, decreased stride length, decreased hip/knee  flexion- Right, Right hip hike, knee flexed in stance- Right, antalgic, narrow BOS, and abducted- Right Distance walked: 30' Assistive device utilized: Environmental consultant - 2 wheeled Level of assistance: SBA Gait velocity: 1.10 ft/sec Comments: excessive weight bearing BUEs on RW and partial weight on prosthesis   FUNCTIONAL TESTs:  Berg Balance Scale: 01/30/22 score 25/56   [facility] PT Assessment - 01/30/22 1110  Standardized Balance Assessment    Standardized Balance Assessment Berg Balance Test          Berg Balance Test    Sit to Stand Able to stand using hands after several tries     Standing Unsupported Needs several tries to stand 30 seconds unsupported     Sitting with Back Unsupported but Feet Supported on Floor or Stool Able to sit safely and securely 2 minutes     Stand to Sit Controls descent by using hands     Transfers Able to transfer safely, definite need of hands     Standing Unsupported with Eyes Closed Unable to keep eyes closed 3 seconds but stays steady     Standing Unsupported with Feet Together Needs help to attain position but able to stand for 30 seconds with feet together     From Standing, Reach Forward with Outstretched Arm Can reach confidently >25 cm (10")     From Standing Position, Pick up Object from Floor Able to pick up shoe, needs supervision     From Standing Position, Turn to Look Behind Over each Shoulder Looks behind one side only/other side shows less weight shift     Turn 360 Degrees Needs assistance while turning     Standing Unsupported, Alternately Place Feet on Step/Stool Needs assistance to keep from falling or unable to try     Standing Unsupported, One Foot in [employer] balance while stepping or standing     Standing on One Leg Unable to try or needs assist to prevent fall     Total Score 25     Berg comment: BERG  < 36 high risk for falls (close to 100%) 46-51 moderate (>50%)   37-45 significant (>80%) 52-55 lower (> 25%)                  CURRENT PROSTHETIC WEAR ASSESSMENT: Patient is dependent with: skin check, residual limb care, care of non-amputated limb, prosthetic cleaning, ply sock cleaning, correct ply sock adjustment, proper wear schedule/adjustment, and proper weight-bearing schedule/adjustment Donning prosthesis: Mod A and requires instructions for fit, donning Doffing prosthesis: SBA Prosthetic wear tolerance: 2 hours/day for 2 days since delivery (Saturday, Sunday) Prosthetic weight bearing tolerance: up to ~8 minutes with intermittent UE support with no complaints of residual limb pain or issues Edema: present Residual limb condition: one scab 53m on lateral incision, one open wound superficial 676mdistal tibia, limb is cylindrical with normal temperature and color Prosthetic description: silicone liner with pin lock suspension, total contact socket, dynamic response foot     TODAY'S TREATMENT:  02/21/2022 Prosthetic Training with Transtibial Amputation prosthesis Residual limb is looking well with scab on lateral incision measuring [id number] at the widest part PT educated on purpose of building wear time with recommended wear during illness and with consistent wear times of 5 hours 2x/day with 2 hour break, pt verbalized understanding PT educated on signs of sweating and drying liner/limb, pt verbalized understanding PT demo sit to/from stand in chair without armrests with visual and verbal explanation. Pt returned demo in chair with armrests x 2, chair without armrests x 5 with SBA and verbal cueing, pt showed increased confidence with secure table in front of chair. PT suggested adding to HEP 2 x 5 reps, 3x day at dining room or kitchen table, pt verbalized understanding. Pt partial squat in chair without armrests to practice anterior and posterior scooting x 2 ea. SBA and verbal cueing for technique Pt picked  up cloth from floor x 6 between two chairs with SBA, required verbal cueing for foot  placement  02/15/2022 Prosthetic Training with Transtibial Amputation prosthesis Pt arrived to session feeling need to add a sock ply and discussed indication for changing ply with PT PT advised using bar stool for seated rest and ADLs to improve postural control and engagement, pt verbalized understanding and sat 5 min on 24" bar stool and 5 min on 29" mat simulating bar stool sitting with pt reporting feeling safe Scab on lateral incision is 8 mm and hair growth observed on lateral limb  Neuro Re-ed: Pt instructed in balance HEP at sink as noted below with verbal, visual, tactile curing for weight bearing through prosthesis and form, cues on using pressure on socket for proprioception, goal to improve standing balance & standing endurance.    02/14/2022 Prosthetic Training with Transtibial Amputation prosthesis Large dry scab on lateral incision which PT removed with scissors as only held on with one small area.  PT recommended wear 4 hrs 2x/day daily weather staying in home or going out. Pt verbalized understanding. PT demo & instructed in adjusting ply socks with too few, too many and correct ply fit.  Pt verbalized better understanding.  PT demo & verbal cues on picking up item from floor. Pt return demo 3 reps safely. PT demo & verbal cues on technique to neg ramp & curb with cane. Pt performed with min guard.  PT instructed in stairs with single rail & cane.  Pt neg 3 steps with right rail & 3 steps with left rail.       HOME EXERCISE PROGRAM:   02/15/22 1214  PT Education  Education Details HEP at sink  Person(s) Educated Patient  Methods Explanation;Demonstration;Tactile cues;Verbal cues;Handout  Comprehension Verbalized understanding;Returned demonstration;Verbal cues required;Tactile cues required    Do each exercise 1-2  times per day Do each exercise 5-10 repetitions Hold each exercise for 2 seconds to feel your location  AT SINK FIND YOUR MIDLINE POSITION AND PLACE  FEET EQUAL DISTANCE FROM THE MIDLINE.  Try to find this position when standing still for activities.   USE TAPE ON FLOOR TO MARK THE MIDLINE POSITION which is even with middle of sink.  You also should try to feel with your limb pressure in socket.  You are trying to feel with limb what you used to feel with the bottom of your foot.  Side to Side Shift: Moving your hips only (not shoulders): move weight onto your left leg, HOLD/FEEL pressure in socket.  Move back to equal weight on each leg, HOLD/FEEL pressure in socket. Move weight onto your right leg, HOLD/FEEL pressure in socket. Move back to equal weight on each leg, HOLD/FEEL pressure in socket. Repeat.  Start with both hands on sink, progress to hand on prosthetic side only, then no hands.  Front to Back Shift: Moving your hips only (not shoulders): move your weight forward onto your toes, HOLD/FEEL pressure in socket. Move your weight back to equal Flat Foot on both legs, HOLD/FEEL  pressure in socket. Move your weight back onto your heels, HOLD/FEEL  pressure in socket. Move your weight back to equal on both legs, HOLD/FEEL  pressure in socket. Repeat.  Start with both hands on sink, progress to hand on prosthetic side only, then no hands.  Moving Cones / Cups: With equal weight on each leg: Hold on with one hand the first time, then progress to no hand supports. Move cups from one   side of sink to the other. Place cups ~2" out of your reach, progress to 10" beyond reach.  Place one hand in middle of sink and reach with other hand. Do both arms.  Then hover one hand and move cups with other hand.  Overhead/Upward Reaching: alternated reaching up to top cabinets or ceiling if no cabinets present. Keep equal weight on each leg. Start with one hand support on counter while other hand reaches and progress to no hand support with reaching.  ace one hand in middle of sink and reach with other hand. Do both arms.  Then hover one hand and move cups with other  hand.  5.   Looking Over Shoulders: With equal weight on each leg: alternate turning to look over your shoulders with one hand support on counter as needed.  Start with head motions only to look in front of shoulder, then even with shoulder and progress to looking behind you. To look to side, move head /eyes, then shoulder on side looking pulls back, shift more weight to side looking and pull hip back. Place one hand in middle of sink and let go with other hand so your shoulder can pull back. Switch hands to look other way.   Then hover one hand and look over shoulder. If looking right, use left hand at sink. If looking left, use right hand at sink. 6.  Stepping with leg that is not amputated:  Move items under cabinet out of your way. Shift your hips/pelvis so weight on prosthesis. Tighten muscles in hip on prosthetic side.  SLOWLY step other leg so front of foot is in cabinet. Then step back to floor.     ASSESSMENT:   CLINICAL IMPRESSION: Today's session included updated prosthesis wear recommendations following report of inconsistent wear over the weekend due to illness and schedule of day, along with education on signs and management of sweating. She reported decreased confidence with sit to/from stand from chairs without armrests and it was suggested to practice at home with table in front of chair. Picking up items from the ground was reinforced and pt required cueing for form, but reported improved confidence with performing task at home with chair set up for safety. She continues to benefit from skilled PT.   OBJECTIVE IMPAIRMENTS Abnormal gait, decreased activity tolerance, decreased balance, decreased endurance, decreased knowledge of use of DME, decreased mobility, difficulty walking, decreased strength, postural dysfunction, and prosthetic dependency .    ACTIVITY LIMITATIONS carrying, lifting, bending, sitting, standing, squatting, stairs, transfers, locomotion level, and caring for  others   PARTICIPATION LIMITATIONS: meal prep, cleaning, laundry, driving, community activity, and occupation   PERSONAL FACTORS Fitness, Time since onset of injury/illness/exacerbation, and 3+ comorbidities: see PMH  are also affecting patient's functional outcome.    REHAB POTENTIAL: Good   CLINICAL DECISION MAKING: Evolving/moderate complexity   EVALUATION COMPLEXITY: Moderate     GOALS: Goals reviewed with patient? Yes   SHORT TERM GOALS: Target date: 03/03/2022   Patient donnes prosthesis modified independent & verbalizes adjustment ply socks. Baseline: SEE OBJECTIVE DATA Goal status: met 02/22/22   2.  Patient tolerates prosthesis >10 hrs total /day without increase in skin issues or no limb pain after standing. Baseline: SEE OBJECTIVE DATA Goal status: INITIAL   3.  Berg Balance >36/56 Baseline: SEE OBJECTIVE DATA Goal status: INITIAL   4. Patient ambulates 200' with cane & prosthesis with supervision. Baseline: SEE OBJECTIVE DATA Goal status: INITIAL   5. Patient negotiates ramps &   curbs with RW & prosthesis with modified independent and with cane with minA Baseline: SEE OBJECTIVE DATA Goal status: INITIAL     LONG TERM GOALS: Target date: 04/28/2022   Patient demonstrates & verbalizes understanding of prosthetic care to enable safe utilization of prosthesis. Baseline: SEE OBJECTIVE DATA Goal status: INITIAL   Patient tolerates prosthesis wear >90% of awake hours without skin or limb pain issues. Baseline: SEE OBJECTIVE DATA Goal status: INITIAL   Berg Balance task >/= 46/56 to indicate lower fall risk Baseline: SEE OBJECTIVE DATA Goal status: INITIAL   Patient safely ambulates >500' with prosthesis & cane or less independently. Baseline: SEE OBJECTIVE DATA Goal status: INITIAL   Patient negotiates ramps, curbs & stairs with single rail with prosthesis & cane or less independently. Baseline: SEE OBJECTIVE DATA Goal status: INITIAL   6.   Patient  verbalizes & demonstrates work simulated tasks like lifting & carrying. Baseline: SEE OBJECTIVE DATA Goal status: INITIAL       PLAN: PT FREQUENCY: 1-2x/week   PT DURATION: 12 weeks   PLANNED INTERVENTIONS: Therapeutic exercises, Therapeutic activity, Neuromuscular re-education, Balance training, Gait training, Patient/Family education, Self Care, Stair training, Prosthetic training, DME instructions, scar mobilization, Ultrasound, Ionotophoresis 62m/ml Dexamethasone, Manual therapy, Re-evaluation, and physical performance tests   PLAN FOR NEXT SESSION: Ask about STS and picking up items from floor. Assess STG - prosthetic wear, Berg, amb with cane 200' and ramp/curb   KJana Hakim Student-PT 02/22/2022, 9:45 AM  This entire session of physical therapy was performed under the direct supervision of PT signing evaluation /treatment. PT reviewed note and agrees.  RJamey Reas PT, DPT 02/22/2022, 1:35 PM

## 2022-02-22 ENCOUNTER — Ambulatory Visit (INDEPENDENT_AMBULATORY_CARE_PROVIDER_SITE_OTHER): Payer: BC Managed Care – PPO | Admitting: Physical Therapy

## 2022-02-22 ENCOUNTER — Encounter: Payer: Self-pay | Admitting: Physical Therapy

## 2022-02-22 DIAGNOSIS — L98498 Non-pressure chronic ulcer of skin of other sites with other specified severity: Secondary | ICD-10-CM | POA: Diagnosis not present

## 2022-02-22 DIAGNOSIS — R2681 Unsteadiness on feet: Secondary | ICD-10-CM

## 2022-02-22 DIAGNOSIS — R2689 Other abnormalities of gait and mobility: Secondary | ICD-10-CM | POA: Diagnosis not present

## 2022-02-22 DIAGNOSIS — M6281 Muscle weakness (generalized): Secondary | ICD-10-CM | POA: Diagnosis not present

## 2022-02-22 DIAGNOSIS — R293 Abnormal posture: Secondary | ICD-10-CM

## 2022-02-28 ENCOUNTER — Encounter: Payer: Self-pay | Admitting: Physical Therapy

## 2022-02-28 ENCOUNTER — Inpatient Hospital Stay: Payer: BC Managed Care – PPO | Admitting: Physical Medicine and Rehabilitation

## 2022-02-28 ENCOUNTER — Ambulatory Visit (INDEPENDENT_AMBULATORY_CARE_PROVIDER_SITE_OTHER): Payer: BC Managed Care – PPO | Admitting: Physical Therapy

## 2022-02-28 DIAGNOSIS — R2689 Other abnormalities of gait and mobility: Secondary | ICD-10-CM

## 2022-02-28 DIAGNOSIS — R2681 Unsteadiness on feet: Secondary | ICD-10-CM

## 2022-02-28 DIAGNOSIS — L98498 Non-pressure chronic ulcer of skin of other sites with other specified severity: Secondary | ICD-10-CM | POA: Diagnosis not present

## 2022-02-28 DIAGNOSIS — M6281 Muscle weakness (generalized): Secondary | ICD-10-CM

## 2022-02-28 DIAGNOSIS — R293 Abnormal posture: Secondary | ICD-10-CM

## 2022-02-28 NOTE — Patient Instructions (Signed)
Driving options with Below Knee Prosthesis   Option 1:  Remove prosthesis and use left leg to crossover drive Option 2:  2 Foot driving - right foot on gas & left foot on brake.  Prosthetic motion is leg press motion not ankle pump.  Focus is on heel pressing out not on toes/forefoot.   Option 3: Use prosthesis on both gas & brake.  Continue to focus on heel motion & position.  Option 4: Left foot accelerator.  Consider portable left foot accelerator ( http://plfa.org ) as allows to switch between cars or remove for other drivers.  Left foot both gas & brake.  Using cruise control to accelerate & decelerate or resume speed after stopping can safe some leg work / motion.   Practice initially in large empty parking lot. Stay in middle not near curbs. Practice turning & backing into parking spot on right & left. Parallel park.  Back up.  Quick stops. More about learning foot work not staying in motion.   

## 2022-02-28 NOTE — Therapy (Signed)
OUTPATIENT PHYSICAL THERAPY TREATMENT NOTE   Patient Name: Valerie Le MRN: [id number] DOB:June 03, 1971, 51 y.o., female Today's Date: 02/28/2022  PCP: Willeen Niece, PA REFERRING PROVIDER: Newt Minion, MD  END OF SESSION:   PT End of Session - 02/28/22 1354     Visit Number 5    Number of Visits 25    Date for PT Re-Evaluation 04/28/22    Authorization Type BCBS    Authorization Time Period $50 co-pay    Progress Note Due on Visit 10    PT Start Time 1352    PT Stop Time 1431    PT Time Calculation (min) 39 min    Equipment Utilized During Treatment Gait belt    Activity Tolerance Patient tolerated treatment well    Behavior During Therapy WFL for tasks assessed/performed             Past Medical History:  Diagnosis Date   Anemia    Asthma    as a child   Depression    Diabetes mellitus without complication ([place])    Type 1 - Has Insulin Pump   Headache    migraines   High cholesterol    Hypertension    Past Surgical History:  Procedure Laterality Date   AMPUTATION Right 12/22/2020   Procedure: RIGHT TRANSMETATARSAL AMPUTATION;  Surgeon: Newt Minion, MD;  Location: [facility];  Service: Orthopedics;  Laterality: Right;   AMPUTATION Right 06/29/2021   Procedure: REVISION RIGHT TRANSMETATARSAL AMPUTATION;  Surgeon: Newt Minion, MD;  Location: [facility];  Service: Orthopedics;  Laterality: Right;   AMPUTATION Right 11/11/2021   Procedure: RIGHT BELOW KNEE AMPUTATION;  Surgeon: Newt Minion, MD;  Location: [facility];  Service: Orthopedics;  Laterality: Right;   AMPUTATION TOE Right 12/04/2020   Procedure: AMPUTATION RIGHT SECOND TOE, I&D FOOT;  Surgeon: Marybelle Killings, MD;  Location: [facility];  Service: Orthopedics;  Laterality: Right;  Right second toe amputation, I&D right foot.   APPLICATION OF WOUND VAC  11/11/2021   Procedure: APPLICATION OF WOUND VAC;  Surgeon: Newt Minion, MD;  Location: [facility];  Service: Orthopedics;;   birth control implant   07/31/2006   Essure Implant   TONSILLECTOMY     TYMPANOSTOMY TUBE PLACEMENT     WISDOM TOOTH EXTRACTION     Patient Active Problem List   Diagnosis Date Noted   S/P BKA (below knee amputation), right ([place]) 11/16/2021   Below-knee amputation of right lower extremity ([place]) 11/11/2021   Hypoalbuminemia 11/03/2021   Non-pressure chronic ulcer of other part of right foot limited to breakdown of skin ([facility])    Chest pain 11/02/2021   Elevated troponin 11/02/2021   SIRS (systemic inflammatory response syndrome) ([place]) 11/02/2021   Diabetes mellitus type 1 ([place]) 11/02/2021   Thrombocytosis 11/02/2021   Microcytic hypochromic anemia 11/02/2021   Hypokalemia 11/02/2021   Morbid obesity ([place]) 11/02/2021   Medication monitoring encounter 06/15/2021   Smoking 02/01/2021   Acute hematogenous osteomyelitis of right foot ([place]) 12/22/2020   Osteomyelitis of foot, right, acute ([place])    Dehiscence of amputation stump ([place])    Diabetic foot infection ([place]) 12/02/2020   Essential hypertension 12/02/2020   Hyperlipidemia 08/21/2018   Bipolar affective disorder, currently depressed, moderate ([place]) 01/23/2017   Insulin-treated type 2 diabetes mellitus ([place]) 03/07/2013   Asthma 03/07/2013   ADHD (attention deficit hyperactivity disorder) 03/07/2013   Migraine 03/07/2013   Tobacco abuse 03/07/2013    REFERRING DIAG: K02.542H (ICD-10-CM) - Below-knee  amputation of right lower extremity ([place])    ONSET DATE: 01/27/2022 prosthesis delivery  THERAPY DIAG:  Other abnormalities of gait and mobility  Unsteadiness on feet  Muscle weakness (generalized)  Non-pressure chronic ulcer of skin of other sites with other specified severity ([place])  Abnormal posture  Rationale for Evaluation and Treatment Rehabilitation  PERTINENT HISTORY:  Rt TTA 11/11/21 due to osteomyelitis of Transmetatarsal Amputation, HTN, DM type 1, obesity, asthma as child, depression, HA migraines  PRECAUTIONS: None  SUBJECTIVE: She  has been doing well. At her eye doctor appointment today she sat in a rolling chair without armrests. She walks without cane more but carries it for longer distances. She wears the prosthesis 5 hours 2x/day and will take the liner off to change under liner for sweat. She is awake/out of the bed 13-16 hours per day. She asked about an OT driving evaluation as mentioned by [facility].  PAIN:  Are you having pain? No    OBJECTIVE: (objective measures completed at initial evaluation unless otherwise dated)    COGNITION: Overall cognitive status: Within functional limits for tasks assessed              SENSATION: Not tested   POSTURE: rounded shoulders, forward head, and weight shift left   LOWER EXTREMITY ROM:   ROM Right eval Left eval  Hip flexion      Hip extension      Hip abduction      Hip adduction      Hip internal rotation      Hip external rotation      Knee flexion      Knee extension      Ankle dorsiflexion      Ankle plantarflexion      Ankle inversion      Ankle eversion       (Blank rows = not tested)   LOWER EXTREMITY MMT:   MMT Right eval Left eval  Hip flexion      Hip extension      Hip abduction      Hip adduction      Hip internal rotation      Hip external rotation      Knee flexion      Knee extension      Ankle dorsiflexion      Ankle plantarflexion      Ankle inversion      Ankle eversion      (Blank rows = not tested)   TRANSFERS: 02/22/22 STS with SBA on chair without armrests, uses BUE and stabilizes without UE support  01/30/22 Sit to stand: SBA with verbal cueing for scooting forward in chair, used BUE to stand but could stabilize without UE support on RW Stand to sit: SBA, required BUE support to control descent   GAIT: Gait pattern: step to pattern, decreased step length- Left, decreased stance time- Right, decreased stride length, decreased hip/knee flexion- Right, Right hip hike, knee flexed in stance- Right, antalgic, narrow BOS,  and abducted- Right Distance walked: 30' Assistive device utilized: Environmental consultant - 2 wheeled Level of assistance: SBA Gait velocity: 1.10 ft/sec Comments: excessive weight bearing BUEs on RW and partial weight on prosthesis   FUNCTIONAL TESTs:   Berg Balance Scale: 02/28/22 score 40/56  [facility] PT Assessment - 02/28/22 1414       Standardized Balance Assessment   Standardized Balance Assessment Berg Balance Test      Berg Balance Test   Sit to Stand Able to stand  independently using hands    Standing Unsupported Able to stand safely 2 minutes    Sitting with Back Unsupported but Feet Supported on Floor or Stool Able to sit safely and securely 2 minutes    Stand to Sit Controls descent by using hands    Transfers Able to transfer safely, definite need of hands    Standing Unsupported with Eyes Closed Able to stand 10 seconds safely    Standing Unsupported with Feet Together Able to place feet together independently and stand 1 minute safely    From Standing, Reach Forward with Outstretched Arm Can reach forward >12 cm safely (5")    From Standing Position, Pick up Object from [place] to pick up shoe safely and easily    From Standing Position, Turn to Look Behind Over each Shoulder Looks behind one side only/other side shows less weight shift    Turn 360 Degrees Able to turn 360 degrees safely but slowly    Standing Unsupported, Alternately Place Feet on Step/Stool Needs assistance to keep from falling or unable to try    Standing Unsupported, One Foot in Front Able to take small step independently and hold 30 seconds    Standing on One Leg Tries to lift leg/unable to hold 3 seconds but remains standing independently    Total Score 40             Berg Balance Scale: 01/30/22 score 25/56   [facility] PT Assessment - 01/30/22 1110                Standardized Balance Assessment    Standardized Balance Assessment Berg Balance Test          Berg Balance Test    Sit to Stand Able to stand  using hands after several tries     Standing Unsupported Needs several tries to stand 30 seconds unsupported     Sitting with Back Unsupported but Feet Supported on Floor or Stool Able to sit safely and securely 2 minutes     Stand to Sit Controls descent by using hands     Transfers Able to transfer safely, definite need of hands     Standing Unsupported with Eyes Closed Unable to keep eyes closed 3 seconds but stays steady     Standing Unsupported with Feet Together Needs help to attain position but able to stand for 30 seconds with feet together     From Standing, Reach Forward with Outstretched Arm Can reach confidently >25 cm (10")     From Standing Position, Pick up Object from Floor Able to pick up shoe, needs supervision     From Standing Position, Turn to Look Behind Over each Shoulder Looks behind one side only/other side shows less weight shift     Turn 360 Degrees Needs assistance while turning     Standing Unsupported, Alternately Place Feet on Step/Stool Needs assistance to keep from falling or unable to try     Standing Unsupported, One Foot in [employer] balance while stepping or standing     Standing on One Leg Unable to try or needs assist to prevent fall     Total Score 25     Berg comment: BERG  < 36 high risk for falls (close to 100%) 46-51 moderate (>50%)   37-45 significant (>80%) 52-55 lower (> 25%)                 CURRENT PROSTHETIC WEAR ASSESSMENT: Patient is dependent with: skin  check, residual limb care, care of non-amputated limb, prosthetic cleaning, ply sock cleaning, correct ply sock adjustment, proper wear schedule/adjustment, and proper weight-bearing schedule/adjustment Donning prosthesis: Mod A and requires instructions for fit, donning Doffing prosthesis: SBA Prosthetic wear tolerance: 2 hours/day for 2 days since delivery (Saturday, Sunday) Prosthetic weight bearing tolerance: up to ~8 minutes with intermittent UE support with no complaints of  residual limb pain or issues Edema: present Residual limb condition: one scab 44m on lateral incision, one open wound superficial 660mdistal tibia, limb is cylindrical with normal temperature and color Prosthetic description: silicone liner with pin lock suspension, total contact socket, dynamic response foot     TODAY'S TREATMENT:  02/28/2022 Prosthetic Training with Transtibial Amputation prosthesis: Scab on lateral incision measuring 34m102my 4mm59mo signs of infection PT wear time recommendations progressed to all day with 2 hour break in middle of day and changing under liner midpoint both wears due to sweat & switching liners at bathing to enable to clean without being late at night. Pt verbalized understanding PT advised driving recommendations with practicing in empty parking lot, pt verbalized understanding and received printed instructions (see pt instructions for details)  Physical performance tests: BergMerrilee Janskyting as noted above (improved to 40/56 from 25/56)   02/21/2022 Prosthetic Training with Transtibial Amputation prosthesis Residual limb is looking well with scab on lateral incision measuring 9mm 19moss at the widest part PT educated on purpose of building wear time with recommended wear during illness and with consistent wear times of 5 hours 2x/day with 2 hour break, pt verbalized understanding PT educated on signs of sweating and drying liner/limb, pt verbalized understanding PT demo sit to/from stand in chair without armrests with visual and verbal explanation. Pt returned demo in chair with armrests x 2, chair without armrests x 5 with SBA and verbal cueing, pt showed increased confidence with secure table in front of chair. PT suggested adding to HEP 2 x 5 reps, 3x day at dining room or kitchen table, pt verbalized understanding. Pt partial squat in chair without armrests to practice anterior and posterior scooting x 2 ea. SBA and verbal cueing for technique Pt picked up  cloth from floor x 6 between two chairs with SBA, required verbal cueing for foot placement  02/15/2022 Prosthetic Training with Transtibial Amputation prosthesis Pt arrived to session feeling need to add a sock ply and discussed indication for changing ply with PT PT advised using bar stool for seated rest and ADLs to improve postural control and engagement, pt verbalized understanding and sat 5 min on 24" bar stool and 5 min on 29" mat simulating bar stool sitting with pt reporting feeling safe Scab on lateral incision is 8 mm and hair growth observed on lateral limb  Neuro Re-ed: Pt instructed in balance HEP at sink as noted below with verbal, visual, tactile curing for weight bearing through prosthesis and form, cues on using pressure on socket for proprioception, goal to improve standing balance & standing endurance.      HOME EXERCISE PROGRAM:   02/15/22 1214  PT Education  Education Details HEP at sink  Person(s) Educated Patient  Methods Explanation;Demonstration;Tactile cues;Verbal cues;Handout  Comprehension Verbalized understanding;Returned demonstration;Verbal cues required;Tactile cues required    Do each exercise 1-2  times per day Do each exercise 5-10 repetitions Hold each exercise for 2 seconds to feel your location  AT SINK [place] to find this position when  standing still for activities.   USE TAPE ON FLOOR TO MARK THE MIDLINE POSITION which is even with middle of sink.  You also should try to feel with your limb pressure in socket.  You are trying to feel with limb what you used to feel with the bottom of your foot.  Side to Side Shift: Moving your hips only (not shoulders): move weight onto your left leg, HOLD/FEEL pressure in socket.  Move back to equal weight on each leg, HOLD/FEEL pressure in socket. Move weight onto your right leg, HOLD/FEEL pressure in socket. Move back to equal weight on  each leg, HOLD/FEEL pressure in socket. Repeat.  Start with both hands on sink, progress to hand on prosthetic side only, then no hands.  Front to Back Shift: Moving your hips only (not shoulders): move your weight forward onto your toes, HOLD/FEEL pressure in socket. Move your weight back to equal Flat Foot on both legs, HOLD/FEEL  pressure in socket. Move your weight back onto your heels, HOLD/FEEL  pressure in socket. Move your weight back to equal on both legs, HOLD/FEEL  pressure in socket. Repeat.  Start with both hands on sink, progress to hand on prosthetic side only, then no hands.  Moving Cones / Cups: With equal weight on each leg: Hold on with one hand the first time, then progress to no hand supports. Move cups from one side of sink to the other. Place cups ~2" out of your reach, progress to 10" beyond reach.  Place one hand in middle of sink and reach with other hand. Do both arms.  Then hover one hand and move cups with other hand.  Overhead/Upward Reaching: alternated reaching up to top cabinets or ceiling if no cabinets present. Keep equal weight on each leg. Start with one hand support on counter while other hand reaches and progress to no hand support with reaching.  ace one hand in middle of sink and reach with other hand. Do both arms.  Then hover one hand and move cups with other hand.  5.   Looking Over Shoulders: With equal weight on each leg: alternate turning to look over your shoulders with one hand support on counter as needed.  Start with head motions only to look in front of shoulder, then even with shoulder and progress to looking behind you. To look to side, move head /eyes, then shoulder on side looking pulls back, shift more weight to side looking and pull hip back. Place one hand in middle of sink and let go with other hand so your shoulder can pull back. Switch hands to look other way.   Then hover one hand and look over shoulder. If looking right, use left hand at sink. If  looking left, use right hand at sink. 6.  Stepping with leg that is not amputated:  Move items under cabinet out of your way. Shift your hips/pelvis so weight on prosthesis. Tighten muscles in hip on prosthetic side.  SLOWLY step other leg so front of foot is in cabinet. Then step back to floor.     ASSESSMENT:   CLINICAL IMPRESSION: Merrilee Jansky testing improved from 25/56 to 40/56 indicating reduced risk of falling, though further progress is anticipated by discharge as <45/56 still indicates fall risk. She was advised to practice driving in open parking lot with multiple suggestions for safe pedal operation, and verbalized understanding with recommendations. Prosthesis wear has progressed with recommendation to increase to all waking hours minus a 2 hour break  in middle of her day, and she is tolerating increased wear with reduced size of scab on residual limb. She is continuing to benefit from skilled PT.   OBJECTIVE IMPAIRMENTS Abnormal gait, decreased activity tolerance, decreased balance, decreased endurance, decreased knowledge of use of DME, decreased mobility, difficulty walking, decreased strength, postural dysfunction, and prosthetic dependency .    ACTIVITY LIMITATIONS carrying, lifting, bending, sitting, standing, squatting, stairs, transfers, locomotion level, and caring for others   PARTICIPATION LIMITATIONS: meal prep, cleaning, laundry, driving, community activity, and occupation   [place], Time since onset of injury/illness/exacerbation, and 3+ comorbidities: see PMH  are also affecting patient's functional outcome.    REHAB POTENTIAL: Good   CLINICAL DECISION MAKING: Evolving/moderate complexity   EVALUATION COMPLEXITY: Moderate     GOALS: Goals reviewed with patient? Yes   SHORT TERM GOALS: Target date: 03/03/2022   Patient donnes prosthesis modified independent & verbalizes adjustment ply socks. Baseline: SEE OBJECTIVE DATA Goal status: met 02/22/22   2.   Patient tolerates prosthesis >10 hrs total /day without increase in skin issues or no limb pain after standing. Baseline: SEE OBJECTIVE DATA Goal status: met 02/28/22   3.  Berg Balance >36/56 Baseline: SEE OBJECTIVE DATA Goal status: met 02/28/22   4. Patient ambulates 200' with cane & prosthesis with supervision. Baseline: SEE OBJECTIVE DATA Goal status: INITIAL   5. Patient negotiates ramps & curbs with RW & prosthesis with modified independent and with cane with minA Baseline: SEE OBJECTIVE DATA Goal status: INITIAL     LONG TERM GOALS: Target date: 04/28/2022   Patient demonstrates & verbalizes understanding of prosthetic care to enable safe utilization of prosthesis. Baseline: SEE OBJECTIVE DATA Goal status: INITIAL   Patient tolerates prosthesis wear >90% of awake hours without skin or limb pain issues. Baseline: SEE OBJECTIVE DATA Goal status: INITIAL   Berg Balance task >/= 46/56 to indicate lower fall risk Baseline: SEE OBJECTIVE DATA Goal status: INITIAL   Patient safely ambulates >500' with prosthesis & cane or less independently. Baseline: SEE OBJECTIVE DATA Goal status: INITIAL   Patient negotiates ramps, curbs & stairs with single rail with prosthesis & cane or less independently. Baseline: SEE OBJECTIVE DATA Goal status: INITIAL   6.   Patient verbalizes & demonstrates work simulated tasks like lifting & carrying. Baseline: SEE OBJECTIVE DATA Goal status: INITIAL       PLAN: PT FREQUENCY: 1-2x/week   PT DURATION: 12 weeks   PLANNED INTERVENTIONS: Therapeutic exercises, Therapeutic activity, Neuromuscular re-education, Balance training, Gait training, Patient/Family education, Self Care, Stair training, Prosthetic training, DME instructions, scar mobilization, Ultrasound, Ionotophoresis 63m/ml Dexamethasone, Manual therapy, Re-evaluation, and physical performance tests   PLAN FOR NEXT SESSION: Send next note to MD. Assess STG amb what cane and  ramp/curb. STS on rolling chairs in anticipation of starting work next week   KJana Hakim SMontgomery9/05/2022, 4:02 PM  This entire session of physical therapy was performed under the direct supervision of PT signing evaluation /treatment. PT reviewed note and agrees.  RJamey Reas PT, DPT 02/28/2022, 4:30 PM

## 2022-03-03 ENCOUNTER — Ambulatory Visit (INDEPENDENT_AMBULATORY_CARE_PROVIDER_SITE_OTHER): Payer: BC Managed Care – PPO | Admitting: Physical Therapy

## 2022-03-03 ENCOUNTER — Encounter: Payer: Self-pay | Admitting: Physical Therapy

## 2022-03-03 DIAGNOSIS — R2689 Other abnormalities of gait and mobility: Secondary | ICD-10-CM | POA: Diagnosis not present

## 2022-03-03 DIAGNOSIS — M6281 Muscle weakness (generalized): Secondary | ICD-10-CM | POA: Diagnosis not present

## 2022-03-03 DIAGNOSIS — R2681 Unsteadiness on feet: Secondary | ICD-10-CM

## 2022-03-03 NOTE — Therapy (Signed)
OUTPATIENT PHYSICAL THERAPY TREATMENT NOTE   Patient Name: ALAIYAH BOLLMAN MRN: [id number] DOB:[birth year], 51 y.o., female Today's Date: 03/03/2022  PCP: Willeen Niece, PA REFERRING PROVIDER: Newt Minion, MD  END OF SESSION:   PT End of Session - 03/03/22 1131     Visit Number 6    Number of Visits 25    Date for PT Re-Evaluation 04/28/22    Authorization Type [employer]    Authorization Time Period $50 co-pay    Progress Note Due on Visit 10    PT Start Time 1111    PT Stop Time 1145    PT Time Calculation (min) 34 min    Equipment Utilized During Treatment Gait belt    Activity Tolerance Patient tolerated treatment well    Behavior During Therapy WFL for tasks assessed/performed             Past Medical History:  Diagnosis Date   Anemia    Asthma    as a child   Depression    Diabetes mellitus without complication ([place])    Type 1 - Has Insulin Pump   Headache    migraines   High cholesterol    Hypertension    Past Surgical History:  Procedure Laterality Date   AMPUTATION Right 12/22/2020   Procedure: RIGHT TRANSMETATARSAL AMPUTATION;  Surgeon: Newt Minion, MD;  Location: [facility];  Service: Orthopedics;  Laterality: Right;   AMPUTATION Right 06/29/2021   Procedure: REVISION RIGHT TRANSMETATARSAL AMPUTATION;  Surgeon: Newt Minion, MD;  Location: [facility];  Service: Orthopedics;  Laterality: Right;   AMPUTATION Right 11/11/2021   Procedure: RIGHT BELOW KNEE AMPUTATION;  Surgeon: Newt Minion, MD;  Location: [facility];  Service: Orthopedics;  Laterality: Right;   AMPUTATION TOE Right 12/04/2020   Procedure: AMPUTATION RIGHT SECOND TOE, I&D FOOT;  Surgeon: Marybelle Killings, MD;  Location: [facility];  Service: Orthopedics;  Laterality: Right;  Right second toe amputation, I&D right foot.   APPLICATION OF WOUND VAC  11/11/2021   Procedure: APPLICATION OF WOUND VAC;  Surgeon: Newt Minion, MD;  Location: [facility];  Service: Orthopedics;;   birth control implant   07/31/2006   Essure Implant   TONSILLECTOMY     TYMPANOSTOMY TUBE PLACEMENT     WISDOM TOOTH EXTRACTION     Patient Active Problem List   Diagnosis Date Noted   S/P BKA (below knee amputation), right ([place]) 11/16/2021   Below-knee amputation of right lower extremity ([place]) 11/11/2021   Hypoalbuminemia 11/03/2021   Non-pressure chronic ulcer of other part of right foot limited to breakdown of skin ([facility])    Chest pain 11/02/2021   Elevated troponin 11/02/2021   SIRS (systemic inflammatory response syndrome) ([place]) 11/02/2021   Diabetes mellitus type 1 ([place]) 11/02/2021   Thrombocytosis 11/02/2021   Microcytic hypochromic anemia 11/02/2021   Hypokalemia 11/02/2021   Morbid obesity ([place]) 11/02/2021   Medication monitoring encounter 06/15/2021   Smoking 02/01/2021   Acute hematogenous osteomyelitis of right foot ([place]) 12/22/2020   Osteomyelitis of foot, right, acute ([place])    Dehiscence of amputation stump ([place])    Diabetic foot infection ([place]) 12/02/2020   Essential hypertension 12/02/2020   Hyperlipidemia 08/21/2018   Bipolar affective disorder, currently depressed, moderate ([place]) 01/23/2017   Insulin-treated type 2 diabetes mellitus ([place]) 03/07/2013   Asthma 03/07/2013   ADHD (attention deficit hyperactivity disorder) 03/07/2013   Migraine 03/07/2013   Tobacco abuse 03/07/2013    REFERRING DIAG: W46.659D (ICD-10-CM) - Below-knee  amputation of right lower extremity ([place])    ONSET DATE: 01/27/2022 prosthesis delivery  THERAPY DIAG:  Other abnormalities of gait and mobility  Unsteadiness on feet  Muscle weakness (generalized)  Rationale for Evaluation and Treatment Rehabilitation  PERTINENT HISTORY:  Rt TTA 11/11/21 due to osteomyelitis of Transmetatarsal Amputation, HTN, DM type 1, obesity, asthma as child, depression, HA migraines  PRECAUTIONS: None  SUBJECTIVE: She has been having more shin pain.  PAIN:  Are you having pain? 3/10 shin pain in residual  limb.   OBJECTIVE: (objective measures completed at initial evaluation unless otherwise dated)    COGNITION: Overall cognitive status: Within functional limits for tasks assessed              SENSATION: Not tested   POSTURE: rounded shoulders, forward head, and weight shift left   LOWER EXTREMITY ROM:   ROM Right eval Left eval  Hip flexion      Hip extension      Hip abduction      Hip adduction      Hip internal rotation      Hip external rotation      Knee flexion      Knee extension      Ankle dorsiflexion      Ankle plantarflexion      Ankle inversion      Ankle eversion       (Blank rows = not tested)   LOWER EXTREMITY MMT:   MMT Right eval Left eval  Hip flexion      Hip extension      Hip abduction      Hip adduction      Hip internal rotation      Hip external rotation      Knee flexion      Knee extension      Ankle dorsiflexion      Ankle plantarflexion      Ankle inversion      Ankle eversion      (Blank rows = not tested)   TRANSFERS: 02/22/22 STS with SBA on chair without armrests, uses BUE and stabilizes without UE support  01/30/22 Sit to stand: SBA with verbal cueing for scooting forward in chair, used BUE to stand but could stabilize without UE support on RW Stand to sit: SBA, required BUE support to control descent   GAIT: Gait pattern: step to pattern, decreased step length- Left, decreased stance time- Right, decreased stride length, decreased hip/knee flexion- Right, Right hip hike, knee flexed in stance- Right, antalgic, narrow BOS, and abducted- Right Distance walked: 30' Assistive device utilized: Environmental consultant - 2 wheeled Level of assistance: SBA Gait velocity: 1.10 ft/sec Comments: excessive weight bearing BUEs on RW and partial weight on prosthesis   FUNCTIONAL TESTs:   Berg Balance Scale: 02/28/22 score 40/56    Berg Balance Scale: 01/30/22 score 25/56   [facility] PT Assessment - 01/30/22 1110                Standardized  Balance Assessment    Standardized Balance Assessment Berg Balance Test          Berg Balance Test    Sit to Stand Able to stand using hands after several tries     Standing Unsupported Needs several tries to stand 30 seconds unsupported     Sitting with Back Unsupported but Feet Supported on Floor or Stool Able to sit safely and securely 2 minutes     Stand to Sit Controls descent  by using hands     Transfers Able to transfer safely, definite need of hands     Standing Unsupported with Eyes Closed Unable to keep eyes closed 3 seconds but stays steady     Standing Unsupported with Feet Together Needs help to attain position but able to stand for 30 seconds with feet together     From Standing, Reach Forward with Outstretched Arm Can reach confidently >25 cm (10")     From Standing Position, Pick up Object from Floor Able to pick up shoe, needs supervision     From Standing Position, Turn to Look Behind Over each Shoulder Looks behind one side only/other side shows less weight shift     Turn 360 Degrees Needs assistance while turning     Standing Unsupported, Alternately Place Feet on Step/Stool Needs assistance to keep from falling or unable to try     Standing Unsupported, One Foot in [employer] balance while stepping or standing     Standing on One Leg Unable to try or needs assist to prevent fall     Total Score 25     Berg comment: BERG  < 36 high risk for falls (close to 100%) 46-51 moderate (>50%)   37-45 significant (>80%) 52-55 lower (> 25%)                 CURRENT PROSTHETIC WEAR ASSESSMENT: Patient is dependent with: skin check, residual limb care, care of non-amputated limb, prosthetic cleaning, ply sock cleaning, correct ply sock adjustment, proper wear schedule/adjustment, and proper weight-bearing schedule/adjustment Donning prosthesis: Mod A and requires instructions for fit, donning Doffing prosthesis: SBA Prosthetic wear tolerance: 2 hours/day for 2 days since delivery  (Saturday, Sunday) Prosthetic weight bearing tolerance: up to ~8 minutes with intermittent UE support with no complaints of residual limb pain or issues Edema: present Residual limb condition: one scab 232m on lateral incision, one open wound superficial 6532mdistal tibia, limb is cylindrical with normal temperature and color Prosthetic description: silicone liner with pin lock suspension, total contact socket, dynamic response foot     TODAY'S TREATMENT:  03/03/2022 Prosthetic Training with Transtibial Amputation prosthesis: Doffed and donned prosthesis to examine Scab on lateral incision (closed and no signs of infection) and to trouble shoot prosthetic fit. She was wearing 9 ply sock and still appeared to be sitting to low in prosthesis. Step ups fwd in bars 6 inch step with bilat UE support X 5 on Rt leg (for strengthening) then X 10 stepping up with left and down with Rt in front to simulate how she would properly perform stairs. Lateral step ups 6 inch step X 5 bilat with bilat UE support Dynamic balance walking in bars with head turns  X3 and head nods X 3 with intermittent UE support PRN Dynamic gait stepping over foam beam X 3 leading each way Balance on foam beam 1 min intemittent UE suppprt PRN Sit to stand standard chair using arm rests to ascend then no UE support to slowly descend with cues to avoid plop.    02/28/2022 Prosthetic Training with Transtibial Amputation prosthesis: Scab on lateral incision measuring [id number] 4mm61mo signs of infection PT wear time recommendations progressed to all day with 2 hour break in middle of day and changing under liner midpoint both wears due to sweat & switching liners at bathing to enable to clean without being late at night. Pt verbalized understanding PT advised driving recommendations with practicing in empty parking lot, pt  verbalized understanding and received printed instructions (see pt instructions for details)  Physical performance  tests: Merrilee Jansky testing as noted above (improved to 40/56 from 25/56)   02/21/2022 Prosthetic Training with Transtibial Amputation prosthesis Residual limb is looking well with scab on lateral incision measuring 34m across at the widest part PT educated on purpose of building wear time with recommended wear during illness and with consistent wear times of 5 hours 2x/day with 2 hour break, pt verbalized understanding PT educated on signs of sweating and drying liner/limb, pt verbalized understanding PT demo sit to/from stand in chair without armrests with visual and verbal explanation. Pt returned demo in chair with armrests x 2, chair without armrests x 5 with SBA and verbal cueing, pt showed increased confidence with secure table in front of chair. PT suggested adding to HEP 2 x 5 reps, 3x day at dining room or kitchen table, pt verbalized understanding. Pt partial squat in chair without armrests to practice anterior and posterior scooting x 2 ea. SBA and verbal cueing for technique Pt picked up cloth from floor x 6 between two chairs with SBA, required verbal cueing for foot placement  02/15/2022 Prosthetic Training with Transtibial Amputation prosthesis Pt arrived to session feeling need to add a sock ply and discussed indication for changing ply with PT PT advised using bar stool for seated rest and ADLs to improve postural control and engagement, pt verbalized understanding and sat 5 min on 24" bar stool and 5 min on 29" mat simulating bar stool sitting with pt reporting feeling safe Scab on lateral incision is 8 mm and hair growth observed on lateral limb  Neuro Re-ed: Pt instructed in balance HEP at sink as noted below with verbal, visual, tactile curing for weight bearing through prosthesis and form, cues on using pressure on socket for proprioception, goal to improve standing balance & standing endurance.      HOME EXERCISE PROGRAM:   02/15/22 1214  PT Education  Education Details HEP at  sink  Person(s) Educated Patient  Methods Explanation;Demonstration;Tactile cues;Verbal cues;Handout  Comprehension Verbalized understanding;Returned demonstration;Verbal cues required;Tactile cues required    Do each exercise 1-2  times per day Do each exercise 5-10 repetitions Hold each exercise for 2 seconds to feel your location  [place]  Try to find this position when standing still for activities.   USE TAPE ON FLOOR TO MARK THE MIDLINE POSITION which is even with middle of sink.  You also should try to feel with your limb pressure in socket.  You are trying to feel with limb what you used to feel with the bottom of your foot.  Side to Side Shift: Moving your hips only (not shoulders): move weight onto your left leg, HOLD/FEEL pressure in socket.  Move back to equal weight on each leg, HOLD/FEEL pressure in socket. Move weight onto your right leg, HOLD/FEEL pressure in socket. Move back to equal weight on each leg, HOLD/FEEL pressure in socket. Repeat.  Start with both hands on sink, progress to hand on prosthetic side only, then no hands.  Front to Back Shift: Moving your hips only (not shoulders): move your weight forward onto your toes, HOLD/FEEL pressure in socket. Move your weight back to equal Flat Foot on both legs, HOLD/FEEL  pressure in socket. Move your weight back onto your heels, HOLD/FEEL  pressure in socket. Move your weight back to equal on both legs, HOLD/FEEL  pressure  in socket. Repeat.  Start with both hands on sink, progress to hand on prosthetic side only, then no hands.  Moving Cones / Cups: With equal weight on each leg: Hold on with one hand the first time, then progress to no hand supports. Move cups from one side of sink to the other. Place cups ~2" out of your reach, progress to 10" beyond reach.  Place one hand in middle of sink and reach with other hand. Do both arms.  Then hover one hand  and move cups with other hand.  Overhead/Upward Reaching: alternated reaching up to top cabinets or ceiling if no cabinets present. Keep equal weight on each leg. Start with one hand support on counter while other hand reaches and progress to no hand support with reaching.  ace one hand in middle of sink and reach with other hand. Do both arms.  Then hover one hand and move cups with other hand.  5.   Looking Over Shoulders: With equal weight on each leg: alternate turning to look over your shoulders with one hand support on counter as needed.  Start with head motions only to look in front of shoulder, then even with shoulder and progress to looking behind you. To look to side, move head /eyes, then shoulder on side looking pulls back, shift more weight to side looking and pull hip back. Place one hand in middle of sink and let go with other hand so your shoulder can pull back. Switch hands to look other way.   Then hover one hand and look over shoulder. If looking right, use left hand at sink. If looking left, use right hand at sink. 6.  Stepping with leg that is not amputated:  Move items under cabinet out of your way. Shift your hips/pelvis so weight on prosthesis. Tighten muscles in hip on prosthetic side.  SLOWLY step other leg so front of foot is in cabinet. Then step back to floor.     ASSESSMENT:   CLINICAL IMPRESSION: Overall I feel she is progressing well and has met short term goals for the most part (unable to assess last short term goal due to time contraints)  She will see prosthetist today after her session and it appears she will need to have some pads added as she is wearing 9 ply sock and still is too loose. I progressed her dynamic balance exercises and strength program today with good overall tolerance noted. She will continue to benefit from skilled PT to work on her long term functional goals.    OBJECTIVE IMPAIRMENTS Abnormal gait, decreased activity tolerance, decreased balance,  decreased endurance, decreased knowledge of use of DME, decreased mobility, difficulty walking, decreased strength, postural dysfunction, and prosthetic dependency .    ACTIVITY LIMITATIONS carrying, lifting, bending, sitting, standing, squatting, stairs, transfers, locomotion level, and caring for others   PARTICIPATION LIMITATIONS: meal prep, cleaning, laundry, driving, community activity, and occupation   [place], Time since onset of injury/illness/exacerbation, and 3+ comorbidities: see PMH  are also affecting patient's functional outcome.    REHAB POTENTIAL: Good   CLINICAL DECISION MAKING: Evolving/moderate complexity   EVALUATION COMPLEXITY: Moderate     GOALS: Goals reviewed with patient? Yes   SHORT TERM GOALS: Target date: 03/03/2022   Patient donnes prosthesis modified independent & verbalizes adjustment ply socks. Baseline: SEE OBJECTIVE DATA Goal status: met 02/22/22   2.  Patient tolerates prosthesis >10 hrs total /day without increase in skin issues or no limb pain after  standing. Baseline: SEE OBJECTIVE DATA Goal status: met 02/28/22   3.  Berg Balance >36/56 Baseline: SEE OBJECTIVE DATA Goal status: met 02/28/22   4. Patient ambulates 200' with cane & prosthesis with supervision. Baseline: SEE OBJECTIVE DATA Goal status: MET   5. Patient negotiates ramps & curbs with RW & prosthesis with modified independent and with cane with minA Baseline: SEE OBJECTIVE DATA Goal status: will assess next visit.     LONG TERM GOALS: Target date: 04/28/2022   Patient demonstrates & verbalizes understanding of prosthetic care to enable safe utilization of prosthesis. Baseline: SEE OBJECTIVE DATA Goal status: INITIAL   Patient tolerates prosthesis wear >90% of awake hours without skin or limb pain issues. Baseline: SEE OBJECTIVE DATA Goal status: INITIAL   Berg Balance task >/= 46/56 to indicate lower fall risk Baseline: SEE OBJECTIVE DATA Goal status:  INITIAL   Patient safely ambulates >500' with prosthesis & cane or less independently. Baseline: SEE OBJECTIVE DATA Goal status: INITIAL   Patient negotiates ramps, curbs & stairs with single rail with prosthesis & cane or less independently. Baseline: SEE OBJECTIVE DATA Goal status: INITIAL   6.   Patient verbalizes & demonstrates work simulated tasks like lifting & carrying. Baseline: SEE OBJECTIVE DATA Goal status: INITIAL       PLAN: PT FREQUENCY: 1-2x/week   PT DURATION: 12 weeks   PLANNED INTERVENTIONS: Therapeutic exercises, Therapeutic activity, Neuromuscular re-education, Balance training, Gait training, Patient/Family education, Self Care, Stair training, Prosthetic training, DME instructions, scar mobilization, Ultrasound, Ionotophoresis 33m/ml Dexamethasone, Manual therapy, Re-evaluation, and physical performance tests   PLAN FOR NEXT SESSION: what did MD say, what did prosthetist say/adjust.  BElsie Ra PT, DPT 03/03/22 11:56 AM

## 2022-03-06 ENCOUNTER — Ambulatory Visit (INDEPENDENT_AMBULATORY_CARE_PROVIDER_SITE_OTHER): Payer: BC Managed Care – PPO | Admitting: Orthopedic Surgery

## 2022-03-06 ENCOUNTER — Encounter: Payer: Self-pay | Admitting: Orthopedic Surgery

## 2022-03-06 ENCOUNTER — Other Ambulatory Visit: Payer: Self-pay

## 2022-03-06 DIAGNOSIS — Z89511 Acquired absence of right leg below knee: Secondary | ICD-10-CM

## 2022-03-06 DIAGNOSIS — S88111A Complete traumatic amputation at level between knee and ankle, right lower leg, initial encounter: Secondary | ICD-10-CM

## 2022-03-06 NOTE — Progress Notes (Signed)
Office Visit Note   Patient: Valerie Le           Date of Birth: 11/01/1970           MRN: [id number] Visit Date: 03/06/2022              Requested by: Maud Deed, [place] [address] [place],  [place] [address] PCP: Maud Deed, [place]  Chief Complaint  Patient presents with   Right Leg - Routine Post Op    11/11/21 right BKA with Kerecis       HPI: Patient is a 51 year old woman who is 15 weeks status post right below-knee amputation.  Patient was wearing over 10 ply socks she has had her socket modified to make up the volume.  Patient is progressing well with physical therapy.  Patient was in her prosthesis at 10 weeks out from surgery.  Assessment & Plan: Visit Diagnoses:  1. Below-knee amputation of right lower extremity (HCC)     Plan: Patient will follow-up in 3 months.  Patient is provided a note to return to work on 03/09/2022.  We will obtain 2 view radiographs of the right leg at follow-up.  Patient will need a new socket about 6 months out from surgery.  Follow-Up Instructions: Return in about 3 months (around 06/05/2022).   Ortho Exam  Patient is alert, oriented, no adenopathy, well-dressed, normal affect, normal respiratory effort. Examination the residual limb is well consolidated there is no ulcers no cellulitis no dermatitis.  Patient has good range of motion of her knee.  Patient is quite pleased with her progress.  Imaging: No results found. No images are attached to the encounter.  Labs: Lab Results  Component Value Date   HGBA1C 7.4 (H) 11/11/2021   HGBA1C 13.1 (H) 12/02/2020   ESRSEDRATE 84 (H) 11/02/2021   ESRSEDRATE 72 (H) 06/15/2021   CRP 24.7 (H) 06/15/2021   REPTSTATUS 11/07/2021 FINAL 11/02/2021   GRAMSTAIN  06/29/2021    RARE WBC PRESENT, PREDOMINANTLY MONONUCLEAR NO ORGANISMS SEEN    CULT  11/02/2021    NO GROWTH 5 DAYS Performed at [facility], [address] [place], [place]  [address]    [facility] STAPHYLOCOCCUS EPIDERMIDIS 06/29/2021   LABORGA STAPHYLOCOCCUS LUGDUNENSIS 06/29/2021     Lab Results  Component Value Date   ALBUMIN 1.9 (L) 11/17/2021   ALBUMIN 2.1 (L) 11/03/2021   ALBUMIN 2.6 (L) 12/03/2020   PREALBUMIN 11.4 (L) 11/11/2021    Lab Results  Component Value Date   MG 1.9 11/11/2021   Lab Results  Component Value Date   VD25OH 33.19 11/17/2021   VD25OH 32.37 11/11/2021    Lab Results  Component Value Date   PREALBUMIN 11.4 (L) 11/11/2021      Latest Ref Rng & Units 11/21/2021    6:07 AM 11/17/2021    5:20 AM 11/13/2021    3:13 AM  CBC EXTENDED  WBC 4.0 - 10.5 K/uL 12.8  14.7  17.7   RBC 3.87 - 5.11 MIL/uL 3.38  3.25  3.59   Hemoglobin 12.0 - 15.0 g/dL 7.7  7.2  8.1   HCT 62.8 - 46.0 % 23.4  22.7  25.1   Platelets 150 - 400 K/uL 363  331  437   NEUT# 1.7 - 7.7 K/uL  9.3    Lymph# 0.7 - 4.0 K/uL  3.2       There is no height or weight on file to calculate BMI.  Orders:  No orders of the defined types were placed in this encounter.  No orders of the defined types were placed in this encounter.    Procedures: No procedures performed  Clinical Data: No additional findings.  ROS:  All other systems negative, except as noted in the HPI. Review of Systems  Objective: Vital Signs: There were no vitals taken for this visit.  Specialty Comments:  No specialty comments available.  PMFS History: Patient Active Problem List   Diagnosis Date Noted   S/P BKA (below knee amputation), right ([place]) 11/16/2021   Below-knee amputation of right lower extremity ([place]) 11/11/2021   Hypoalbuminemia 11/03/2021   Non-pressure chronic ulcer of other part of right foot limited to breakdown of skin (HCC)    Chest pain 11/02/2021   Elevated troponin 11/02/2021   SIRS (systemic inflammatory response syndrome) ([place]) 11/02/2021   Diabetes mellitus type 1 ([place]) 11/02/2021   Thrombocytosis 11/02/2021   Microcytic hypochromic anemia 11/02/2021    Hypokalemia 11/02/2021   Morbid obesity ([place]) 11/02/2021   Medication monitoring encounter 06/15/2021   Smoking 02/01/2021   Acute hematogenous osteomyelitis of right foot ([place]) 12/22/2020   Osteomyelitis of foot, right, acute ([place])    Dehiscence of amputation stump ([place])    Diabetic foot infection ([place]) 12/02/2020   Essential hypertension 12/02/2020   Hyperlipidemia 08/21/2018   Bipolar affective disorder, currently depressed, moderate ([place]) 01/23/2017   Insulin-treated type 2 diabetes mellitus ([place]) 03/07/2013   Asthma 03/07/2013   ADHD (attention deficit hyperactivity disorder) 03/07/2013   Migraine 03/07/2013   Tobacco abuse 03/07/2013   Past Medical History:  Diagnosis Date   Anemia    Asthma    as a child   Depression    Diabetes mellitus without complication (HCC)    Type 1 - Has Insulin Pump   Headache    migraines   High cholesterol    Hypertension     History reviewed. No pertinent family history.  Past Surgical History:  Procedure Laterality Date   AMPUTATION Right 12/22/2020   Procedure: RIGHT TRANSMETATARSAL AMPUTATION;  Surgeon: Newt Minion, MD;  Location: [place];  Service: Orthopedics;  Laterality: Right;   AMPUTATION Right 06/29/2021   Procedure: REVISION RIGHT TRANSMETATARSAL AMPUTATION;  Surgeon: Newt Minion, MD;  Location: [place];  Service: Orthopedics;  Laterality: Right;   AMPUTATION Right 11/11/2021   Procedure: RIGHT BELOW KNEE AMPUTATION;  Surgeon: Newt Minion, MD;  Location: [facility];  Service: Orthopedics;  Laterality: Right;   AMPUTATION TOE Right 12/04/2020   Procedure: AMPUTATION RIGHT SECOND TOE, I&D FOOT;  Surgeon: Marybelle Killings, MD;  Location: [facility];  Service: Orthopedics;  Laterality: Right;  Right second toe amputation, I&D right foot.   APPLICATION OF WOUND VAC  11/11/2021   Procedure: APPLICATION OF WOUND VAC;  Surgeon: Newt Minion, MD;  Location: [facility];  Service: Orthopedics;;   birth control implant  07/31/2006   Essure Implant    TONSILLECTOMY     TYMPANOSTOMY TUBE PLACEMENT     WISDOM TOOTH EXTRACTION     Social History   Occupational History   Not on file  Tobacco Use   Smoking status: Every Day    Packs/day: 0.25    Years: 25.00    Total pack years: 6.25    Types: Cigarettes   Smokeless tobacco: Never   Tobacco comments:    Cut down on smoking while on Chantix-  Vaping Use   Vaping Use: Never used  Substance and Sexual Activity  Alcohol use: Not Currently    Comment: Once a year on New Year's Day   Drug use: Not Currently   Sexual activity: Yes    Birth control/protection: Implant    Comment: Essure Implant

## 2022-03-07 ENCOUNTER — Ambulatory Visit (INDEPENDENT_AMBULATORY_CARE_PROVIDER_SITE_OTHER): Payer: BC Managed Care – PPO | Admitting: Physical Therapy

## 2022-03-07 ENCOUNTER — Encounter: Payer: Self-pay | Admitting: Physical Therapy

## 2022-03-07 DIAGNOSIS — L98498 Non-pressure chronic ulcer of skin of other sites with other specified severity: Secondary | ICD-10-CM | POA: Diagnosis not present

## 2022-03-07 DIAGNOSIS — R2681 Unsteadiness on feet: Secondary | ICD-10-CM

## 2022-03-07 DIAGNOSIS — R2689 Other abnormalities of gait and mobility: Secondary | ICD-10-CM | POA: Diagnosis not present

## 2022-03-07 DIAGNOSIS — R293 Abnormal posture: Secondary | ICD-10-CM

## 2022-03-07 DIAGNOSIS — M6281 Muscle weakness (generalized): Secondary | ICD-10-CM

## 2022-03-07 NOTE — Therapy (Signed)
OUTPATIENT PHYSICAL THERAPY TREATMENT NOTE   Patient Name: Valerie Le MRN: [id number] DOB:10/23/1970, 51 y.o., female Today's Date: 03/07/2022  PCP: Howley, [name], PA REFERRING PROVIDER: [name], Marcus V, MD  END OF SESSION:   PT End of Session - 03/07/22 1444     Visit Number 7    Number of Visits 25    Date for PT Re-Evaluation 04/28/22    Authorization Type [employer]    Authorization Time Period $50 co-pay    Progress Note Due on Visit 10    PT Start Time 1353    PT Stop Time 1432    PT Time Calculation (min) 39 min    Equipment Utilized During Treatment Gait belt    Activity Tolerance Patient tolerated treatment well    Behavior During Therapy WFL for tasks assessed/performed              Past Medical History:  Diagnosis Date   Anemia    Asthma    as a child   Depression    Diabetes mellitus without complication (HCC)    Type 1 - Has Insulin Pump   Headache    migraines   High cholesterol    Hypertension    Past Surgical History:  Procedure Laterality Date   AMPUTATION Right 12/22/2020   Procedure: RIGHT TRANSMETATARSAL AMPUTATION;  Surgeon: [name], Marcus V, MD;  Location: [facility];  Service: Orthopedics;  Laterality: Right;   AMPUTATION Right 06/29/2021   Procedure: REVISION RIGHT TRANSMETATARSAL AMPUTATION;  Surgeon: [name], Marcus V, MD;  Location: [facility];  Service: Orthopedics;  Laterality: Right;   AMPUTATION Right 11/11/2021   Procedure: RIGHT BELOW KNEE AMPUTATION;  Surgeon: [name], Marcus V, MD;  Location: [facility];  Service: Orthopedics;  Laterality: Right;   AMPUTATION TOE Right 12/04/2020   Procedure: AMPUTATION RIGHT SECOND TOE, I&D FOOT;  Surgeon: Yates, Mark C, MD;  Location: [facility];  Service: Orthopedics;  Laterality: Right;  Right second toe amputation, I&D right foot.   APPLICATION OF WOUND VAC  11/11/2021   Procedure: APPLICATION OF WOUND VAC;  Surgeon: [name], Marcus V, MD;  Location: [facility];  Service: Orthopedics;;   birth control implant   07/31/2006   Essure Implant   TONSILLECTOMY     TYMPANOSTOMY TUBE PLACEMENT     WISDOM TOOTH EXTRACTION     Patient Active Problem List   Diagnosis Date Noted   S/P BKA (below knee amputation), right (HCC) 11/16/2021   Below-knee amputation of right lower extremity (HCC) 11/11/2021   Hypoalbuminemia 11/03/2021   Non-pressure chronic ulcer of other part of right foot limited to breakdown of skin (HCC)    Chest pain 11/02/2021   Elevated troponin 11/02/2021   SIRS (systemic inflammatory response syndrome) (HCC) 11/02/2021   Diabetes mellitus type 1 (HCC) 11/02/2021   Thrombocytosis 11/02/2021   Microcytic hypochromic anemia 11/02/2021   Hypokalemia 11/02/2021   Morbid obesity (HCC) 11/02/2021   Medication monitoring encounter 06/15/2021   Smoking 02/01/2021   Acute hematogenous osteomyelitis of right foot (HCC) 12/22/2020   Osteomyelitis of foot, right, acute (HCC)    Dehiscence of amputation stump (HCC)    Diabetic foot infection (HCC) 12/02/2020   Essential hypertension 12/02/2020   Hyperlipidemia 08/21/2018   Bipolar affective disorder, currently depressed, moderate (HCC) 01/23/2017   Insulin-treated type 2 diabetes mellitus (HCC) 03/07/2013   Asthma 03/07/2013   ADHD (attention deficit hyperactivity disorder) 03/07/2013   Migraine 03/07/2013   Tobacco abuse 03/07/2013    REFERRING DIAG: S88.111A (ICD-10-CM) -   Below-knee amputation of right lower extremity (HCC)    ONSET DATE: 01/27/2022 prosthesis delivery  THERAPY DIAG:  Other abnormalities of gait and mobility  Unsteadiness on feet  Muscle weakness (generalized)  Non-pressure chronic ulcer of skin of other sites with other specified severity (HCC)  Abnormal posture  Rationale for Evaluation and Treatment Rehabilitation  PERTINENT HISTORY:  Rt TTA 11/11/21 due to osteomyelitis of Transmetatarsal Amputation, HTN, DM type 1, obesity, asthma as child, depression, HA migraines  PRECAUTIONS: None  SUBJECTIVE:  Prosthetist on Friday made some adjustments and added pads. She feels like her knee is pushed backwards when she walks. Dr. [name] pulled off scab and said she doesn't need to wear the underliner.  She wears the prosthesis from morning after bathroom until the afternoon, checking the liner every 5 hours with some dampness. She is going to start work on Thursday with a few days in the office before entering homes and is most concerned about starting driving. She has not tried driving yet.  PAIN:  Are you having pain? 0/10 pain upon arrival   OBJECTIVE: (objective measures completed at initial evaluation unless otherwise dated)    COGNITION: Overall cognitive status: Within functional limits for tasks assessed              SENSATION: Not tested   POSTURE: rounded shoulders, forward head, and weight shift left   LOWER EXTREMITY ROM:   ROM Right eval Left eval  Hip flexion      Hip extension      Hip abduction      Hip adduction      Hip internal rotation      Hip external rotation      Knee flexion      Knee extension      Ankle dorsiflexion      Ankle plantarflexion      Ankle inversion      Ankle eversion       (Blank rows = not tested)   LOWER EXTREMITY MMT:   MMT Right eval Left eval  Hip flexion      Hip extension      Hip abduction      Hip adduction      Hip internal rotation      Hip external rotation      Knee flexion      Knee extension      Ankle dorsiflexion      Ankle plantarflexion      Ankle inversion      Ankle eversion      (Blank rows = not tested)   TRANSFERS: 02/22/22 STS with SBA on chair without armrests, uses BUE and stabilizes without UE support  01/30/22 Sit to stand: SBA with verbal cueing for scooting forward in chair, used BUE to stand but could stabilize without UE support on RW Stand to sit: SBA, required BUE support to control descent   GAIT: 03/07/22 Pt amb in clinic without AD independently with Rt hip hike and abduction for  foot clearance, requires minA for ramp and curb negotiation  01/30/22 Gait pattern: step to pattern, decreased step length- Left, decreased stance time- Right, decreased stride length, decreased hip/knee flexion- Right, Right hip hike, knee flexed in stance- Right, antalgic, narrow BOS, and abducted- Right Distance walked: 30' Assistive device utilized: Walker - 2 wheeled Level of assistance: SBA Gait velocity: 1.10 ft/sec Comments: excessive weight bearing BUEs on RW and partial weight on prosthesis   FUNCTIONAL TESTs:   Berg   Balance Scale: 02/28/22 score 40/56    Berg Balance Scale: 01/30/22 score 25/56   [facility] PT Assessment - 01/30/22 1110                Standardized Balance Assessment    Standardized Balance Assessment Berg Balance Test          Berg Balance Test    Sit to Stand Able to stand using hands after several tries     Standing Unsupported Needs several tries to stand 30 seconds unsupported     Sitting with Back Unsupported but Feet Supported on Floor or Stool Able to sit safely and securely 2 minutes     Stand to Sit Controls descent by using hands     Transfers Able to transfer safely, definite need of hands     Standing Unsupported with Eyes Closed Unable to keep eyes closed 3 seconds but stays steady     Standing Unsupported with Feet Together Needs help to attain position but able to stand for 30 seconds with feet together     From Standing, Reach Forward with Outstretched Arm Can reach confidently >25 cm (10")     From Standing Position, Pick up Object from Floor Able to pick up shoe, needs supervision     From Standing Position, Turn to Look Behind Over each Shoulder Looks behind one side only/other side shows less weight shift     Turn 360 Degrees Needs assistance while turning     Standing Unsupported, Alternately Place Feet on Step/Stool Needs assistance to keep from falling or unable to try     Standing Unsupported, One Foot in [employer] balance while  stepping or standing     Standing on One Leg Unable to try or needs assist to prevent fall     Total Score 25     Berg comment: BERG  < 36 high risk for falls (close to 100%) 46-51 moderate (>50%)   37-45 significant (>80%) 52-55 lower (> 25%)                 CURRENT PROSTHETIC WEAR ASSESSMENT: Patient is dependent with: skin check, residual limb care, care of non-amputated limb, prosthetic cleaning, ply sock cleaning, correct ply sock adjustment, proper wear schedule/adjustment, and proper weight-bearing schedule/adjustment Donning prosthesis: Mod A and requires instructions for fit, donning Doffing prosthesis: SBA Prosthetic wear tolerance: 2 hours/day for 2 days since delivery (Saturday, Sunday) Prosthetic weight bearing tolerance: up to ~8 minutes with intermittent UE support with no complaints of residual limb pain or issues Edema: present Residual limb condition: one scab 30m on lateral incision, one open wound superficial 640mdistal tibia, limb is cylindrical with normal temperature and color Prosthetic description: silicone liner with pin lock suspension, total contact socket, dynamic response foot     TODAY'S TREATMENT:  03/07/2022 Prosthetic Training with Transtibial Amputation prosthesis: Wound on residual limb is fully healed with scab removed. PT demonstrated donning of prosthesis with tactile cues for pin alignment to pull soft tissue towards distal tibia, pt verbalized understanding PT educated on signs of sweating and antiperspirant application for sweat management, pt verbalized understanding PT educated with review on options for driving with pt verbalized understanding and returned demo with simulation Pt amb 215' SBA divided attention with naming animals A-Z and scannng items in clinic working on gait with distraction Pt ascend and descend 11 step staircase x 2 with SBA  -Ascend: pt shows more stability with BUE handrail able to alternate pattern, can ascend  with  single handrail (5 steps ea. UE) with step-to pattern after cueing for technique  -Descend: pt can descend with alternating pattern with BUE handrail support and cueing for hand placement, can descend with single handrail (5 steps ea. UE) with step-to pattern and with decreased eccentric control Pt ascend and descend ramp and curb with minA to CGA without AD, verbal cueing for technique and momentum PT advised bringing cane to work as environment may require curb, ramp, or stairs with varying handrail support and she currently requires assistance with those tasks and pt verbalized understanding and agreement   03/03/2022 Prosthetic Training with Transtibial Amputation prosthesis: Doffed and donned prosthesis to examine Scab on lateral incision (closed and no signs of infection) and to trouble shoot prosthetic fit. She was wearing 9 ply sock and still appeared to be sitting to low in prosthesis. Step ups fwd in bars 6 inch step with bilat UE support X 5 on Rt leg (for strengthening) then X 10 stepping up with left and down with Rt in front to simulate how she would properly perform stairs. Lateral step ups 6 inch step X 5 bilat with bilat UE support Dynamic balance walking in bars with head turns  X3 and head nods X 3 with intermittent UE support PRN Dynamic gait stepping over foam beam X 3 leading each way Balance on foam beam 1 min intemittent UE suppprt PRN Sit to stand standard chair using arm rests to ascend then no UE support to slowly descend with cues to avoid plop.   02/28/2022 Prosthetic Training with Transtibial Amputation prosthesis: Scab on lateral incision measuring 25m by 411m No signs of infection PT wear time recommendations progressed to all day with 2 hour break in middle of day and changing under liner midpoint both wears due to sweat & switching liners at bathing to enable to clean without being late at night. Pt verbalized understanding PT advised driving recommendations with  practicing in empty parking lot, pt verbalized understanding and received printed instructions (see pt instructions for details)  Physical performance tests: BeMerrilee Janskyesting as noted above (improved to 40/56 from 25/56)   02/21/2022 Prosthetic Training with Transtibial Amputation prosthesis Residual limb is looking well with scab on lateral incision measuring [id number] at the widest part PT educated on purpose of building wear time with recommended wear during illness and with consistent wear times of 5 hours 2x/day with 2 hour break, pt verbalized understanding PT educated on signs of sweating and drying liner/limb, pt verbalized understanding PT demo sit to/from stand in chair without armrests with visual and verbal explanation. Pt returned demo in chair with armrests x 2, chair without armrests x 5 with SBA and verbal cueing, pt showed increased confidence with secure table in front of chair. PT suggested adding to HEP 2 x 5 reps, 3x day at dining room or kitchen table, pt verbalized understanding. Pt partial squat in chair without armrests to practice anterior and posterior scooting x 2 ea. SBA and verbal cueing for technique Pt picked up cloth from floor x 6 between two chairs with SBA, required verbal cueing for foot placement     HOME EXERCISE PROGRAM:   02/15/22 1214  PT Education  Education Details HEP at sink  Person(s) Educated Patient  Methods Explanation;Demonstration;Tactile cues;Verbal cues;Handout  Comprehension Verbalized understanding;Returned demonstration;Verbal cues required;Tactile cues required    Do each exercise 1-2  times per day Do each exercise 5-10 repetitions Hold each exercise for 2 seconds to feel your location  AT SINK FIND YOUR MIDLINE POSITION AND PLACE FEET EQUAL DISTANCE FROM THE MIDLINE.  Try to find this position when standing still for activities.   USE TAPE ON FLOOR TO MARK THE MIDLINE POSITION which is even with middle of sink.  You also should  try to feel with your limb pressure in socket.  You are trying to feel with limb what you used to feel with the bottom of your foot.  Side to Side Shift: Moving your hips only (not shoulders): move weight onto your left leg, HOLD/FEEL pressure in socket.  Move back to equal weight on each leg, HOLD/FEEL pressure in socket. Move weight onto your right leg, HOLD/FEEL pressure in socket. Move back to equal weight on each leg, HOLD/FEEL pressure in socket. Repeat.  Start with both hands on sink, progress to hand on prosthetic side only, then no hands.  Front to Back Shift: Moving your hips only (not shoulders): move your weight forward onto your toes, HOLD/FEEL pressure in socket. Move your weight back to equal Flat Foot on both legs, HOLD/FEEL  pressure in socket. Move your weight back onto your heels, HOLD/FEEL  pressure in socket. Move your weight back to equal on both legs, HOLD/FEEL  pressure in socket. Repeat.  Start with both hands on sink, progress to hand on prosthetic side only, then no hands.  Moving Cones / Cups: With equal weight on each leg: Hold on with one hand the first time, then progress to no hand supports. Move cups from one side of sink to the other. Place cups ~2" out of your reach, progress to 10" beyond reach.  Place one hand in middle of sink and reach with other hand. Do both arms.  Then hover one hand and move cups with other hand.  Overhead/Upward Reaching: alternated reaching up to top cabinets or ceiling if no cabinets present. Keep equal weight on each leg. Start with one hand support on counter while other hand reaches and progress to no hand support with reaching.  ace one hand in middle of sink and reach with other hand. Do both arms.  Then hover one hand and move cups with other hand.  5.   Looking Over Shoulders: With equal weight on each leg: alternate turning to look over your shoulders with one hand support on counter as needed.  Start with head motions only to look in front  of shoulder, then even with shoulder and progress to looking behind you. To look to side, move head /eyes, then shoulder on side looking pulls back, shift more weight to side looking and pull hip back. Place one hand in middle of sink and let go with other hand so your shoulder can pull back. Switch hands to look other way.   Then hover one hand and look over shoulder. If looking right, use left hand at sink. If looking left, use right hand at sink. 6.  Stepping with leg that is not amputated:  Move items under cabinet out of your way. Shift your hips/pelvis so weight on prosthesis. Tighten muscles in hip on prosthetic side.  SLOWLY step other leg so front of foot is in cabinet. Then step back to floor.     ASSESSMENT:   CLINICAL IMPRESSION: She has met all STGs and is progressing well. She anticipates returning to work this week so treatment today included education on driving options, gait with distraction, and ramp/curb/stair negotiation. Based on assistance required for ramp and curb and the need for handrail   support with stairs, she was advised to continue using her cane upon return to work due to the variability in her work environments. She continues to benefit from skilled PT.   OBJECTIVE IMPAIRMENTS Abnormal gait, decreased activity tolerance, decreased balance, decreased endurance, decreased knowledge of use of DME, decreased mobility, difficulty walking, decreased strength, postural dysfunction, and prosthetic dependency .    ACTIVITY LIMITATIONS carrying, lifting, bending, sitting, standing, squatting, stairs, transfers, locomotion level, and caring for others   PARTICIPATION LIMITATIONS: meal prep, cleaning, laundry, driving, community activity, and occupation   [place], Time since onset of injury/illness/exacerbation, and 3+ comorbidities: see PMH  are also affecting patient's functional outcome.    REHAB POTENTIAL: Good   CLINICAL DECISION MAKING: Evolving/moderate  complexity   EVALUATION COMPLEXITY: Moderate     GOALS: Goals reviewed with patient? Yes   SHORT TERM GOALS: Target date: 03/03/2022   Patient donnes prosthesis modified independent & verbalizes adjustment ply socks. Baseline: SEE OBJECTIVE DATA Goal status: met 02/22/22   2.  Patient tolerates prosthesis >10 hrs total /day without increase in skin issues or no limb pain after standing. Baseline: SEE OBJECTIVE DATA Goal status: met 02/28/22   3.  Berg Balance >36/56 Baseline: SEE OBJECTIVE DATA Goal status: met 02/28/22   4. Patient ambulates 200' with cane & prosthesis with supervision. Baseline: SEE OBJECTIVE DATA Goal status: MET   5. Patient negotiates ramps & curbs with RW & prosthesis with modified independent and with cane with minA Baseline: SEE OBJECTIVE DATA Goal status: met 03/07/22  UPDATED SHORT TERM GOALS: Target date: 03/31/22  1. Patient ambulates 200' with prosthesis modified independent. Baseline: SEE OBJECTIVE DATA Goal status: new 03/07/22   2. Patient negotiates ramps & curbs with cane & prosthesis with modified independent Baseline: SEE OBJECTIVE DATA Goal status: new 03/07/22  3. Berg Balance task >/= 43/56 to indicate lower fall risk Baseline: SEE OBJECTIVE DATA Goal status: new 03/07/22   LONG TERM GOALS: Target date: 04/28/2022   Patient demonstrates & verbalizes understanding of prosthetic care to enable safe utilization of prosthesis. Baseline: SEE OBJECTIVE DATA Goal status: INITIAL   Patient tolerates prosthesis wear >90% of awake hours without skin or limb pain issues. Baseline: SEE OBJECTIVE DATA Goal status: INITIAL   Berg Balance task >/= 46/56 to indicate lower fall risk Baseline: SEE OBJECTIVE DATA Goal status: INITIAL   Patient safely ambulates >500' with prosthesis & cane or less independently. Baseline: SEE OBJECTIVE DATA Goal status: INITIAL   Patient negotiates ramps, curbs & stairs with single rail with prosthesis & cane  or less independently. Baseline: SEE OBJECTIVE DATA Goal status: INITIAL   6.   Patient verbalizes & demonstrates work simulated tasks like lifting & carrying. Baseline: SEE OBJECTIVE DATA Goal status: INITIAL       PLAN: PT FREQUENCY: 1-2x/week   PT DURATION: 12 weeks   PLANNED INTERVENTIONS: Therapeutic exercises, Therapeutic activity, Neuromuscular re-education, Balance training, Gait training, Patient/Family education, Self Care, Stair training, Prosthetic training, DME instructions, scar mobilization, Ultrasound, Ionotophoresis 31m/ml Dexamethasone, Manual therapy, Re-evaluation, and physical performance tests   PLAN FOR NEXT SESSION: follow up with return to work. gait with threshold/obstacle negotiation, head turns, side stepping   [employer] Student-PT 03/07/2022, 4:33 PM  This entire session of physical therapy was performed under the direct supervision of PT signing evaluation /treatment. PT reviewed note and agrees.  RJamey Reas PT, DPT 03/07/22 4:33 PM

## 2022-03-14 ENCOUNTER — Encounter: Payer: Self-pay | Admitting: Physical Therapy

## 2022-03-14 ENCOUNTER — Ambulatory Visit (INDEPENDENT_AMBULATORY_CARE_PROVIDER_SITE_OTHER): Payer: BC Managed Care – PPO | Admitting: Physical Therapy

## 2022-03-14 ENCOUNTER — Encounter: Payer: Medicaid Other | Admitting: Orthopedic Surgery

## 2022-03-14 DIAGNOSIS — R2689 Other abnormalities of gait and mobility: Secondary | ICD-10-CM | POA: Diagnosis not present

## 2022-03-14 DIAGNOSIS — L98498 Non-pressure chronic ulcer of skin of other sites with other specified severity: Secondary | ICD-10-CM

## 2022-03-14 DIAGNOSIS — M6281 Muscle weakness (generalized): Secondary | ICD-10-CM

## 2022-03-14 DIAGNOSIS — R2681 Unsteadiness on feet: Secondary | ICD-10-CM

## 2022-03-14 NOTE — Therapy (Signed)
OUTPATIENT PHYSICAL THERAPY TREATMENT NOTE   Patient Name: Valerie Le MRN: [id number] DOB:1970-06-30, 51 y.o., female Today's Date: 03/14/2022  PCP: Maud Deed, PA REFERRING PROVIDER: [name], MD  END OF SESSION:   PT End of Session - 03/14/22 1108     Visit Number 8    Number of Visits 25    Date for PT Re-Evaluation 04/28/22    Authorization Type [employer]    Authorization Time Period $50 co-pay    Progress Note Due on Visit 10    PT Start Time 1100    PT Stop Time 1145    PT Time Calculation (min) 45 min    Equipment Utilized During Treatment Gait belt    Activity Tolerance Patient tolerated treatment well    Behavior During Therapy WFL for tasks assessed/performed               Past Medical History:  Diagnosis Date   Anemia    Asthma    as a child   Depression    Diabetes mellitus without complication (HCC)    Type 1 - Has Insulin Pump   Headache    migraines   High cholesterol    Hypertension    Past Surgical History:  Procedure Laterality Date   AMPUTATION Right 12/22/2020   Procedure: RIGHT TRANSMETATARSAL AMPUTATION;  Surgeon: [name], MD;  Location: [facility] OR;  Service: Orthopedics;  Laterality: Right;   AMPUTATION Right 06/29/2021   Procedure: REVISION RIGHT TRANSMETATARSAL AMPUTATION;  Surgeon: [name], MD;  Location: [facility] OR;  Service: Orthopedics;  Laterality: Right;   AMPUTATION Right 11/11/2021   Procedure: RIGHT BELOW KNEE AMPUTATION;  Surgeon: [name], MD;  Location: [facility] OR;  Service: Orthopedics;  Laterality: Right;   AMPUTATION TOE Right 12/04/2020   Procedure: AMPUTATION RIGHT SECOND TOE, I&D FOOT;  Surgeon: Eldred Manges, MD;  Location: [facility] ORS;  Service: Orthopedics;  Laterality: Right;  Right second toe amputation, I&D right foot.   APPLICATION OF WOUND VAC  11/11/2021   Procedure: APPLICATION OF WOUND VAC;  Surgeon: [name], MD;  Location: [facility] OR;  Service: Orthopedics;;   birth control implant   07/31/2006   Essure Implant   TONSILLECTOMY     TYMPANOSTOMY TUBE PLACEMENT     WISDOM TOOTH EXTRACTION     Patient Active Problem List   Diagnosis Date Noted   S/P BKA (below knee amputation), right (HCC) 11/16/2021   Below-knee amputation of right lower extremity (HCC) 11/11/2021   Hypoalbuminemia 11/03/2021   Non-pressure chronic ulcer of other part of right foot limited to breakdown of skin (HCC)    Chest pain 11/02/2021   Elevated troponin 11/02/2021   SIRS (systemic inflammatory response syndrome) (HCC) 11/02/2021   Diabetes mellitus type 1 (HCC) 11/02/2021   Thrombocytosis 11/02/2021   Microcytic hypochromic anemia 11/02/2021   Hypokalemia 11/02/2021   Morbid obesity (HCC) 11/02/2021   Medication monitoring encounter 06/15/2021   Smoking 02/01/2021   Acute hematogenous osteomyelitis of right foot (HCC) 12/22/2020   Osteomyelitis of foot, right, acute (HCC)    Dehiscence of amputation stump (HCC)    Diabetic foot infection (HCC) 12/02/2020   Essential hypertension 12/02/2020   Hyperlipidemia 08/21/2018   Bipolar affective disorder, currently depressed, moderate (HCC) 01/23/2017   Insulin-treated type 2 diabetes mellitus (HCC) 03/07/2013   Asthma 03/07/2013   ADHD (attention deficit hyperactivity disorder) 03/07/2013   Migraine 03/07/2013   Tobacco abuse 03/07/2013    REFERRING DIAG: O96.295M (ICD-10-CM) -  Below-knee amputation of right lower extremity (HCC)    ONSET DATE: 01/27/2022 prosthesis delivery  THERAPY DIAG:  Other abnormalities of gait and mobility  Unsteadiness on feet  Muscle weakness (generalized)  Non-pressure chronic ulcer of skin of other sites with other specified severity (HCC)  Rationale for Evaluation and Treatment Rehabilitation  PERTINENT HISTORY:  Rt TTA 11/11/21 due to osteomyelitis of Transmetatarsal Amputation, HTN, DM type 1, obesity, asthma as child, depression, HA migraines  PRECAUTIONS: None  SUBJECTIVE: She has returned to  work as SW. She was not in office yesterday & today.  She found gravel difficult.  She is wearing prosthesis all awake hours without issues.   PAIN:  Are you having pain?  0/10 pain upon arrival   OBJECTIVE: (objective measures completed at initial evaluation unless otherwise dated)    COGNITION: Overall cognitive status: Within functional limits for tasks assessed              SENSATION: Not tested   POSTURE: rounded shoulders, forward head, and weight shift left   LOWER EXTREMITY ROM:   ROM Right eval Left eval  Hip flexion      Hip extension      Hip abduction      Hip adduction      Hip internal rotation      Hip external rotation      Knee flexion      Knee extension      Ankle dorsiflexion      Ankle plantarflexion      Ankle inversion      Ankle eversion       (Blank rows = not tested)   LOWER EXTREMITY MMT:   MMT Right eval Left eval  Hip flexion      Hip extension      Hip abduction      Hip adduction      Hip internal rotation      Hip external rotation      Knee flexion      Knee extension      Ankle dorsiflexion      Ankle plantarflexion      Ankle inversion      Ankle eversion      (Blank rows = not tested)   TRANSFERS: 02/22/22 STS with SBA on chair without armrests, uses BUE and stabilizes without UE support  01/30/22 Sit to stand: SBA with verbal cueing for scooting forward in chair, used BUE to stand but could stabilize without UE support on RW Stand to sit: SBA, required BUE support to control descent   GAIT: 03/07/22 Pt amb in clinic without AD independently with Rt hip hike and abduction for foot clearance, requires minA for ramp and curb negotiation  01/30/22 Gait pattern: step to pattern, decreased step length- Left, decreased stance time- Right, decreased stride length, decreased hip/knee flexion- Right, Right hip hike, knee flexed in stance- Right, antalgic, narrow BOS, and abducted- Right Distance walked: 30' Assistive device  utilized: Environmental consultantWalker - 2 wheeled Level of assistance: SBA Gait velocity: 1.10 ft/sec Comments: excessive weight bearing BUEs on RW and partial weight on prosthesis   FUNCTIONAL TESTs:   Berg Balance Scale: 02/28/22 score 40/56    Berg Balance Scale: 01/30/22 score 25/56   [facility] - 01/30/22 1110                Standardized Balance Assessment    Standardized Balance Assessment Berg Balance Test  Berg Balance Test    Sit to Stand Able to stand using hands after several tries     Standing Unsupported Needs several tries to stand 30 seconds unsupported     Sitting with Back Unsupported but Feet Supported on Floor or Stool Able to sit safely and securely 2 minutes     Stand to Sit Controls descent by using hands     Transfers Able to transfer safely, definite need of hands     Standing Unsupported with Eyes Closed Unable to keep eyes closed 3 seconds but stays steady     Standing Unsupported with Feet Together Needs help to attain position but able to stand for 30 seconds with feet together     From Standing, Reach Forward with Outstretched Arm Can reach confidently >25 cm (10")     From Standing Position, Pick up Object from Floor Able to pick up shoe, needs supervision     From Standing Position, Turn to Look Behind Over each Shoulder Looks behind one side only/other side shows less weight shift     Turn 360 Degrees Needs assistance while turning     Standing Unsupported, Alternately Place Feet on Step/Stool Needs assistance to keep from falling or unable to try     Standing Unsupported, One Foot in [employer] balance while stepping or standing     Standing on One Leg Unable to try or needs assist to prevent fall     Total Score 25     Berg comment: BERG  < 36 high risk for falls (close to 100%) 46-51 moderate (>50%)   37-45 significant (>80%) 52-55 lower (> 25%)                 CURRENT PROSTHETIC WEAR ASSESSMENT: Patient is dependent with: skin check, residual  limb care, care of non-amputated limb, prosthetic cleaning, ply sock cleaning, correct ply sock adjustment, proper wear schedule/adjustment, and proper weight-bearing schedule/adjustment Donning prosthesis: Mod A and requires instructions for fit, donning Doffing prosthesis: SBA Prosthetic wear tolerance: 2 hours/day for 2 days since delivery (Saturday, Sunday) Prosthetic weight bearing tolerance: up to ~8 minutes with intermittent UE support with no complaints of residual limb pain or issues Edema: present Residual limb condition: one scab 36mm on lateral incision, one open wound superficial 73mm distal tibia, limb is cylindrical with normal temperature and color Prosthetic description: silicone liner with pin lock suspension, total contact socket, dynamic response foot     TODAY'S TREATMENT:  03/14/2022 Prosthetic Training with Transtibial Amputation prosthesis: Wound on residual limb has dry 16mm scab with no signs of irritation or infection.  She reports "air" at times in prosthesis.  PT advised to dry limb & liner when toileting. PT reviewed use of antiperspirant daily & introduced Sweat Block wipes weekly.  Pt verbalized understanding.  Work on walking on uneven terrain with foam balance beam:  4 reps with narrow stance, 4 reps tandem, 4 reps sidestepping.  Step up & step down using prosthesis BUE support //bars leading up & down with BLEs 5 reps on 6" step & progressed to BOSU round side up for compliant surface using momentum 5 reps.  PT demo & verbal cues on technique.  Lateral step up on BOSU round side up with stance cross over ant & post 5 reps with LLE & 5 reps with RLE. PT demo & verbal cues on technique Stairs with 2 rails alternating pattern with supervision / PT demo & verbal cues on technique.   03/07/2022  Prosthetic Training with Transtibial Amputation prosthesis: Wound on residual limb is fully healed with scab removed. PT demonstrated donning of prosthesis with tactile cues  for pin alignment to pull soft tissue towards distal tibia, pt verbalized understanding PT educated on signs of sweating and antiperspirant application for sweat management, pt verbalized understanding PT educated with review on options for driving with pt verbalized understanding and returned demo with simulation Pt amb 215' SBA divided attention with naming animals A-Z and scannng items in clinic working on gait with distraction Pt ascend and descend 11 step staircase x 2 with SBA  -Ascend: pt shows more stability with BUE handrail able to alternate pattern, can ascend with single handrail (5 steps ea. UE) with step-to pattern after cueing for technique  -Descend: pt can descend with alternating pattern with BUE handrail support and cueing for hand placement, can descend with single handrail (5 steps ea. UE) with step-to pattern and with decreased eccentric control Pt ascend and descend ramp and curb with minA to CGA without AD, verbal cueing for technique and momentum PT advised bringing cane to work as environment may require curb, ramp, or stairs with varying handrail support and she currently requires assistance with those tasks and pt verbalized understanding and agreement   03/03/2022 Prosthetic Training with Transtibial Amputation prosthesis: Doffed and donned prosthesis to examine Scab on lateral incision (closed and no signs of infection) and to trouble shoot prosthetic fit. She was wearing 9 ply sock and still appeared to be sitting to low in prosthesis. Step ups fwd in bars 6 inch step with bilat UE support X 5 on Rt leg (for strengthening) then X 10 stepping up with left and down with Rt in front to simulate how she would properly perform stairs. Lateral step ups 6 inch step X 5 bilat with bilat UE support Dynamic balance walking in bars with head turns  X3 and head nods X 3 with intermittent UE support PRN Dynamic gait stepping over foam beam X 3 leading each way Balance on foam beam  1 min intemittent UE suppprt PRN Sit to stand standard chair using arm rests to ascend then no UE support to slowly descend with cues to avoid plop.   02/28/2022 Prosthetic Training with Transtibial Amputation prosthesis: Scab on lateral incision measuring 64mm by 63mm. No signs of infection PT wear time recommendations progressed to all day with 2 hour break in middle of day and changing under liner midpoint both wears due to sweat & switching liners at bathing to enable to clean without being late at night. Pt verbalized understanding PT advised driving recommendations with practicing in empty parking lot, pt verbalized understanding and received printed instructions (see pt instructions for details)  Physical performance tests: Merrilee Jansky testing as noted above (improved to 40/56 from 25/56)   02/21/2022 Prosthetic Training with Transtibial Amputation prosthesis Residual limb is looking well with scab on lateral incision measuring 11mm across at the widest part PT educated on purpose of building wear time with recommended wear during illness and with consistent wear times of 5 hours 2x/day with 2 hour break, pt verbalized understanding PT educated on signs of sweating and drying liner/limb, pt verbalized understanding PT demo sit to/from stand in chair without armrests with visual and verbal explanation. Pt returned demo in chair with armrests x 2, chair without armrests x 5 with SBA and verbal cueing, pt showed increased confidence with secure table in front of chair. PT suggested adding to HEP 2 x 5 reps, 3x  day at dining room or kitchen table, pt verbalized understanding. Pt partial squat in chair without armrests to practice anterior and posterior scooting x 2 ea. SBA and verbal cueing for technique Pt picked up cloth from floor x 6 between two chairs with SBA, required verbal cueing for foot placement     HOME EXERCISE PROGRAM:   02/15/22 1214  PT Education  Education Details HEP at sink   Person(s) Educated Patient  Methods Explanation;Demonstration;Tactile cues;Verbal cues;Handout  Comprehension Verbalized understanding;Returned demonstration;Verbal cues required;Tactile cues required    Do each exercise 1-2  times per day Do each exercise 5-10 repetitions Hold each exercise for 2 seconds to feel your location  AT SINK FIND YOUR MIDLINE POSITION AND PLACE FEET EQUAL DISTANCE FROM THE MIDLINE.  Try to find this position when standing still for activities.   USE TAPE ON FLOOR TO MARK THE MIDLINE POSITION which is even with middle of sink.  You also should try to feel with your limb pressure in socket.  You are trying to feel with limb what you used to feel with the bottom of your foot.  Side to Side Shift: Moving your hips only (not shoulders): move weight onto your left leg, HOLD/FEEL pressure in socket.  Move back to equal weight on each leg, HOLD/FEEL pressure in socket. Move weight onto your right leg, HOLD/FEEL pressure in socket. Move back to equal weight on each leg, HOLD/FEEL pressure in socket. Repeat.  Start with both hands on sink, progress to hand on prosthetic side only, then no hands.  Front to Back Shift: Moving your hips only (not shoulders): move your weight forward onto your toes, HOLD/FEEL pressure in socket. Move your weight back to equal Flat Foot on both legs, HOLD/FEEL  pressure in socket. Move your weight back onto your heels, HOLD/FEEL  pressure in socket. Move your weight back to equal on both legs, HOLD/FEEL  pressure in socket. Repeat.  Start with both hands on sink, progress to hand on prosthetic side only, then no hands.  Moving Cones / Cups: With equal weight on each leg: Hold on with one hand the first time, then progress to no hand supports. Move cups from one side of sink to the other. Place cups ~2" out of your reach, progress to 10" beyond reach.  Place one hand in middle of sink and reach with other hand. Do both arms.  Then hover one hand and  move cups with other hand.  Overhead/Upward Reaching: alternated reaching up to top cabinets or ceiling if no cabinets present. Keep equal weight on each leg. Start with one hand support on counter while other hand reaches and progress to no hand support with reaching.  ace one hand in middle of sink and reach with other hand. Do both arms.  Then hover one hand and move cups with other hand.  5.   Looking Over Shoulders: With equal weight on each leg: alternate turning to look over your shoulders with one hand support on counter as needed.  Start with head motions only to look in front of shoulder, then even with shoulder and progress to looking behind you. To look to side, move head /eyes, then shoulder on side looking pulls back, shift more weight to side looking and pull hip back. Place one hand in middle of sink and let go with other hand so your shoulder can pull back. Switch hands to look other way.   Then hover one hand and look over shoulder. If  looking right, use left hand at sink. If looking left, use right hand at sink. 6.  Stepping with leg that is not amputated:  Move items under cabinet out of your way. Shift your hips/pelvis so weight on prosthesis. Tighten muscles in hip on prosthetic side.  SLOWLY step other leg so front of foot is in cabinet. Then step back to floor.     ASSESSMENT:   CLINICAL IMPRESSION: PT session focused on activities for balance reactions with compliant surfaces and controlling raising / lowering body weight with functional activities.  She improved with PT instruction & repetition.     OBJECTIVE IMPAIRMENTS Abnormal gait, decreased activity tolerance, decreased balance, decreased endurance, decreased knowledge of use of DME, decreased mobility, difficulty walking, decreased strength, postural dysfunction, and prosthetic dependency .    ACTIVITY LIMITATIONS carrying, lifting, bending, sitting, standing, squatting, stairs, transfers, locomotion level, and caring for  others   PARTICIPATION LIMITATIONS: meal prep, cleaning, laundry, driving, community activity, and occupation   PERSONAL FACTORS Fitness, Time since onset of injury/illness/exacerbation, and 3+ comorbidities: see PMH  are also affecting patient's functional outcome.    REHAB POTENTIAL: Good   CLINICAL DECISION MAKING: Evolving/moderate complexity   EVALUATION COMPLEXITY: Moderate     GOALS: Goals reviewed with patient? Yes  UPDATED SHORT TERM GOALS: Target date: 03/31/22  1. Patient ambulates 200' with prosthesis modified independent. Baseline: SEE OBJECTIVE DATA Goal status: ongoing 03/14/2022   2. Patient negotiates ramps & curbs with cane & prosthesis with modified independent Baseline: SEE OBJECTIVE DATA Goal status: ongoing 03/14/2022  3. Berg Balance task >/= 43/56 to indicate lower fall risk Baseline: SEE OBJECTIVE DATA Goal status: ongoing 03/14/2022   LONG TERM GOALS: Target date: 04/28/2022   Patient demonstrates & verbalizes understanding of prosthetic care to enable safe utilization of prosthesis. Baseline: SEE OBJECTIVE DATA Goal status: ongoing 03/14/2022   Patient tolerates prosthesis wear >90% of awake hours without skin or limb pain issues. Baseline: SEE OBJECTIVE DATA Goal status: ongoing 03/14/2022   Berg Balance task >/= 46/56 to indicate lower fall risk Baseline: SEE OBJECTIVE DATA Goal status: ongoing 03/14/2022   Patient safely ambulates >500' with prosthesis & cane or less independently. Baseline: SEE OBJECTIVE DATA Goal status: ongoing 03/14/2022   Patient negotiates ramps, curbs & stairs with single rail with prosthesis & cane or less independently. Baseline: SEE OBJECTIVE DATA Goal status: ongoing 03/14/2022   6.   Patient verbalizes & demonstrates work simulated tasks like lifting & carrying. Baseline: SEE OBJECTIVE DATA Goal status: ongoing 03/14/2022       PLAN: PT FREQUENCY: 1-2x/week   PT DURATION: 12 weeks   PLANNED INTERVENTIONS:  Therapeutic exercises, Therapeutic activity, Neuromuscular re-education, Balance training, Gait training, Patient/Family education, Self Care, Stair training, Prosthetic training, DME instructions, scar mobilization, Ultrasound, Ionotophoresis /ml Dexamethasone, Manual therapy, Re-evaluation, and physical performance tests   PLAN FOR NEXT SESSION: reduce frequency to 1x/wk,   progress functional activities towards STGs & LTGs   Vladimir Faster, PT, DPT 03/14/2022, 12:35 PM

## 2022-03-16 ENCOUNTER — Encounter: Payer: BC Managed Care – PPO | Admitting: Physical Therapy

## 2022-03-21 ENCOUNTER — Encounter: Payer: BC Managed Care – PPO | Admitting: Physical Therapy

## 2022-03-23 ENCOUNTER — Encounter: Payer: BC Managed Care – PPO | Admitting: Physical Therapy

## 2022-03-28 ENCOUNTER — Ambulatory Visit (INDEPENDENT_AMBULATORY_CARE_PROVIDER_SITE_OTHER): Payer: BC Managed Care – PPO | Admitting: Physical Therapy

## 2022-03-28 ENCOUNTER — Encounter: Payer: Self-pay | Admitting: Physical Therapy

## 2022-03-28 DIAGNOSIS — R2689 Other abnormalities of gait and mobility: Secondary | ICD-10-CM | POA: Diagnosis not present

## 2022-03-28 DIAGNOSIS — R2681 Unsteadiness on feet: Secondary | ICD-10-CM

## 2022-03-28 DIAGNOSIS — L98498 Non-pressure chronic ulcer of skin of other sites with other specified severity: Secondary | ICD-10-CM | POA: Diagnosis not present

## 2022-03-28 DIAGNOSIS — M6281 Muscle weakness (generalized): Secondary | ICD-10-CM | POA: Diagnosis not present

## 2022-03-28 NOTE — Therapy (Signed)
OUTPATIENT PHYSICAL THERAPY TREATMENT NOTE   Patient Name: Valerie Le MRN: [id number] DOB:Oct 17, 1970, 51 y.o., female Today's Date: 03/28/2022  PCP: Maud Deed, PA REFERRING PROVIDER: [name], MD  END OF SESSION:   PT End of Session - 03/28/22 1047     Visit Number 9    Number of Visits 25    Date for PT Re-Evaluation 04/28/22    Authorization Type [employer]    Authorization Time Period $50 co-pay    Progress Note Due on Visit 10    PT Start Time 1048    PT Stop Time 1130    PT Time Calculation (min) 42 min    Equipment Utilized During Treatment Gait belt    Activity Tolerance Patient tolerated treatment well    Behavior During Therapy WFL for tasks assessed/performed               Past Medical History:  Diagnosis Date   Anemia    Asthma    as a child   Depression    Diabetes mellitus without complication (HCC)    Type 1 - Has Insulin Pump   Headache    migraines   High cholesterol    Hypertension    Past Surgical History:  Procedure Laterality Date   AMPUTATION Right 12/22/2020   Procedure: RIGHT TRANSMETATARSAL AMPUTATION;  Surgeon: [name], MD;  Location: [facility] OR;  Service: Orthopedics;  Laterality: Right;   AMPUTATION Right 06/29/2021   Procedure: REVISION RIGHT TRANSMETATARSAL AMPUTATION;  Surgeon: [name], MD;  Location: [facility] OR;  Service: Orthopedics;  Laterality: Right;   AMPUTATION Right 11/11/2021   Procedure: RIGHT BELOW KNEE AMPUTATION;  Surgeon: [name], MD;  Location: [facility] OR;  Service: Orthopedics;  Laterality: Right;   AMPUTATION TOE Right 12/04/2020   Procedure: AMPUTATION RIGHT SECOND TOE, I&D FOOT;  Surgeon: Eldred Manges, MD;  Location: [facility] ORS;  Service: Orthopedics;  Laterality: Right;  Right second toe amputation, I&D right foot.   APPLICATION OF WOUND VAC  11/11/2021   Procedure: APPLICATION OF WOUND VAC;  Surgeon: [name], MD;  Location: [facility] OR;  Service: Orthopedics;;   birth control implant   07/31/2006   Essure Implant   TONSILLECTOMY     TYMPANOSTOMY TUBE PLACEMENT     WISDOM TOOTH EXTRACTION     Patient Active Problem List   Diagnosis Date Noted   S/P BKA (below knee amputation), right (HCC) 11/16/2021   Below-knee amputation of right lower extremity (HCC) 11/11/2021   Hypoalbuminemia 11/03/2021   Non-pressure chronic ulcer of other part of right foot limited to breakdown of skin (HCC)    Chest pain 11/02/2021   Elevated troponin 11/02/2021   SIRS (systemic inflammatory response syndrome) (HCC) 11/02/2021   Diabetes mellitus type 1 (HCC) 11/02/2021   Thrombocytosis 11/02/2021   Microcytic hypochromic anemia 11/02/2021   Hypokalemia 11/02/2021   Morbid obesity (HCC) 11/02/2021   Medication monitoring encounter 06/15/2021   Smoking 02/01/2021   Acute hematogenous osteomyelitis of right foot (HCC) 12/22/2020   Osteomyelitis of foot, right, acute (HCC)    Dehiscence of amputation stump (HCC)    Diabetic foot infection (HCC) 12/02/2020   Essential hypertension 12/02/2020   Hyperlipidemia 08/21/2018   Bipolar affective disorder, currently depressed, moderate (HCC) 01/23/2017   Insulin-treated type 2 diabetes mellitus (HCC) 03/07/2013   Asthma 03/07/2013   ADHD (attention deficit hyperactivity disorder) 03/07/2013   Migraine 03/07/2013   Tobacco abuse 03/07/2013    REFERRING DIAG: S17.793J (ICD-10-CM) -  Below-knee amputation of right lower extremity (HCC)    ONSET DATE: 01/27/2022 prosthesis delivery  THERAPY DIAG:  Other abnormalities of gait and mobility  Unsteadiness on feet  Muscle weakness (generalized)  Non-pressure chronic ulcer of skin of other sites with other specified severity (HCC)  Rationale for Evaluation and Treatment Rehabilitation  PERTINENT HISTORY:  Rt TTA 11/11/21 due to osteomyelitis of Transmetatarsal Amputation, HTN, DM type 1, obesity, asthma as child, depression, HA migraines  PRECAUTIONS: None  SUBJECTIVE: She had adjustments  made to her prosthesis this morning, she reports it was angled back some and lengthened. She says so far so good with the adjustments and denies pain today. States she still feels unsteady walking on uneven ground PAIN:  Are you having pain?  0/10 pain upon arrival   OBJECTIVE: (objective measures completed at initial evaluation unless otherwise dated)    COGNITION: Overall cognitive status: Within functional limits for tasks assessed              SENSATION: Not tested   POSTURE: rounded shoulders, forward head, and weight shift left   LOWER EXTREMITY ROM:   ROM Right eval Left eval  Hip flexion      Hip extension      Hip abduction      Hip adduction      Hip internal rotation      Hip external rotation      Knee flexion      Knee extension      Ankle dorsiflexion      Ankle plantarflexion      Ankle inversion      Ankle eversion       (Blank rows = not tested)   LOWER EXTREMITY MMT:   MMT Right eval Left eval  Hip flexion      Hip extension      Hip abduction      Hip adduction      Hip internal rotation      Hip external rotation      Knee flexion      Knee extension      Ankle dorsiflexion      Ankle plantarflexion      Ankle inversion      Ankle eversion      (Blank rows = not tested)   TRANSFERS: 02/22/22 STS with SBA on chair without armrests, uses BUE and stabilizes without UE support  01/30/22 Sit to stand: SBA with verbal cueing for scooting forward in chair, used BUE to stand but could stabilize without UE support on RW Stand to sit: SBA, required BUE support to control descent   GAIT: 03/07/22 Pt amb in clinic without AD independently with Rt hip hike and abduction for foot clearance, requires minA for ramp and curb negotiation  01/30/22 Gait pattern: step to pattern, decreased step length- Left, decreased stance time- Right, decreased stride length, decreased hip/knee flexion- Right, Right hip hike, knee flexed in stance- Right, antalgic,  narrow BOS, and abducted- Right Distance walked: 30' Assistive device utilized: Environmental consultant - 2 wheeled Level of assistance: SBA Gait velocity: 1.10 ft/sec Comments: excessive weight bearing BUEs on RW and partial weight on prosthesis   FUNCTIONAL TESTs:  FGA 03/28/22  [facility] PT Assessment - 03/28/22 0001       Functional Gait  Assessment   Gait assessed  Yes    Gait Level Surface Walks 20 ft in less than 7 sec but greater than 5.5 sec, uses assistive device, slower speed, mild gait  deviations, or deviates 6-10 in outside of the 12 in walkway width.    Change in Gait Speed Able to change speed, demonstrates mild gait deviations, deviates 6-10 in outside of the 12 in walkway width, or no gait deviations, unable to achieve a major change in velocity, or uses a change in velocity, or uses an assistive device.    Gait with Horizontal Head Turns Performs head turns smoothly with slight change in gait velocity (eg, minor disruption to smooth gait path), deviates 6-10 in outside 12 in walkway width, or uses an assistive device.    Gait with Vertical Head Turns Performs task with moderate change in gait velocity, slows down, deviates 10-15 in outside 12 in walkway width but recovers, can continue to walk.    Gait and Pivot Turn Pivot turns safely in greater than 3 sec and stops with no loss of balance, or pivot turns safely within 3 sec and stops with mild imbalance, requires small steps to catch balance.    Step Over Obstacle Is able to step over one shoe box (4.5 in total height) but must slow down and adjust steps to clear box safely. May require verbal cueing.    Gait with Narrow Base of Support Ambulates less than 4 steps heel to toe or cannot perform without assistance.    Gait with Eyes Closed Walks 20 ft, slow speed, abnormal gait pattern, evidence for imbalance, deviates 10-15 in outside 12 in walkway width. Requires more than 9 sec to ambulate 20 ft.    Ambulating Backwards Walks 20 ft, slow speed,  abnormal gait pattern, evidence for imbalance, deviates 10-15 in outside 12 in walkway width.    Steps Two feet to a stair, must use rail.    Total Score 13             Berg Balance Scale: 02/28/22 score 40/56    Berg Balance Scale: 01/30/22 score 25/56   [facility] PT Assessment - 01/30/22 1110                Standardized Balance Assessment    Standardized Balance Assessment Berg Balance Test          Berg Balance Test    Sit to Stand Able to stand using hands after several tries     Standing Unsupported Needs several tries to stand 30 seconds unsupported     Sitting with Back Unsupported but Feet Supported on Floor or Stool Able to sit safely and securely 2 minutes     Stand to Sit Controls descent by using hands     Transfers Able to transfer safely, definite need of hands     Standing Unsupported with Eyes Closed Unable to keep eyes closed 3 seconds but stays steady     Standing Unsupported with Feet Together Needs help to attain position but able to stand for 30 seconds with feet together     From Standing, Reach Forward with Outstretched Arm Can reach confidently >25 cm (10")     From Standing Position, Pick up Object from Floor Able to pick up shoe, needs supervision     From Standing Position, Turn to Look Behind Over each Shoulder Looks behind one side only/other side shows less weight shift     Turn 360 Degrees Needs assistance while turning     Standing Unsupported, Alternately Place Feet on Step/Stool Needs assistance to keep from falling or unable to try     Standing Unsupported, One Foot in [employer] balance  while stepping or standing     Standing on One Leg Unable to try or needs assist to prevent fall     Total Score 25     Berg comment: BERG  < 36 high risk for falls (close to 100%) 46-51 moderate (>50%)   37-45 significant (>80%) 52-55 lower (> 25%)                 CURRENT PROSTHETIC WEAR ASSESSMENT: Patient is dependent with: skin check, residual limb care,  care of non-amputated limb, prosthetic cleaning, ply sock cleaning, correct ply sock adjustment, proper wear schedule/adjustment, and proper weight-bearing schedule/adjustment Donning prosthesis: Mod A and requires instructions for fit, donning Doffing prosthesis: SBA Prosthetic wear tolerance: 2 hours/day for 2 days since delivery (Saturday, Sunday) Prosthetic weight bearing tolerance: up to ~8 minutes with intermittent UE support with no complaints of residual limb pain or issues Edema: present Residual limb condition: one scab 17mm on lateral incision, one open wound superficial 6mm distal tibia, limb is cylindrical with normal temperature and color Prosthetic description: silicone liner with pin lock suspension, total contact socket, dynamic response foot     TODAY'S TREATMENT:  03/28/2022 Prosthetic Training with Transtibial Amputation prosthesis: Work on walking on uneven terrain with foam balance beam:  4 reps with narrow stance,  4 reps sidestepping. Walking on yoga mat that was on top on ankle weights 2 of each 1#,1.5#,2# to simulate uneven terrain 5 round trips with this Balance on BOSU blue side up with feet apart 1 min X 2 Stepping over foam beam without UE support leading with Rt LE, X 10 Walking up ramp X 2 with min A needed without SPC Walking up 6.5 inch step/curb CGA X2 FGA performed, see above for details.   03/14/2022 Prosthetic Training with Transtibial Amputation prosthesis: Wound on residual limb has dry 1mm scab with no signs of irritation or infection.  She reports "air" at times in prosthesis.  PT advised to dry limb & liner when toileting. PT reviewed use of antiperspirant daily & introduced Sweat Block wipes weekly.  Pt verbalized understanding.  Work on walking on uneven terrain with foam balance beam:  4 reps with narrow stance, 4 reps tandem, 4 reps sidestepping.  Step up & step down using prosthesis BUE support //bars leading up & down with BLEs 5 reps on 6"  step & progressed to BOSU round side up for compliant surface using momentum 5 reps.  PT demo & verbal cues on technique.  Lateral step up on BOSU round side up with stance cross over ant & post 5 reps with LLE & 5 reps with RLE. PT demo & verbal cues on technique Stairs with 2 rails alternating pattern with supervision / PT demo & verbal cues on technique.   03/07/2022 Prosthetic Training with Transtibial Amputation prosthesis: Wound on residual limb is fully healed with scab removed. PT demonstrated donning of prosthesis with tactile cues for pin alignment to pull soft tissue towards distal tibia, pt verbalized understanding PT educated on signs of sweating and antiperspirant application for sweat management, pt verbalized understanding PT educated with review on options for driving with pt verbalized understanding and returned demo with simulation Pt amb 215' SBA divided attention with naming animals A-Z and scannng items in clinic working on gait with distraction Pt ascend and descend 11 step staircase x 2 with SBA  -Ascend: pt shows more stability with BUE handrail able to alternate pattern, can ascend with single handrail (5 steps ea. UE)  with step-to pattern after cueing for technique  -Descend: pt can descend with alternating pattern with BUE handrail support and cueing for hand placement, can descend with single handrail (5 steps ea. UE) with step-to pattern and with decreased eccentric control Pt ascend and descend ramp and curb with minA to CGA without AD, verbal cueing for technique and momentum PT advised bringing cane to work as environment may require curb, ramp, or stairs with varying handrail support and she currently requires assistance with those tasks and pt verbalized understanding and agreement   03/03/2022 Prosthetic Training with Transtibial Amputation prosthesis: Doffed and donned prosthesis to examine Scab on lateral incision (closed and no signs of infection) and to  trouble shoot prosthetic fit. She was wearing 9 ply sock and still appeared to be sitting to low in prosthesis. Step ups fwd in bars 6 inch step with bilat UE support X 5 on Rt leg (for strengthening) then X 10 stepping up with left and down with Rt in front to simulate how she would properly perform stairs. Lateral step ups 6 inch step X 5 bilat with bilat UE support Dynamic balance walking in bars with head turns  X3 and head nods X 3 with intermittent UE support PRN Dynamic gait stepping over foam beam X 3 leading each way Balance on foam beam 1 min intemittent UE suppprt PRN Sit to stand standard chair using arm rests to ascend then no UE support to slowly descend with cues to avoid plop.   02/28/2022 Prosthetic Training with Transtibial Amputation prosthesis: Scab on lateral incision measuring 52mm by 56mm. No signs of infection PT wear time recommendations progressed to all day with 2 hour break in middle of day and changing under liner midpoint both wears due to sweat & switching liners at bathing to enable to clean without being late at night. Pt verbalized understanding PT advised driving recommendations with practicing in empty parking lot, pt verbalized understanding and received printed instructions (see pt instructions for details)  Physical performance tests: Sharlene Motts testing as noted above (improved to 40/56 from 25/56)   02/21/2022 Prosthetic Training with Transtibial Amputation prosthesis Residual limb is looking well with scab on lateral incision measuring 65mm across at the widest part PT educated on purpose of building wear time with recommended wear during illness and with consistent wear times of 5 hours 2x/day with 2 hour break, pt verbalized understanding PT educated on signs of sweating and drying liner/limb, pt verbalized understanding PT demo sit to/from stand in chair without armrests with visual and verbal explanation. Pt returned demo in chair with armrests x 2, chair  without armrests x 5 with SBA and verbal cueing, pt showed increased confidence with secure table in front of chair. PT suggested adding to HEP 2 x 5 reps, 3x day at dining room or kitchen table, pt verbalized understanding. Pt partial squat in chair without armrests to practice anterior and posterior scooting x 2 ea. SBA and verbal cueing for technique Pt picked up cloth from floor x 6 between two chairs with SBA, required verbal cueing for foot placement     HOME EXERCISE PROGRAM:   02/15/22 1214  PT Education  Education Details HEP at sink  Person(s) Educated Patient  Methods Explanation;Demonstration;Tactile cues;Verbal cues;Handout  Comprehension Verbalized understanding;Returned demonstration;Verbal cues required;Tactile cues required    Do each exercise 1-2  times per day Do each exercise 5-10 repetitions Hold each exercise for 2 seconds to feel your location  AT [facility] FIND YOUR MIDLINE POSITION  AND PLACE FEET EQUAL DISTANCE FROM THE MIDLINE.  Try to find this position when standing still for activities.   USE TAPE ON FLOOR TO MARK THE MIDLINE POSITION which is even with middle of sink.  You also should try to feel with your limb pressure in socket.  You are trying to feel with limb what you used to feel with the bottom of your foot.  Side to Side Shift: Moving your hips only (not shoulders): move weight onto your left leg, HOLD/FEEL pressure in socket.  Move back to equal weight on each leg, HOLD/FEEL pressure in socket. Move weight onto your right leg, HOLD/FEEL pressure in socket. Move back to equal weight on each leg, HOLD/FEEL pressure in socket. Repeat.  Start with both hands on sink, progress to hand on prosthetic side only, then no hands.  Front to Back Shift: Moving your hips only (not shoulders): move your weight forward onto your toes, HOLD/FEEL pressure in socket. Move your weight back to equal Flat Foot on both legs, HOLD/FEEL  pressure in socket. Move your weight back  onto your heels, HOLD/FEEL  pressure in socket. Move your weight back to equal on both legs, HOLD/FEEL  pressure in socket. Repeat.  Start with both hands on sink, progress to hand on prosthetic side only, then no hands.  Moving Cones / Cups: With equal weight on each leg: Hold on with one hand the first time, then progress to no hand supports. Move cups from one side of sink to the other. Place cups ~2" out of your reach, progress to 10" beyond reach.  Place one hand in middle of sink and reach with other hand. Do both arms.  Then hover one hand and move cups with other hand.  Overhead/Upward Reaching: alternated reaching up to top cabinets or ceiling if no cabinets present. Keep equal weight on each leg. Start with one hand support on counter while other hand reaches and progress to no hand support with reaching.  ace one hand in middle of sink and reach with other hand. Do both arms.  Then hover one hand and move cups with other hand.  5.   Looking Over Shoulders: With equal weight on each leg: alternate turning to look over your shoulders with one hand support on counter as needed.  Start with head motions only to look in front of shoulder, then even with shoulder and progress to looking behind you. To look to side, move head /eyes, then shoulder on side looking pulls back, shift more weight to side looking and pull hip back. Place one hand in middle of sink and let go with other hand so your shoulder can pull back. Switch hands to look other way.   Then hover one hand and look over shoulder. If looking right, use left hand at sink. If looking left, use right hand at sink. 6.  Stepping with leg that is not amputated:  Move items under cabinet out of your way. Shift your hips/pelvis so weight on prosthesis. Tighten muscles in hip on prosthetic side.  SLOWLY step other leg so front of foot is in cabinet. Then step back to floor.     ASSESSMENT:   CLINICAL IMPRESSION: She is doing well and progressing  well. She had adjustments made today to her prosthetic alignment by prosthetist and she seems to like these. Session focused on working on balance and gait over uneven surfaces and with higher level balance challenges. She is now for the most part ambulating  without AD but will continue to benefit from dynamic balance challenges to improve her stability with this.    OBJECTIVE IMPAIRMENTS Abnormal gait, decreased activity tolerance, decreased balance, decreased endurance, decreased knowledge of use of DME, decreased mobility, difficulty walking, decreased strength, postural dysfunction, and prosthetic dependency .    ACTIVITY LIMITATIONS carrying, lifting, bending, sitting, standing, squatting, stairs, transfers, locomotion level, and caring for others   PARTICIPATION LIMITATIONS: meal prep, cleaning, laundry, driving, community activity, and occupation   PERSONAL FACTORS Fitness, Time since onset of injury/illness/exacerbation, and 3+ comorbidities: see PMH  are also affecting patient's functional outcome.    REHAB POTENTIAL: Good   CLINICAL DECISION MAKING: Evolving/moderate complexity   EVALUATION COMPLEXITY: Moderate     GOALS: Goals reviewed with patient? Yes  UPDATED SHORT TERM GOALS: Target date: 03/31/22  1. Patient ambulates 200' with prosthesis modified independent. Baseline: SEE OBJECTIVE DATA Goal status: ongoing 03/14/2022   2. Patient negotiates ramps & curbs with cane & prosthesis with modified independent Baseline: SEE OBJECTIVE DATA Goal status: ongoing 03/14/2022  3. Berg Balance task >/= 43/56 to indicate lower fall risk Baseline: SEE OBJECTIVE DATA Goal status: ongoing 03/14/2022   LONG TERM GOALS: Target date: 04/28/2022   Patient demonstrates & verbalizes understanding of prosthetic care to enable safe utilization of prosthesis. Baseline: SEE OBJECTIVE DATA Goal status: ongoing 03/14/2022   Patient tolerates prosthesis wear >90% of awake hours without skin or  limb pain issues. Baseline: SEE OBJECTIVE DATA Goal status: ongoing 03/14/2022   Berg Balance task >/= 46/56 to indicate lower fall risk Baseline: SEE OBJECTIVE DATA Goal status: ongoing 03/14/2022   Patient safely ambulates >500' with prosthesis & cane or less independently. Baseline: SEE OBJECTIVE DATA Goal status: ongoing 03/14/2022   Patient negotiates ramps, curbs & stairs with single rail with prosthesis & cane or less independently. Baseline: SEE OBJECTIVE DATA Goal status: ongoing 03/14/2022   6.   Patient verbalizes & demonstrates work simulated tasks like lifting & carrying. Baseline: SEE OBJECTIVE DATA Goal status: ongoing 03/14/2022       PLAN: PT FREQUENCY: 1-2x/week   PT DURATION: 12 weeks   PLANNED INTERVENTIONS: Therapeutic exercises, Therapeutic activity, Neuromuscular re-education, Balance training, Gait training, Patient/Family education, Self Care, Stair training, Prosthetic training, DME instructions, scar mobilization, Ultrasound, Ionotophoresis /ml Dexamethasone, Manual therapy, Re-evaluation, and physical performance tests   PLAN FOR NEXT SESSION: reduce frequency to 1x/wk,   progress functional activities towards STGs & LTGs   April Manson, PT, DPT 03/28/2022, 10:48 AM

## 2022-03-30 ENCOUNTER — Encounter: Payer: BC Managed Care – PPO | Admitting: Physical Therapy

## 2022-04-04 ENCOUNTER — Encounter: Payer: Self-pay | Admitting: Physical Therapy

## 2022-04-04 ENCOUNTER — Ambulatory Visit (INDEPENDENT_AMBULATORY_CARE_PROVIDER_SITE_OTHER): Payer: BC Managed Care – PPO | Admitting: Physical Therapy

## 2022-04-04 DIAGNOSIS — R2681 Unsteadiness on feet: Secondary | ICD-10-CM | POA: Diagnosis not present

## 2022-04-04 DIAGNOSIS — R2689 Other abnormalities of gait and mobility: Secondary | ICD-10-CM | POA: Diagnosis not present

## 2022-04-04 DIAGNOSIS — M6281 Muscle weakness (generalized): Secondary | ICD-10-CM

## 2022-04-04 NOTE — Patient Instructions (Signed)
At sink  Alternate tapping feet in lower cabinet. Balancing on one leg: place one forefoot in lower cabinet & hoover hands over counter while counting. Do 5 times on each leg. Stand with one foot in front of other count outloud while hoovering hands (stop counting if touch) goal is to get to 60.  Switch which leg is in front.    Between 2 chairs facing each other Lunges alternating lead leg. Try to do 10 with each leg leading.

## 2022-04-04 NOTE — Therapy (Signed)
OUTPATIENT PHYSICAL THERAPY TREATMENT NOTE   Patient Name: Valerie Le MRN: [id number] DOB:March 15, 1971, 51 y.o., female Today's Date: 04/04/2022  PCP: Willeen Niece, PA REFERRING PROVIDER: Newt Minion, MD  END OF SESSION:   PT End of Session - 04/04/22 1002     Visit Number 10    Number of Visits 25    Date for PT Re-Evaluation 04/28/22    Authorization Type BCBS    Authorization Time Period $50 co-pay    Progress Note Due on Visit 20    PT Start Time 1003    PT Stop Time 1056    PT Time Calculation (min) 53 min    Equipment Utilized During Treatment Gait belt    Activity Tolerance Patient tolerated treatment well    Behavior During Therapy WFL for tasks assessed/performed                Past Medical History:  Diagnosis Date   Anemia    Asthma    as a child   Depression    Diabetes mellitus without complication ([place])    Type 1 - Has Insulin Pump   Headache    migraines   High cholesterol    Hypertension    Past Surgical History:  Procedure Laterality Date   AMPUTATION Right 12/22/2020   Procedure: RIGHT TRANSMETATARSAL AMPUTATION;  Surgeon: Newt Minion, MD;  Location: [place];  Service: Orthopedics;  Laterality: Right;   AMPUTATION Right 06/29/2021   Procedure: REVISION RIGHT TRANSMETATARSAL AMPUTATION;  Surgeon: Newt Minion, MD;  Location: [place];  Service: Orthopedics;  Laterality: Right;   AMPUTATION Right 11/11/2021   Procedure: RIGHT BELOW KNEE AMPUTATION;  Surgeon: Newt Minion, MD;  Location: [place];  Service: Orthopedics;  Laterality: Right;   AMPUTATION TOE Right 12/04/2020   Procedure: AMPUTATION RIGHT SECOND TOE, I&D FOOT;  Surgeon: Marybelle Killings, MD;  Location: [facility];  Service: Orthopedics;  Laterality: Right;  Right second toe amputation, I&D right foot.   APPLICATION OF WOUND VAC  11/11/2021   Procedure: APPLICATION OF WOUND VAC;  Surgeon: Newt Minion, MD;  Location: [place];  Service: Orthopedics;;   birth control implant   07/31/2006   Essure Implant   TONSILLECTOMY     TYMPANOSTOMY TUBE PLACEMENT     WISDOM TOOTH EXTRACTION     Patient Active Problem List   Diagnosis Date Noted   S/P BKA (below knee amputation), right ([place]) 11/16/2021   Below-knee amputation of right lower extremity ([place]) 11/11/2021   Hypoalbuminemia 11/03/2021   Non-pressure chronic ulcer of other part of right foot limited to breakdown of skin (H[facility])    Chest pain 11/02/2021   Elevated troponin 11/02/2021   SIRS (systemic inflammatory response syndrome) ([place]) 11/02/2021   Diabetes mellitus type 1 ([place]) 11/02/2021   Thrombocytosis 11/02/2021   Microcytic hypochromic anemia 11/02/2021   Hypokalemia 11/02/2021   Morbid obesity ([place]) 11/02/2021   Medication monitoring encounter 06/15/2021   Smoking 02/01/2021   Acute hematogenous osteomyelitis of right foot ([place]) 12/22/2020   Osteomyelitis of foot, right, acute ([place])    Dehiscence of amputation stump ([place])    Diabetic foot infection ([place]) 12/02/2020   Essential hypertension 12/02/2020   Hyperlipidemia 08/21/2018   Bipolar affective disorder, currently depressed, moderate ([place]) 01/23/2017   Insulin-treated type 2 diabetes mellitus ([place]) 03/07/2013   Asthma 03/07/2013   ADHD (attention deficit hyperactivity disorder) 03/07/2013   Migraine 03/07/2013   Tobacco abuse 03/07/2013    REFERRING DIAG: K91.791T (  ICD-10-CM) - Below-knee amputation of right lower extremity (HCC)    ONSET DATE: 01/27/2022 prosthesis delivery  THERAPY DIAG:  Other abnormalities of gait and mobility  Unsteadiness on feet  Muscle weakness (generalized)  Rationale for Evaluation and Treatment Rehabilitation  PERTINENT HISTORY:  Rt TTA 11/11/21 due to osteomyelitis of Transmetatarsal Amputation, HTN, DM type 1, obesity, asthma as child, depression, HA migraines  PRECAUTIONS: None  SUBJECTIVE: She has started getting callous over end of bone. No issues noted.   PAIN:  Are you having pain?  0/10 pain  upon arrival   OBJECTIVE: (objective measures completed at initial evaluation unless otherwise dated)    COGNITION: Overall cognitive status: Within functional limits for tasks assessed              SENSATION: Not tested   POSTURE: rounded shoulders, forward head, and weight shift left   LOWER EXTREMITY ROM:   ROM Right eval Left eval  Hip flexion      Hip extension      Hip abduction      Hip adduction      Hip internal rotation      Hip external rotation      Knee flexion      Knee extension      Ankle dorsiflexion      Ankle plantarflexion      Ankle inversion      Ankle eversion       (Blank rows = not tested)   LOWER EXTREMITY MMT:   MMT Right eval Left eval  Hip flexion      Hip extension      Hip abduction      Hip adduction      Hip internal rotation      Hip external rotation      Knee flexion      Knee extension      Ankle dorsiflexion      Ankle plantarflexion      Ankle inversion      Ankle eversion      (Blank rows = not tested)   TRANSFERS: 02/22/22 STS with SBA on chair without armrests, uses BUE and stabilizes without UE support  01/30/22 Sit to stand: SBA with verbal cueing for scooting forward in chair, used BUE to stand but could stabilize without UE support on RW Stand to sit: SBA, required BUE support to control descent   GAIT: 03/07/22 Pt amb in clinic without AD independently with Rt hip hike and abduction for foot clearance, requires minA for ramp and curb negotiation  01/30/22 Gait pattern: step to pattern, decreased step length- Left, decreased stance time- Right, decreased stride length, decreased hip/knee flexion- Right, Right hip hike, knee flexed in stance- Right, antalgic, narrow BOS, and abducted- Right Distance walked: 30' Assistive device utilized: Environmental consultant - 2 wheeled Level of assistance: SBA Gait velocity: 1.10 ft/sec Comments: excessive weight bearing BUEs on RW and partial weight on prosthesis   FUNCTIONAL TESTs:   04/04/2022  Berg Balance  48/56  [facility] PT Assessment - 04/04/22 1000       Standardized Balance Assessment   Standardized Balance Assessment Berg Balance Test      Berg Balance Test   Sit to Stand Able to stand  independently using hands    Standing Unsupported Able to stand safely 2 minutes    Sitting with Back Unsupported but Feet Supported on Floor or Stool Able to sit safely and securely 2 minutes    Stand to Sit  Sits safely with minimal use of hands    Transfers Able to transfer safely, minor use of hands    Standing Unsupported with Eyes Closed Able to stand 10 seconds safely    Standing Unsupported with Feet Together Able to place feet together independently and stand 1 minute safely    From Standing, Reach Forward with Outstretched Arm Can reach confidently >25 cm (10")    From Standing Position, Pick up Object from Floor Able to pick up shoe safely and easily    From Standing Position, Turn to Look Behind Over each Shoulder Looks behind from both sides and weight shifts well    Turn 360 Degrees Able to turn 360 degrees safely in 4 seconds or less    Standing Unsupported, Alternately Place Feet on Step/Stool Able to complete >2 steps/needs minimal assist    Standing Unsupported, One Foot in Front Able to plae foot ahead of the other independently and hold 30 seconds    Standing on One Leg Tries to lift leg/unable to hold 3 seconds but remains standing independently    Total Score 48              FGA 03/28/22  13/30  [facility] PT Assessment - 04/04/22 1000       Standardized Balance Assessment   Standardized Balance Assessment Berg Balance Test      Berg Balance Test   Sit to Stand Able to stand  independently using hands    Standing Unsupported Able to stand safely 2 minutes    Sitting with Back Unsupported but Feet Supported on Floor or Stool Able to sit safely and securely 2 minutes    Stand to Sit Sits safely with minimal use of hands    Transfers Able to transfer  safely, minor use of hands    Standing Unsupported with Eyes Closed Able to stand 10 seconds safely    Standing Unsupported with Feet Together Able to place feet together independently and stand 1 minute safely    From Standing, Reach Forward with Outstretched Arm Can reach confidently >25 cm (10")    From Standing Position, Pick up Object from Floor Able to pick up shoe safely and easily    From Standing Position, Turn to Look Behind Over each Shoulder Looks behind from both sides and weight shifts well    Turn 360 Degrees Able to turn 360 degrees safely in 4 seconds or less    Standing Unsupported, Alternately Place Feet on Step/Stool Able to complete >2 steps/needs minimal assist    Standing Unsupported, One Foot in Front Able to plae foot ahead of the other independently and hold 30 seconds    Standing on One Leg Tries to lift leg/unable to hold 3 seconds but remains standing independently    Total Score 48            [facility] PT Assessment - 03/28/22 0001                Functional Gait  Assessment    Gait assessed  Yes     Gait Level Surface Walks 20 ft in less than 7 sec but greater than 5.5 sec, uses assistive device, slower speed, mild gait deviations, or deviates 6-10 in outside of the 12 in walkway width.     Change in Gait Speed Able to change speed, demonstrates mild gait deviations, deviates 6-10 in outside of the 12 in walkway width, or no gait deviations, unable to achieve a major change in  velocity, or uses a change in velocity, or uses an assistive device.     Gait with Horizontal Head Turns Performs head turns smoothly with slight change in gait velocity (eg, minor disruption to smooth gait path), deviates 6-10 in outside 12 in walkway width, or uses an assistive device.     Gait with Vertical Head Turns Performs task with moderate change in gait velocity, slows down, deviates 10-15 in outside 12 in walkway width but recovers, can continue to walk.     Gait and Pivot Turn  Pivot turns safely in greater than 3 sec and stops with no loss of balance, or pivot turns safely within 3 sec and stops with mild imbalance, requires small steps to catch balance.     Step Over Obstacle Is able to step over one shoe box (4.5 in total height) but must slow down and adjust steps to clear box safely. May require verbal cueing.     Gait with Narrow Base of Support Ambulates less than 4 steps heel to toe or cannot perform without assistance.     Gait with Eyes Closed Walks 20 ft, slow speed, abnormal gait pattern, evidence for imbalance, deviates 10-15 in outside 12 in walkway width. Requires more than 9 sec to ambulate 20 ft.     Ambulating Backwards Walks 20 ft, slow speed, abnormal gait pattern, evidence for imbalance, deviates 10-15 in outside 12 in walkway width.     Steps Two feet to a stair, must use rail.     Total Score 13      Berg Balance Scale: 02/28/22 score 40/56  [facility] PT Assessment - 04/04/22 1000       Standardized Balance Assessment   Standardized Balance Assessment Berg Balance Test      Berg Balance Test   Sit to Stand Able to stand  independently using hands    Standing Unsupported Able to stand safely 2 minutes    Sitting with Back Unsupported but Feet Supported on Floor or Stool Able to sit safely and securely 2 minutes    Stand to Sit Sits safely with minimal use of hands    Transfers Able to transfer safely, minor use of hands    Standing Unsupported with Eyes Closed Able to stand 10 seconds safely    Standing Unsupported with Feet Together Able to place feet together independently and stand 1 minute safely    From Standing, Reach Forward with Outstretched Arm Can reach confidently >25 cm (10")    From Standing Position, Pick up Object from Floor Able to pick up shoe safely and easily    From Standing Position, Turn to Look Behind Over each Shoulder Looks behind from both sides and weight shifts well    Turn 360 Degrees Able to turn 360 degrees safely  in 4 seconds or less    Standing Unsupported, Alternately Place Feet on Step/Stool Able to complete >2 steps/needs minimal assist    Standing Unsupported, One Foot in Front Able to plae foot ahead of the other independently and hold 30 seconds    Standing on One Leg Tries to lift leg/unable to hold 3 seconds but remains standing independently    Total Score 48            [facility] PT Assessment - 02/28/22 1414                Standardized Balance Assessment    Standardized Balance Assessment Berg Balance Test  Berg Balance Test    Sit to Stand Able to stand  independently using hands     Standing Unsupported Able to stand safely 2 minutes     Sitting with Back Unsupported but Feet Supported on Floor or Stool Able to sit safely and securely 2 minutes     Stand to Sit Controls descent by using hands     Transfers Able to transfer safely, definite need of hands     Standing Unsupported with Eyes Closed Able to stand 10 seconds safely     Standing Unsupported with Feet Together Able to place feet together independently and stand 1 minute safely     From Standing, Reach Forward with Outstretched Arm Can reach forward >12 cm safely (5")     From Standing Position, Pick up Object from Floor Able to pick up shoe safely and easily     From Standing Position, Turn to Look Behind Over each Shoulder Looks behind one side only/other side shows less weight shift     Turn 360 Degrees Able to turn 360 degrees safely but slowly     Standing Unsupported, Alternately Place Feet on Step/Stool Needs assistance to keep from falling or unable to try     Standing Unsupported, One Foot in Front Able to take small step independently and hold 30 seconds     Standing on One Leg Tries to lift leg/unable to hold 3 seconds but remains standing independently     Total Score 40     Berg Balance Scale: 01/30/22 score 25/56   [facility] PT Assessment - 01/30/22 1110                Standardized Balance Assessment     Standardized Balance Assessment Berg Balance Test          Berg Balance Test    Sit to Stand Able to stand using hands after several tries     Standing Unsupported Needs several tries to stand 30 seconds unsupported     Sitting with Back Unsupported but Feet Supported on Floor or Stool Able to sit safely and securely 2 minutes     Stand to Sit Controls descent by using hands     Transfers Able to transfer safely, definite need of hands     Standing Unsupported with Eyes Closed Unable to keep eyes closed 3 seconds but stays steady     Standing Unsupported with Feet Together Needs help to attain position but able to stand for 30 seconds with feet together     From Standing, Reach Forward with Outstretched Arm Can reach confidently >25 cm (10")     From Standing Position, Pick up Object from Floor Able to pick up shoe, needs supervision     From Standing Position, Turn to Look Behind Over each Shoulder Looks behind one side only/other side shows less weight shift     Turn 360 Degrees Needs assistance while turning     Standing Unsupported, Alternately Place Feet on Step/Stool Needs assistance to keep from falling or unable to try     Standing Unsupported, One Foot in [employer] balance while stepping or standing     Standing on One Leg Unable to try or needs assist to prevent fall     Total Score 25     Berg comment: BERG  < 36 high risk for falls (close to 100%) 46-51 moderate (>50%)   37-45 significant (>80%) 52-55 lower (> 25%)  CURRENT PROSTHETIC WEAR ASSESSMENT: Patient is dependent with: skin check, residual limb care, care of non-amputated limb, prosthetic cleaning, ply sock cleaning, correct ply sock adjustment, proper wear schedule/adjustment, and proper weight-bearing schedule/adjustment Donning prosthesis: Mod A and requires instructions for fit, donning Doffing prosthesis: SBA Prosthetic wear tolerance: 2 hours/day for 2 days since delivery (Saturday,  Sunday) Prosthetic weight bearing tolerance: up to ~8 minutes with intermittent UE support with no complaints of residual limb pain or issues Edema: present Residual limb condition: one scab 48m on lateral incision, one open wound superficial 656mdistal tibia, limb is cylindrical with normal temperature and color Prosthetic description: silicone liner with pin lock suspension, total contact socket, dynamic response foot     TODAY'S TREATMENT:  04/04/2022 Neuromuscular Re-education See BeMerrilee Janskyalance in objective 48/56 Counter top activities: alternate tapping toes in lower cabinet,  SLS with contralateral LE in lower cabinet & tandem stance. HEP  At sink  Alternate tapping feet in lower cabinet. Balancing on one leg: place one forefoot in lower cabinet & hoover hands over counter while counting. Do 5 times on each leg. Stand with one foot in front of other count outloud while hoovering hands (stop counting if touch) goal is to get to 60.  Switch which leg is in front.     Therapeutic Activities: PT demo & verbal cues on getting on & off floor via half kneeling. Pt return demo & verbalized understanding difficult due to LE strength. Added to HEP Between 2 chairs facing each other Lunges alternating lead leg. Try to do 10 with each leg leading.  Prosthetic Training with Transtibial Amputation prosthesis: PT assessed residual limb that has callous at distal tibia.  PT recommended lotion at night with removal in morning prior to donning prosthesis. Pt verbalized understanding.   Therapeutic Exercise: Pt requested info on returning to [facility] row machine. Pt verbal instructed in getting on / off row machine. PT recommended 30m730msets initially to build overall tolerance.  Also PT instructed in set-up & use of recumbent stepper which is also available at [facility] verbalized understanding.   03/28/2022 Prosthetic Training with Transtibial Amputation prosthesis: Work on  walking on uneven terrain with foam balance beam:  4 reps with narrow stance,  4 reps sidestepping. Walking on yoga mat that was on top on ankle weights 2 of each 1#,1.5#,2# to simulate uneven terrain 5 round trips with this Balance on BOSU blue side up with feet apart 1 min X 2 Stepping over foam beam without UE support leading with Rt LE, X 10 Walking up ramp X 2 with min A needed without SPC Walking up 6.5 inch step/curb CGA X2 FGA performed, see above for details.   03/14/2022 Prosthetic Training with Transtibial Amputation prosthesis: Wound on residual limb has dry 1mm70mab with no signs of irritation or infection.  She reports "air" at times in prosthesis.  PT advised to dry limb & liner when toileting. PT reviewed use of antiperspirant daily & introduced Sweat Block wipes weekly.  Pt verbalized understanding.  Work on walking on uneven terrain with foam balance beam:  4 reps with narrow stance, 4 reps tandem, 4 reps sidestepping.  Step up & step down using prosthesis BUE support //bars leading up & down with BLEs 5 reps on 6" step & progressed to BOSU round side up for compliant surface using momentum 5 reps.  PT demo & verbal cues on technique.  Lateral step up on BOSU round side up  with stance cross over ant & post 5 reps with LLE & 5 reps with RLE. PT demo & verbal cues on technique Stairs with 2 rails alternating pattern with supervision / PT demo & verbal cues on technique.  HOME EXERCISE PROGRAM:   02/15/22 1214  PT Education  Education Details HEP at sink  Person(s) Educated Patient  Methods Explanation;Demonstration;Tactile cues;Verbal cues;Handout  Comprehension Verbalized understanding;Returned demonstration;Verbal cues required;Tactile cues required    Do each exercise 1-2  times per day Do each exercise 5-10 repetitions Hold each exercise for 2 seconds to feel your location  AT [place].  Try to find  this position when standing still for activities.   USE TAPE ON FLOOR TO MARK THE MIDLINE POSITION which is even with middle of sink.  You also should try to feel with your limb pressure in socket.  You are trying to feel with limb what you used to feel with the bottom of your foot.  Side to Side Shift: Moving your hips only (not shoulders): move weight onto your left leg, HOLD/FEEL pressure in socket.  Move back to equal weight on each leg, HOLD/FEEL pressure in socket. Move weight onto your right leg, HOLD/FEEL pressure in socket. Move back to equal weight on each leg, HOLD/FEEL pressure in socket. Repeat.  Start with both hands on sink, progress to hand on prosthetic side only, then no hands.  Front to Back Shift: Moving your hips only (not shoulders): move your weight forward onto your toes, HOLD/FEEL pressure in socket. Move your weight back to equal Flat Foot on both legs, HOLD/FEEL  pressure in socket. Move your weight back onto your heels, HOLD/FEEL  pressure in socket. Move your weight back to equal on both legs, HOLD/FEEL  pressure in socket. Repeat.  Start with both hands on sink, progress to hand on prosthetic side only, then no hands.  Moving Cones / Cups: With equal weight on each leg: Hold on with one hand the first time, then progress to no hand supports. Move cups from one side of sink to the other. Place cups ~2" out of your reach, progress to 10" beyond reach.  Place one hand in middle of sink and reach with other hand. Do both arms.  Then hover one hand and move cups with other hand.  Overhead/Upward Reaching: alternated reaching up to top cabinets or ceiling if no cabinets present. Keep equal weight on each leg. Start with one hand support on counter while other hand reaches and progress to no hand support with reaching.  ace one hand in middle of sink and reach with other hand. Do both arms.  Then hover one hand and move cups with other hand.  5.   Looking Over Shoulders: With equal  weight on each leg: alternate turning to look over your shoulders with one hand support on counter as needed.  Start with head motions only to look in front of shoulder, then even with shoulder and progress to looking behind you. To look to side, move head /eyes, then shoulder on side looking pulls back, shift more weight to side looking and pull hip back. Place one hand in middle of sink and let go with other hand so your shoulder can pull back. Switch hands to look other way.   Then hover one hand and look over shoulder. If looking right, use left hand at sink. If looking left, use right hand at sink.  6.  Stepping with leg that is not amputated:  Move items under cabinet out of your way. Shift your hips/pelvis so weight on prosthesis. Tighten muscles in hip on prosthetic side.  SLOWLY step other leg so front of foot is in cabinet. Then step back to floor.     ASSESSMENT:   CLINICAL IMPRESSION: Patient needs to be able to get on /off floor for her job.  PT instructed in technique and she has general understanding.  Patient's Berg Balance improved to >45/56 indicating lower fall risk.   OBJECTIVE IMPAIRMENTS Abnormal gait, decreased activity tolerance, decreased balance, decreased endurance, decreased knowledge of use of DME, decreased mobility, difficulty walking, decreased strength, postural dysfunction, and prosthetic dependency .    ACTIVITY LIMITATIONS carrying, lifting, bending, sitting, standing, squatting, stairs, transfers, locomotion level, and caring for others   PARTICIPATION LIMITATIONS: meal prep, cleaning, laundry, driving, community activity, and occupation   [place], Time since onset of injury/illness/exacerbation, and 3+ comorbidities: see PMH  are also affecting patient's functional outcome.    REHAB POTENTIAL: Good   CLINICAL DECISION MAKING: Evolving/moderate complexity   EVALUATION COMPLEXITY: Moderate     GOALS: Goals reviewed with patient?  Yes  UPDATED SHORT TERM GOALS: Target date: 03/31/22  1. Patient ambulates 200' with prosthesis modified independent. Baseline: SEE OBJECTIVE DATA Goal status: MET 04/04/2022   2. Patient negotiates ramps & curbs with cane & prosthesis with modified independent Baseline: SEE OBJECTIVE DATA Goal status: MET 04/04/2022  3. Berg Balance task >/= 43/56 to indicate lower fall risk Baseline: SEE OBJECTIVE DATA Goal status: MET 04/04/2022   LONG TERM GOALS: Target date: 04/28/2022   Patient demonstrates & verbalizes understanding of prosthetic care to enable safe utilization of prosthesis. Baseline: SEE OBJECTIVE DATA Goal status: ongoing 03/14/2022   Patient tolerates prosthesis wear >90% of awake hours without skin or limb pain issues. Baseline: SEE OBJECTIVE DATA Goal status: ongoing 03/14/2022   Berg Balance task >/= 46/56 to indicate lower fall risk Baseline: SEE OBJECTIVE DATA Goal status: MET 04/04/2022   Patient safely ambulates >500' with prosthesis & cane or less independently. Baseline: SEE OBJECTIVE DATA Goal status: ongoing 03/14/2022   Patient negotiates ramps, curbs & stairs with single rail with prosthesis & cane or less independently. Baseline: SEE OBJECTIVE DATA Goal status: ongoing 03/14/2022   6.   Patient verbalizes & demonstrates work simulated tasks like lifting & carrying. Baseline: SEE OBJECTIVE DATA Goal status: ongoing 03/14/2022       PLAN: PT FREQUENCY: 1-2x/week   PT DURATION: 12 weeks   PLANNED INTERVENTIONS: Therapeutic exercises, Therapeutic activity, Neuromuscular re-education, Balance training, Gait training, Patient/Family education, Self Care, Stair training, Prosthetic training, DME instructions, scar mobilization, Ultrasound, Ionotophoresis 62m/ml Dexamethasone, Manual therapy, Re-evaluation, and physical performance tests   PLAN FOR NEXT SESSION:   instruct in use of weight machines,    progress functional activities towards  LTGs   RJamey Reas PT, DPT 04/04/2022, 4:23 PM

## 2022-04-11 ENCOUNTER — Ambulatory Visit (INDEPENDENT_AMBULATORY_CARE_PROVIDER_SITE_OTHER): Payer: BC Managed Care – PPO | Admitting: Physical Therapy

## 2022-04-11 ENCOUNTER — Encounter: Payer: Self-pay | Admitting: Physical Therapy

## 2022-04-11 DIAGNOSIS — R2689 Other abnormalities of gait and mobility: Secondary | ICD-10-CM | POA: Diagnosis not present

## 2022-04-11 DIAGNOSIS — M6281 Muscle weakness (generalized): Secondary | ICD-10-CM | POA: Diagnosis not present

## 2022-04-11 DIAGNOSIS — R293 Abnormal posture: Secondary | ICD-10-CM

## 2022-04-11 DIAGNOSIS — L98498 Non-pressure chronic ulcer of skin of other sites with other specified severity: Secondary | ICD-10-CM

## 2022-04-11 DIAGNOSIS — R2681 Unsteadiness on feet: Secondary | ICD-10-CM | POA: Diagnosis not present

## 2022-04-11 NOTE — Therapy (Signed)
OUTPATIENT PHYSICAL THERAPY TREATMENT NOTE   Patient Name: Valerie Le MRN: [id number] DOB:1970-10-09, 51 y.o., female Today's Date: 04/11/2022  PCP: Willeen Niece, PA REFERRING PROVIDER: Newt Minion, MD  END OF SESSION:   PT End of Session - 04/11/22 1135     Visit Number 11    Number of Visits 25    Date for PT Re-Evaluation 04/28/22    Authorization Type BCBS    Authorization Time Period $50 co-pay    Progress Note Due on Visit 66    PT Start Time 1135    PT Stop Time 1228    PT Time Calculation (min) 53 min    Equipment Utilized During Treatment Gait belt    Activity Tolerance Patient tolerated treatment well    Behavior During Therapy WFL for tasks assessed/performed                 Past Medical History:  Diagnosis Date   Anemia    Asthma    as a child   Depression    Diabetes mellitus without complication ([place])    Type 1 - Has Insulin Pump   Headache    migraines   High cholesterol    Hypertension    Past Surgical History:  Procedure Laterality Date   AMPUTATION Right 12/22/2020   Procedure: RIGHT TRANSMETATARSAL AMPUTATION;  Surgeon: Newt Minion, MD;  Location: [facility];  Service: Orthopedics;  Laterality: Right;   AMPUTATION Right 06/29/2021   Procedure: REVISION RIGHT TRANSMETATARSAL AMPUTATION;  Surgeon: Newt Minion, MD;  Location: [facility];  Service: Orthopedics;  Laterality: Right;   AMPUTATION Right 11/11/2021   Procedure: RIGHT BELOW KNEE AMPUTATION;  Surgeon: Newt Minion, MD;  Location: [place] [facility];  Service: Orthopedics;  Laterality: Right;   AMPUTATION TOE Right 12/04/2020   Procedure: AMPUTATION RIGHT SECOND TOE, I&D FOOT;  Surgeon: Marybelle Killings, MD;  Location: [facility];  Service: Orthopedics;  Laterality: Right;  Right second toe amputation, I&D right foot.   APPLICATION OF WOUND VAC  11/11/2021   Procedure: APPLICATION OF WOUND VAC;  Surgeon: Newt Minion, MD;  Location: [facility];  Service: Orthopedics;;   birth control  implant  07/31/2006   Essure Implant   TONSILLECTOMY     TYMPANOSTOMY TUBE PLACEMENT     WISDOM TOOTH EXTRACTION     Patient Active Problem List   Diagnosis Date Noted   S/P BKA (below knee amputation), right ([place]) 11/16/2021   Below-knee amputation of right lower extremity ([place]) 11/11/2021   Hypoalbuminemia 11/03/2021   Non-pressure chronic ulcer of other part of right foot limited to breakdown of skin ([facility])    Chest pain 11/02/2021   Elevated troponin 11/02/2021   SIRS (systemic inflammatory response syndrome) ([place]) 11/02/2021   Diabetes mellitus type 1 ([place]) 11/02/2021   Thrombocytosis 11/02/2021   Microcytic hypochromic anemia 11/02/2021   Hypokalemia 11/02/2021   Morbid obesity ([place]) 11/02/2021   Medication monitoring encounter 06/15/2021   Smoking 02/01/2021   Acute hematogenous osteomyelitis of right foot ([place]) 12/22/2020   Osteomyelitis of foot, right, acute ([place])    Dehiscence of amputation stump ([place])    Diabetic foot infection ([place]) 12/02/2020   Essential hypertension 12/02/2020   Hyperlipidemia 08/21/2018   Bipolar affective disorder, currently depressed, moderate ([place]) 01/23/2017   Insulin-treated type 2 diabetes mellitus ([place]) 03/07/2013   Asthma 03/07/2013   ADHD (attention deficit hyperactivity disorder) 03/07/2013   Migraine 03/07/2013   Tobacco abuse 03/07/2013    REFERRING DIAG:  Y30.160F (ICD-10-CM) - Below-knee amputation of right lower extremity ([place])    ONSET DATE: 01/27/2022 prosthesis delivery  THERAPY DIAG:  Other abnormalities of gait and mobility  Unsteadiness on feet  Muscle weakness (generalized)  Non-pressure chronic ulcer of skin of other sites with other specified severity ([place])  Abnormal posture  Rationale for Evaluation and Treatment Rehabilitation  PERTINENT HISTORY:  Rt TTA 11/11/21 due to osteomyelitis of Transmetatarsal Amputation, HTN, DM type 1, obesity, asthma as child, depression, HA migraines  PRECAUTIONS:  None  SUBJECTIVE:  She questioned wearing long pants and her socket is loose. 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   PAIN:  Are you having pain?  0/10 pain upon arrival   OBJECTIVE: (objective measures completed at initial evaluation unless otherwise dated)    COGNITION: Overall cognitive status: Within functional limits for tasks assessed              SENSATION: Not tested   POSTURE: rounded shoulders, forward head, and weight shift left   LOWER EXTREMITY ROM:   ROM Right eval Left eval  Hip flexion      Hip extension      Hip abduction      Hip adduction      Hip internal rotation      Hip external rotation      Knee flexion      Knee extension      Ankle dorsiflexion      Ankle plantarflexion      Ankle inversion      Ankle eversion       (Blank rows = not tested)   LOWER EXTREMITY MMT:   MMT Right eval Left eval  Hip flexion      Hip extension      Hip abduction      Hip adduction      Hip internal rotation      Hip external rotation      Knee flexion      Knee extension      Ankle dorsiflexion      Ankle plantarflexion      Ankle inversion      Ankle eversion      (Blank rows = not tested)   TRANSFERS: 02/22/22 STS with SBA on chair without armrests, uses BUE and stabilizes without UE support  01/30/22 Sit to stand: SBA with verbal cueing for scooting forward in chair, used BUE to stand but could stabilize without UE support on RW Stand to sit: SBA, required BUE support to control descent   GAIT: 03/07/22 Pt amb in clinic without AD independently with Rt hip hike and abduction for foot clearance, requires minA for ramp and curb negotiation  01/30/22 Gait pattern: step to pattern, decreased step length- Left, decreased stance time- Right, decreased stride length, decreased hip/knee flexion- Right, Right hip hike, knee flexed in stance- Right, antalgic, narrow BOS, and abducted- Right Distance walked:  30' Assistive device utilized: Environmental consultant - 2 wheeled Level of assistance: SBA Gait velocity: 1.10 ft/sec Comments: excessive weight bearing BUEs on RW and partial weight on prosthesis   FUNCTIONAL TESTs:  04/04/2022  Berg Balance  48/56     FGA 03/28/22  13/30   [facility] PT Assessment - 03/28/22 0001                Functional Gait  Assessment    Gait assessed  Yes     Gait Level Surface Walks 20 ft  in less than 7 sec but greater than 5.5 sec, uses assistive device, slower speed, mild gait deviations, or deviates 6-10 in outside of the 12 in walkway width.     Change in Gait Speed Able to change speed, demonstrates mild gait deviations, deviates 6-10 in outside of the 12 in walkway width, or no gait deviations, unable to achieve a major change in velocity, or uses a change in velocity, or uses an assistive device.     Gait with Horizontal Head Turns Performs head turns smoothly with slight change in gait velocity (eg, minor disruption to smooth gait path), deviates 6-10 in outside 12 in walkway width, or uses an assistive device.     Gait with Vertical Head Turns Performs task with moderate change in gait velocity, slows down, deviates 10-15 in outside 12 in walkway width but recovers, can continue to walk.     Gait and Pivot Turn Pivot turns safely in greater than 3 sec and stops with no loss of balance, or pivot turns safely within 3 sec and stops with mild imbalance, requires small steps to catch balance.     Step Over Obstacle Is able to step over one shoe box (4.5 in total height) but must slow down and adjust steps to clear box safely. May require verbal cueing.     Gait with Narrow Base of Support Ambulates less than 4 steps heel to toe or cannot perform without assistance.     Gait with Eyes Closed Walks 20 ft, slow speed, abnormal gait pattern, evidence for imbalance, deviates 10-15 in outside 12 in walkway width. Requires more than 9 sec to ambulate 20 ft.     Ambulating Backwards  Walks 20 ft, slow speed, abnormal gait pattern, evidence for imbalance, deviates 10-15 in outside 12 in walkway width.     Steps Two feet to a stair, must use rail.     Total Score 13      Berg Balance Scale: 02/28/22 score 40/56   [facility] PT Assessment - 02/28/22 1414                Standardized Balance Assessment    Standardized Balance Assessment Berg Balance Test          Berg Balance Test    Sit to Stand Able to stand  independently using hands     Standing Unsupported Able to stand safely 2 minutes     Sitting with Back Unsupported but Feet Supported on Floor or Stool Able to sit safely and securely 2 minutes     Stand to Sit Controls descent by using hands     Transfers Able to transfer safely, definite need of hands     Standing Unsupported with Eyes Closed Able to stand 10 seconds safely     Standing Unsupported with Feet Together Able to place feet together independently and stand 1 minute safely     From Standing, Reach Forward with Outstretched Arm Can reach forward >12 cm safely (5")     From Standing Position, Pick up Object from Floor Able to pick up shoe safely and easily     From Standing Position, Turn to Look Behind Over each Shoulder Looks behind one side only/other side shows less weight shift     Turn 360 Degrees Able to turn 360 degrees safely but slowly     Standing Unsupported, Alternately Place Feet on Step/Stool Needs assistance to keep from falling or unable to try     Standing Unsupported,  One Foot in [place] to take small step independently and hold 30 seconds     Standing on One Leg Tries to lift leg/unable to hold 3 seconds but remains standing independently     Total Score 40     Berg Balance Scale: 01/30/22 score 25/56   [facility] PT Assessment - 01/30/22 1110                Standardized Balance Assessment    Standardized Balance Assessment Berg Balance Test          Berg Balance Test    Sit to Stand Able to stand using hands after several tries      Standing Unsupported Needs several tries to stand 30 seconds unsupported     Sitting with Back Unsupported but Feet Supported on Floor or Stool Able to sit safely and securely 2 minutes     Stand to Sit Controls descent by using hands     Transfers Able to transfer safely, definite need of hands     Standing Unsupported with Eyes Closed Unable to keep eyes closed 3 seconds but stays steady     Standing Unsupported with Feet Together Needs help to attain position but able to stand for 30 seconds with feet together     From Standing, Reach Forward with Outstretched Arm Can reach confidently >25 cm (10")     From Standing Position, Pick up Object from Floor Able to pick up shoe, needs supervision     From Standing Position, Turn to Look Behind Over each Shoulder Looks behind one side only/other side shows less weight shift     Turn 360 Degrees Needs assistance while turning     Standing Unsupported, Alternately Place Feet on Step/Stool Needs assistance to keep from falling or unable to try     Standing Unsupported, One Foot in [employer] balance while stepping or standing     Standing on One Leg Unable to try or needs assist to prevent fall     Total Score 25     Berg comment: BERG  < 36 high risk for falls (close to 100%) 46-51 moderate (>50%)   37-45 significant (>80%) 52-55 lower (> 25%)                 CURRENT PROSTHETIC WEAR ASSESSMENT: Patient is dependent with: skin check, residual limb care, care of non-amputated limb, prosthetic cleaning, ply sock cleaning, correct ply sock adjustment, proper wear schedule/adjustment, and proper weight-bearing schedule/adjustment Donning prosthesis: Mod A and requires instructions for fit, donning Doffing prosthesis: SBA Prosthetic wear tolerance: 2 hours/day for 2 days since delivery (Saturday, Sunday) Prosthetic weight bearing tolerance: up to ~8 minutes with intermittent UE support with no complaints of residual limb pain or issues Edema:  present Residual limb condition: one scab 74m on lateral incision, one open wound superficial 651mdistal tibia, limb is cylindrical with normal temperature and color Prosthetic description: silicone liner with pin lock suspension, total contact socket, dynamic response foot     TODAY'S TREATMENT:  04/11/2022 Prosthetic Training: PT instructed that socket should be snug with weight bearing / standing but is typically loose or able to move limb in socket when not weight bearing /seated. PT verbal cues on donning long pants that lower portion too tight to pull up.  Pt verbalized understanding of both.   Therapeutic Exercise: PT educated that well-rounded fitness plan includes exercises to address flexibility, strength, balance & endurance. PT recommending having one workout for [employer] and second for home.  PT instructed in setup & use of cardio equipment of recumbent stepper and bike with TTA prosthesis. Seated equipment has less intensity than standing.  Goal is to build up to >20-30 min of cardio.  PT recommended starting with seated machines and doing sets if needed. Pt used Nustep level 5 with BLEs & BUEs for 5 min and recumbent bike level 1 BLEs only for 3 min. PT instructed in how to determine weight with resistance machines and adjust for TTA prosthesis. PT recommending 2-3 sets of 10-15 reps. Working opposing muscle groups with ~5 lower body, 4-6 upper body & 1-2 trunk / abdominal.  Leg press BLEs 100# 15 reps  Seated chest press 20# 15 reps  Seated row 25# 15 reps  Knee ext 20# 15 reps  Knee flex / hamstring curl 25# 15 reps Pt verbalized & return demo understanding of above equipment and recommendations.   PT demo & verbal cues on use of Thera Kasandra Knudsen as option to work on trigger points.   04/04/2022 Neuromuscular Re-education See Merrilee Jansky Balance in objective 48/56 Counter top activities: alternate tapping toes in lower cabinet,  SLS with contralateral LE in lower cabinet & tandem  stance. HEP  At sink  Alternate tapping feet in lower cabinet. Balancing on one leg: place one forefoot in lower cabinet & hoover hands over counter while counting. Do 5 times on each leg. Stand with one foot in front of other count outloud while hoovering hands (stop counting if touch) goal is to get to 60.  Switch which leg is in front.     Therapeutic Activities: PT demo & verbal cues on getting on & off floor via half kneeling. Pt return demo & verbalized understanding difficult due to LE strength. Added to HEP Between 2 chairs facing each other Lunges alternating lead leg. Try to do 10 with each leg leading.  Prosthetic Training with Transtibial Amputation prosthesis: PT assessed residual limb that has callous at distal tibia.  PT recommended lotion at night with removal in morning prior to donning prosthesis. Pt verbalized understanding.   Therapeutic Exercise: Pt requested info on returning to [employer] esp row machine. Pt verbal instructed in getting on / off row machine. PT recommended 42mn sets initially to build overall tolerance.  Also PT instructed in set-up & use of recumbent stepper which is also available at [employer]  Pt verbalized understanding.   03/28/2022 Prosthetic Training with Transtibial Amputation prosthesis: Work on walking on uneven terrain with foam balance beam:  4 reps with narrow stance,  4 reps sidestepping. Walking on yoga mat that was on top on ankle weights 2 of each 1#,1.5#,2# to simulate uneven terrain 5 round trips with this Balance on BOSU blue side up with feet apart 1 min X 2 Stepping over foam beam without UE support leading with Rt LE, X 10 Walking up ramp X 2 with min A needed without SPC Walking up 6.5 inch step/curb CGA X2 FGA performed, see above for details.    HOME EXERCISE PROGRAM:   02/15/22 1214  PT Education  Education Details HEP at sink  Person(s) Educated Patient  Methods Explanation;Demonstration;Tactile  cues;Verbal cues;Handout  Comprehension Verbalized understanding;Returned demonstration;Verbal cues required;Tactile cues required    Do each exercise 1-2  times per day Do each exercise 5-10 repetitions Hold each exercise for 2 seconds to feel [place]  Try  to find this position when standing still for activities.   USE TAPE ON FLOOR TO MARK THE MIDLINE POSITION which is even with middle of sink.  You also should try to feel with your limb pressure in socket.  You are trying to feel with limb what you used to feel with the bottom of your foot.  Side to Side Shift: Moving your hips only (not shoulders): move weight onto your left leg, HOLD/FEEL pressure in socket.  Move back to equal weight on each leg, HOLD/FEEL pressure in socket. Move weight onto your right leg, HOLD/FEEL pressure in socket. Move back to equal weight on each leg, HOLD/FEEL pressure in socket. Repeat.  Start with both hands on sink, progress to hand on prosthetic side only, then no hands.  Front to Back Shift: Moving your hips only (not shoulders): move your weight forward onto your toes, HOLD/FEEL pressure in socket. Move your weight back to equal Flat Foot on both legs, HOLD/FEEL  pressure in socket. Move your weight back onto your heels, HOLD/FEEL  pressure in socket. Move your weight back to equal on both legs, HOLD/FEEL  pressure in socket. Repeat.  Start with both hands on sink, progress to hand on prosthetic side only, then no hands.  Moving Cones / Cups: With equal weight on each leg: Hold on with one hand the first time, then progress to no hand supports. Move cups from one side of sink to the other. Place cups ~2" out of your reach, progress to 10" beyond reach.  Place one hand in middle of sink and reach with other hand. Do both arms.  Then hover one hand and move cups with other hand.  Overhead/Upward Reaching: alternated reaching  up to top cabinets or ceiling if no cabinets present. Keep equal weight on each leg. Start with one hand support on counter while other hand reaches and progress to no hand support with reaching.  ace one hand in middle of sink and reach with other hand. Do both arms.  Then hover one hand and move cups with other hand.  5.   Looking Over Shoulders: With equal weight on each leg: alternate turning to look over your shoulders with one hand support on counter as needed.  Start with head motions only to look in front of shoulder, then even with shoulder and progress to looking behind you. To look to side, move head /eyes, then shoulder on side looking pulls back, shift more weight to side looking and pull hip back. Place one hand in middle of sink and let go with other hand so your shoulder can pull back. Switch hands to look other way.   Then hover one hand and look over shoulder. If looking right, use left hand at sink. If looking left, use right hand at sink. 6.  Stepping with leg that is not amputated:  Move items under cabinet out of your way. Shift your hips/pelvis so weight on prosthesis. Tighten muscles in hip on prosthetic side.  SLOWLY step other leg so front of foot is in cabinet. Then step back to floor.     ASSESSMENT:   CLINICAL IMPRESSION: Patient appears to understand how to return to exercising at [employer] based on PT instructions today.  Pt would benefit from using resistance & cardio equipment 2-3 days/wk and at home working on flexibility & balance.    OBJECTIVE IMPAIRMENTS Abnormal gait, decreased activity tolerance, decreased balance, decreased endurance, decreased knowledge of use of DME, decreased mobility, difficulty  walking, decreased strength, postural dysfunction, and prosthetic dependency .    ACTIVITY LIMITATIONS carrying, lifting, bending, sitting, standing, squatting, stairs, transfers, locomotion level, and caring for others   PARTICIPATION LIMITATIONS: meal prep,  cleaning, laundry, driving, community activity, and occupation   [place], Time since onset of injury/illness/exacerbation, and 3+ comorbidities: see PMH  are also affecting patient's functional outcome.    REHAB POTENTIAL: Good   CLINICAL DECISION MAKING: Evolving/moderate complexity   EVALUATION COMPLEXITY: Moderate     GOALS: Goals reviewed with patient? Yes  UPDATED SHORT TERM GOALS: Target date: 03/31/22  1. Patient ambulates 200' with prosthesis modified independent. Baseline: SEE OBJECTIVE DATA Goal status: MET 04/04/2022   2. Patient negotiates ramps & curbs with cane & prosthesis with modified independent Baseline: SEE OBJECTIVE DATA Goal status: MET 04/04/2022  3. Berg Balance task >/= 43/56 to indicate lower fall risk Baseline: SEE OBJECTIVE DATA Goal status: MET 04/04/2022   LONG TERM GOALS: Target date: 04/28/2022   Patient demonstrates & verbalizes understanding of prosthetic care to enable safe utilization of prosthesis. Baseline: SEE OBJECTIVE DATA Goal status: ongoing 03/14/2022   Patient tolerates prosthesis wear >90% of awake hours without skin or limb pain issues. Baseline: SEE OBJECTIVE DATA Goal status: ongoing 03/14/2022   Berg Balance task >/= 46/56 to indicate lower fall risk Baseline: SEE OBJECTIVE DATA Goal status: MET 04/04/2022   Patient safely ambulates >500' with prosthesis & cane or less independently. Baseline: SEE OBJECTIVE DATA Goal status: ongoing 03/14/2022   Patient negotiates ramps, curbs & stairs with single rail with prosthesis & cane or less independently. Baseline: SEE OBJECTIVE DATA Goal status: ongoing 03/14/2022   6.   Patient verbalizes & demonstrates work simulated tasks like lifting & carrying. Baseline: SEE OBJECTIVE DATA Goal status: ongoing 03/14/2022       PLAN: PT FREQUENCY: 1-2x/week   PT DURATION: 12 weeks   PLANNED INTERVENTIONS: Therapeutic exercises, Therapeutic activity, Neuromuscular  re-education, Balance training, Gait training, Patient/Family education, Self Care, Stair training, Prosthetic training, DME instructions, scar mobilization, Ultrasound, Ionotophoresis 56m/ml Dexamethasone, Manual therapy, Re-evaluation, and physical performance tests   PLAN FOR NEXT SESSION:   check [facility].  Instruct in treadmill use.   progress functional activities towards [place] PT, DPT 04/11/2022, 12:52 PM

## 2022-04-14 ENCOUNTER — Encounter: Payer: BC Managed Care – PPO | Admitting: Physical Medicine and Rehabilitation

## 2022-04-18 ENCOUNTER — Ambulatory Visit (INDEPENDENT_AMBULATORY_CARE_PROVIDER_SITE_OTHER): Payer: BC Managed Care – PPO | Admitting: Physical Therapy

## 2022-04-18 ENCOUNTER — Encounter: Payer: Self-pay | Admitting: Physical Therapy

## 2022-04-18 DIAGNOSIS — L98498 Non-pressure chronic ulcer of skin of other sites with other specified severity: Secondary | ICD-10-CM | POA: Diagnosis not present

## 2022-04-18 DIAGNOSIS — R2689 Other abnormalities of gait and mobility: Secondary | ICD-10-CM | POA: Diagnosis not present

## 2022-04-18 DIAGNOSIS — R293 Abnormal posture: Secondary | ICD-10-CM

## 2022-04-18 DIAGNOSIS — R2681 Unsteadiness on feet: Secondary | ICD-10-CM

## 2022-04-18 DIAGNOSIS — M6281 Muscle weakness (generalized): Secondary | ICD-10-CM | POA: Diagnosis not present

## 2022-04-18 NOTE — Therapy (Signed)
OUTPATIENT PHYSICAL THERAPY TREATMENT NOTE   Patient Name: Valerie Le MRN: [id number] DOB:06/19/1971, 51 y.o., female Today's Date: 04/18/2022  PCP: Willeen Niece, PA REFERRING PROVIDER: Newt Minion, MD  END OF SESSION:   PT End of Session - 04/18/22 1349     Visit Number 12    Number of Visits 25    Date for PT Re-Evaluation 04/28/22    Authorization Type BCBS    Authorization Time Period $50 co-pay    Progress Note Due on Visit 20    PT Start Time 1345    PT Stop Time 1423    PT Time Calculation (min) 38 min    Equipment Utilized During Treatment Gait belt    Activity Tolerance Patient tolerated treatment well    Behavior During Therapy WFL for tasks assessed/performed                  Past Medical History:  Diagnosis Date   Anemia    Asthma    as a child   Depression    Diabetes mellitus without complication ([place])    Type 1 - Has Insulin Pump   Headache    migraines   High cholesterol    Hypertension    Past Surgical History:  Procedure Laterality Date   AMPUTATION Right 12/22/2020   Procedure: RIGHT TRANSMETATARSAL AMPUTATION;  Surgeon: Newt Minion, MD;  Location: [place];  Service: Orthopedics;  Laterality: Right;   AMPUTATION Right 06/29/2021   Procedure: REVISION RIGHT TRANSMETATARSAL AMPUTATION;  Surgeon: Newt Minion, MD;  Location: [facility];  Service: Orthopedics;  Laterality: Right;   AMPUTATION Right 11/11/2021   Procedure: RIGHT BELOW KNEE AMPUTATION;  Surgeon: Newt Minion, MD;  Location: [facility];  Service: Orthopedics;  Laterality: Right;   AMPUTATION TOE Right 12/04/2020   Procedure: AMPUTATION RIGHT SECOND TOE, I&D FOOT;  Surgeon: Marybelle Killings, MD;  Location: [facility];  Service: Orthopedics;  Laterality: Right;  Right second toe amputation, I&D right foot.   APPLICATION OF WOUND VAC  11/11/2021   Procedure: APPLICATION OF WOUND VAC;  Surgeon: Newt Minion, MD;  Location: [facility];  Service: Orthopedics;;   birth control  implant  07/31/2006   Essure Implant   TONSILLECTOMY     TYMPANOSTOMY TUBE PLACEMENT     WISDOM TOOTH EXTRACTION     Patient Active Problem List   Diagnosis Date Noted   S/P BKA (below knee amputation), right ([place]) 11/16/2021   Below-knee amputation of right lower extremity ([place]) 11/11/2021   Hypoalbuminemia 11/03/2021   Non-pressure chronic ulcer of other part of right foot limited to breakdown of skin ([facility])    Chest pain 11/02/2021   Elevated troponin 11/02/2021   SIRS (systemic inflammatory response syndrome) ([place]) 11/02/2021   Diabetes mellitus type 1 ([place]) 11/02/2021   Thrombocytosis 11/02/2021   Microcytic hypochromic anemia 11/02/2021   Hypokalemia 11/02/2021   Morbid obesity ([place]) 11/02/2021   Medication monitoring encounter 06/15/2021   Smoking 02/01/2021   Acute hematogenous osteomyelitis of right foot ([place]) 12/22/2020   Osteomyelitis of foot, right, acute ([place])    Dehiscence of amputation stump ([place])    Diabetic foot infection ([place]) 12/02/2020   Essential hypertension 12/02/2020   Hyperlipidemia 08/21/2018   Bipolar affective disorder, currently depressed, moderate ([place]) 01/23/2017   Insulin-treated type 2 diabetes mellitus ([place]) 03/07/2013   Asthma 03/07/2013   ADHD (attention deficit hyperactivity disorder) 03/07/2013   Migraine 03/07/2013   Tobacco abuse 03/07/2013    REFERRING  DIAG: I29.798X (ICD-10-CM) - Below-knee amputation of right lower extremity ([place])    ONSET DATE: 01/27/2022 prosthesis delivery  THERAPY DIAG:  Other abnormalities of gait and mobility  Unsteadiness on feet  Muscle weakness (generalized)  Non-pressure chronic ulcer of skin of other sites with other specified severity ([place])  Abnormal posture  Rationale for Evaluation and Treatment Rehabilitation  PERTINENT HISTORY:  Rt TTA 11/11/21 due to osteomyelitis of Transmetatarsal Amputation, HTN, DM type 1, obesity, asthma as child, depression, HA migraines  PRECAUTIONS:  None  SUBJECTIVE:  She went to [employer] and used row machine. She had trouble getting off.  She lay on stomach and did prone press up. Her right anterior hip has been hurting since laying on stomach.    PAIN:  Are you having pain?  0/10 pain upon arrival   OBJECTIVE: (objective measures completed at initial evaluation unless otherwise dated)    COGNITION: Overall cognitive status: Within functional limits for tasks assessed              SENSATION: Not tested   POSTURE: rounded shoulders, forward head, and weight shift left   LOWER EXTREMITY ROM:   ROM Right eval Left eval  Hip flexion      Hip extension      Hip abduction      Hip adduction      Hip internal rotation      Hip external rotation      Knee flexion      Knee extension      Ankle dorsiflexion      Ankle plantarflexion      Ankle inversion      Ankle eversion       (Blank rows = not tested)   LOWER EXTREMITY MMT:   MMT Right eval Left eval  Hip flexion      Hip extension      Hip abduction      Hip adduction      Hip internal rotation      Hip external rotation      Knee flexion      Knee extension      Ankle dorsiflexion      Ankle plantarflexion      Ankle inversion      Ankle eversion      (Blank rows = not tested)   TRANSFERS: 02/22/22 STS with SBA on chair without armrests, uses BUE and stabilizes without UE support  01/30/22 Sit to stand: SBA with verbal cueing for scooting forward in chair, used BUE to stand but could stabilize without UE support on RW Stand to sit: SBA, required BUE support to control descent   GAIT: 03/07/22 Pt amb in clinic without AD independently with Rt hip hike and abduction for foot clearance, requires minA for ramp and curb negotiation  01/30/22 Gait pattern: step to pattern, decreased step length- Left, decreased stance time- Right, decreased stride length, decreased hip/knee flexion- Right, Right hip hike, knee flexed in stance- Right, antalgic, narrow  BOS, and abducted- Right Distance walked: 30' Assistive device utilized: Environmental consultant - 2 wheeled Level of assistance: SBA Gait velocity: 1.10 ft/sec Comments: excessive weight bearing BUEs on RW and partial weight on prosthesis   FUNCTIONAL TESTs:  04/04/2022  Berg Balance  48/56     FGA 03/28/22  13/30   [facility] PT Assessment - 03/28/22 0001                Functional Gait  Assessment    Gait  assessed  Yes     Gait Level Surface Walks 20 ft in less than 7 sec but greater than 5.5 sec, uses assistive device, slower speed, mild gait deviations, or deviates 6-10 in outside of the 12 in walkway width.     Change in Gait Speed Able to change speed, demonstrates mild gait deviations, deviates 6-10 in outside of the 12 in walkway width, or no gait deviations, unable to achieve a major change in velocity, or uses a change in velocity, or uses an assistive device.     Gait with Horizontal Head Turns Performs head turns smoothly with slight change in gait velocity (eg, minor disruption to smooth gait path), deviates 6-10 in outside 12 in walkway width, or uses an assistive device.     Gait with Vertical Head Turns Performs task with moderate change in gait velocity, slows down, deviates 10-15 in outside 12 in walkway width but recovers, can continue to walk.     Gait and Pivot Turn Pivot turns safely in greater than 3 sec and stops with no loss of balance, or pivot turns safely within 3 sec and stops with mild imbalance, requires small steps to catch balance.     Step Over Obstacle Is able to step over one shoe box (4.5 in total height) but must slow down and adjust steps to clear box safely. May require verbal cueing.     Gait with Narrow Base of Support Ambulates less than 4 steps heel to toe or cannot perform without assistance.     Gait with Eyes Closed Walks 20 ft, slow speed, abnormal gait pattern, evidence for imbalance, deviates 10-15 in outside 12 in walkway width. Requires more than 9 sec to  ambulate 20 ft.     Ambulating Backwards Walks 20 ft, slow speed, abnormal gait pattern, evidence for imbalance, deviates 10-15 in outside 12 in walkway width.     Steps Two feet to a stair, must use rail.     Total Score 13      Berg Balance Scale: 02/28/22 score 40/56   [facility] PT Assessment - 02/28/22 1414                Standardized Balance Assessment    Standardized Balance Assessment Berg Balance Test          Berg Balance Test    Sit to Stand Able to stand  independently using hands     Standing Unsupported Able to stand safely 2 minutes     Sitting with Back Unsupported but Feet Supported on Floor or Stool Able to sit safely and securely 2 minutes     Stand to Sit Controls descent by using hands     Transfers Able to transfer safely, definite need of hands     Standing Unsupported with Eyes Closed Able to stand 10 seconds safely     Standing Unsupported with Feet Together Able to place feet together independently and stand 1 minute safely     From Standing, Reach Forward with Outstretched Arm Can reach forward >12 cm safely (5")     From Standing Position, Pick up Object from Floor Able to pick up shoe safely and easily     From Standing Position, Turn to Look Behind Over each Shoulder Looks behind one side only/other side shows less weight shift     Turn 360 Degrees Able to turn 360 degrees safely but slowly     Standing Unsupported, Alternately Place Feet on Step/Stool Needs assistance to  keep from falling or unable to try     Standing Unsupported, One Foot in [place] to take small step independently and hold 30 seconds     Standing on One Leg Tries to lift leg/unable to hold 3 seconds but remains standing independently     Total Score 40     Berg Balance Scale: 01/30/22 score 25/56   [facility] PT Assessment - 01/30/22 1110                Standardized Balance Assessment    Standardized Balance Assessment Berg Balance Test          Berg Balance Test    Sit to Stand Able  to stand using hands after several tries     Standing Unsupported Needs several tries to stand 30 seconds unsupported     Sitting with Back Unsupported but Feet Supported on Floor or Stool Able to sit safely and securely 2 minutes     Stand to Sit Controls descent by using hands     Transfers Able to transfer safely, definite need of hands     Standing Unsupported with Eyes Closed Unable to keep eyes closed 3 seconds but stays steady     Standing Unsupported with Feet Together Needs help to attain position but able to stand for 30 seconds with feet together     From Standing, Reach Forward with Outstretched Arm Can reach confidently >25 cm (10")     From Standing Position, Pick up Object from Floor Able to pick up shoe, needs supervision     From Standing Position, Turn to Look Behind Over each Shoulder Looks behind one side only/other side shows less weight shift     Turn 360 Degrees Needs assistance while turning     Standing Unsupported, Alternately Place Feet on Step/Stool Needs assistance to keep from falling or unable to try     Standing Unsupported, One Foot in [employer] balance while stepping or standing     Standing on One Leg Unable to try or needs assist to prevent fall     Total Score 25     Berg comment: BERG  < 36 high risk for falls (close to 100%) 46-51 moderate (>50%)   37-45 significant (>80%) 52-55 lower (> 25%)                 CURRENT PROSTHETIC WEAR ASSESSMENT: Patient is dependent with: skin check, residual limb care, care of non-amputated limb, prosthetic cleaning, ply sock cleaning, correct ply sock adjustment, proper wear schedule/adjustment, and proper weight-bearing schedule/adjustment Donning prosthesis: Mod A and requires instructions for fit, donning Doffing prosthesis: SBA Prosthetic wear tolerance: 2 hours/day for 2 days since delivery (Saturday, Sunday) Prosthetic weight bearing tolerance: up to ~8 minutes with intermittent UE support with no complaints  of residual limb pain or issues Edema: present Residual limb condition: one scab 47m on lateral incision, one open wound superficial 643mdistal tibia, limb is cylindrical with normal temperature and color Prosthetic description: silicone liner with pin lock suspension, total contact socket, dynamic response foot     TODAY'S TREATMENT:  04/18/2022 Therapeutic Exercise: PT demo & verbal cues on hip flexor stretch.  Initially supine hooklying with RLE over edge 30 sec hold.  [facility] with double knee to chest to minimize lordosis.  Pt performed 2 reps ea LE 30 sec hold.    Therapeutic Activities: PT demo & verbal cues on sit to supine via sidelying. Pt return demo  understanding.   Prosthetic Training with Right TTA Prosthesis: PT reviewed adjusting ply socks even with extensive pads added by prosthetist just prior to PT. PT educated on residual limb pain vs phantom sensation vs phantom pain.  PT recommending wearing shrinker overnight to manage fluid retention when out of prosthesis better. PT recommended asking prosthetist for smaller shrinker to fit her current size.  PT instructed in making fitted footed pajamas for RLE to help pain at night. Pt verbalized understanding.   04/11/2022 Prosthetic Training: PT instructed that socket should be snug with weight bearing / standing but is typically loose or able to move limb in socket when not weight bearing /seated. PT verbal cues on donning long pants that lower portion too tight to pull up.  Pt verbalized understanding of both.   Therapeutic Exercise: PT educated that well-rounded fitness plan includes exercises to address flexibility, strength, balance & endurance. PT recommending having one workout for [employer] and second for home.  PT instructed in setup & use of cardio equipment of recumbent stepper and bike with TTA prosthesis. Seated equipment has less intensity than standing.  Goal is to build up to >20-30 min  of cardio.  PT recommended starting with seated machines and doing sets if needed. Pt used Nustep level 5 with BLEs & BUEs for 5 min and recumbent bike level 1 BLEs only for 3 min. PT instructed in how to determine weight with resistance machines and adjust for TTA prosthesis. PT recommending 2-3 sets of 10-15 reps. Working opposing muscle groups with ~5 lower body, 4-6 upper body & 1-2 trunk / abdominal.  Leg press BLEs 100# 15 reps  Seated chest press 20# 15 reps  Seated row 25# 15 reps  Knee ext 20# 15 reps  Knee flex / hamstring curl 25# 15 reps Pt verbalized & return demo understanding of above equipment and recommendations.   PT demo & verbal cues on use of Thera Kasandra Knudsen as option to work on trigger points.   04/04/2022 Neuromuscular Re-education See Merrilee Jansky Balance in objective 48/56 Counter top activities: alternate tapping toes in lower cabinet,  SLS with contralateral LE in lower cabinet & tandem stance. HEP  At sink  Alternate tapping feet in lower cabinet. Balancing on one leg: place one forefoot in lower cabinet & hoover hands over counter while counting. Do 5 times on each leg. Stand with one foot in front of other count outloud while hoovering hands (stop counting if touch) goal is to get to 60.  Switch which leg is in front.     Therapeutic Activities: PT demo & verbal cues on getting on & off floor via half kneeling. Pt return demo & verbalized understanding difficult due to LE strength. Added to HEP Between 2 chairs facing each other Lunges alternating lead leg. Try to do 10 with each leg leading.  Prosthetic Training with Transtibial Amputation prosthesis: PT assessed residual limb that has callous at distal tibia.  PT recommended lotion at night with removal in morning prior to donning prosthesis. Pt verbalized understanding.   Therapeutic Exercise: Pt requested info on returning to [employer] esp row machine. Pt verbal instructed in getting on / off row machine. PT  recommended 24mn sets initially to build overall tolerance.  Also PT instructed in set-up & use of recumbent stepper which is also available at [employer]  Pt verbalized understanding.     HOME EXERCISE PROGRAM:   02/15/22 1214  PT Education  Education Details HEP at sink  Person(s) Educated Patient  Methods Explanation;Demonstration;Tactile cues;Verbal cues;Handout  Comprehension Verbalized understanding;Returned demonstration;Verbal cues required;Tactile cues required    Do each exercise 1-2  times per day Do each exercise 5-10 repetitions Hold each exercise for 2 seconds to feel your location  AT [place].  Try to find this position when standing still for activities.   USE TAPE ON FLOOR TO MARK THE MIDLINE POSITION which is even with middle of sink.  You also should try to feel with your limb pressure in socket.  You are trying to feel with limb what you used to feel with the bottom of your foot.  Side to Side Shift: Moving your hips only (not shoulders): move weight onto your left leg, HOLD/FEEL pressure in socket.  Move back to equal weight on each leg, HOLD/FEEL pressure in socket. Move weight onto your right leg, HOLD/FEEL pressure in socket. Move back to equal weight on each leg, HOLD/FEEL pressure in socket. Repeat.  Start with both hands on sink, progress to hand on prosthetic side only, then no hands.  Front to Back Shift: Moving your hips only (not shoulders): move your weight forward onto your toes, HOLD/FEEL pressure in socket. Move your weight back to equal Flat Foot on both legs, HOLD/FEEL  pressure in socket. Move your weight back onto your heels, HOLD/FEEL  pressure in socket. Move your weight back to equal on both legs, HOLD/FEEL  pressure in socket. Repeat.  Start with both hands on sink, progress to hand on prosthetic side only, then no hands.  Moving Cones / Cups: With equal weight on each leg:  Hold on with one hand the first time, then progress to no hand supports. Move cups from one side of sink to the other. Place cups ~2" out of your reach, progress to 10" beyond reach.  Place one hand in middle of sink and reach with other hand. Do both arms.  Then hover one hand and move cups with other hand.  Overhead/Upward Reaching: alternated reaching up to top cabinets or ceiling if no cabinets present. Keep equal weight on each leg. Start with one hand support on counter while other hand reaches and progress to no hand support with reaching.  ace one hand in middle of sink and reach with other hand. Do both arms.  Then hover one hand and move cups with other hand.  5.   Looking Over Shoulders: With equal weight on each leg: alternate turning to look over your shoulders with one hand support on counter as needed.  Start with head motions only to look in front of shoulder, then even with shoulder and progress to looking behind you. To look to side, move head /eyes, then shoulder on side looking pulls back, shift more weight to side looking and pull hip back. Place one hand in middle of sink and let go with other hand so your shoulder can pull back. Switch hands to look other way.   Then hover one hand and look over shoulder. If looking right, use left hand at sink. If looking left, use right hand at sink. 6.  Stepping with leg that is not amputated:  Move items under cabinet out of your way. Shift your hips/pelvis so weight on prosthesis. Tighten muscles in hip on prosthetic side.  SLOWLY step other leg so front of foot is in cabinet. Then step back to floor.     ASSESSMENT:   CLINICAL IMPRESSION:  Pt appears to understand how to stretch hip flexors that she seemed to irritate with prone press ups.  She has better understanding of limb pain vs phantom sensation vs phantom pain and basic management.  PT anticipates meeting LTGs next week.    OBJECTIVE IMPAIRMENTS Abnormal gait, decreased activity  tolerance, decreased balance, decreased endurance, decreased knowledge of use of DME, decreased mobility, difficulty walking, decreased strength, postural dysfunction, and prosthetic dependency .    ACTIVITY LIMITATIONS carrying, lifting, bending, sitting, standing, squatting, stairs, transfers, locomotion level, and caring for others   PARTICIPATION LIMITATIONS: meal prep, cleaning, laundry, driving, community activity, and occupation   [place], Time since onset of injury/illness/exacerbation, and 3+ comorbidities: see PMH  are also affecting patient's functional outcome.    REHAB POTENTIAL: Good   CLINICAL DECISION MAKING: Evolving/moderate complexity   EVALUATION COMPLEXITY: Moderate     GOALS: Goals reviewed with patient? Yes  UPDATED SHORT TERM GOALS: Target date: 03/31/22  1. Patient ambulates 200' with prosthesis modified independent. Baseline: SEE OBJECTIVE DATA Goal status: MET 04/04/2022   2. Patient negotiates ramps & curbs with cane & prosthesis with modified independent Baseline: SEE OBJECTIVE DATA Goal status: MET 04/04/2022  3. Berg Balance task >/= 43/56 to indicate lower fall risk Baseline: SEE OBJECTIVE DATA Goal status: MET 04/04/2022   LONG TERM GOALS: Target date: 04/28/2022   Patient demonstrates & verbalizes understanding of prosthetic care to enable safe utilization of prosthesis. Baseline: SEE OBJECTIVE DATA Goal status: ongoing 03/14/2022   Patient tolerates prosthesis wear >90% of awake hours without skin or limb pain issues. Baseline: SEE OBJECTIVE DATA Goal status: ongoing 03/14/2022   Berg Balance task >/= 46/56 to indicate lower fall risk Baseline: SEE OBJECTIVE DATA Goal status: MET 04/04/2022   Patient safely ambulates >500' with prosthesis & cane or less independently. Baseline: SEE OBJECTIVE DATA Goal status: ongoing 03/14/2022   Patient negotiates ramps, curbs & stairs with single rail with prosthesis & cane or less  independently. Baseline: SEE OBJECTIVE DATA Goal status: ongoing 03/14/2022   6.   Patient verbalizes & demonstrates work simulated tasks like lifting & carrying. Baseline: SEE OBJECTIVE DATA Goal status: ongoing 03/14/2022       PLAN: PT FREQUENCY: 1-2x/week   PT DURATION: 12 weeks   PLANNED INTERVENTIONS: Therapeutic exercises, Therapeutic activity, Neuromuscular re-education, Balance training, Gait training, Patient/Family education, Self Care, Stair training, Prosthetic training, DME instructions, scar mobilization, Ultrasound, Ionotophoresis 83m/ml Dexamethasone, Manual therapy, Re-evaluation, and physical performance tests   PLAN FOR NEXT SESSION:  check LTGs with probable discharge   RJamey Reas PT, DPT 04/18/2022, 2:28 PM

## 2022-04-25 ENCOUNTER — Ambulatory Visit (INDEPENDENT_AMBULATORY_CARE_PROVIDER_SITE_OTHER): Payer: BC Managed Care – PPO | Admitting: Physical Therapy

## 2022-04-25 ENCOUNTER — Encounter: Payer: Self-pay | Admitting: Physical Therapy

## 2022-04-25 DIAGNOSIS — R2689 Other abnormalities of gait and mobility: Secondary | ICD-10-CM

## 2022-04-25 DIAGNOSIS — R2681 Unsteadiness on feet: Secondary | ICD-10-CM | POA: Diagnosis not present

## 2022-04-25 DIAGNOSIS — M6281 Muscle weakness (generalized): Secondary | ICD-10-CM | POA: Diagnosis not present

## 2022-04-25 NOTE — Therapy (Signed)
OUTPATIENT PHYSICAL THERAPY TREATMENT NOTE & DISCHARGE SUMMARY   Patient Name: Valerie Le MRN: [id number] DOB:06-Jul-1970, 51 y.o., female Today's Date: 04/25/2022  PCP: Willeen Niece, PA REFERRING PROVIDER: Newt Minion, MD  PHYSICAL THERAPY DISCHARGE SUMMARY  Visits from Start of Care: 13  Current functional level related to goals / functional outcomes: See below   Remaining deficits: See below   Education / Equipment: Patient was instructed in prosthetic care & HEP including use of fitness equipment to return to gym.  She appears to understand both.    Patient agrees to discharge. Patient goals were met. Patient is being discharged due to meeting the stated rehab goals.   END OF SESSION:   PT End of Session - 04/25/22 1106     Visit Number 13    Number of Visits 25    Date for PT Re-Evaluation 04/28/22    Authorization Type [employer]    Authorization Time Period $50 co-pay    Progress Note Due on Visit 20    PT Start Time 1105    Equipment Utilized During Treatment Gait belt    Activity Tolerance Patient tolerated treatment well    Behavior During Therapy WFL for tasks assessed/performed                  Past Medical History:  Diagnosis Date   Anemia    Asthma    as a child   Depression    Diabetes mellitus without complication ([place])    Type 1 - Has Insulin Pump   Headache    migraines   High cholesterol    Hypertension    Past Surgical History:  Procedure Laterality Date   AMPUTATION Right 12/22/2020   Procedure: RIGHT TRANSMETATARSAL AMPUTATION;  Surgeon: Newt Minion, MD;  Location: [place];  Service: Orthopedics;  Laterality: Right;   AMPUTATION Right 06/29/2021   Procedure: REVISION RIGHT TRANSMETATARSAL AMPUTATION;  Surgeon: Newt Minion, MD;  Location: [place];  Service: Orthopedics;  Laterality: Right;   AMPUTATION Right 11/11/2021   Procedure: RIGHT BELOW KNEE AMPUTATION;  Surgeon: Newt Minion, MD;  Location: [place];   Service: Orthopedics;  Laterality: Right;   AMPUTATION TOE Right 12/04/2020   Procedure: AMPUTATION RIGHT SECOND TOE, I&D FOOT;  Surgeon: Marybelle Killings, MD;  Location: [facility];  Service: Orthopedics;  Laterality: Right;  Right second toe amputation, I&D right foot.   APPLICATION OF WOUND VAC  11/11/2021   Procedure: APPLICATION OF WOUND VAC;  Surgeon: Newt Minion, MD;  Location: [place];  Service: Orthopedics;;   birth control implant  07/31/2006   Essure Implant   TONSILLECTOMY     TYMPANOSTOMY TUBE PLACEMENT     WISDOM TOOTH EXTRACTION     Patient Active Problem List   Diagnosis Date Noted   S/P BKA (below knee amputation), right ([place]) 11/16/2021   Below-knee amputation of right lower extremity ([place]) 11/11/2021   Hypoalbuminemia 11/03/2021   Non-pressure chronic ulcer of other part of right foot limited to breakdown of skin ([facility])    Chest pain 11/02/2021   Elevated troponin 11/02/2021   SIRS (systemic inflammatory response syndrome) ([place]) 11/02/2021   Diabetes mellitus type 1 ([place]) 11/02/2021   Thrombocytosis 11/02/2021   Microcytic hypochromic anemia 11/02/2021   Hypokalemia 11/02/2021   Morbid obesity ([place]) 11/02/2021   Medication monitoring encounter 06/15/2021   Smoking 02/01/2021   Acute hematogenous osteomyelitis of right foot ([place]) 12/22/2020   Osteomyelitis of foot, right, acute ([place])  Dehiscence of amputation stump ([facility])    Diabetic foot infection ([place]) 12/02/2020   Essential hypertension 12/02/2020   Hyperlipidemia 08/21/2018   Bipolar affective disorder, currently depressed, moderate ([place]) 01/23/2017   Insulin-treated type 2 diabetes mellitus ([place]) 03/07/2013   Asthma 03/07/2013   ADHD (attention deficit hyperactivity disorder) 03/07/2013   Migraine 03/07/2013   Tobacco abuse 03/07/2013    REFERRING DIAG: N00.370W (ICD-10-CM) - Below-knee amputation of right lower extremity ([place])    ONSET DATE: 01/27/2022 prosthesis delivery  THERAPY DIAG:  Other  abnormalities of gait and mobility  Unsteadiness on feet  Muscle weakness (generalized)  Rationale for Evaluation and Treatment Rehabilitation  PERTINENT HISTORY:  Rt TTA 11/11/21 due to osteomyelitis of Transmetatarsal Amputation, HTN, DM type 1, obesity, asthma as child, depression, HA migraines  PRECAUTIONS: None  SUBJECTIVE:  she is wearing prosthesis all awake hours without issues. She has found no issues with return to work.   PAIN:  Are you having pain?   0/10    OBJECTIVE: (objective measures completed at initial evaluation unless otherwise dated)   POSTURE: rounded shoulders, forward head, and weight shift left   TRANSFERS: 04/25/2022 Sit to/from stand requires use of single UE from 18" chair and no UE assist from 20" chair. No stabilization needed.   02/22/22 STS with SBA on chair without armrests, uses BUE and stabilizes without UE support  01/30/22 Sit to stand: SBA with verbal cueing for scooting forward in chair, used BUE to stand but could stabilize without UE support on RW Stand to sit: SBA, required BUE support to control descent   GAIT: 04/25/2022:  Pt amb >500' with prosthesis only independently. See FGA below for advanced gait functions. Pt neg ramp & curb without device except TTA prosthesis modified (increased time) independent.  Pt neg stairs with single rail alternating pattern 11 steps modified independent.  Gait Velocity: 3.72 ft/sec comfortable pace & 5.00 ft/sec fast pace  03/07/22 Pt amb in clinic without AD independently with Rt hip hike and abduction for foot clearance, requires minA for ramp and curb negotiation  01/30/22 Gait pattern: step to pattern, decreased step length- Left, decreased stance time- Right, decreased stride length, decreased hip/knee flexion- Right, Right hip hike, knee flexed in stance- Right, antalgic, narrow BOS, and abducted- Right Distance walked: 30' Assistive device utilized: Environmental consultant - 2 wheeled Level of assistance:  SBA Gait velocity: 1.10 ft/sec Comments: excessive weight bearing BUEs on RW and partial weight on prosthesis   FUNCTIONAL TESTs:  04/25/2022:  Merrilee Jansky Balance 50/56 & Functional Gait Assessment 23/30  [facility] PT Assessment - 04/25/22 1105       Standardized Balance Assessment   Standardized Balance Assessment Berg Balance Test      Berg Balance Test   Sit to Stand Able to stand  independently using hands    Standing Unsupported Able to stand safely 2 minutes    Sitting with Back Unsupported but Feet Supported on Floor or Stool Able to sit safely and securely 2 minutes    Stand to Sit Sits safely with minimal use of hands    Transfers Able to transfer safely, minor use of hands    Standing Unsupported with Eyes Closed Able to stand 10 seconds safely    Standing Unsupported with Feet Together Able to place feet together independently and stand 1 minute safely    From Standing, Reach Forward with Outstretched Arm Can reach confidently >25 cm (10")    From Standing Position, Pick up Object from Floor Able  to pick up shoe safely and easily    From Standing Position, Turn to Look Behind Over each Shoulder Looks behind from both sides and weight shifts well    Turn 360 Degrees Able to turn 360 degrees safely in 4 seconds or less    Standing Unsupported, Alternately Place Feet on Step/Stool Able to stand independently and complete 8 steps >20 seconds    Standing Unsupported, One Foot in Front Able to plae foot ahead of the other independently and hold 30 seconds    Standing on One Leg Tries to lift leg/unable to hold 3 seconds but remains standing independently    Total Score 50      Functional Gait  Assessment   Gait assessed  Yes    Gait Level Surface Walks 20 ft in less than 5.5 sec, no assistive devices, good speed, no evidence for imbalance, normal gait pattern, deviates no more than 6 in outside of the 12 in walkway width.    Change in Gait Speed Able to smoothly change walking speed without  loss of balance or gait deviation. Deviate no more than 6 in outside of the 12 in walkway width.    Gait with Horizontal Head Turns Performs head turns smoothly with no change in gait. Deviates no more than 6 in outside 12 in walkway width    Gait with Vertical Head Turns Performs head turns with no change in gait. Deviates no more than 6 in outside 12 in walkway width.    Gait and Pivot Turn Pivot turns safely within 3 sec and stops quickly with no loss of balance.    Step Over Obstacle Is able to step over one shoe box (4.5 in total height) without changing gait speed. No evidence of imbalance.    Gait with Narrow Base of Support Ambulates less than 4 steps heel to toe or cannot perform without assistance.    Gait with Eyes Closed Walks 20 ft, uses assistive device, slower speed, mild gait deviations, deviates 6-10 in outside 12 in walkway width. Ambulates 20 ft in less than 9 sec but greater than 7 sec.    Ambulating Backwards Walks 20 ft, uses assistive device, slower speed, mild gait deviations, deviates 6-10 in outside 12 in walkway width.    Steps Alternating feet, must use rail.    Total Score 23    FGA comment: 23/30 lower fall risk as >19/30,   on 03/28/22 was 13/30              04/04/2022  Berg Balance  48/56 and FGA 03/28/22  13/30  Berg Balance Scale: 02/28/22 score 40/56  Berg Balance Scale: 01/30/22 score 25/56     CURRENT PROSTHETIC WEAR ASSESSMENT: 04/25/2022: Patient is independent with: skin check, residual limb care, care of non-amputated limb, prosthetic cleaning, ply sock cleaning, correct ply sock adjustment, proper wear schedule/adjustment, and proper weight-bearing schedule/adjustment Donning prosthesis: independent Doffing prosthesis: independent Prosthetic wear tolerance:  >90% of awake hours daily with no skin issues & no pain. Her limb is completely healed with no issues.  Prosthetic weight bearing tolerance: >15 min with no limb pain or issues.   01/30/2022:   Patient is dependent with: skin check, residual limb care, care of non-amputated limb, prosthetic cleaning, ply sock cleaning, correct ply sock adjustment, proper wear schedule/adjustment, and proper weight-bearing schedule/adjustment Donning prosthesis: Mod A and requires instructions for fit, donning Doffing prosthesis: SBA Prosthetic wear tolerance: 2 hours/day for 2 days since delivery (Saturday, Sunday) Prosthetic weight  bearing tolerance: up to ~8 minutes with intermittent UE support with no complaints of residual limb pain or issues Edema: present Residual limb condition: one scab 40m on lateral incision, one open wound superficial 61mdistal tibia, limb is cylindrical with normal temperature and color Prosthetic description: silicone liner with pin lock suspension, total contact socket, dynamic response foot     TODAY'S TREATMENT:  04/25/2022 See objective data above.  Pt able to lift & carry items safely.   04/18/2022 Therapeutic Exercise: PT demo & verbal cues on hip flexor stretch.  Initially supine hooklying with RLE over edge 30 sec hold.  Progressed to [facility] with double knee to chest to minimize lordosis.  Pt performed 2 reps ea LE 30 sec hold.    Therapeutic Activities: PT demo & verbal cues on sit to supine via sidelying. Pt return demo understanding.   Prosthetic Training with Right TTA Prosthesis: PT reviewed adjusting ply socks even with extensive pads added by prosthetist just prior to PT. PT educated on residual limb pain vs phantom sensation vs phantom pain.  PT recommending wearing shrinker overnight to manage fluid retention when out of prosthesis better. PT recommended asking prosthetist for smaller shrinker to fit her current size.  PT instructed in making fitted footed pajamas for RLE to help pain at night. Pt verbalized understanding.   04/11/2022 Prosthetic Training: PT instructed that socket should be snug with weight bearing / standing but is  typically loose or able to move limb in socket when not weight bearing /seated. PT verbal cues on donning long pants that lower portion too tight to pull up.  Pt verbalized understanding of both.   Therapeutic Exercise: PT educated that well-rounded fitness plan includes exercises to address flexibility, strength, balance & endurance. PT recommending having one workout for [employer] second for home.  PT instructed in setup & use of cardio equipment of recumbent stepper and bike with TTA prosthesis. Seated equipment has less intensity than standing.  Goal is to build up to >20-30 min of cardio.  PT recommended starting with seated machines and doing sets if needed. Pt used Nustep level 5 with BLEs & BUEs for 5 min and recumbent bike level 1 BLEs only for 3 min. PT instructed in how to determine weight with resistance machines and adjust for TTA prosthesis. PT recommending 2-3 sets of 10-15 reps. Working opposing muscle groups with ~5 lower body, 4-6 upper body & 1-2 trunk / abdominal.  Leg press BLEs 100# 15 reps  Seated chest press 20# 15 reps  Seated row 25# 15 reps  Knee ext 20# 15 reps  Knee flex / hamstring curl 25# 15 reps Pt verbalized & return demo understanding of above equipment and recommendations.   PT demo & verbal cues on use of Thera CaKasandra Knudsens option to work on trigger points.    HOME EXERCISE PROGRAM:   02/15/22 1214  PT Education  Education Details HEP at sink  Person(s) Educated Patient  Methods Explanation;Demonstration;Tactile cues;Verbal cues;Handout  Comprehension Verbalized understanding;Returned demonstration;Verbal cues required;Tactile cues required    Do each exercise 1-2  times per day Do each exercise 5-10 repetitions Hold each exercise for 2 seconds to feel your location  [place] Try to find this position when standing still for activities.   USE TAPE ON FLOOR TO MARK THE  MIDLINE POSITION which is even with middle of sink.  You also should  try to feel with your limb pressure in socket.  You are trying to feel with limb what you used to feel with the bottom of your foot.  Side to Side Shift: Moving your hips only (not shoulders): move weight onto your left leg, HOLD/FEEL pressure in socket.  Move back to equal weight on each leg, HOLD/FEEL pressure in socket. Move weight onto your right leg, HOLD/FEEL pressure in socket. Move back to equal weight on each leg, HOLD/FEEL pressure in socket. Repeat.  Start with both hands on sink, progress to hand on prosthetic side only, then no hands.  Front to Back Shift: Moving your hips only (not shoulders): move your weight forward onto your toes, HOLD/FEEL pressure in socket. Move your weight back to equal Flat Foot on both legs, HOLD/FEEL  pressure in socket. Move your weight back onto your heels, HOLD/FEEL  pressure in socket. Move your weight back to equal on both legs, HOLD/FEEL  pressure in socket. Repeat.  Start with both hands on sink, progress to hand on prosthetic side only, then no hands.  Moving Cones / Cups: With equal weight on each leg: Hold on with one hand the first time, then progress to no hand supports. Move cups from one side of sink to the other. Place cups ~2" out of your reach, progress to 10" beyond reach.  Place one hand in middle of sink and reach with other hand. Do both arms.  Then hover one hand and move cups with other hand.  Overhead/Upward Reaching: alternated reaching up to top cabinets or ceiling if no cabinets present. Keep equal weight on each leg. Start with one hand support on counter while other hand reaches and progress to no hand support with reaching.  ace one hand in middle of sink and reach with other hand. Do both arms.  Then hover one hand and move cups with other hand.  5.   Looking Over Shoulders: With equal weight on each leg: alternate turning to look over your shoulders with one hand  support on counter as needed.  Start with head motions only to look in front of shoulder, then even with shoulder and progress to looking behind you. To look to side, move head /eyes, then shoulder on side looking pulls back, shift more weight to side looking and pull hip back. Place one hand in middle of sink and let go with other hand so your shoulder can pull back. Switch hands to look other way.   Then hover one hand and look over shoulder. If looking right, use left hand at sink. If looking left, use right hand at sink. 6.  Stepping with leg that is not amputated:  Move items under cabinet out of your way. Shift your hips/pelvis so weight on prosthesis. Tighten muscles in hip on prosthetic side.  SLOWLY step other leg so front of foot is in cabinet. Then step back to floor.     ASSESSMENT:   CLINICAL IMPRESSION: Patient met all LTGs and appears to functioning at community level with prosthesis safely.  She reports return to work with no issues.   OBJECTIVE IMPAIRMENTS Abnormal gait, decreased activity tolerance, decreased balance, decreased endurance, decreased knowledge of use of DME, decreased mobility, difficulty walking, decreased strength, postural dysfunction, and prosthetic dependency .    ACTIVITY LIMITATIONS carrying, lifting, bending, sitting, standing, squatting, stairs, transfers, locomotion level, and caring for others   PARTICIPATION LIMITATIONS: meal prep, cleaning, laundry, driving, community activity, and occupation   [place],  Time since onset of injury/illness/exacerbation, and 3+ comorbidities: see PMH  are also affecting patient's functional outcome.    REHAB POTENTIAL: Good   CLINICAL DECISION MAKING: Evolving/moderate complexity   EVALUATION COMPLEXITY: Moderate     GOALS: Goals reviewed with patient? Yes  UPDATED SHORT TERM GOALS: Target date: 03/31/22  1. Patient ambulates 200' with prosthesis modified independent. Baseline: SEE OBJECTIVE  DATA Goal status: MET 04/04/2022   2. Patient negotiates ramps & curbs with cane & prosthesis with modified independent Baseline: SEE OBJECTIVE DATA Goal status: MET 04/04/2022  3. Berg Balance task >/= 43/56 to indicate lower fall risk Baseline: SEE OBJECTIVE DATA Goal status: MET 04/04/2022   LONG TERM GOALS: Target date: 04/28/2022   Patient demonstrates & verbalizes understanding of prosthetic care to enable safe utilization of prosthesis. Baseline: SEE OBJECTIVE DATA Goal status: MET 04/25/2022   Patient tolerates prosthesis wear >90% of awake hours without skin or limb pain issues. Baseline: SEE OBJECTIVE DATA Goal status: MET 04/25/2022   Berg Balance task >/= 46/56 to indicate lower fall risk Baseline: SEE OBJECTIVE DATA Goal status: MET 04/04/2022   Patient safely ambulates >500' with prosthesis & cane or less independently. Baseline: SEE OBJECTIVE DATA Goal status: MET 04/25/2022   Patient negotiates ramps, curbs & stairs with single rail with prosthesis & cane or less independently. Baseline: SEE OBJECTIVE DATA Goal status: MET 04/25/2022   6.   Patient verbalizes & demonstrates work simulated tasks like lifting & carrying. Baseline: SEE OBJECTIVE DATA Goal status: MET 04/25/2022       PLAN: PT FREQUENCY: 1-2x/week   PT DURATION: 12 weeks   PLANNED INTERVENTIONS: Therapeutic exercises, Therapeutic activity, Neuromuscular re-education, Balance training, Gait training, Patient/Family education, Self Care, Stair training, Prosthetic training, DME instructions, scar mobilization, Ultrasound, Ionotophoresis 62m/ml Dexamethasone, Manual therapy, Re-evaluation, and physical performance tests   PLAN FOR NEXT SESSION:  discharge PT   RJamey Reas PT, DPT 04/25/2022, 11:28 AM

## 2022-05-03 ENCOUNTER — Other Ambulatory Visit: Payer: Self-pay | Admitting: Orthopedic Surgery

## 2022-05-08 ENCOUNTER — Encounter: Payer: Self-pay | Admitting: Physical Medicine and Rehabilitation

## 2022-05-08 ENCOUNTER — Encounter
Payer: BC Managed Care – PPO | Attending: Physical Medicine and Rehabilitation | Admitting: Physical Medicine and Rehabilitation

## 2022-05-08 VITALS — BP 144/89 | HR 95 | Ht 71.0 in | Wt 278.4 lb

## 2022-05-08 DIAGNOSIS — Z79891 Long term (current) use of opiate analgesic: Secondary | ICD-10-CM | POA: Diagnosis not present

## 2022-05-08 DIAGNOSIS — S88111A Complete traumatic amputation at level between knee and ankle, right lower leg, initial encounter: Secondary | ICD-10-CM | POA: Diagnosis not present

## 2022-05-08 DIAGNOSIS — Z5181 Encounter for therapeutic drug level monitoring: Secondary | ICD-10-CM | POA: Diagnosis not present

## 2022-05-08 DIAGNOSIS — G894 Chronic pain syndrome: Secondary | ICD-10-CM | POA: Insufficient documentation

## 2022-05-08 NOTE — Progress Notes (Signed)
Subjective:    Patient ID: Valerie Le, female    DOB: 09-29-1970, 51 y.o.   MRN: [id number]  HPI Valerie Le is a 51 year old woman who presents for follow-up up right BKA.   1) Right BKA Walking and driving Ambulating without cane No pain with walking.  No medications are helpful.  She needs refills on hydroxyzine and hydrocodone.   2) Constipation -getting a little.   Pain Inventory Average Pain 4 Pain Right Now 4 My pain is stabbing and aching  In the last 24 hours, has pain interfered with the following? General activity 2 Relation with others 2 Enjoyment of life 2 What TIME of day is your pain at its worst? evening and night Sleep (in general) Fair  Pain is worse with: inactivity Pain improves with:  . Relief from Meds:  .  walk without assistance ability to climb steps?  yes do you drive?  yes transfers alone Do you have any goals in this area?  yes  employed # of hrs/week .  No problems in this area  Any changes since last visit?  no  Any changes since last visit?  no    History reviewed. No pertinent family history. Social History   Socioeconomic History   Marital status: Married    Spouse name: Not on file   Number of children: Not on file   Years of education: Not on file   Highest education level: Not on file  Occupational History   Not on file  Tobacco Use   Smoking status: Every Day    Packs/day: 0.25    Years: 25.00    Total pack years: 6.25    Types: Cigarettes   Smokeless tobacco: Never   Tobacco comments:    Cut down on smoking while on Chantix-  Vaping Use   Vaping Use: Never used  Substance and Sexual Activity   Alcohol use: Not Currently    Comment: Once a year on New Year's Day   Drug use: Not Currently   Sexual activity: Yes    Birth control/protection: Implant    Comment: Essure Implant  Other Topics Concern   Not on file  Social History Narrative   Not on file   Social Determinants of  Health   Financial Resource Strain: Not on file  Food Insecurity: Not on file  Transportation Needs: Not on file  Physical Activity: Not on file  Stress: Not on file  Social Connections: Not on file   Past Surgical History:  Procedure Laterality Date   AMPUTATION Right 12/22/2020   Procedure: RIGHT TRANSMETATARSAL AMPUTATION;  Surgeon: [name], MD;  Location: [facility] OR;  Service: Orthopedics;  Laterality: Right;   AMPUTATION Right 06/29/2021   Procedure: REVISION RIGHT TRANSMETATARSAL AMPUTATION;  Surgeon: [name], MD;  Location: [facility] OR;  Service: Orthopedics;  Laterality: Right;   AMPUTATION Right 11/11/2021   Procedure: RIGHT BELOW KNEE AMPUTATION;  Surgeon: [name], MD;  Location: [facility] OR;  Service: Orthopedics;  Laterality: Right;   AMPUTATION TOE Right 12/04/2020   Procedure: AMPUTATION RIGHT SECOND TOE, I&D FOOT;  Surgeon: Eldred Manges, MD;  Location: [facility];  Service: Orthopedics;  Laterality: Right;  Right second toe amputation, I&D right foot.   APPLICATION OF WOUND VAC  11/11/2021   Procedure: APPLICATION OF WOUND VAC;  Surgeon: [name], MD;  Location: [facility] OR;  Service: Orthopedics;;   birth control implant  07/31/2006   Essure Implant  TONSILLECTOMY     TYMPANOSTOMY TUBE PLACEMENT     WISDOM TOOTH EXTRACTION     Past Medical History:  Diagnosis Date   Anemia    Asthma    as a child   Depression    Diabetes mellitus without complication (HCC)    Type 1 - Has Insulin Pump   Headache    migraines   High cholesterol    Hypertension    BP (!) 144/89   Pulse 95   Ht 5\' 11"  (1.803 m)   Wt 278 lb 6.4 oz (126.3 kg)   SpO2 95%   BMI 38.83 kg/m   Opioid Risk Score:   Fall Risk Score:  `1  Depression screen [facility] 2/9     06/22/2021    9:27 AM 02/01/2021    2:05 PM  Depression screen PHQ 2/9  Decreased Interest 0 0  Down, Depressed, Hopeless 0 0  PHQ - 2 Score 0 0      Review of Systems  Musculoskeletal:  Positive for gait problem.  All  other systems reviewed and are negative.     Objective:   Physical Exam Gen: no distress, normal appearing HEENT: oral mucosa pink and moist, NCAT Cardio: Reg rate Chest: normal effort, normal rate of breathing Abd: soft, non-distended Ext: no edema Psych: pleasant, normal affect Skin: intact Neuro: Alert and oriented x3.  MSK: right BKA.       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 Discussed that this is a capsaicin patch, stronger than capsaicin cream. Discussed that it is currently approved for diabetic peripheral neuropathy and post-herpetic neuralgia, but that it has also shown benefit in treating other forms of neuropathy. Provided patient with link to site to learn more about the patch: 4/9. Discussed that the patch would be placed in office and benefits usually last 3 months. Discussed that unintended exposure to capsaicin can cause severe irritation of eyes, mucous membranes, respiratory tract, and skin, but that Qutenza is a local treatment and does not have the systemic side effects of other nerve medications. Discussed that there may be pain, itching, erythema, and decreased sensory function associated with the application of Qutenza. Side effects usually subside within 1 week. A cold pack of analgesic medications can help with these side effects. Blood pressure can also be increased due to pain associated with administration of the patch.   -UDS and pain contract performed today. If contains expected metabolites, will prescribe hydrocodone BID PRN.  -Discussed current symptoms of pain and history of pain.  -Discussed benefits of exercise in reducing pain. -Discussed following foods that may reduce pain: 1) Ginger (especially studied for arthritis)- reduce leukotriene production to decrease inflammation 2)  Blueberries- high in phytonutrients that decrease inflammation 3) Salmon- marine omega-3s reduce joint swelling and pain 4) Pumpkin seeds- reduce inflammation 5) dark chocolate- reduces inflammation 6) turmeric- reduces inflammation 7) tart cherries - reduce pain and stiffness 8) extra virgin olive oil - its compound olecanthal helps to block prostaglandins  9) chili peppers- can be eaten or applied topically via capsaicin 10) mint- helpful for headache, muscle aches, joint pain, and itching 11) garlic- reduces inflammation  Link to further information on diet for chronic pain: https://www.clark.biz/   2) .Constipation:  -Provided list of following foods that help with constipation and highlighted a few: 1) prunes- contain high amounts of fiber.  2)  apples- has a form of dietary fiber called pectin that accelerates stool movement and increases beneficial gut bacteria 3) pears- in addition to fiber, also high in fructose and sorbitol which have laxative effect 4) figs- contain an enzyme ficin which helps to speed colonic transit 5) kiwis- contain an enzyme actinidin that improves gut motility and reduces constipation 6) oranges- rich in pectin (like apples) 7) grapefruits- contain a flavanol naringenin which has a laxative effect 8) vegetables- rich in fiber and also great sources of folate, vitamin C, and K 9) artichoke- high in inulin, prebiotic great for the microbiome 10) chicory- increases stool frequency and softness (can be added to coffee) 11) rhubarb- laxative effect 12) sweet potato- high fiber 13) beans, peas, and lentils- contain both soluble and insoluble fiber 14) chia seeds- improves intestinal health and gut flora 15) flaxseeds- laxative effect 16) whole grain rye bread- high in fiber 17) oat bran- high in soluble and insoluble fiber 18) kefir- softens stools -recommended to try at least one of these  foods every day.  -drink 6-8 glasses of water per day -walk regularly, especially after meals.

## 2022-05-08 NOTE — Patient Instructions (Signed)
Constipation:  -Provided list of following foods that help with constipation and highlighted a few: 1) prunes- contain high amounts of fiber.  2) apples- has a form of dietary fiber called pectin that accelerates stool movement and increases beneficial gut bacteria 3) pears- in addition to fiber, also high in fructose and sorbitol which have laxative effect 4) figs- contain an enzyme ficin which helps to speed colonic transit 5) kiwis- contain an enzyme actinidin that improves gut motility and reduces constipation 6) oranges- rich in pectin (like apples) 7) grapefruits- contain a flavanol naringenin which has a laxative effect 8) vegetables- rich in fiber and also great sources of folate, vitamin C, and K 9) artichoke- high in inulin, prebiotic great for the microbiome 10) chicory- increases stool frequency and softness (can be added to coffee) 11) rhubarb- laxative effect 12) sweet potato- high fiber 13) beans, peas, and lentils- contain both soluble and insoluble fiber 14) chia seeds- improves intestinal health and gut flora 15) flaxseeds- laxative effect 16) whole grain rye bread- high in fiber 17) oat bran- high in soluble and insoluble fiber 18) kefir- softens stools -recommended to try at least one of these foods every day.  -drink 6-8 glasses of water per day -walk regularly, especially after meals.      Foods that may reduce pain: 1) Ginger (especially studied for arthritis)- reduce leukotriene production to decrease inflammation 2) Blueberries- high in phytonutrients that decrease inflammation 3) Salmon- marine omega-3s reduce joint swelling and pain 4) Pumpkin seeds- reduce inflammation 5) dark chocolate- reduces inflammation 6) turmeric- reduces inflammation 7) tart cherries - reduce pain and stiffness 8) extra virgin olive oil - its compound olecanthal helps to block prostaglandins  9) chili peppers- can be eaten or applied topically via capsaicin 10) mint- helpful for  headache, muscle aches, joint pain, and itching 11) garlic- reduces inflammation  Link to further information on diet for chronic pain: http://www.bray.com/

## 2022-05-11 LAB — TOXASSURE SELECT,+ANTIDEPR,UR

## 2022-05-15 ENCOUNTER — Other Ambulatory Visit: Payer: Self-pay | Admitting: Physical Medicine and Rehabilitation

## 2022-05-15 MED ORDER — OXYCODONE HCL 5 MG PO TABS: 5.0000 mg | ORAL_TABLET | Freq: Two times a day (BID) | ORAL | 0 refills | Status: DC | PRN

## 2022-05-17 ENCOUNTER — Other Ambulatory Visit: Payer: Self-pay | Admitting: Physical Medicine and Rehabilitation

## 2022-06-02 ENCOUNTER — Telehealth: Payer: Self-pay | Admitting: *Deleted

## 2022-06-02 NOTE — Telephone Encounter (Signed)
Urine drug screen for this encounter is consistent for expectations of no medications.

## 2022-06-05 ENCOUNTER — Ambulatory Visit (INDEPENDENT_AMBULATORY_CARE_PROVIDER_SITE_OTHER): Payer: BC Managed Care – PPO | Admitting: Orthopedic Surgery

## 2022-06-05 ENCOUNTER — Encounter: Payer: Self-pay | Admitting: Orthopedic Surgery

## 2022-06-05 DIAGNOSIS — S88111A Complete traumatic amputation at level between knee and ankle, right lower leg, initial encounter: Secondary | ICD-10-CM

## 2022-06-05 DIAGNOSIS — Z89511 Acquired absence of right leg below knee: Secondary | ICD-10-CM | POA: Diagnosis not present

## 2022-06-05 NOTE — Progress Notes (Signed)
Office Visit Note   Patient: Valerie Le           Date of Birth: 1970/07/28           MRN: [id number] Visit Date: 06/05/2022              Requested by: Maud Deed, [place] [address] [place],  [place] [address] PCP: Maud Deed, [place]  Chief Complaint  Patient presents with   Right Leg - Follow-up    11/11/21 right BKA with Kerecis      HPI: Patient is a 51 year old woman who is status post a right transtibial amputation in May.  Patient states she received her socket in August and has been subsiding into the socket causing callus over the residual limb.  Assessment & Plan: Visit Diagnoses:  1. Below-knee amputation of right lower extremity (HCC)     Plan: Patient has a follow-up with [facility] to adjust the socket.  Will reevaluate in 3 months anticipate that we will need to set her up for a new socket Or new materials and supplies.  Follow-Up Instructions: Return in about 3 months (around 09/04/2022).   Ortho Exam  Patient is alert, oriented, no adenopathy, well-dressed, normal affect, normal respiratory effort. Examination patient is subsiding into his socket secondary to loss of volume.  She has callus over the residual limb secondary to this subsidence.  There is no cellulitis.  Imaging: No results found. No images are attached to the encounter.  Labs: Lab Results  Component Value Date   HGBA1C 7.4 (H) 11/11/2021   HGBA1C 13.1 (H) 12/02/2020   ESRSEDRATE 84 (H) 11/02/2021   ESRSEDRATE 72 (H) 06/15/2021   CRP 24.7 (H) 06/15/2021   REPTSTATUS 11/07/2021 FINAL 11/02/2021   GRAMSTAIN  06/29/2021    RARE WBC PRESENT, PREDOMINANTLY MONONUCLEAR NO ORGANISMS SEEN    CULT  11/02/2021    NO GROWTH 5 DAYS Performed at [facility], [address], [place], [place] [address]    [facility] STAPHYLOCOCCUS EPIDERMIDIS 06/29/2021   LABORGA STAPHYLOCOCCUS LUGDUNENSIS 06/29/2021     Lab Results  Component Value Date   ALBUMIN  1.9 (L) 11/17/2021   ALBUMIN 2.1 (L) 11/03/2021   ALBUMIN 2.6 (L) 12/03/2020   PREALBUMIN 11.4 (L) 11/11/2021    Lab Results  Component Value Date   MG 1.9 11/11/2021   Lab Results  Component Value Date   VD25OH 33.19 11/17/2021   VD25OH 32.37 11/11/2021    Lab Results  Component Value Date   PREALBUMIN 11.4 (L) 11/11/2021      Latest Ref Rng & Units 11/21/2021    6:07 AM 11/17/2021    5:20 AM 11/13/2021    3:13 AM  CBC EXTENDED  WBC 4.0 - 10.5 K/uL 12.8  14.7  17.7   RBC 3.87 - 5.11 MIL/uL 3.38  3.25  3.59   Hemoglobin 12.0 - 15.0 g/dL 7.7  7.2  8.1   HCT 09.3 - 46.0 % 23.4  22.7  25.1   Platelets 150 - 400 K/uL 363  331  437   NEUT# 1.7 - 7.7 K/uL  9.3    Lymph# 0.7 - 4.0 K/uL  3.2       There is no height or weight on file to calculate BMI.  Orders:  No orders of the defined types were placed in this encounter.  No orders of the defined types were placed in this encounter.    Procedures: No procedures performed  Clinical Data:  No additional findings.  ROS:  All other systems negative, except as noted in the HPI. Review of Systems  Objective: Vital Signs: There were no vitals taken for this visit.  Specialty Comments:  No specialty comments available.  PMFS History: Patient Active Problem List   Diagnosis Date Noted   S/P BKA (below knee amputation), right ([place]) 11/16/2021   Below-knee amputation of right lower extremity ([place]) 11/11/2021   Hypoalbuminemia 11/03/2021   Non-pressure chronic ulcer of other part of right foot limited to breakdown of skin (HCC)    Chest pain 11/02/2021   Elevated troponin 11/02/2021   SIRS (systemic inflammatory response syndrome) ([place]) 11/02/2021   Diabetes mellitus type 1 ([place]) 11/02/2021   Thrombocytosis 11/02/2021   Microcytic hypochromic anemia 11/02/2021   Hypokalemia 11/02/2021   Morbid obesity ([place]) 11/02/2021   Medication monitoring encounter 06/15/2021   Smoking 02/01/2021   Acute hematogenous  osteomyelitis of right foot ([place]) 12/22/2020   Osteomyelitis of foot, right, acute ([place])    Dehiscence of amputation stump ([place])    Diabetic foot infection ([place]) 12/02/2020   Essential hypertension 12/02/2020   Hyperlipidemia 08/21/2018   Bipolar affective disorder, currently depressed, moderate ([place]) 01/23/2017   Insulin-treated type 2 diabetes mellitus ([place]) 03/07/2013   Asthma 03/07/2013   ADHD (attention deficit hyperactivity disorder) 03/07/2013   Migraine 03/07/2013   Tobacco abuse 03/07/2013   Past Medical History:  Diagnosis Date   Anemia    Asthma    as a child   Depression    Diabetes mellitus without complication (HCC)    Type 1 - Has Insulin Pump   Headache    migraines   High cholesterol    Hypertension     History reviewed. No pertinent family history.  Past Surgical History:  Procedure Laterality Date   AMPUTATION Right 12/22/2020   Procedure: RIGHT TRANSMETATARSAL AMPUTATION;  Surgeon: Newt Minion, MD;  Location: [place];  Service: Orthopedics;  Laterality: Right;   AMPUTATION Right 06/29/2021   Procedure: REVISION RIGHT TRANSMETATARSAL AMPUTATION;  Surgeon: Newt Minion, MD;  Location: [place];  Service: Orthopedics;  Laterality: Right;   AMPUTATION Right 11/11/2021   Procedure: RIGHT BELOW KNEE AMPUTATION;  Surgeon: Newt Minion, MD;  Location: [place];  Service: Orthopedics;  Laterality: Right;   AMPUTATION TOE Right 12/04/2020   Procedure: AMPUTATION RIGHT SECOND TOE, I&D FOOT;  Surgeon: Marybelle Killings, MD;  Location: [facility];  Service: Orthopedics;  Laterality: Right;  Right second toe amputation, I&D right foot.   APPLICATION OF WOUND VAC  11/11/2021   Procedure: APPLICATION OF WOUND VAC;  Surgeon: Newt Minion, MD;  Location: [place];  Service: Orthopedics;;   birth control implant  07/31/2006   Essure Implant   TONSILLECTOMY     TYMPANOSTOMY TUBE PLACEMENT     WISDOM TOOTH EXTRACTION     Social History   Occupational History   Not on file  Tobacco  Use   Smoking status: Every Day    Packs/day: 0.25    Years: 25.00    Total pack years: 6.25    Types: Cigarettes   Smokeless tobacco: Never   Tobacco comments:    Cut down on smoking while on Chantix-  Vaping Use   Vaping Use: Never used  Substance and Sexual Activity   Alcohol use: Not Currently    Comment: Once a year on New Year's Day   Drug use: Not Currently   Sexual activity: Yes    Birth control/protection: Implant  Comment: Essure Implant

## 2022-06-07 ENCOUNTER — Encounter: Payer: BC Managed Care – PPO | Admitting: Registered Nurse

## 2022-06-13 ENCOUNTER — Encounter: Payer: BC Managed Care – PPO | Attending: Physical Medicine and Rehabilitation | Admitting: Registered Nurse

## 2022-06-13 ENCOUNTER — Encounter: Payer: Self-pay | Admitting: Registered Nurse

## 2022-06-13 VITALS — BP 153/96 | HR 98 | Wt 284.0 lb

## 2022-06-13 DIAGNOSIS — Z5181 Encounter for therapeutic drug level monitoring: Secondary | ICD-10-CM

## 2022-06-13 DIAGNOSIS — S88111A Complete traumatic amputation at level between knee and ankle, right lower leg, initial encounter: Secondary | ICD-10-CM

## 2022-06-13 DIAGNOSIS — Z79891 Long term (current) use of opiate analgesic: Secondary | ICD-10-CM | POA: Diagnosis present

## 2022-06-13 DIAGNOSIS — G894 Chronic pain syndrome: Secondary | ICD-10-CM | POA: Diagnosis present

## 2022-06-13 MED ORDER — OXYCODONE HCL 5 MG PO TABS: 5.0000 mg | ORAL_TABLET | Freq: Two times a day (BID) | ORAL | 0 refills | Status: DC | PRN

## 2022-06-13 NOTE — Progress Notes (Signed)
Subjective:    Patient ID: Valerie Le, female    DOB: November 29, 1970, 51 y.o.   MRN: [id number]  HPI: Valerie Le is a 51 y.o. female who returns for follow up appointment for chronic pain and medication refill. She states her pain is located in her stump and occasionally groin pain when laying down, she will keep a journal, and will call or send a My-Chart message with update. She verbalizes understanding. She rates her pain 6. Her current exercise regime is walking and performing stretching exercises.  Ms. Downs Morphine equivalent is 15.00 MME.   Last UDS was Performed on 05/08/2022, it was consistent.      Pain Inventory Average Pain 6 Pain Right Now 6 My pain is intermittent, constant, and aching  In the last 24 hours, has pain interfered with the following? General activity 6 Relation with others 6 Enjoyment of life 8 What TIME of day is your pain at its worst? evening Sleep (in general) Fair  Pain is worse with: inactivity Pain improves with: medication and sleep Relief from Meds: 7  No family history on file. Social History   Socioeconomic History   Marital status: Married    Spouse name: Not on file   Number of children: Not on file   Years of education: Not on file   Highest education level: Not on file  Occupational History   Not on file  Tobacco Use   Smoking status: Every Day    Packs/day: 0.25    Years: 25.00    Total pack years: 6.25    Types: Cigarettes   Smokeless tobacco: Never   Tobacco comments:    Cut down on smoking while on Chantix-  Vaping Use   Vaping Use: Never used  Substance and Sexual Activity   Alcohol use: Not Currently    Comment: Once a year on New Year's Day   Drug use: Not Currently   Sexual activity: Yes    Birth control/protection: Implant    Comment: Essure Implant  Other Topics Concern   Not on file  Social History Narrative   Not on file   Social Determinants of Health   Financial  Resource Strain: Not on file  Food Insecurity: Not on file  Transportation Needs: Not on file  Physical Activity: Not on file  Stress: Not on file  Social Connections: Not on file   Past Surgical History:  Procedure Laterality Date   AMPUTATION Right 12/22/2020   Procedure: RIGHT TRANSMETATARSAL AMPUTATION;  Surgeon: Newt Minion, MD;  Location: [place];  Service: Orthopedics;  Laterality: Right;   AMPUTATION Right 06/29/2021   Procedure: REVISION RIGHT TRANSMETATARSAL AMPUTATION;  Surgeon: Newt Minion, MD;  Location: [facility];  Service: Orthopedics;  Laterality: Right;   AMPUTATION Right 11/11/2021   Procedure: RIGHT BELOW KNEE AMPUTATION;  Surgeon: Newt Minion, MD;  Location: [place];  Service: Orthopedics;  Laterality: Right;   AMPUTATION TOE Right 12/04/2020   Procedure: AMPUTATION RIGHT SECOND TOE, I&D FOOT;  Surgeon: Marybelle Killings, MD;  [facility];  Service: Orthopedics;  Laterality: Right;  Right second toe amputation, I&D right foot.   APPLICATION OF WOUND VAC  11/11/2021   Procedure: APPLICATION OF WOUND VAC;  Surgeon: Newt Minion, MD;  Location: [place];  Service: Orthopedics;;   birth control implant  07/31/2006   Essure Implant   TONSILLECTOMY     TYMPANOSTOMY TUBE PLACEMENT     WISDOM TOOTH EXTRACTION  Past Surgical History:  Procedure Laterality Date   AMPUTATION Right 12/22/2020   Procedure: RIGHT TRANSMETATARSAL AMPUTATION;  Surgeon: [name], MD;  Location[facility];  Service: Orthopedics;  Laterality: Right;   AMPUTATION Right 06/29/2021   Procedure: REVISION RIGHT TRANSMETATARSAL AMPUTATION;  Surgeon: [name], MD;  Location: [facility] OR;  Service: Orthopedics;  Laterality: Right;   AMPUTATION Right 11/11/2021   Procedure: RIGHT BELOW KNEE AMPUTATION;  Surgeon: [name], MD;  Location: [facility] OR;  Service: Orthopedics;  Laterality: Right;   AMPUTATION TOE Right 12/04/2020   Procedure: AMPUTATION RIGHT SECOND TOE, I&D FOOT;  Surgeon: Eldred Manges, MD;   [facility];  Service: Orthopedics;  Laterality: Right;  Right second toe amputation, I&D right foot.   APPLICATION OF WOUND VAC  11/11/2021   Procedure: APPLICATION OF WOUND VAC;  Surgeon: [name], MD;  Location: [facility] OR;  Service: Orthopedics;;   birth control implant  07/31/2006   Essure Implant   TONSILLECTOMY     TYMPANOSTOMY TUBE PLACEMENT     WISDOM TOOTH EXTRACTION     Past Medical History:  Diagnosis Date   Anemia    Asthma    as a child   Depression    Diabetes mellitus without complication (HCC)    Type 1 - Has Insulin Pump   Headache    migraines   High cholesterol    Hypertension    There were no vitals taken for this visit.  Opioid Risk Score:   Fall Risk Score:  `1  Depression screen [facility] 2/9     05/08/2022    1:42 PM 06/22/2021    9:27 AM 02/01/2021    2:05 PM  Depression screen PHQ 2/9  Decreased Interest 0 0 0  Down, Depressed, Hopeless 2 0 0  PHQ - 2 Score 2 0 0  Altered sleeping 2    Tired, decreased energy 0    Change in appetite 1    Feeling bad or failure about yourself  2    Trouble concentrating 1    Moving slowly or fidgety/restless 0    Suicidal thoughts 0    PHQ-9 Score 8    Difficult doing work/chores Somewhat difficult      Review of Systems  Genitourinary:        Right groin pain  Musculoskeletal:  Positive for gait problem.       Right stump pain       Objective:   Physical Exam Vitals and nursing note reviewed.  Constitutional:      Appearance: Normal appearance.  Cardiovascular:     Rate and Rhythm: Normal rate and regular rhythm.     Pulses: Normal pulses.     Heart sounds: Normal heart sounds.  Pulmonary:     Effort: Pulmonary effort is normal.     Breath sounds: Normal breath sounds.  Musculoskeletal:     Cervical back: Normal range of motion and neck supple.     Comments: Normal Muscle Bulk and Muscle Testing Reveals:  Upper Extremities: Full ROM and Muscle Strength 5/5 Lower Extremities: Right: BKA:  No Drainage Wearing Prosthesis Left Lower Extremity: Full ROM and Muscle Strength 5/5 Arises from Table with Ease Narrow Based  Gait     Skin:    General: Skin is warm and dry.  Neurological:     Mental Status: She is alert and oriented to person, place, and time.  Psychiatric:        Mood and  Affect: Mood normal.        Behavior: Behavior normal.         Assessment & Plan:  Right BKA: Continue HEP as Tolerated. Continue to Monitor.  Chronic Pain Syndrome: Refilled: Oxycodone 5 mg one tablet twice a day as needed for pain #60. We will continue the opioid monitoring program, this consists of regular clinic visits, examinations, urine drug screen, pill counts as well as use of [place] Controlled Substance Reporting system. A 12 month History has been reviewed on the [place] on 06/13/2022 F/U in 1 month

## 2022-07-07 ENCOUNTER — Encounter: Payer: BC Managed Care – PPO | Attending: Physical Medicine and Rehabilitation | Admitting: Registered Nurse

## 2022-07-07 DIAGNOSIS — G894 Chronic pain syndrome: Secondary | ICD-10-CM | POA: Insufficient documentation

## 2022-07-07 DIAGNOSIS — Z5181 Encounter for therapeutic drug level monitoring: Secondary | ICD-10-CM | POA: Insufficient documentation

## 2022-07-07 DIAGNOSIS — S88111A Complete traumatic amputation at level between knee and ankle, right lower leg, initial encounter: Secondary | ICD-10-CM | POA: Insufficient documentation

## 2022-07-07 DIAGNOSIS — Z79891 Long term (current) use of opiate analgesic: Secondary | ICD-10-CM | POA: Insufficient documentation

## 2022-07-07 NOTE — Progress Notes (Deleted)
Subjective:    Patient ID: Valerie Le, female    DOB: 10-29-1970, 52 y.o.   MRN: [id number]  HPI  Pain Inventory Average Pain {NUMBERS; 0-10:5044} Pain Right Now {NUMBERS; 0-10:5044} My pain is {PAIN DESCRIPTION:21022940}  In the last 24 hours, has pain interfered with the following? General activity {NUMBERS; 0-10:5044} Relation with others {NUMBERS; 0-10:5044} Enjoyment of life {NUMBERS; 0-10:5044} What TIME of day is your pain at its worst? {time of day:24191} Sleep (in general) {BHH GOOD/FAIR/POOR:22877}  Pain is worse with: {ACTIVITIES:21022942} Pain improves with: {PAIN IMPROVES BW:[id number] Relief from Meds: {NUMBERS; 0-10:5044}  No family history on file. Social History   Socioeconomic History   Marital status: Married    Spouse name: Not on file   Number of children: Not on file   Years of education: Not on file   Highest education level: Not on file  Occupational History   Not on file  Tobacco Use   Smoking status: Every Day    Packs/day: 0.25    Years: 25.00    Total pack years: 6.25    Types: Cigarettes   Smokeless tobacco: Never   Tobacco comments:    Cut down on smoking while on Chantix-  Vaping Use   Vaping Use: Never used  Substance and Sexual Activity   Alcohol use: Not Currently    Comment: Once a year on New Year's Day   Drug use: Not Currently   Sexual activity: Yes    Birth control/protection: Implant    Comment: Essure Implant  Other Topics Concern   Not on file  Social History Narrative   Not on file   Social Determinants of Health   Financial Resource Strain: Not on file  Food Insecurity: Not on file  Transportation Needs: Not on file  Physical Activity: Not on file  Stress: Not on file  Social Connections: Not on file   Past Surgical History:  Procedure Laterality Date   AMPUTATION Right 12/22/2020   Procedure: RIGHT TRANSMETATARSAL AMPUTATION;  Surgeon: Newt Minion, MD;  Location: [place];  Service:  Orthopedics;  Laterality: Right;   AMPUTATION Right 06/29/2021   Procedure: REVISION RIGHT TRANSMETATARSAL AMPUTATION;  Surgeon: Newt Minion, MD;  Location: [place];  Service: Orthopedics;  Laterality: Right;   AMPUTATION Right 11/11/2021   Procedure: RIGHT BELOW KNEE AMPUTATION;  Surgeon: Newt Minion, MD;  Location: [place];  Service: Orthopedics;  Laterality: Right;   AMPUTATION TOE Right 12/04/2020   Procedure: AMPUTATION RIGHT SECOND TOE, I&D FOOT;  Surgeon: Marybelle Killings, MD;  Location: [facility];  Service: Orthopedics;  Laterality: Right;  Right second toe amputation, I&D right foot.   APPLICATION OF WOUND VAC  11/11/2021   Procedure: APPLICATION OF WOUND VAC;  Surgeon: Newt Minion, MD;  Location: [place];  Service: Orthopedics;;   birth control implant  07/31/2006   Essure Implant   TONSILLECTOMY     TYMPANOSTOMY TUBE PLACEMENT     WISDOM TOOTH EXTRACTION     Past Surgical History:  Procedure Laterality Date   AMPUTATION Right 12/22/2020   Procedure: RIGHT TRANSMETATARSAL AMPUTATION;  Surgeon: Newt Minion, MD;  Location: [place];  Service: Orthopedics;  Laterality: Right;   AMPUTATION Right 06/29/2021   Procedure: REVISION RIGHT TRANSMETATARSAL AMPUTATION;  Surgeon: Newt Minion, MD;  Location: [place];  Service: Orthopedics;  Laterality: Right;   AMPUTATION Right 11/11/2021   Procedure: RIGHT BELOW KNEE AMPUTATION;  Surgeon: Newt Minion, MD;  Location: [place];  Service: Orthopedics;  Laterality: Right;   AMPUTATION TOE Right 12/04/2020   Procedure: AMPUTATION RIGHT SECOND TOE, I&D FOOT;  Surgeon: Marybelle Killings, MD;  Location[facility];  Service: Orthopedics;  Laterality: Right;  Right second toe amputation, I&D right foot.   APPLICATION OF WOUND VAC  11/11/2021   Procedure: APPLICATION OF WOUND VAC;  Surgeon: Newt Minion, MD;  Location: [place];  Service: Orthopedics;;   birth control implant  07/31/2006   Essure Implant   TONSILLECTOMY     TYMPANOSTOMY TUBE PLACEMENT     WISDOM  TOOTH EXTRACTION     Past Medical History:  Diagnosis Date   Anemia    Asthma    as a child   Depression    Diabetes mellitus without complication ([place])    Type 1 - Has Insulin Pump   Headache    migraines   High cholesterol    Hypertension    There were no vitals taken for this visit.  Opioid Risk Score:   Fall Risk Score:  `1  Depression screen [facility] 2/9     06/13/2022    8:19 AM 05/08/2022    1:42 PM 06/22/2021    9:27 AM 02/01/2021    2:05 PM  Depression screen PHQ 2/9  Decreased Interest 0 0 0 0  Down, Depressed, Hopeless 0 2 0 0  PHQ - 2 Score 0 2 0 0  Altered sleeping  2    Tired, decreased energy  0    Change in appetite  1    Feeling bad or failure about yourself   2    Trouble concentrating  1    Moving slowly or fidgety/restless  0    Suicidal thoughts  0    PHQ-9 Score  8    Difficult doing work/chores  Somewhat difficult       Review of Systems     Objective:   Physical Exam        Assessment & Plan:

## 2022-08-07 ENCOUNTER — Encounter: Payer: BC Managed Care – PPO | Admitting: Physical Medicine and Rehabilitation

## 2022-08-14 ENCOUNTER — Encounter
Payer: BC Managed Care – PPO | Attending: Physical Medicine and Rehabilitation | Admitting: Physical Medicine and Rehabilitation

## 2022-08-14 VITALS — BP 178/94 | HR 90 | Ht 71.0 in | Wt 278.8 lb

## 2022-08-14 DIAGNOSIS — D509 Iron deficiency anemia, unspecified: Secondary | ICD-10-CM | POA: Diagnosis present

## 2022-08-14 DIAGNOSIS — S88111A Complete traumatic amputation at level between knee and ankle, right lower leg, initial encounter: Secondary | ICD-10-CM | POA: Diagnosis present

## 2022-08-14 MED ORDER — HYDROCODONE-ACETAMINOPHEN 5-325 MG PO TABS: 1.0000 | ORAL_TABLET | Freq: Every day | ORAL | 0 refills | Status: DC | PRN

## 2022-08-14 NOTE — Progress Notes (Signed)
Subjective:    Patient ID: Valerie Le, female    DOB: [birth year], 52 y.o.   MRN: [id number]  HPI Valerie Le is a 52 year old woman who presents for follow-up up right BKA.   1) Right BKA Walking and driving Ambulating without cane No pain with walking.  No medications are helpful.  -she took hydrocodone in the past -she is taking oxycodone '5mg'$  but this is causing her nausea  2) Constipation -getting a little.   3) iron level low -this is making her more constipated  Pain Inventory Average Pain 6 Pain Right Now 4 My pain is constant and aching  In the last 24 hours, has pain interfered with the following? General activity 4 Relation with others 4 Enjoyment of life 5 What TIME of day is your pain at its worst? daytime, evening, and night Sleep (in general) Good  Pain is worse with: walking and standing Pain improves with: medication Relief from Meds: 6     No family history on file. Social History   Socioeconomic History   Marital status: Married    Spouse name: Not on file   Number of children: Not on file   Years of education: Not on file   Highest education level: Not on file  Occupational History   Not on file  Tobacco Use   Smoking status: Every Day    Packs/day: 0.25    Years: 25.00    Total pack years: 6.25    Types: Cigarettes   Smokeless tobacco: Never   Tobacco comments:    Cut down on smoking while on Chantix-  Vaping Use   Vaping Use: Never used  Substance and Sexual Activity   Alcohol use: Not Currently    Comment: Once a year on New Year's Day   Drug use: Not Currently   Sexual activity: Yes    Birth control/protection: Implant    Comment: Essure Implant  Other Topics Concern   Not on file  Social History Narrative   Not on file   Social Determinants of Health   Financial Resource Strain: Not on file  Food Insecurity: Not on file  Transportation Needs: Not on file  Physical Activity: Not on file   Stress: Not on file  Social Connections: Not on file   Past Surgical History:  Procedure Laterality Date   AMPUTATION Right 12/22/2020   Procedure: RIGHT TRANSMETATARSAL AMPUTATION;  Surgeon: Newt Minion, MD;  Location: [place];  Service: Orthopedics;  Laterality: Right;   AMPUTATION Right 06/29/2021   Procedure: REVISION RIGHT TRANSMETATARSAL AMPUTATION;  Surgeon: Newt Minion, MD;  Location: [place];  Service: Orthopedics;  Laterality: Right;   AMPUTATION Right 11/11/2021   Procedure: RIGHT BELOW KNEE AMPUTATION;  Surgeon: Newt Minion, MD;  Location: [place];  Service: Orthopedics;  Laterality: Right;   AMPUTATION TOE Right 12/04/2020   Procedure: AMPUTATION RIGHT SECOND TOE, I&D FOOT;  Surgeon: Marybelle Killings, MD;  Location[facility];  Service: Orthopedics;  Laterality: Right;  Right second toe amputation, I&D right foot.   APPLICATION OF WOUND VAC  11/11/2021   Procedure: APPLICATION OF WOUND VAC;  Surgeon: Newt Minion, MD;  Location: [place];  Service: Orthopedics;;   birth control implant  07/31/2006   Essure Implant   TONSILLECTOMY     TYMPANOSTOMY TUBE PLACEMENT     WISDOM TOOTH EXTRACTION     Past Medical History:  Diagnosis Date   Anemia    Asthma  as a child   Depression    Diabetes mellitus without complication ([place])    Type 1 - Has Insulin Pump   Headache    migraines   High cholesterol    Hypertension    There were no vitals taken for this visit.  Opioid Risk Score:   Fall Risk Score:  `1  Depression screen [facility] 2/9     06/13/2022    8:19 AM 05/08/2022    1:42 PM 06/22/2021    9:27 AM 02/01/2021    2:05 PM  Depression screen PHQ 2/9  Decreased Interest 0 0 0 0  Down, Depressed, Hopeless 0 2 0 0  PHQ - 2 Score 0 2 0 0  Altered sleeping  2    Tired, decreased energy  0    Change in appetite  1    Feeling bad or failure about yourself   2    Trouble concentrating  1    Moving slowly or fidgety/restless  0    Suicidal thoughts  0    PHQ-9 Score  8     Difficult doing work/chores  Somewhat difficult        Review of Systems  Musculoskeletal:  Positive for gait problem.  All other systems reviewed and are negative.     Objective:   Physical Exam Gen: no distress, normal appearing, weight 278 lbs,  HEENT: oral mucosa pink and moist, NCAT Cardio: Reg rate Chest: normal effort, normal rate of breathing Abd: soft, non-distended Ext: no edema Psych: pleasant, normal affect Skin: intact Neuro: Alert and oriented x3.  MSK: right BKA.       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 Discussed that this is a capsaicin patch, stronger than capsaicin cream. Discussed that it is currently approved for diabetic peripheral neuropathy and post-herpetic neuralgia, but that it has also shown benefit in treating other forms of neuropathy. Provided patient with link to site to learn more about the patch: CinemaBonus.fr. Discussed that the patch would be placed in office and benefits usually last 3 months. Discussed that unintended exposure to capsaicin can cause severe irritation of eyes, mucous membranes, respiratory tract, and skin, but that Qutenza is a local treatment and does not have the systemic side effects of other nerve medications. Discussed that there may be pain, itching, erythema, and decreased sensory function associated with the application of Qutenza. Side effects usually subside within 1 week. A cold pack of analgesic medications can help with these side effects. Blood pressure can also be increased due to pain associated with administration of the patch.   -UDS and pain contract performed previously.  -prescribed Norco '5mg'$  daily PRN.  -Discussed current symptoms of pain and history of pain.  -Discussed benefits of exercise in reducing pain. -Discussed following foods that may reduce  pain: 1) Ginger (especially studied for arthritis)- reduce leukotriene production to decrease inflammation 2) Blueberries- high in phytonutrients that decrease inflammation 3) Salmon- marine omega-3s reduce joint swelling and pain 4) Pumpkin seeds- reduce inflammation 5) dark chocolate- reduces inflammation 6) turmeric- reduces inflammation 7) tart cherries - reduce pain and stiffness 8) extra virgin olive oil - its compound olecanthal helps to block prostaglandins  9) chili peppers- can be eaten or applied topically via capsaicin 10) mint- helpful for headache, muscle aches, joint pain, and itching 11) garlic- reduces inflammation  Link to further information  on diet for chronic pain: http://www.randall.com/   2) .Constipation:  -Provided list of following foods that help with constipation and highlighted a few: 1) prunes- contain high amounts of fiber.  2) apples- has a form of dietary fiber called pectin that accelerates stool movement and increases beneficial gut bacteria 3) pears- in addition to fiber, also high in fructose and sorbitol which have laxative effect 4) figs- contain an enzyme ficin which helps to speed colonic transit 5) kiwis- contain an enzyme actinidin that improves gut motility and reduces constipation 6) oranges- rich in pectin (like apples) 7) grapefruits- contain a flavanol naringenin which has a laxative effect 8) vegetables- rich in fiber and also great sources of folate, vitamin C, and K 9) artichoke- high in inulin, prebiotic great for the microbiome 10) chicory- increases stool frequency and softness (can be added to coffee) 11) rhubarb- laxative effect 12) sweet potato- high fiber 13) beans, peas, and lentils- contain both soluble and insoluble fiber 14) chia seeds- improves intestinal health and gut flora 15) flaxseeds- laxative effect 16) whole grain rye bread- high in fiber 17) oat  bran- high in soluble and insoluble fiber 18) kefir- softens stools -recommended to try at least one of these foods every day.  -drink 6-8 glasses of water per day -walk regularly, especially after meals.  -discussed eating iron rich foods to help her decrease her iron dose.

## 2022-08-14 NOTE — Patient Instructions (Signed)
Constipation:  -Provided list of following foods that help with constipation and highlighted a few: 1) prunes- contain high amounts of fiber.  2) apples- has a form of dietary fiber called pectin that accelerates stool movement and increases beneficial gut bacteria 3) pears- in addition to fiber, also high in fructose and sorbitol which have laxative effect 4) figs- contain an enzyme ficin which helps to speed colonic transit 5) kiwis- contain an enzyme actinidin that improves gut motility and reduces constipation 6) oranges- rich in pectin (like apples) 7) grapefruits- contain a flavanol naringenin which has a laxative effect 8) vegetables- rich in fiber and also great sources of folate, vitamin C, and K 9) artichoke- high in inulin, prebiotic great for the microbiome 10) chicory- increases stool frequency and softness (can be added to coffee) 11) rhubarb- laxative effect 12) sweet potato- high fiber 13) beans, peas, and lentils- contain both soluble and insoluble fiber 14) chia seeds- improves intestinal health and gut flora 15) flaxseeds- laxative effect 16) whole grain rye bread- high in fiber 17) oat bran- high in soluble and insoluble fiber 18) kefir- softens stools -recommended to try at least one of these foods every day.  -drink 6-8 glasses of water per day -walk regularly, especially after meals.

## 2022-09-05 ENCOUNTER — Ambulatory Visit (INDEPENDENT_AMBULATORY_CARE_PROVIDER_SITE_OTHER): Payer: BC Managed Care – PPO | Admitting: Orthopedic Surgery

## 2022-09-05 DIAGNOSIS — S88111A Complete traumatic amputation at level between knee and ankle, right lower leg, initial encounter: Secondary | ICD-10-CM

## 2022-09-05 DIAGNOSIS — Z89511 Acquired absence of right leg below knee: Secondary | ICD-10-CM

## 2022-09-10 ENCOUNTER — Encounter: Payer: Self-pay | Admitting: Orthopedic Surgery

## 2022-09-10 NOTE — Progress Notes (Signed)
Office Visit Note   Patient: Valerie Le           Date of Birth: [birth year]           MRN: [id number] Visit Date: 09/05/2022              Requested by: Willeen Niece, [address] [place],  [place] [address] PCP: Willeen Niece, [place]  Chief Complaint  Patient presents with   Right Leg - Follow-up    Hx right BKA 11/11/2021      HPI: Patient is a 52 year old woman who has a poorly fitting right transtibial amputation.  Patient states she has had significant decreased volume in the residual leg and is subsiding into the socket causing pain and skin breakdown.  Patient states she is driving.  She is back to work and doing well.  Assessment & Plan: Visit Diagnoses:  1. Below-knee amputation of right lower extremity ([place])     Plan: Prescription provided for a new socket, liner, materials with [employer].  Follow-Up Instructions: No follow-ups on file.   Ortho Exam  Patient is alert, oriented, no adenopathy, well-dressed, normal affect, normal respiratory effort. Examination patient has decreased residual volume.  She is and bearing pressure points at risk of skin breakdown.  She is 12 months out from her previous socket.  Patient is an existing right transtibial  amputee.  Patient's current comorbidities are not expected to impact the ability to function with the prescribed prosthesis. Patient verbally communicates a strong desire to use a prosthesis. Patient currently requires mobility aids to ambulate without a prosthesis.  Expects not to use mobility aids with a new prosthesis.  Patient is a K3 level ambulator that spends a lot of time walking around on uneven terrain over obstacles, up and down stairs, and ambulates with a variable cadence.     Imaging: No results found. No images are attached to the encounter.  Labs: Lab Results  Component Value Date   HGBA1C 7.4 (H) 11/11/2021   HGBA1C 13.1 (H) 12/02/2020   ESRSEDRATE 84 (H)  11/02/2021   ESRSEDRATE 72 (H) 06/15/2021   CRP 24.7 (H) 06/15/2021   REPTSTATUS 11/07/2021 FINAL 11/02/2021   GRAMSTAIN  06/29/2021    RARE WBC PRESENT, PREDOMINANTLY MONONUCLEAR NO ORGANISMS SEEN    CULT  11/02/2021    NO GROWTH 5 DAYS Performed at [facility], [place] [address] [place], [place] [address]    [facility] STAPHYLOCOCCUS EPIDERMIDIS 06/29/2021   LABORGA STAPHYLOCOCCUS LUGDUNENSIS 06/29/2021     Lab Results  Component Value Date   ALBUMIN 1.9 (L) 11/17/2021   ALBUMIN 2.1 (L) 11/03/2021   ALBUMIN 2.6 (L) 12/03/2020   PREALBUMIN 11.4 (L) 11/11/2021    Lab Results  Component Value Date   MG 1.9 11/11/2021   Lab Results  Component Value Date   VD25OH 33.19 11/17/2021   VD25OH 32.37 11/11/2021    Lab Results  Component Value Date   PREALBUMIN 11.4 (L) 11/11/2021      Latest Ref Rng & Units 11/21/2021    6:07 AM 11/17/2021    5:20 AM 11/13/2021    3:13 AM  CBC EXTENDED  WBC 4.0 - 10.5 K/uL 12.8  14.7  17.7   RBC 3.87 - 5.11 MIL/uL 3.38  3.25  3.59   Hemoglobin 12.0 - 15.0 g/dL 7.7  7.2  8.1   HCT 36.0 - 46.0 % 23.4  22.7  25.1   Platelets 150 - 400 K/uL 363  331  437   NEUT# 1.7 - 7.7 K/uL  9.3    Lymph# 0.7 - 4.0 K/uL  3.2       There is no height or weight on file to calculate BMI.  Orders:  No orders of the defined types were placed in this encounter.  No orders of the defined types were placed in this encounter.    Procedures: No procedures performed  Clinical Data: No additional findings.  ROS:  All other systems negative, except as noted in the HPI. Review of Systems  Objective: Vital Signs: There were no vitals taken for this visit.  Specialty Comments:  No specialty comments available.  PMFS History: Patient Active Problem List   Diagnosis Date Noted   S/P BKA (below knee amputation), right ([place]) 11/16/2021   Below-knee amputation of right lower extremity ([place]) 11/11/2021   Hypoalbuminemia 11/03/2021   Non-pressure  chronic ulcer of other part of right foot limited to breakdown of skin ([facility])    Chest pain 11/02/2021   Elevated troponin 11/02/2021   SIRS (systemic inflammatory response syndrome) ([place]) 11/02/2021   Diabetes mellitus type 1 ([place]) 11/02/2021   Thrombocytosis 11/02/2021   Microcytic hypochromic anemia 11/02/2021   Hypokalemia 11/02/2021   Morbid obesity ([place]) 11/02/2021   Medication monitoring encounter 06/15/2021   Smoking 02/01/2021   Acute hematogenous osteomyelitis of right foot ([place]) 12/22/2020   Osteomyelitis of foot, right, acute ([place])    Dehiscence of amputation stump ([place])    Diabetic foot infection ([place]) 12/02/2020   Essential hypertension 12/02/2020   Hyperlipidemia 08/21/2018   Bipolar affective disorder, currently depressed, moderate ([place]) 01/23/2017   Insulin-treated type 2 diabetes mellitus ([place]) 03/07/2013   Asthma 03/07/2013   ADHD (attention deficit hyperactivity disorder) 03/07/2013   Migraine 03/07/2013   Tobacco abuse 03/07/2013   Past Medical History:  Diagnosis Date   Anemia    Asthma    as a child   Depression    Diabetes mellitus without complication ([facility])    Type 1 - Has Insulin Pump   Headache    migraines   High cholesterol    Hypertension     History reviewed. No pertinent family history.  Past Surgical History:  Procedure Laterality Date   AMPUTATION Right 12/22/2020   Procedure: RIGHT TRANSMETATARSAL AMPUTATION;  Surgeon: Newt Minion, MD;  Location: [place];  Service: Orthopedics;  Laterality: Right;   AMPUTATION Right 06/29/2021   Procedure: REVISION RIGHT TRANSMETATARSAL AMPUTATION;  Surgeon: Newt Minion, MD;  Location: [place];  Service: Orthopedics;  Laterality: Right;   AMPUTATION Right 11/11/2021   Procedure: RIGHT BELOW KNEE AMPUTATION;  Surgeon: Newt Minion, MD;  Location: [place];  Service: Orthopedics;  Laterality: Right;   AMPUTATION TOE Right 12/04/2020   Procedure: AMPUTATION RIGHT SECOND TOE, I&D FOOT;  Surgeon: Marybelle Killings, MD;  Location[facility];  Service: Orthopedics;  Laterality: Right;  Right second toe amputation, I&D right foot.   APPLICATION OF WOUND VAC  11/11/2021   Procedure: APPLICATION OF WOUND VAC;  Surgeon: Newt Minion, MD;  Location: [place];  Service: Orthopedics;;   birth control implant  07/31/2006   Essure Implant   TONSILLECTOMY     TYMPANOSTOMY TUBE PLACEMENT     WISDOM TOOTH EXTRACTION     Social History   Occupational History   Not on file  Tobacco Use   Smoking status: Every Day    Packs/day: 0.25    Years: 25.00    Additional  pack years: 0.00    Total pack years: 6.25    Types: Cigarettes   Smokeless tobacco: Never   Tobacco comments:    Cut down on smoking while on Chantix-  Vaping Use   Vaping Use: Never used  Substance and Sexual Activity   Alcohol use: Not Currently    Comment: Once a year on New Year's Day   Drug use: Not Currently   Sexual activity: Yes    Birth control/protection: Implant    Comment: Essure Implant

## 2022-09-11 ENCOUNTER — Encounter
Payer: BC Managed Care – PPO | Attending: Physical Medicine and Rehabilitation | Admitting: Physical Medicine and Rehabilitation

## 2022-09-11 DIAGNOSIS — D509 Iron deficiency anemia, unspecified: Secondary | ICD-10-CM | POA: Insufficient documentation

## 2022-09-11 DIAGNOSIS — S88111A Complete traumatic amputation at level between knee and ankle, right lower leg, initial encounter: Secondary | ICD-10-CM | POA: Insufficient documentation

## 2022-09-11 NOTE — Progress Notes (Deleted)
Subjective:    Patient ID: Valerie Le, female    DOB: January 26, 1971, 52 y.o.   MRN: [id number]  HPI Valerie Le is a 52 year old woman who presents for follow-up up right BKA.   1) Right BKA Walking and driving Ambulating without cane No pain with walking.  No medications are helpful.  -she took hydrocodone in the past -she is taking oxycodone 5mg  but this is causing her nausea  2) Constipation -getting a little.   3) iron level low -this is making her more constipated  Pain Inventory Average Pain 6 Pain Right Now 4 My pain is constant and aching  In the last 24 hours, has pain interfered with the following? General activity 4 Relation with others 4 Enjoyment of life 5 What TIME of day is your pain at its worst? daytime, evening, and night Sleep (in general) Good  Pain is worse with: walking and standing Pain improves with: medication Relief from Meds: 6     No family history on file. Social History   Socioeconomic History   Marital status: Married    Spouse name: Not on file   Number of children: Not on file   Years of education: Not on file   Highest education level: Not on file  Occupational History   Not on file  Tobacco Use   Smoking status: Every Day    Packs/day: 0.25    Years: 25.00    Additional pack years: 0.00    Total pack years: 6.25    Types: Cigarettes   Smokeless tobacco: Never   Tobacco comments:    Cut down on smoking while on Chantix-  Vaping Use   Vaping Use: Never used  Substance and Sexual Activity   Alcohol use: Not Currently    Comment: Once a year on New Year's Day   Drug use: Not Currently   Sexual activity: Yes    Birth control/protection: Implant    Comment: Essure Implant  Other Topics Concern   Not on file  Social History Narrative   Not on file   Social Determinants of Health   Financial Resource Strain: Not on file  Food Insecurity: Not on file  Transportation Needs: Not on file   Physical Activity: Not on file  Stress: Not on file  Social Connections: Not on file   Past Surgical History:  Procedure Laterality Date   AMPUTATION Right 12/22/2020   Procedure: RIGHT TRANSMETATARSAL AMPUTATION;  Surgeon: Newt Minion, MD;  Location: [place];  Service: Orthopedics;  Laterality: Right;   AMPUTATION Right 06/29/2021   Procedure: REVISION RIGHT TRANSMETATARSAL AMPUTATION;  Surgeon: Newt Minion, MD;  Location: [place];  Service: Orthopedics;  Laterality: Right;   AMPUTATION Right 11/11/2021   Procedure: RIGHT BELOW KNEE AMPUTATION;  Surgeon: Newt Minion, MD;  Location: [place];  Service: Orthopedics;  Laterality: Right;   AMPUTATION TOE Right 12/04/2020   Procedure: AMPUTATION RIGHT SECOND TOE, I&D FOOT;  Surgeon: Marybelle Killings, MD;  Location: [facility];  Service: Orthopedics;  Laterality: Right;  Right second toe amputation, I&D right foot.   APPLICATION OF WOUND VAC  11/11/2021   Procedure: APPLICATION OF WOUND VAC;  Surgeon: Newt Minion, MD;  Location: [place];  Service: Orthopedics;;   birth control implant  07/31/2006   Essure Implant   TONSILLECTOMY     TYMPANOSTOMY TUBE PLACEMENT     WISDOM TOOTH EXTRACTION     Past Medical History:  Diagnosis Date  Anemia    Asthma    as a child   Depression    Diabetes mellitus without complication ([place])    Type 1 - Has Insulin Pump   Headache    migraines   High cholesterol    Hypertension    There were no vitals taken for this visit.  Opioid Risk Score:   Fall Risk Score:  `1  Depression screen [facility] 2/9     06/13/2022    8:19 AM 05/08/2022    1:42 PM 06/22/2021    9:27 AM 02/01/2021    2:05 PM  Depression screen PHQ 2/9  Decreased Interest 0 0 0 0  Down, Depressed, Hopeless 0 2 0 0  PHQ - 2 Score 0 2 0 0  Altered sleeping  2    Tired, decreased energy  0    Change in appetite  1    Feeling bad or failure about yourself   2    Trouble concentrating  1    Moving slowly or fidgety/restless  0    Suicidal  thoughts  0    PHQ-9 Score  8    Difficult doing work/chores  Somewhat difficult        Review of Systems  Musculoskeletal:  Positive for gait problem.  All other systems reviewed and are negative.     Objective:   Physical Exam Gen: no distress, normal appearing, weight 278 lbs,  HEENT: oral mucosa pink and moist, NCAT Cardio: Reg rate Chest: normal effort, normal rate of breathing Abd: soft, non-distended Ext: no edema Psych: pleasant, normal affect Skin: intact Neuro: Alert and oriented x3.  MSK: right BKA.       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 Discussed that this is a capsaicin patch, stronger than capsaicin cream. Discussed that it is currently approved for diabetic peripheral neuropathy and post-herpetic neuralgia, but that it has also shown benefit in treating other forms of neuropathy. Provided patient with link to site to learn more about the patch: CinemaBonus.fr. Discussed that the patch would be placed in office and benefits usually last 3 months. Discussed that unintended exposure to capsaicin can cause severe irritation of eyes, mucous membranes, respiratory tract, and skin, but that Qutenza is a local treatment and does not have the systemic side effects of other nerve medications. Discussed that there may be pain, itching, erythema, and decreased sensory function associated with the application of Qutenza. Side effects usually subside within 1 week. A cold pack of analgesic medications can help with these side effects. Blood pressure can also be increased due to pain associated with administration of the patch.   -UDS and pain contract performed previously.  -prescribed Norco 5mg  daily PRN.  -Discussed current symptoms of pain and history of pain.  -Discussed benefits of exercise in reducing pain. -Discussed  following foods that may reduce pain: 1) Ginger (especially studied for arthritis)- reduce leukotriene production to decrease inflammation 2) Blueberries- high in phytonutrients that decrease inflammation 3) Salmon- marine omega-3s reduce joint swelling and pain 4) Pumpkin seeds- reduce inflammation 5) dark chocolate- reduces inflammation 6) turmeric- reduces inflammation 7) tart cherries - reduce pain and stiffness 8) extra virgin olive oil - its compound olecanthal helps to block prostaglandins  9) chili peppers- can be eaten or applied topically via capsaicin 10) mint- helpful for headache, muscle aches, joint pain, and itching 11)  garlic- reduces inflammation  Link to further information on diet for chronic pain: http://www.randall.com/   2) .Constipation:  -Provided list of following foods that help with constipation and highlighted a few: 1) prunes- contain high amounts of fiber.  2) apples- has a form of dietary fiber called pectin that accelerates stool movement and increases beneficial gut bacteria 3) pears- in addition to fiber, also high in fructose and sorbitol which have laxative effect 4) figs- contain an enzyme ficin which helps to speed colonic transit 5) kiwis- contain an enzyme actinidin that improves gut motility and reduces constipation 6) oranges- rich in pectin (like apples) 7) grapefruits- contain a flavanol naringenin which has a laxative effect 8) vegetables- rich in fiber and also great sources of folate, vitamin C, and K 9) artichoke- high in inulin, prebiotic great for the microbiome 10) chicory- increases stool frequency and softness (can be added to coffee) 11) rhubarb- laxative effect 12) sweet potato- high fiber 13) beans, peas, and lentils- contain both soluble and insoluble fiber 14) chia seeds- improves intestinal health and gut flora 15) flaxseeds- laxative effect 16) whole grain  rye bread- high in fiber 17) oat bran- high in soluble and insoluble fiber 18) kefir- softens stools -recommended to try at least one of these foods every day.  -drink 6-8 glasses of water per day -walk regularly, especially after meals.  -discussed eating iron rich foods to help her decrease her iron dose.

## 2022-11-12 ENCOUNTER — Telehealth: Payer: Self-pay | Admitting: Orthopedic Surgery

## 2022-11-12 MED ORDER — DOXYCYCLINE HYCLATE 50 MG PO CAPS: 100.0000 mg | ORAL_CAPSULE | Freq: Two times a day (BID) | ORAL | 0 refills | Status: AC

## 2022-11-12 NOTE — Telephone Encounter (Signed)
Orthopedic Telephone Call  Spoke to patient this morning.  She said she went out of the house with her prosthetic but was not wearing the black sleeve.  She came back to the house and noticed a pressure ulcer over the distal aspect of the remaining limb.  He said that there was a small amount of clear drainage from the area.  It has been draining a small amount since she first noted the pressure ulcer.  She has not had any fevers or chills.  She has over 1 year out from surgery.  I advised her to keep pressure off of the area.  She should not be wearing the prosthetic or the sleeves and just keep pressure off the wound.  I told her to do wet-to-dry dressings over the ulcer and to change them daily.  She said she was familiar with that type of dressing change.  I prescribed her doxycycline.  I advised her to follow-up with Dr. Lajoyce Corners sometime next week to look at the wound.  London Sheer, MD

## 2022-11-14 ENCOUNTER — Encounter
Payer: BC Managed Care – PPO | Attending: Physical Medicine and Rehabilitation | Admitting: Physical Medicine and Rehabilitation

## 2022-11-14 VITALS — BP 147/79 | HR 84 | Ht 71.0 in | Wt 266.0 lb

## 2022-11-14 DIAGNOSIS — G894 Chronic pain syndrome: Secondary | ICD-10-CM | POA: Diagnosis not present

## 2022-11-14 DIAGNOSIS — L8990 Pressure ulcer of unspecified site, unspecified stage: Secondary | ICD-10-CM

## 2022-11-14 MED ORDER — HYDROCODONE-ACETAMINOPHEN 10-325 MG PO TABS: 1.0000 | ORAL_TABLET | Freq: Two times a day (BID) | ORAL | 0 refills | Status: DC | PRN

## 2022-11-14 NOTE — Progress Notes (Signed)
Subjective:    Patient ID: Valerie Le, female    DOB: May 24, 1971, 52 y.o.   MRN: [id number]  HPI Valerie Le is a 52 year old woman who presents for f/u right BKA.   1) Right BKA Walking and driving Ambulating without cane No pain with walking.  No medications are helpful.  -she took hydrocodone in the past -she was taking oxycodone 5mg  but this is causing her nausea -she had weaned herself off the hydrocodone but unfortunately has been having pain from her new prosthesis and this medication is no longer providing enough relief -she is interested in trying a higher dose and frequency of the medication -she has followed up with [facility] to have her prosthesis adjusted  2) Constipation -getting a little.   3) iron level low -this is making her more constipated  4) Pressure injury: -she has developed a pressure injury.   Pain Inventory Average Pain 7 Pain Right Now 9 My pain is constant and burning  In the last 24 hours, has pain interfered with the following? General activity 1 Relation with others 1 Enjoyment of life 1 What TIME of day is your pain at its worst? daytime Sleep (in general) Fair  Pain is worse with: walking and standing Pain improves with:  nothing Relief from Meds: 0     No family history on file. Social History   Socioeconomic History   Marital status: Married    Spouse name: Not on file   Number of children: Not on file   Years of education: Not on file   Highest education level: Not on file  Occupational History   Not on file  Tobacco Use   Smoking status: Every Day    Packs/day: 0.25    Years: 25.00    Additional pack years: 0.00    Total pack years: 6.25    Types: Cigarettes   Smokeless tobacco: Never   Tobacco comments:    Cut down on smoking while on Chantix-  Vaping Use   Vaping Use: Never used  Substance and Sexual Activity   Alcohol use: Not Currently    Comment: Once a year on New Year's Day   Drug  use: Not Currently   Sexual activity: Yes    Birth control/protection: Implant    Comment: Essure Implant  Other Topics Concern   Not on file  Social History Narrative   Not on file   Social Determinants of Health   Financial Resource Strain: Not on file  Food Insecurity: Not on file  Transportation Needs: Not on file  Physical Activity: Not on file  Stress: Not on file  Social Connections: Not on file   Past Surgical History:  Procedure Laterality Date   AMPUTATION Right 12/22/2020   Procedure: RIGHT TRANSMETATARSAL AMPUTATION;  Surgeon: [name], MD;  Location: [facility];  Service: Orthopedics;  Laterality: Right;   AMPUTATION Right 06/29/2021   Procedure: REVISION RIGHT TRANSMETATARSAL AMPUTATION;  Surgeon: [name], MD;  Location: [facility] OR;  Service: Orthopedics;  Laterality: Right;   AMPUTATION Right 11/11/2021   Procedure: RIGHT BELOW KNEE AMPUTATION;  Surgeon: [name], MD;  Location: [facility] OR;  Service: Orthopedics;  Laterality: Right;   AMPUTATION TOE Right 12/04/2020   Procedure: AMPUTATION RIGHT SECOND TOE, I&D FOOT;  Surgeon: Eldred Manges, MD;  Location: [facility];  Service: Orthopedics;  Laterality: Right;  Right second toe amputation, I&D right foot.   APPLICATION OF WOUND VAC  11/11/2021  Procedure: APPLICATION OF WOUND VAC;  Surgeon: [name], MD;  Location[facility];  Service: Orthopedics;;   birth control implant  07/31/2006   Essure Implant   TONSILLECTOMY     TYMPANOSTOMY TUBE PLACEMENT     WISDOM TOOTH EXTRACTION     Past Medical History:  Diagnosis Date   Anemia    Asthma    as a child   Depression    Diabetes mellitus without complication (HCC)    Type 1 - Has Insulin Pump   Headache    migraines   High cholesterol    Hypertension    BP (!) 147/79   Pulse 84   Ht 5\' 11"  (1.803 m)   Wt 266 lb (120.7 kg)   SpO2 97%   BMI 37.10 kg/m   Opioid Risk Score:   Fall Risk Score:  `1  Depression screen [facility] 2/9     06/13/2022    8:19  AM 05/08/2022    1:42 PM 06/22/2021    9:27 AM 02/01/2021    2:05 PM  Depression screen PHQ 2/9  Decreased Interest 0 0 0 0  Down, Depressed, Hopeless 0 2 0 0  PHQ - 2 Score 0 2 0 0  Altered sleeping  2    Tired, decreased energy  0    Change in appetite  1    Feeling bad or failure about yourself   2    Trouble concentrating  1    Moving slowly or fidgety/restless  0    Suicidal thoughts  0    PHQ-9 Score  8    Difficult doing work/chores  Somewhat difficult        Review of Systems  Musculoskeletal:  Positive for gait problem.  All other systems reviewed and are negative.     Objective:   Physical Exam Gen: no distress, normal appearing HEENT: oral mucosa pink and moist, NCAT Cardio: Reg rate Chest: normal effort, normal rate of breathing Abd: soft, non-distended Ext: no edema Psych: pleasant, normal affect Skin: intact Neuro: Alert and oriented x3.  MSK: right BKA.       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 Discussed that this is a capsaicin patch, stronger than capsaicin cream. Discussed that it is currently approved for diabetic peripheral neuropathy and post-herpetic neuralgia, but that it has also shown benefit in treating other forms of neuropathy. Provided patient with link to site to learn more about the patch: https://www.clark.biz/. Discussed that the patch would be placed in office and benefits usually last 3 months. Discussed that unintended exposure to capsaicin can cause severe irritation of eyes, mucous membranes, respiratory tract, and skin, but that Qutenza is a local treatment and does not have the systemic side effects of other nerve medications. Discussed that there may be pain, itching, erythema, and decreased sensory function associated with the application of Qutenza. Side effects usually subside within 1  week. A cold pack of analgesic medications can help with these side effects. Blood pressure can also be increased due to pain associated with administration of the patch.   -UDS and pain contract performed previously.  -increase norco to 10mg  BID PRN -Discussed current symptoms of pain and history of pain.  -Discussed benefits of exercise in reducing pain. -Discussed following foods that may reduce pain: 1) Ginger (especially studied for arthritis)- reduce leukotriene production to decrease  inflammation 2) Blueberries- high in phytonutrients that decrease inflammation 3) Salmon- marine omega-3s reduce joint swelling and pain 4) Pumpkin seeds- reduce inflammation 5) dark chocolate- reduces inflammation 6) turmeric- reduces inflammation 7) tart cherries - reduce pain and stiffness 8) extra virgin olive oil - its compound olecanthal helps to block prostaglandins  9) chili peppers- can be eaten or applied topically via capsaicin 10) mint- helpful for headache, muscle aches, joint pain, and itching 11) garlic- reduces inflammation  Link to further information on diet for chronic pain: http://www.bray.com/   2) .Constipation:  -Provided list of following foods that help with constipation and highlighted a few: 1) prunes- contain high amounts of fiber.  2) apples- has a form of dietary fiber called pectin that accelerates stool movement and increases beneficial gut bacteria 3) pears- in addition to fiber, also high in fructose and sorbitol which have laxative effect 4) figs- contain an enzyme ficin which helps to speed colonic transit 5) kiwis- contain an enzyme actinidin that improves gut motility and reduces constipation 6) oranges- rich in pectin (like apples) 7) grapefruits- contain a flavanol naringenin which has a laxative effect 8) vegetables- rich in fiber and also great sources of folate, vitamin C, and K 9)  artichoke- high in inulin, prebiotic great for the microbiome 10) chicory- increases stool frequency and softness (can be added to coffee) 11) rhubarb- laxative effect 12) sweet potato- high fiber 13) beans, peas, and lentils- contain both soluble and insoluble fiber 14) chia seeds- improves intestinal health and gut flora 15) flaxseeds- laxative effect 16) whole grain rye bread- high in fiber 17) oat bran- high in soluble and insoluble fiber 18) kefir- softens stools -recommended to try at least one of these foods every day.  -drink 6-8 glasses of water per day -walk regularly, especially after meals.  -discussed eating iron rich foods to help her decrease her iron dose.     3) Pressure injury: -recommended red light therapy, collagen supplementation, application of manuka honey

## 2022-11-16 ENCOUNTER — Other Ambulatory Visit: Payer: Self-pay | Admitting: Orthopedic Surgery

## 2022-11-16 ENCOUNTER — Encounter: Payer: Self-pay | Admitting: Orthopedic Surgery

## 2022-11-16 ENCOUNTER — Ambulatory Visit (INDEPENDENT_AMBULATORY_CARE_PROVIDER_SITE_OTHER): Payer: BC Managed Care – PPO | Admitting: Orthopedic Surgery

## 2022-11-16 DIAGNOSIS — L97916 Non-pressure chronic ulcer of unspecified part of right lower leg with bone involvement without evidence of necrosis: Secondary | ICD-10-CM | POA: Diagnosis not present

## 2022-11-16 DIAGNOSIS — Z89511 Acquired absence of right leg below knee: Secondary | ICD-10-CM | POA: Diagnosis not present

## 2022-11-16 DIAGNOSIS — S88111D Complete traumatic amputation at level between knee and ankle, right lower leg, subsequent encounter: Secondary | ICD-10-CM

## 2022-11-16 MED ORDER — SULFAMETHOXAZOLE-TRIMETHOPRIM 800-160 MG PO TABS
1.0000 | ORAL_TABLET | Freq: Two times a day (BID) | ORAL | 0 refills | Status: DC
Start: 2022-11-16 — End: 2023-10-29

## 2022-11-16 MED ORDER — SULFAMETHOXAZOLE-TRIMETHOPRIM 800-160 MG PO TABS: 1.0000 | ORAL_TABLET | Freq: Two times a day (BID) | ORAL | 0 refills | Status: DC

## 2022-11-16 NOTE — Progress Notes (Signed)
Office Visit Note   Patient: Valerie Le           Date of Birth: Apr 27, 1971           MRN: [id number] Visit Date: 11/16/2022              Requested by: Maud Deed, [place] [address] [place],  [place] [address] PCP: Maud Deed, [place]  Chief Complaint  Patient presents with   Right Leg - Wound Check    Right BKA pressure ulcer      HPI: Patient is a 52 year old woman who is over a year out from below-knee amputation right.  Patient developed an ulcer from her old socket she has a new socket at this time she states that the ulcer started on May 17.  Patient was started on doxycycline over the weekend.  Assessment & Plan: Visit Diagnoses:  1. Below-knee amputation of right lower extremity, subsequent encounter (HCC)   2. Ulcer of right lower extremity with bone involvement without evidence of necrosis (HCC)     Plan: A prescription for Bactrim DS is sent to her pharmacy.  Reevaluate in 1 week.  Obtain 2 view radiographs of the right leg at follow-up.  Discussed with the ulcer extending to bone we may need to proceed with revision amputation.  Follow-Up Instructions: Return in about 1 week (around 11/23/2022).   Ortho Exam  Patient is alert, oriented, no adenopathy, well-dressed, normal affect, normal respiratory effort. Examination there is no cellulitis.  There is clear drainage from a wound over the distal aspect of the right below-knee amputation.  Ulcer secondary to pressure from her prosthesis.  With a sterile Q-tip the ulcer probes to bone.  Imaging: No results found.   Labs: Lab Results  Component Value Date   HGBA1C 7.4 (H) 11/11/2021   HGBA1C 13.1 (H) 12/02/2020   ESRSEDRATE 84 (H) 11/02/2021   ESRSEDRATE 72 (H) 06/15/2021   CRP 24.7 (H) 06/15/2021   REPTSTATUS 11/07/2021 FINAL 11/02/2021   GRAMSTAIN  06/29/2021    RARE WBC PRESENT, PREDOMINANTLY MONONUCLEAR NO ORGANISMS SEEN    CULT  11/02/2021    NO GROWTH 5  DAYS Performed at [facility], [address] [place], [place] [address]    [facility] [address] STAPHYLOCOCCUS EPIDERMIDIS 06/29/2021   LABORGA STAPHYLOCOCCUS LUGDUNENSIS 06/29/2021     Lab Results  Component Value Date   ALBUMIN 1.9 (L) 11/17/2021   ALBUMIN 2.1 (L) 11/03/2021   ALBUMIN 2.6 (L) 12/03/2020   PREALBUMIN 11.4 (L) 11/11/2021    Lab Results  Component Value Date   MG 1.9 11/11/2021   Lab Results  Component Value Date   VD25OH 33.19 11/17/2021   VD25OH 32.37 11/11/2021    Lab Results  Component Value Date   PREALBUMIN 11.4 (L) 11/11/2021      Latest Ref Rng & Units 11/21/2021    6:07 AM 11/17/2021    5:20 AM 11/13/2021    3:13 AM  CBC EXTENDED  WBC 4.0 - 10.5 K/uL 12.8  14.7  17.7   RBC 3.87 - 5.11 MIL/uL 3.38  3.25  3.59   Hemoglobin 12.0 - 15.0 g/dL 7.7  7.2  8.1   HCT 62.1 - 46.0 % 23.4  22.7  25.1   Platelets 150 - 400 K/uL 363  331  437   NEUT# 1.7 - 7.7 K/uL  9.3    Lymph# 0.7 - 4.0 K/uL  3.2       There is no  height or weight on file to calculate BMI.  Orders:  No orders of the defined types were placed in this encounter.  No orders of the defined types were placed in this encounter.    Procedures: No procedures performed  Clinical Data: No additional findings.  ROS:  All other systems negative, except as noted in the HPI. Review of Systems  Objective: Vital Signs: There were no vitals taken for this visit.  Specialty Comments:  No specialty comments available.  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 of skin (HCC)    Chest pain 11/02/2021   Elevated troponin 11/02/2021   SIRS (systemic inflammatory response syndrome) (HCC) 11/02/2021   Diabetes mellitus type 1 (HCC)  11/02/2021   Thrombocytosis 11/02/2021   Microcytic hypochromic anemia 11/02/2021   Hypokalemia 11/02/2021   Morbid obesity (HCC) 11/02/2021   Medication monitoring encounter 06/15/2021   Smoking 02/01/2021   Acute hematogenous osteomyelitis of right foot (HCC) 12/22/2020   Osteomyelitis of foot, right, acute (HCC)    Dehiscence of amputation stump (HCC)    Diabetic foot infection (HCC) 12/02/2020   Essential hypertension 12/02/2020   Hyperlipidemia 08/21/2018   Bipolar affective disorder, currently depressed, moderate (HCC) 01/23/2017   Insulin-treated type 2 diabetes mellitus (HCC) 03/07/2013   Asthma 03/07/2013   ADHD (attention deficit hyperactivity disorder) 03/07/2013   Migraine 03/07/2013   Tobacco abuse 03/07/2013   Past Medical History:  Diagnosis Date   Anemia    Asthma    as a child   Depression    Diabetes mellitus without complication (HCC)    Type 1 - Has Insulin Pump   Headache    migraines   High cholesterol    Hypertension     No family history on file.  Past Surgical History:  Procedure Laterality Date   AMPUTATION Right 12/22/2020   Procedure: RIGHT TRANSMETATARSAL AMPUTATION;  Surgeon: [name], MD;  Location: [facility] OR;  Service: Orthopedics;  Laterality: Right;   AMPUTATION Right 06/29/2021   Procedure: REVISION RIGHT TRANSMETATARSAL AMPUTATION;  Surgeon: [name], MD;  Location: [facility];  Service: Orthopedics;  Laterality: Right;   AMPUTATION Right 11/11/2021   Procedure: RIGHT BELOW KNEE AMPUTATION;  Surgeon: [name], MD;  Location: [facility];  Service: Orthopedics;  Laterality: Right;   AMPUTATION TOE Right 12/04/2020   Procedure: AMPUTATION RIGHT SECOND TOE, I&D FOOT;  Surgeon: Eldred Manges, MD;  Location: [facility];  Service: Orthopedics;  Laterality: Right;  Right second toe amputation, I&D right foot.   APPLICATION OF WOUND VAC  11/11/2021   Procedure: APPLICATION OF WOUND VAC;  Surgeon: [name], MD;  Location: [facility];  Service:  Orthopedics;;   birth control implant  07/31/2006   Essure Implant   TONSILLECTOMY     TYMPANOSTOMY TUBE PLACEMENT     WISDOM TOOTH EXTRACTION     Social History   Occupational History   Not on file  Tobacco Use   Smoking status: Every Day    Packs/day: 0.25    Years: 25.00    Additional pack years: 0.00    Total pack years: 6.25    Types: Cigarettes   Smokeless tobacco: Never   Tobacco comments:    Cut down on smoking while  on Chantix-  Vaping Use   Vaping Use: Never used  Substance and Sexual Activity   Alcohol use: Not Currently    Comment: Once a year on New Year's Day   Drug use: Not Currently   Sexual activity: Yes    Birth control/protection: Implant    Comment: Essure Implant

## 2022-11-16 NOTE — Addendum Note (Signed)
Addended by: Aldean Baker on: 11/16/2022 04:38 PM   Modules accepted: Orders

## 2022-11-16 NOTE — Addendum Note (Signed)
Addended by: Javier Glazier on: 11/16/2022 03:39 PM   Modules accepted: Orders

## 2022-11-17 ENCOUNTER — Telehealth: Payer: Self-pay | Admitting: Orthopedic Surgery

## 2022-11-17 ENCOUNTER — Other Ambulatory Visit: Payer: Self-pay

## 2022-11-17 ENCOUNTER — Encounter: Payer: Self-pay | Admitting: Orthopedic Surgery

## 2022-11-17 NOTE — Telephone Encounter (Signed)
Patient called asked if she can get a modified note stating she can work with restrictions for light duty. 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  The number to contact patient is [phone]

## 2022-11-17 NOTE — Telephone Encounter (Signed)
Responded to mycahrt message pt sent this morning. Letter at the front desk for pick up.

## 2022-11-24 ENCOUNTER — Encounter: Payer: Self-pay | Admitting: Family

## 2022-11-24 ENCOUNTER — Ambulatory Visit (INDEPENDENT_AMBULATORY_CARE_PROVIDER_SITE_OTHER): Payer: BC Managed Care – PPO | Admitting: Family

## 2022-11-24 DIAGNOSIS — S88111D Complete traumatic amputation at level between knee and ankle, right lower leg, subsequent encounter: Secondary | ICD-10-CM

## 2022-11-24 NOTE — Progress Notes (Signed)
Office Visit Note   Patient: Valerie Le           Date of Birth: Nov 16, 1970           MRN: [id number] Visit Date: 11/24/2022              Requested by: Maud Deed, [place] [address] [place],  [place] [address] PCP: Maud Deed, [place]  Chief Complaint  Patient presents with   Right Leg - Follow-up    Hx right BKA 11/12/2022       HPI: Patient is a 52 year old woman who is over a year out from below-knee amputation right.   She initially had issues with pressure from end bearing in her socket did have fabrication of a new prosthesis socket and subsequently developed a anterior shin ulcer from pressure in her new socket.  She has resumed her old socket and is working at [employer] at [employer].  Has been using a Software engineer with direct skin contact   Assessment & Plan: Visit Diagnoses:  No diagnosis found.   Plan: She will continue her Bactrim DS.  Pack wound open with silver cell wear shrinker around-the-clock.  Will not be using her prosthesis until ulcer heals.  Reevaluate in 2 week.    Obtain 2 view radiographs of the right leg at follow-up.   Follow-Up Instructions: No follow-ups on file.   Ortho Exam  Patient is alert, oriented, no adenopathy, well-dressed, normal affect, normal respiratory effort. Examination there is no cellulitis.  The ulcer is 15 mm in diameter with full granulation.  There is some undermining towards the midline about 5 mm this does not probe to bone today there is no surrounding sign of infection or active drainage  Imaging: No results found.   Labs: Lab Results  Component Value Date   HGBA1C 7.4 (H) 11/11/2021   HGBA1C 13.1 (H) 12/02/2020   ESRSEDRATE 84 (H) 11/02/2021   ESRSEDRATE 72 (H) 06/15/2021   CRP 24.7 (H) 06/15/2021   REPTSTATUS 11/07/2021 FINAL 11/02/2021   GRAMSTAIN  06/29/2021    RARE WBC PRESENT, PREDOMINANTLY MONONUCLEAR NO ORGANISMS SEEN    CULT  11/02/2021    NO GROWTH 5  DAYS Performed at [facility] Lab, [address] [place], [place] [address]    [facility] STAPHYLOCOCCUS EPIDERMIDIS 06/29/2021   LABORGA STAPHYLOCOCCUS LUGDUNENSIS 06/29/2021     Lab Results  Component Value Date   ALBUMIN 1.9 (L) 11/17/2021   ALBUMIN 2.1 (L) 11/03/2021   ALBUMIN 2.6 (L) 12/03/2020   PREALBUMIN 11.4 (L) 11/11/2021    Lab Results  Component Value Date   MG 1.9 11/11/2021   Lab Results  Component Value Date   VD25OH 33.19 11/17/2021   VD25OH 32.37 11/11/2021    Lab Results  Component Value Date   PREALBUMIN 11.4 (L) 11/11/2021      Latest Ref Rng & Units 11/21/2021    6:07 AM 11/17/2021    5:20 AM 11/13/2021    3:13 AM  CBC EXTENDED  WBC 4.0 - 10.5 K/uL 12.8  14.7  17.7   RBC 3.87 - 5.11 MIL/uL 3.38  3.25  3.59   Hemoglobin 12.0 - 15.0 g/dL 7.7  7.2  8.1   HCT 62.1 - 46.0 % 23.4  22.7  25.1   Platelets 150 - 400 K/uL 363  331  437   NEUT# 1.7 - 7.7 K/uL  9.3    Lymph# 0.7 - 4.0 K/uL  3.2  There is no height or weight on file to calculate BMI.  Orders:  No orders of the defined types were placed in this encounter.  No orders of the defined types were placed in this encounter.    Procedures: No procedures performed  Clinical Data: No additional findings.  ROS:  All other systems negative, except as noted in the HPI. Review of Systems  Objective: Vital Signs: There were no vitals taken for this visit.  Specialty Comments:  No specialty comments available.  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 of skin (HCC)    Chest pain 11/02/2021   Elevated troponin 11/02/2021   SIRS (systemic inflammatory response syndrome) (HCC) 11/02/2021   Diabetes mellitus type 1 (HCC)  11/02/2021   Thrombocytosis 11/02/2021   Microcytic hypochromic anemia 11/02/2021   Hypokalemia 11/02/2021   Morbid obesity (HCC) 11/02/2021   Medication monitoring encounter 06/15/2021   Smoking 02/01/2021   Acute hematogenous osteomyelitis of right foot (HCC) 12/22/2020   Osteomyelitis of foot, right, acute (HCC)    Dehiscence of amputation stump (HCC)    Diabetic foot infection (HCC) 12/02/2020   Essential hypertension 12/02/2020   Hyperlipidemia 08/21/2018   Bipolar affective disorder, currently depressed, moderate (HCC) 01/23/2017   Insulin-treated type 2 diabetes mellitus (HCC) 03/07/2013   Asthma 03/07/2013   ADHD (attention deficit hyperactivity disorder) 03/07/2013   Migraine 03/07/2013   Tobacco abuse 03/07/2013   Past Medical History:  Diagnosis Date   Anemia    Asthma    as a child   Depression    Diabetes mellitus without complication (HCC)    Type 1 - Has Insulin Pump   Headache    migraines   High cholesterol    Hypertension     No family history on file.  Past Surgical History:  Procedure Laterality Date   AMPUTATION Right 12/22/2020   Procedure: RIGHT TRANSMETATARSAL AMPUTATION;  Surgeon: [name], MD;  Location: [facility] OR;  Service: Orthopedics;  Laterality: Right;   AMPUTATION Right 06/29/2021   Procedure: REVISION RIGHT TRANSMETATARSAL AMPUTATION;  Surgeon: [name], MD;  Location: [facility] OR;  Service: Orthopedics;  Laterality: Right;   AMPUTATION Right 11/11/2021   Procedure: RIGHT BELOW KNEE AMPUTATION;  Surgeon: [name], MD;  Location: [facility] OR;  Service: Orthopedics;  Laterality: Right;   AMPUTATION TOE Right 12/04/2020   Procedure: AMPUTATION RIGHT SECOND TOE, I&D FOOT;  Surgeon: Eldred Manges, MD;  Location: [facility] ORS;  Service: Orthopedics;  Laterality: Right;  Right second toe amputation, I&D right foot.   APPLICATION OF WOUND VAC  11/11/2021   Procedure: APPLICATION OF WOUND VAC;  Surgeon: [name], MD;  Location: [facility] OR;  Service:  Orthopedics;;   birth control implant  07/31/2006   Essure Implant   TONSILLECTOMY     TYMPANOSTOMY TUBE PLACEMENT     WISDOM TOOTH EXTRACTION     Social History   Occupational History   Not on file  Tobacco Use   Smoking status: Every Day    Packs/day: 0.25    Years: 25.00    Additional pack years: 0.00    Total pack years: 6.25    Types: Cigarettes   Smokeless tobacco: Never   Tobacco comments:    Cut down  on smoking while on Chantix-  Vaping Use   Vaping Use: Never used  Substance and Sexual Activity   Alcohol use: Not Currently    Comment: Once a year on New Year's Day   Drug use: Not Currently   Sexual activity: Yes    Birth control/protection: Implant    Comment: Essure Implant

## 2022-12-07 ENCOUNTER — Ambulatory Visit (INDEPENDENT_AMBULATORY_CARE_PROVIDER_SITE_OTHER): Payer: BC Managed Care – PPO | Admitting: Orthopedic Surgery

## 2022-12-07 DIAGNOSIS — Z89511 Acquired absence of right leg below knee: Secondary | ICD-10-CM | POA: Diagnosis not present

## 2022-12-07 DIAGNOSIS — S88111D Complete traumatic amputation at level between knee and ankle, right lower leg, subsequent encounter: Secondary | ICD-10-CM

## 2022-12-25 ENCOUNTER — Encounter: Payer: Self-pay | Admitting: Orthopedic Surgery

## 2022-12-25 NOTE — Progress Notes (Signed)
Office Visit Note   Patient: Valerie Le           Date of Birth: Oct 25, 1970           MRN: [id number] Visit Date: 12/07/2022              Requested by: Maud Deed, [place] [address] [place],  [place] [address] PCP: Maud Deed, [place]  Chief Complaint  Patient presents with   Right Leg - Follow-up    Hx 11/11/2021 right BKA       HPI: Patient is 1 year status post right below-knee amputation.  Assessment & Plan: Visit Diagnoses:  1. Below-knee amputation of right lower extremity, subsequent encounter (HCC)     Plan: Recommend continue wearing her prosthetic under liner.  Follow-Up Instructions: Return in about 4 weeks (around 01/04/2023).   Ortho Exam  Patient is alert, oriented, no adenopathy, well-dressed, normal affect, normal respiratory effort. Examination patient has an ulcer over the anterior lateral aspect of the residual limb.  There is 100% healthy granulation tissue the wound is 5 mm in diameter 1 mm deep without cellulitis or drainage.  Imaging: No results found. No images are attached to the encounter.  Labs: Lab Results  Component Value Date   HGBA1C 7.4 (H) 11/11/2021   HGBA1C 13.1 (H) 12/02/2020   ESRSEDRATE 84 (H) 11/02/2021   ESRSEDRATE 72 (H) 06/15/2021   CRP 24.7 (H) 06/15/2021   REPTSTATUS 11/07/2021 FINAL 11/02/2021   GRAMSTAIN  06/29/2021    RARE WBC PRESENT, PREDOMINANTLY MONONUCLEAR NO ORGANISMS SEEN    CULT  11/02/2021    NO GROWTH 5 DAYS Performed at [facility], [address] [place], [place] [address]    [facility] STAPHYLOCOCCUS EPIDERMIDIS 06/29/2021   LABORGA STAPHYLOCOCCUS LUGDUNENSIS 06/29/2021     Lab Results  Component Value Date   ALBUMIN 1.9 (L) 11/17/2021   ALBUMIN 2.1 (L) 11/03/2021   ALBUMIN 2.6 (L) 12/03/2020   PREALBUMIN 11.4 (L) 11/11/2021    Lab Results  Component Value Date   MG 1.9 11/11/2021   Lab Results  Component Value Date   VD25OH 33.19  11/17/2021   VD25OH 32.37 11/11/2021    Lab Results  Component Value Date   PREALBUMIN 11.4 (L) 11/11/2021      Latest Ref Rng & Units 11/21/2021    6:07 AM 11/17/2021    5:20 AM 11/13/2021    3:13 AM  CBC EXTENDED  WBC 4.0 - 10.5 K/uL 12.8  14.7  17.7   RBC 3.87 - 5.11 MIL/uL 3.38  3.25  3.59   Hemoglobin 12.0 - 15.0 g/dL 7.7  7.2  8.1   HCT 62.1 - 46.0 % 23.4  22.7  25.1   Platelets 150 - 400 K/uL 363  331  437   NEUT# 1.7 - 7.7 K/uL  9.3    Lymph# 0.7 - 4.0 K/uL  3.2       There is no height or weight on file to calculate BMI.  Orders:  No orders of the defined types were placed in this encounter.  No orders of the defined types were placed in this encounter.    Procedures: No procedures performed  Clinical Data: No additional findings.  ROS:  All other systems negative, except as noted in the HPI. Review of Systems  Objective: Vital Signs: There were no vitals taken for this visit.  Specialty Comments:  No specialty comments available.  PMFS History: Patient Active Problem List  Diagnosis Date Noted   S/P BKA (below knee amputation), right (HCC) 11/16/2021   Below-knee amputation of right lower extremity (HCC) 11/11/2021   Hypoalbuminemia 11/03/2021   Non-pressure chronic ulcer of other part of right foot limited to breakdown of skin (HCC)    Chest pain 11/02/2021   Elevated troponin 11/02/2021   SIRS (systemic inflammatory response syndrome) (HCC) 11/02/2021   Diabetes mellitus type 1 (HCC) 11/02/2021   Thrombocytosis 11/02/2021   Microcytic hypochromic anemia 11/02/2021   Hypokalemia 11/02/2021   Morbid obesity (HCC) 11/02/2021   Medication monitoring encounter 06/15/2021   Smoking 02/01/2021   Acute hematogenous osteomyelitis of right foot (HCC) 12/22/2020   Osteomyelitis of foot, right, acute (HCC)    Dehiscence of amputation stump (HCC)    Diabetic foot infection (HCC) 12/02/2020   Essential hypertension 12/02/2020   Hyperlipidemia  08/21/2018   Bipolar affective disorder, currently depressed, moderate (HCC) 01/23/2017   Insulin-treated type 2 diabetes mellitus (HCC) 03/07/2013   Asthma 03/07/2013   ADHD (attention deficit hyperactivity disorder) 03/07/2013   Migraine 03/07/2013   Tobacco abuse 03/07/2013   Past Medical History:  Diagnosis Date   Anemia    Asthma    as a child   Depression    Diabetes mellitus without complication (HCC)    Type 1 - Has Insulin Pump   Headache    migraines   High cholesterol    Hypertension     History reviewed. No pertinent family history.  Past Surgical History:  Procedure Laterality Date   AMPUTATION Right 12/22/2020   Procedure: RIGHT TRANSMETATARSAL AMPUTATION;  Surgeon: [name], MD;  Location: [facility] OR;  Service: Orthopedics;  Laterality: Right;   AMPUTATION Right 06/29/2021   Procedure: REVISION RIGHT TRANSMETATARSAL AMPUTATION;  Surgeon: [name], MD;  Location: [facility] OR;  Service: Orthopedics;  Laterality: Right;   AMPUTATION Right 11/11/2021   Procedure: RIGHT BELOW KNEE AMPUTATION;  Surgeon: [name], MD;  Location: [facility] OR;  Service: Orthopedics;  Laterality: Right;   AMPUTATION TOE Right 12/04/2020   Procedure: AMPUTATION RIGHT SECOND TOE, I&D FOOT;  Surgeon: Eldred Manges, MD;  Location: [facility];  Service: Orthopedics;  Laterality: Right;  Right second toe amputation, I&D right foot.   APPLICATION OF WOUND VAC  11/11/2021   Procedure: APPLICATION OF WOUND VAC;  Surgeon: [name], MD;  Location: [facility] OR;  Service: Orthopedics;;   birth control implant  07/31/2006   Essure Implant   TONSILLECTOMY     TYMPANOSTOMY TUBE PLACEMENT     WISDOM TOOTH EXTRACTION     Social History   Occupational History   Not on file  Tobacco Use   Smoking status: Every Day    Packs/day: 0.25    Years: 25.00    Additional pack years: 0.00    Total pack years: 6.25    Types: Cigarettes   Smokeless tobacco: Never   Tobacco comments:    Cut down on smoking while  on Chantix-  Vaping Use   Vaping Use: Never used  Substance and Sexual Activity   Alcohol use: Not Currently    Comment: Once a year on New Year's Day   Drug use: Not Currently   Sexual activity: Yes    Birth control/protection: Implant    Comment: Essure Implant

## 2023-01-08 ENCOUNTER — Ambulatory Visit (INDEPENDENT_AMBULATORY_CARE_PROVIDER_SITE_OTHER): Payer: BC Managed Care – PPO | Admitting: Orthopedic Surgery

## 2023-01-08 DIAGNOSIS — Z89511 Acquired absence of right leg below knee: Secondary | ICD-10-CM | POA: Diagnosis not present

## 2023-01-08 DIAGNOSIS — S88111D Complete traumatic amputation at level between knee and ankle, right lower leg, subsequent encounter: Secondary | ICD-10-CM

## 2023-01-09 ENCOUNTER — Encounter: Payer: Self-pay | Admitting: Orthopedic Surgery

## 2023-01-09 ENCOUNTER — Encounter: Payer: Self-pay | Admitting: Physical Medicine and Rehabilitation

## 2023-01-09 ENCOUNTER — Encounter
Payer: BC Managed Care – PPO | Attending: Physical Medicine and Rehabilitation | Admitting: Physical Medicine and Rehabilitation

## 2023-01-09 VITALS — HR 81 | Ht 71.0 in

## 2023-01-09 DIAGNOSIS — G546 Phantom limb syndrome with pain: Secondary | ICD-10-CM | POA: Insufficient documentation

## 2023-01-09 DIAGNOSIS — L8990 Pressure ulcer of unspecified site, unspecified stage: Secondary | ICD-10-CM | POA: Diagnosis not present

## 2023-01-09 DIAGNOSIS — G894 Chronic pain syndrome: Secondary | ICD-10-CM | POA: Insufficient documentation

## 2023-01-09 DIAGNOSIS — W19XXXA Unspecified fall, initial encounter: Secondary | ICD-10-CM | POA: Insufficient documentation

## 2023-01-09 DIAGNOSIS — Z89511 Acquired absence of right leg below knee: Secondary | ICD-10-CM | POA: Insufficient documentation

## 2023-01-09 DIAGNOSIS — Z5189 Encounter for other specified aftercare: Secondary | ICD-10-CM | POA: Diagnosis not present

## 2023-01-09 DIAGNOSIS — M1990 Unspecified osteoarthritis, unspecified site: Secondary | ICD-10-CM | POA: Diagnosis not present

## 2023-01-09 DIAGNOSIS — R519 Headache, unspecified: Secondary | ICD-10-CM | POA: Diagnosis not present

## 2023-01-09 DIAGNOSIS — W19XXXS Unspecified fall, sequela: Secondary | ICD-10-CM

## 2023-01-09 DIAGNOSIS — E1140 Type 2 diabetes mellitus with diabetic neuropathy, unspecified: Secondary | ICD-10-CM | POA: Diagnosis not present

## 2023-01-09 DIAGNOSIS — K5900 Constipation, unspecified: Secondary | ICD-10-CM | POA: Insufficient documentation

## 2023-01-09 MED ORDER — HYDROCODONE-ACETAMINOPHEN 10-325 MG PO TABS: 1.0000 | ORAL_TABLET | Freq: Three times a day (TID) | ORAL | 0 refills | Status: DC | PRN

## 2023-01-09 MED ORDER — BENZONATATE 100 MG PO CAPS: 100.0000 mg | ORAL_CAPSULE | Freq: Three times a day (TID) | ORAL | 0 refills | Status: DC | PRN

## 2023-01-09 MED ORDER — GABAPENTIN 600 MG PO TABS
600.0000 mg | ORAL_TABLET | Freq: Every day | ORAL | 3 refills | Status: DC
Start: 2023-01-09 — End: 2023-10-19

## 2023-01-09 NOTE — Progress Notes (Signed)
Office Visit Note   Patient: Valerie Le           Date of Birth: 22-Dec-1970           MRN: [id number] Visit Date: 01/08/2023              Requested by: Maud Deed, [place] [address] [place],  [place] [address] PCP: Maud Deed, [place]  Chief Complaint  Patient presents with   Right Leg - Follow-up     Hx 11/11/2021 right BKA          HPI: Patient is over 1 year status post right below-knee amputation.  Patient is currently using a prosthetic under liner.  She has been using this to heal over the ulcer from subsiding into the socket.  Assessment & Plan: Visit Diagnoses:  1. Below-knee amputation of right lower extremity, subsequent encounter [facility])     Plan: The ulcer is healed she will continue using the prosthetic under liner.  She will follow-up with [facility] for a new socket.  Follow-Up Instructions: Return if symptoms worsen or fail to improve.   Ortho Exam  Patient is alert, oriented, no adenopathy, well-dressed, normal affect, normal respiratory effort. Examination the ulcer on the residual limb is healed there is no redness no cellulitis no signs of infection.  Patient is an existing right transtibial  amputee.  Patient's current comorbidities are not expected to impact the ability to function with the prescribed prosthesis. Patient verbally communicates a strong desire to use a prosthesis. Patient currently requires mobility aids to ambulate without a prosthesis.  Expects not to use mobility aids with a new prosthesis.  Patient is a K2 level ambulator that will use a prosthesis to walk around their home and the community over low level environmental barriers.      Imaging: No results found. No images are attached to the encounter.  Labs: Lab Results  Component Value Date   HGBA1C 7.4 (H) 11/11/2021   HGBA1C 13.1 (H) 12/02/2020   ESRSEDRATE 84 (H) 11/02/2021   ESRSEDRATE 72 (H) 06/15/2021   CRP 24.7 (H) 06/15/2021    REPTSTATUS 11/07/2021 FINAL 11/02/2021   GRAMSTAIN  06/29/2021    RARE WBC PRESENT, PREDOMINANTLY MONONUCLEAR NO ORGANISMS SEEN    CULT  11/02/2021    NO GROWTH 5 DAYS Performed at [facility], [address], [place], [place] [address]    [facility] STAPHYLOCOCCUS EPIDERMIDIS 06/29/2021   LABORGA STAPHYLOCOCCUS LUGDUNENSIS 06/29/2021     Lab Results  Component Value Date   ALBUMIN 1.9 (L) 11/17/2021   ALBUMIN 2.1 (L) 11/03/2021   ALBUMIN 2.6 (L) 12/03/2020   PREALBUMIN 11.4 (L) 11/11/2021    Lab Results  Component Value Date   MG 1.9 11/11/2021   Lab Results  Component Value Date   VD25OH 33.19 11/17/2021   VD25OH 32.37 11/11/2021    Lab Results  Component Value Date   PREALBUMIN 11.4 (L) 11/11/2021      Latest Ref Rng & Units 11/21/2021    6:07 AM 11/17/2021    5:20 AM 11/13/2021    3:13 AM  CBC EXTENDED  WBC 4.0 - 10.5 K/uL 12.8  14.7  17.7   RBC 3.87 - 5.11 MIL/uL 3.38  3.25  3.59   Hemoglobin 12.0 - 15.0 g/dL 7.7  7.2  8.1   HCT 41.3 - 46.0 % 23.4  22.7  25.1   Platelets 150 - 400 K/uL 363  331  437   NEUT#  1.7 - 7.7 K/uL  9.3    Lymph# 0.7 - 4.0 K/uL  3.2       There is no height or weight on file to calculate BMI.  Orders:  No orders of the defined types were placed in this encounter.  No orders of the defined types were placed in this encounter.    Procedures: No procedures performed  Clinical Data: No additional findings.  ROS:  All other systems negative, except as noted in the HPI. Review of Systems  Objective: Vital Signs: There were no vitals taken for this visit.  Specialty Comments:  No specialty comments available.  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 of skin  (HCC)    Chest pain 11/02/2021   Elevated troponin 11/02/2021   SIRS (systemic inflammatory response syndrome) (HCC) 11/02/2021   Diabetes mellitus type 1 (HCC) 11/02/2021   Thrombocytosis 11/02/2021   Microcytic hypochromic anemia 11/02/2021   Hypokalemia 11/02/2021   Morbid obesity (HCC) 11/02/2021   Medication monitoring encounter 06/15/2021   Smoking 02/01/2021   Acute hematogenous osteomyelitis of right foot (HCC) 12/22/2020   Osteomyelitis of foot, right, acute (HCC)    Dehiscence of amputation stump (HCC)    Diabetic foot infection (HCC) 12/02/2020   Essential hypertension 12/02/2020   Hyperlipidemia 08/21/2018   Bipolar affective disorder, currently depressed, moderate (HCC) 01/23/2017   Insulin-treated type 2 diabetes mellitus (HCC) 03/07/2013   Asthma 03/07/2013   ADHD (attention deficit hyperactivity disorder) 03/07/2013   Migraine 03/07/2013   Tobacco abuse 03/07/2013   Past Medical History:  Diagnosis Date   Anemia    Asthma    as a child   Depression    Diabetes mellitus without complication (HCC)    Type 1 - Has Insulin Pump   Headache    migraines   High cholesterol    Hypertension     History reviewed. No pertinent family history.  Past Surgical History:  Procedure Laterality Date   AMPUTATION Right 12/22/2020   Procedure: RIGHT TRANSMETATARSAL AMPUTATION;  Surgeon: [name], MD;  Location: [facility] OR;  Service: Orthopedics;  Laterality: Right;   AMPUTATION Right 06/29/2021   Procedure: REVISION RIGHT TRANSMETATARSAL AMPUTATION;  Surgeon: [name], MD;  Location: [facility] OR;  Service: Orthopedics;  Laterality: Right;   AMPUTATION Right 11/11/2021   Procedure: RIGHT BELOW KNEE AMPUTATION;  Surgeon: [name], MD;  Location: [facility] OR;  Service: Orthopedics;  Laterality: Right;   AMPUTATION TOE Right 12/04/2020   Procedure: AMPUTATION RIGHT SECOND TOE, I&D FOOT;  Surgeon: Eldred Manges, MD;  Location: [facility];  Service: Orthopedics;  Laterality: Right;   Right second toe amputation, I&D right foot.   APPLICATION OF WOUND VAC  11/11/2021   Procedure: APPLICATION OF WOUND VAC;  Surgeon: [name], MD;  Location: [facility] OR;  Service: Orthopedics;;   birth control implant  07/31/2006   Essure Implant   TONSILLECTOMY     TYMPANOSTOMY TUBE PLACEMENT     WISDOM TOOTH EXTRACTION     Social History   Occupational History   Not on file  Tobacco Use   Smoking status: Every Day    Current packs/day: 0.25    Average packs/day: 0.3 packs/day for 25.0 years (6.3 ttl pk-yrs)  Types: Cigarettes   Smokeless tobacco: Never   Tobacco comments:    Cut down on smoking while on Chantix-  Vaping Use   Vaping status: Never Used  Substance and Sexual Activity   Alcohol use: Not Currently    Comment: Once a year on New Year's Day   Drug use: Not Currently   Sexual activity: Yes    Birth control/protection: Implant    Comment: Essure Implant

## 2023-01-09 NOTE — Progress Notes (Signed)
Subjective:    Patient ID: Valerie Le, female    DOB: 1970-10-24, 52 y.o.   MRN: [id number]  HPI Mrs. Valerie Le is a 52 year old woman who presents for f/u right BKA.   1) Right BKA Walking and driving -received her new prosthesis and had to go back to the old one because the new one caused a pressure injury, that has now healed well -has had 2 falls since last visit Ambulating without cane No pain with walking.  No medications are helpful.  -she took hydrocodone in the past -she was taking oxycodone 5mg  but this is causing her nausea -she had weaned herself off the hydrocodone but unfortunately has been having pain from her new prosthesis and this medication is no longer providing enough relief -she is interested in trying a higher dose and frequency of the medication -she has followed up with [facility] to have her prosthesis adjusted  2) Constipation -getting a little.   3) iron level low -this is making her more constipated  4) Pressure injury: -she has developed a pressure injury.   5) Phantom limb pain: -feels pain in right limb as if she sprained her ankle  Pain Inventory Average Pain 7 Pain Right Now 8 My pain is stabbing  In the last 24 hours, has pain interfered with the following? General activity 9 Relation with others 9 Enjoyment of life 9 What TIME of day is your pain at its worst? evening Sleep (in general) Poor  Pain is worse with: bending and standing Pain improves with:  nothing Relief from Meds: 0     No family history on file. Social History   Socioeconomic History   Marital status: Married    Spouse name: Not on file   Number of children: Not on file   Years of education: Not on file   Highest education level: Not on file  Occupational History   Not on file  Tobacco Use   Smoking status: Every Day    Current packs/day: 0.25    Average packs/day: 0.3 packs/day for 25.0 years (6.3 ttl pk-yrs)    Types: Cigarettes    Smokeless tobacco: Never   Tobacco comments:    Cut down on smoking while on Chantix-  Vaping Use   Vaping status: Never Used  Substance and Sexual Activity   Alcohol use: Not Currently    Comment: Once a year on New Year's Day   Drug use: Not Currently   Sexual activity: Yes    Birth control/protection: Implant    Comment: Essure Implant  Other Topics Concern   Not on file  Social History Narrative   Not on file   Social Determinants of Health   Financial Resource Strain: Medium Risk (06/26/2022)   [facility], [facility]   Overall Financial Resource Strain (CARDIA)    Difficulty of Paying Living Expenses: Somewhat hard  Food Insecurity: Low Risk  (09/14/2022)   [facility]   Food vital sign    Within the past 12 months, you worried that your food would run out before you got money to buy more: Never true    Within the past 12 months, the food you bought just didn't last and you didn't have money to get more. : Never true  Recent Concern: Food Insecurity - Food Insecurity Present (06/26/2022)   Received from [facility], [facility]   Hunger Vital Sign    Worried About Running Out of Food  in the Last Year: Sometimes true    Ran Out of Food in the Last Year: Sometimes true  Transportation Needs: No Transportation Needs (10/16/2022)   [facility]   Transportation    In the past 12 months, has lack of reliable transportation kept you from medical appointments, meetings, work or from getting things needed for daily living? : No  Recent Concern: Transportation Needs - Unmet Transportation Needs (09/14/2022)   Received from [employer]    In the past 12 months, has lack of reliable transportation kept you from medical appointments, meetings, work or from getting things needed for daily living? : Yes  Physical Activity: Insufficiently Active (06/26/2022)   [facility],  [facility]   Exercise Vital Sign    Days of Exercise per Week: 1 day    Minutes of Exercise per Session: 10 min  Stress: Stress Concern Present (06/26/2022)   Received from [facility], [facility] of Occupational Health - Occupational Stress Questionnaire    Feeling of Stress : To some extent  Social Connections: Somewhat Isolated (06/26/2022)   Received from [facility], [facility]   Social Network    How would you rate your social network (family, work, friends)?: Restricted participation with some degree of social isolation   Past Surgical History:  Procedure Laterality Date   AMPUTATION Right 12/22/2020   Procedure: RIGHT TRANSMETATARSAL AMPUTATION;  Surgeon: [name], MD;  Location: [facility] OR;  Service: Orthopedics;  Laterality: Right;   AMPUTATION Right 06/29/2021   Procedure: REVISION RIGHT TRANSMETATARSAL AMPUTATION;  Surgeon: [name], MD;  Location: [facility] OR;  Service: Orthopedics;  Laterality: Right;   AMPUTATION Right 11/11/2021   Procedure: RIGHT BELOW KNEE AMPUTATION;  Surgeon: [name], MD;  Location: [facility] OR;  Service: Orthopedics;  Laterality: Right;   AMPUTATION TOE Right 12/04/2020   Procedure: AMPUTATION RIGHT SECOND TOE, I&D FOOT;  Surgeon: Eldred Manges, MD;  Location: [facility] ORS;  Service: Orthopedics;  Laterality: Right;  Right second toe amputation, I&D right foot.   APPLICATION OF WOUND VAC  11/11/2021   Procedure: APPLICATION OF WOUND VAC;  Surgeon: [name], MD;  Location: [facility] OR;  Service: Orthopedics;;   birth control implant  07/31/2006   Essure Implant   TONSILLECTOMY     TYMPANOSTOMY TUBE PLACEMENT     WISDOM TOOTH EXTRACTION     Past Medical History:  Diagnosis Date   Anemia    Asthma    as a child   Depression    Diabetes mellitus without complication (HCC)    Type 1 - Has Insulin Pump   Headache    migraines   High cholesterol    Hypertension    There were no vitals taken for this visit.  Opioid Risk  Score:   Fall Risk Score:  `1  Depression screen [facility] 2/9     06/13/2022    8:19 AM 05/08/2022    1:42 PM 06/22/2021    9:27 AM 02/01/2021    2:05 PM  Depression screen PHQ 2/9  Decreased Interest 0 0 0 0  Down, Depressed, Hopeless 0 2 0 0  PHQ - 2 Score 0 2 0 0  Altered sleeping  2    Tired, decreased energy  0    Change in appetite  1    Feeling bad or failure about yourself   2    Trouble concentrating  1  Moving slowly or fidgety/restless  0    Suicidal thoughts  0    PHQ-9 Score  8    Difficult doing work/chores  Somewhat difficult        Review of Systems  Musculoskeletal:  Positive for gait problem.  All other systems reviewed and are negative.     Objective:   Physical Exam Gen: no distress, normal appearing HEENT: oral mucosa pink and moist, NCAT Cardio: Reg rate Chest: normal effort, normal rate of breathing Abd: soft, non-distended Ext: no edema Psych: pleasant, normal affect Skin: intact MSK: right BKA.       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 Discussed that this is a capsaicin patch, stronger than capsaicin cream. Discussed that it is currently approved for diabetic peripheral neuropathy and post-herpetic neuralgia, but that it has also shown benefit in treating other forms of neuropathy. Provided patient with link to site to learn more about the patch: https://www.clark.biz/. Discussed that the patch would be placed in office and benefits usually last 3 months. Discussed that unintended exposure to capsaicin can cause severe irritation of eyes, mucous membranes, respiratory tract, and skin, but that Qutenza is a local treatment and does not have the systemic side effects of other nerve medications. Discussed that there may be pain, itching, erythema, and decreased sensory function associated with the application  of Qutenza. Side effects usually subside within 1 week. A cold pack of analgesic medications can help with these side effects. Blood pressure can also be increased due to pain associated with administration of the patch.   -UDS and pain contract performed previously.  -increase norco to 10mg  TID prn  -Provided with a pain relief journal and discussed that it contains foods and lifestyle tips to naturally help to improve pain. Discussed that these lifestyle strategies are also very good for health unlike some medications which can have negative side effects. Discussed that the act of keeping a journal can be therapeutic and helpful to realize patterns what helps to trigger and alleviate pain.    -Discussed current symptoms of pain and history of pain.  -Discussed benefits of exercise in reducing pain. -Discussed following foods that may reduce pain: 1) Ginger (especially studied for arthritis)- reduce leukotriene production to decrease inflammation 2) Blueberries- high in phytonutrients that decrease inflammation 3) Salmon- marine omega-3s reduce joint swelling and pain 4) Pumpkin seeds- reduce inflammation 5) dark chocolate- reduces inflammation 6) turmeric- reduces inflammation 7) tart cherries - reduce pain and stiffness 8) extra virgin olive oil - its compound olecanthal helps to block prostaglandins  9) chili peppers- can be eaten or applied topically via capsaicin 10) mint- helpful for headache, muscle aches, joint pain, and itching 11) garlic- reduces inflammation  Link to further information on diet for chronic pain: http://www.bray.com/   2) .Constipation:  -Provided list of following foods that help with constipation and highlighted a few: 1) prunes- contain high amounts of fiber.  2) apples- has a form of dietary fiber called pectin that accelerates stool movement and increases beneficial gut bacteria 3)  pears- in addition to fiber, also high in fructose and sorbitol which have laxative effect 4) figs- contain an enzyme ficin which helps to speed colonic transit 5) kiwis- contain an enzyme actinidin that improves gut motility and reduces constipation 6) oranges- rich in pectin (like apples) 7) grapefruits- contain a flavanol naringenin which has  a laxative effect 8) vegetables- rich in fiber and also great sources of folate, vitamin C, and K 9) artichoke- high in inulin, prebiotic great for the microbiome 10) chicory- increases stool frequency and softness (can be added to coffee) 11) rhubarb- laxative effect 12) sweet potato- high fiber 13) beans, peas, and lentils- contain both soluble and insoluble fiber 14) chia seeds- improves intestinal health and gut flora 15) flaxseeds- laxative effect 16) whole grain rye bread- high in fiber 17) oat bran- high in soluble and insoluble fiber 18) kefir- softens stools -recommended to try at least one of these foods every day.  -drink 6-8 glasses of water per day -walk regularly, especially after meals.  -discussed eating iron rich foods to help her decrease her iron dose.     3) Pressure injury: -recommended red light therapy, collagen supplementation, application of manuka honey -discussed that this has healed  4) Fall:  -discussed recent fall and how this could be secondary to prosthesis, medications -discussed using medications as little as needed to minimize risks

## 2023-02-08 ENCOUNTER — Encounter: Payer: BC Managed Care – PPO | Attending: Physical Medicine and Rehabilitation | Admitting: Registered Nurse

## 2023-02-08 ENCOUNTER — Encounter: Payer: Self-pay | Admitting: Registered Nurse

## 2023-02-08 VITALS — BP 149/88 | HR 81 | Ht 71.0 in | Wt 267.0 lb

## 2023-02-08 DIAGNOSIS — W19XXXS Unspecified fall, sequela: Secondary | ICD-10-CM

## 2023-02-08 DIAGNOSIS — G894 Chronic pain syndrome: Secondary | ICD-10-CM | POA: Diagnosis present

## 2023-02-08 DIAGNOSIS — F17210 Nicotine dependence, cigarettes, uncomplicated: Secondary | ICD-10-CM | POA: Diagnosis not present

## 2023-02-08 DIAGNOSIS — Z5986 Financial insecurity: Secondary | ICD-10-CM | POA: Diagnosis not present

## 2023-02-08 DIAGNOSIS — Z89511 Acquired absence of right leg below knee: Secondary | ICD-10-CM | POA: Diagnosis not present

## 2023-02-08 DIAGNOSIS — Z79891 Long term (current) use of opiate analgesic: Secondary | ICD-10-CM | POA: Diagnosis not present

## 2023-02-08 DIAGNOSIS — W19XXXA Unspecified fall, initial encounter: Secondary | ICD-10-CM | POA: Diagnosis not present

## 2023-02-08 DIAGNOSIS — Z5181 Encounter for therapeutic drug level monitoring: Secondary | ICD-10-CM | POA: Diagnosis not present

## 2023-02-08 DIAGNOSIS — G546 Phantom limb syndrome with pain: Secondary | ICD-10-CM | POA: Diagnosis present

## 2023-02-08 DIAGNOSIS — Z5982 Transportation insecurity: Secondary | ICD-10-CM | POA: Diagnosis not present

## 2023-02-08 DIAGNOSIS — S88111D Complete traumatic amputation at level between knee and ankle, right lower leg, subsequent encounter: Secondary | ICD-10-CM

## 2023-02-08 NOTE — Progress Notes (Signed)
Subjective:    Patient ID: Valerie Le, female    DOB: 1971/05/09, 52 y.o.   MRN: [id number]  HPI: Valerie Le is a 52 y.o. female who returns for follow up appointment for chronic pain and medication refill. She states her pain is located in her right Patella. She rates her pain 4. Her current exercise regime is walking and performing stretching exercises.  Valerie Le reports she was walking at work on 01/09/2023 and fell forward, she denies dizziness, orl oss of balance, she was able to pick herself up, she didn't seek medical attention. Educated on [employer], she states she lost her can. Gilmer Mor was ordered today, she was encouraged to use her cane at all time. She verbalizes understanding.   Valerie Le Morphine equivalent is 30.00 MME.       Pain Inventory Average Pain 6 Pain Right Now 4 My pain is constant and aching  In the last 24 hours, has pain interfered with the following? General activity 0 Relation with others 0 Enjoyment of life 2 What TIME of day is your pain at its worst? daytime Sleep (in general) Fair  Pain is worse with: unsure Pain improves with: medication Relief from Meds: 3  No family history on file. Social History   Socioeconomic History   Marital status: Married    Spouse name: Not on file   Number of children: Not on file   Years of education: Not on file   Highest education level: Not on file  Occupational History   Not on file  Tobacco Use   Smoking status: Every Day    Current packs/day: 0.25    Average packs/day: 0.3 packs/day for 25.0 years (6.3 ttl pk-yrs)    Types: Cigarettes   Smokeless tobacco: Never   Tobacco comments:    Cut down on smoking while on Chantix-  Vaping Use   Vaping status: Never Used  Substance and Sexual Activity   Alcohol use: Not Currently    Comment: Once a year on New Year's Day   Drug use: Not Currently   Sexual activity: Yes    Birth control/protection: Implant    Comment:  Essure Implant  Other Topics Concern   Not on file  Social History Narrative   Not on file   Social Determinants of Health   Financial Resource Strain: Medium Risk (06/26/2022)   [facility] from [facility], [facility]   Overall Financial Resource Strain (CARDIA)    Difficulty of Paying Living Expenses: Somewhat hard  Food Insecurity: Low Risk  (09/14/2022)   [facility]   Food vital sign    Within the past 12 months, you worried that your food would run out before you got money to buy more: Never true    Within the past 12 months, the food you bought just didn't last and you didn't have money to get more. : Never true  Recent Concern: Food Insecurity - Food Insecurity Present (06/26/2022)   [facility]   Hunger Vital Sign    Worried About Running Out of Food in the Last Year: Sometimes true    Ran Out of Food in the Last Year: Sometimes true  Transportation Needs: No Transportation Needs (10/16/2022)   [facility]   Transportation    In the past 12 months, has lack of reliable transportation kept you from medical appointments, meetings, work or from getting things needed for daily living? :  No  Recent Concern: Transportation Needs - Unmet Transportation Needs (09/14/2022)   Received from [facility]   Transportation    In the past 12 months, has lack of reliable transportation kept you from medical appointments, meetings, work or from getting things needed for daily living? : Yes  Physical Activity: Insufficiently Active (06/26/2022)   Received from [facility], [facility]   Exercise Vital Sign    Days of Exercise per Week: 1 day    Minutes of Exercise per Session: 10 min  Stress: Stress Concern Present (06/26/2022)   Received from [facility], [facility] of Occupational Health - Occupational Stress Questionnaire    Feeling of Stress : To some extent  Social  Connections: Somewhat Isolated (06/26/2022)   Received from [facility], [facility]   Social Network    How would you rate your social network (family, work, friends)?: Restricted participation with some degree of social isolation   Past Surgical History:  Procedure Laterality Date   AMPUTATION Right 12/22/2020   Procedure: RIGHT TRANSMETATARSAL AMPUTATION;  Surgeon: [name], MD;  Location: [facility] OR;  Service: Orthopedics;  Laterality: Right;   AMPUTATION Right 06/29/2021   Procedure: REVISION RIGHT TRANSMETATARSAL AMPUTATION;  Surgeon: [name], MD;  Location: [facility] OR;  Service: Orthopedics;  Laterality: Right;   AMPUTATION Right 11/11/2021   Procedure: RIGHT BELOW KNEE AMPUTATION;  Surgeon: [name], MD;  Location: [facility] OR;  Service: Orthopedics;  Laterality: Right;   AMPUTATION TOE Right 12/04/2020   Procedure: AMPUTATION RIGHT SECOND TOE, I&D FOOT;  Surgeon: Eldred Manges, MD;  Location: [facility] ORS;  Service: Orthopedics;  Laterality: Right;  Right second toe amputation, I&D right foot.   APPLICATION OF WOUND VAC  11/11/2021   Procedure: APPLICATION OF WOUND VAC;  Surgeon: [name], MD;  Location: [facility] OR;  Service: Orthopedics;;   birth control implant  07/31/2006   Essure Implant   TONSILLECTOMY     TYMPANOSTOMY TUBE PLACEMENT     WISDOM TOOTH EXTRACTION     Past Surgical History:  Procedure Laterality Date   AMPUTATION Right 12/22/2020   Procedure: RIGHT TRANSMETATARSAL AMPUTATION;  Surgeon: [name], MD;  [facility] OR;  Service: Orthopedics;  Laterality: Right;   AMPUTATION Right 06/29/2021   Procedure: REVISION RIGHT TRANSMETATARSAL AMPUTATION;  Surgeon: [name], MD;  Location: [facility] OR;  Service: Orthopedics;  Laterality: Right;   AMPUTATION Right 11/11/2021   Procedure: RIGHT BELOW KNEE AMPUTATION;  Surgeon: [name], MD;  Location: [facility] OR;  Service: Orthopedics;  Laterality: Right;   AMPUTATION TOE Right 12/04/2020   Procedure: AMPUTATION RIGHT  SECOND TOE, I&D FOOT;  Surgeon: Eldred Manges, MD;  Location: [facility];  Service: Orthopedics;  Laterality: Right;  Right second toe amputation, I&D right foot.   APPLICATION OF WOUND VAC  11/11/2021   Procedure: APPLICATION OF WOUND VAC;  Surgeon: [name], MD;  Location: [facility] OR;  Service: Orthopedics;;   birth control implant  07/31/2006   Essure Implant   TONSILLECTOMY     TYMPANOSTOMY TUBE PLACEMENT     WISDOM TOOTH EXTRACTION     Past Medical History:  Diagnosis Date   Anemia    Asthma    as a child   Depression    Diabetes mellitus without complication (HCC)    Type 1 - Has Insulin Pump   Headache    migraines   High cholesterol    Hypertension  BP (!) 160/85   Pulse 81   Ht 5\' 11"  (1.803 m)   Wt 267 lb (121.1 kg)   SpO2 99%   BMI 37.24 kg/m   Opioid Risk Score:   Fall Risk Score:  `1  Depression screen [facility] 2/9     01/09/2023   10:43 AM 06/13/2022    8:19 AM 05/08/2022    1:42 PM 06/22/2021    9:27 AM 02/01/2021    2:05 PM  Depression screen PHQ 2/9  Decreased Interest 1 0 0 0 0  Down, Depressed, Hopeless 1 0 2 0 0  PHQ - 2 Score 2 0 2 0 0  Altered sleeping   2    Tired, decreased energy   0    Change in appetite   1    Feeling bad or failure about yourself    2    Trouble concentrating   1    Moving slowly or fidgety/restless   0    Suicidal thoughts   0    PHQ-9 Score   8    Difficult doing work/chores   Somewhat difficult       Review of Systems  Gastrointestinal:  Positive for diarrhea.  Musculoskeletal:        Stump pain  All other systems reviewed and are negative.     Objective:   Physical Exam Vitals and nursing note reviewed.  Constitutional:      Appearance: Normal appearance. She is obese.  Cardiovascular:     Rate and Rhythm: Normal rate and regular rhythm.     Pulses: Normal pulses.     Heart sounds: Normal heart sounds.  Pulmonary:     Effort: Pulmonary effort is normal.     Breath sounds: Normal breath sounds.   Musculoskeletal:     Cervical back: Normal range of motion and neck supple.     Comments: Normal Muscle Bulk and Muscle Testing Reveals:  Upper Extremities: Full ROM and Muscle Strength 5/5  Lower Extremities: Right : BKA wearing Prosthesis Left Lower Extremity: Full ROM and Muscle Strength 5/5 Arises from chair with ease Narrow Based  Gait     Skin:    General: Skin is warm and dry.  Neurological:     Mental Status: She is alert and oriented to person, place, and time.  Psychiatric:        Mood and Affect: Mood normal.        Behavior: Behavior normal.         Assessment & Plan:   Assessment & Plan:  Right BKA: Continue HEP as Tolerated. Continue to Monitor.  Chronic Pain Syndrome: Continue Hydrocodone 10/325 mg one tablet three times  a day as needed for pain #90. We will continue the opioid monitoring program, this consists of regular clinic visits, examinations, urine drug screen, pill counts as well as use of [place] Controlled Substance Reporting system. A 12 month History has been reviewed on the [place] Controlled Substance Reporting System on 02/08/2023 Fall: Educated on [place] Prevention: She verbalizes understanding. RX: Cane.   F/U in 1 month

## 2023-03-15 ENCOUNTER — Telehealth: Payer: Self-pay | Admitting: Orthopedic Surgery

## 2023-03-19 ENCOUNTER — Encounter
Payer: BC Managed Care – PPO | Attending: Physical Medicine and Rehabilitation | Admitting: Physical Medicine and Rehabilitation

## 2023-03-19 DIAGNOSIS — G546 Phantom limb syndrome with pain: Secondary | ICD-10-CM | POA: Insufficient documentation

## 2023-03-19 MED ORDER — TOPIRAMATE 25 MG PO TABS: 25.0000 mg | ORAL_TABLET | Freq: Every evening | ORAL | 3 refills | Status: DC

## 2023-03-19 NOTE — Progress Notes (Signed)
Subjective:    Patient ID: Valerie Le, female    DOB: 1970/11/18, 52 y.o.   MRN: [id number]  HPI Valerie Le is a 52 year old woman who presents for f/u right BKA.   1) Right BKA Walking and driving -pain has been severe and gabapentin is no longer helpful.  -received her new prosthesis and had to go back to the old one because the new one caused a pressure injury, that has now healed well -has had 2 falls since last visit Ambulating without cane No pain with walking.  No medications are helpful.  -she took hydrocodone in the past -she was taking oxycodone 5mg  but this is causing her nausea -she had weaned herself off the hydrocodone but unfortunately has been having pain from her new prosthesis and this medication is no longer providing enough relief -she is interested in trying a higher dose and frequency of the medication -she has followed up with [facility] to have her prosthesis adjusted  2) Constipation -getting a little.   3) iron level low -this is making her more constipated  4) Pressure injury: -she has developed a pressure injury.   5) Phantom limb pain: -feels pain in right limb as if she sprained her ankle  Pain Inventory Average Pain 7 Pain Right Now 8 My pain is stabbing  In the last 24 hours, has pain interfered with the following? General activity 9 Relation with others 9 Enjoyment of life 9 What TIME of day is your pain at its worst? evening Sleep (in general) Poor  Pain is worse with: bending and standing Pain improves with:  nothing Relief from Meds: 0     No family history on file. Social History   Socioeconomic History   Marital status: Married    Spouse name: Not on file   Number of children: Not on file   Years of education: Not on file   Highest education level: Not on file  Occupational History   Not on file  Tobacco Use   Smoking status: Every Day    Current packs/day: 0.25    Average packs/day: 0.3  packs/day for 25.0 years (6.3 ttl pk-yrs)    Types: Cigarettes   Smokeless tobacco: Never   Tobacco comments:    Cut down on smoking while on Chantix-  Vaping Use   Vaping status: Never Used  Substance and Sexual Activity   Alcohol use: Not Currently    Comment: Once a year on New Year's Day   Drug use: Not Currently   Sexual activity: Yes    Birth control/protection: Implant    Comment: Essure Implant  Other Topics Concern   Not on file  Social History Narrative   Not on file   Social Determinants of Health   Financial Resource Strain: Medium Risk (06/26/2022)   [facility]   Overall Financial Resource Strain (CARDIA)    Difficulty of Paying Living Expenses: Somewhat hard  Food Insecurity: Low Risk  (09/14/2022)   [facility]   Hunger Vital Sign    Worried About Running Out of Food in the Last Year: Never true    Ran Out of Food in the Last Year: Never true  Recent Concern: Food Insecurity - Food Insecurity Present (06/26/2022)   [facility]   Hunger Vital Sign    Worried About Running Out of Food in the Last Year: Sometimes true    Ran Out of  Food in the Last Year: Sometimes true  Transportation Needs: No Transportation Needs (10/16/2022)   [facility]   Transportation    In the past 12 months, has lack of reliable transportation kept you from medical appointments, meetings, work or from getting things needed for daily living? : No  Recent Concern: Transportation Needs - Unmet Transportation Needs (09/14/2022)   Received from [facility]    In the past 12 months, has lack of reliable transportation kept you from medical appointments, meetings, work or from getting things needed for daily living? : Yes  Physical Activity: Insufficiently Active (06/26/2022)   [facility], [facility]   Exercise Vital Sign    Days of  Exercise per Week: 1 day    Minutes of Exercise per Session: 10 min  Stress: Stress Concern Present (06/26/2022)   Received from [facility] - Occupational Stress Questionnaire    Feeling of Stress : To some extent  Social Connections: Somewhat Isolated (06/26/2022)   Received [facility]   Social Network    How would you rate your social network (family, work, friends)?: Restricted participation with some degree of social isolation   Past Surgical History:  Procedure Laterality Date   AMPUTATION Right 12/22/2020   Procedure: RIGHT TRANSMETATARSAL AMPUTATION;  Surgeon: [name], MD;  Location[facility];  Service: Orthopedics;  Laterality: Right;   AMPUTATION Right 06/29/2021   Procedure: REVISION RIGHT TRANSMETATARSAL AMPUTATION;  Surgeon: [name], MD;  Location: [facility] OR;  Service: Orthopedics;  Laterality: Right;   AMPUTATION Right 11/11/2021   Procedure: RIGHT BELOW KNEE AMPUTATION;  Surgeon: [name], MD;  Location: [facility] OR;  Service: Orthopedics;  Laterality: Right;   AMPUTATION TOE Right 12/04/2020   Procedure: AMPUTATION RIGHT SECOND TOE, I&D FOOT;  Surgeon: Eldred Manges, MD;  Location: [facility] ORS;  Service: Orthopedics;  Laterality: Right;  Right second toe amputation, I&D right foot.   APPLICATION OF WOUND VAC  11/11/2021   Procedure: APPLICATION OF WOUND VAC;  Surgeon: [name], MD;  Location: [facility] OR;  Service: Orthopedics;;   birth control implant  07/31/2006   Essure Implant   TONSILLECTOMY     TYMPANOSTOMY TUBE PLACEMENT     WISDOM TOOTH EXTRACTION     Past Medical History:  Diagnosis Date   Anemia    Asthma    as a child   Depression    Diabetes mellitus without complication (HCC)    Type 1 - Has Insulin Pump   Headache    migraines   High cholesterol    Hypertension    There were no vitals taken for this visit.  Opioid Risk Score:   Fall Risk Score:  `1  Depression  screen [facility] 2/9     01/09/2023   10:43 AM 06/13/2022    8:19 AM 05/08/2022    1:42 PM 06/22/2021    9:27 AM 02/01/2021    2:05 PM  Depression screen PHQ 2/9  Decreased Interest 1 0 0 0 0  Down, Depressed, Hopeless 1 0 2 0 0  PHQ - 2 Score 2 0 2 0 0  Altered sleeping   2    Tired, decreased energy   0    Change in appetite   1    Feeling bad or failure about yourself    2    Trouble concentrating  1    Moving slowly or fidgety/restless   0    Suicidal thoughts   0    PHQ-9 Score   8    Difficult doing work/chores   Somewhat difficult        Review of Systems  Musculoskeletal:  Positive for gait problem.  All other systems reviewed and are negative.      Objective:   Physical Exam Gen: no distress, normal appearing HEENT: oral mucosa pink and moist, NCAT Cardio: Reg rate Chest: normal effort, normal rate of breathing Abd: soft, non-distended Ext: no edema Psych: pleasant, normal affect Skin: intact MSK: right BKA.       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 Discussed that this is a capsaicin patch, stronger than capsaicin cream. Discussed that it is currently approved for diabetic peripheral neuropathy and post-herpetic neuralgia, but that it has also shown benefit in treating other forms of neuropathy. Provided patient with link to site to learn more about the patch: https://www.clark.biz/. Discussed that the patch would be placed in office and benefits usually last 3 months. Discussed that unintended exposure to capsaicin can cause severe irritation of eyes, mucous membranes, respiratory tract, and skin, but that Qutenza is a local treatment and does not have the systemic side effects of other nerve medications. Discussed that there may be pain, itching, erythema, and decreased sensory function associated with the application of  Qutenza. Side effects usually subside within 1 week. A cold pack of analgesic medications can help with these side effects. Blood pressure can also be increased due to pain associated with administration of the patch.   -UDS and pain contract performed previously.  -continue gabapentin 300mg  TID -start topamax 25mg  HS -increase norco to 10mg  TID prn  -Provided with a pain relief journal and discussed that it contains foods and lifestyle tips to naturally help to improve pain. Discussed that these lifestyle strategies are also very good for health unlike some medications which can have negative side effects. Discussed that the act of keeping a journal can be therapeutic and helpful to realize patterns what helps to trigger and alleviate pain.    -Discussed current symptoms of pain and history of pain.  -Discussed benefits of exercise in reducing pain. -Discussed following foods that may reduce pain: 1) Ginger (especially studied for arthritis)- reduce leukotriene production to decrease inflammation 2) Blueberries- high in phytonutrients that decrease inflammation 3) Salmon- marine omega-3s reduce joint swelling and pain 4) Pumpkin seeds- reduce inflammation 5) dark chocolate- reduces inflammation 6) turmeric- reduces inflammation 7) tart cherries - reduce pain and stiffness 8) extra virgin olive oil - its compound olecanthal helps to block prostaglandins  9) chili peppers- can be eaten or applied topically via capsaicin 10) mint- helpful for headache, muscle aches, joint pain, and itching 11) garlic- reduces inflammation  Link to further information on diet for chronic pain: http://www.bray.com/   2) .Constipation:  -Provided list of following foods that help with constipation and highlighted a few: 1) prunes- contain high amounts of fiber.  2) apples- has a form of dietary fiber called pectin that accelerates stool  movement and increases beneficial gut bacteria 3) pears- in addition to fiber, also high in fructose and sorbitol which have laxative effect 4) figs- contain an enzyme ficin which helps to speed colonic transit 5) kiwis- contain an enzyme actinidin that improves gut motility and  reduces constipation 6) oranges- rich in pectin (like apples) 7) grapefruits- contain a flavanol naringenin which has a laxative effect 8) vegetables- rich in fiber and also great sources of folate, vitamin C, and K 9) artichoke- high in inulin, prebiotic great for the microbiome 10) chicory- increases stool frequency and softness (can be added to coffee) 11) rhubarb- laxative effect 12) sweet potato- high fiber 13) beans, peas, and lentils- contain both soluble and insoluble fiber 14) chia seeds- improves intestinal health and gut flora 15) flaxseeds- laxative effect 16) whole grain rye bread- high in fiber 17) oat bran- high in soluble and insoluble fiber 18) kefir- softens stools -recommended to try at least one of these foods every day.  -drink 6-8 glasses of water per day -walk regularly, especially after meals.  -discussed eating iron rich foods to help her decrease her iron dose.     3) Pressure injury: -recommended red light therapy, collagen supplementation, application of manuka honey -discussed that this has healed  4) Fall:  -discussed recent fall and how this could be secondary to prosthesis, medications -discussed using medications as little as needed to minimize risks  5 minutes spent in discussion of her chronic pain, trial of topamax at night, discussed that topamax can decrease appetite and help with insomnia, discussed that she is still on relatively low dose of gabapentin

## 2023-03-21 ENCOUNTER — Ambulatory Visit (INDEPENDENT_AMBULATORY_CARE_PROVIDER_SITE_OTHER): Payer: BC Managed Care – PPO | Admitting: Family

## 2023-03-21 DIAGNOSIS — M7989 Other specified soft tissue disorders: Secondary | ICD-10-CM | POA: Diagnosis not present

## 2023-03-21 DIAGNOSIS — Z89512 Acquired absence of left leg below knee: Secondary | ICD-10-CM | POA: Diagnosis not present

## 2023-03-21 DIAGNOSIS — S88111D Complete traumatic amputation at level between knee and ankle, right lower leg, subsequent encounter: Secondary | ICD-10-CM

## 2023-03-23 ENCOUNTER — Encounter: Payer: Self-pay | Admitting: Family

## 2023-03-23 NOTE — Progress Notes (Signed)
Office Visit Note   Patient: Valerie Le           Date of Birth: [birth year]           MRN: [id number] Visit Date: 03/21/2023              Requested by: Maud Deed, [place] [address] [place],  [place] [address] PCP: Maud Deed, [place]  Chief Complaint  Patient presents with   Right Leg - Follow-up    Hx BKA      HPI: The patient is a 52 year old woman who presents today for evaluation of her right residual limb she reports she has had a meniscal injury on the right she is following with a different provider for this injury.  She has had difficulty with swelling of the knee which has subsequently caused swelling in her residual limb  Today she presents for evaluation of some dry peeling skin swelling to her residual limb she is concerned that she may develop further skin issues to her residual limb no erythema warmth or skin breakdown  Assessment & Plan: Visit Diagnoses:  1. Below-knee amputation of right lower extremity, subsequent encounter (HCC)   2. Swelling of limb, right     Plan: She may advance her weightbearing in her prosthesis as tolerated.  Reassurance provided.  Follow-Up Instructions: Return if symptoms worsen or fail to improve.   Ortho Exam  Patient is alert, oriented, no adenopathy, well-dressed, normal affect, normal respiratory effort. On examination right residual limb her incisions well-healed limb is well consolidated she does have some dry peeling skin and trace edema no sign of infection  Imaging: No results found. No images are attached to the encounter.  Labs: Lab Results  Component Value Date   HGBA1C 7.4 (H) 11/11/2021   HGBA1C 13.1 (H) 12/02/2020   ESRSEDRATE 84 (H) 11/02/2021   ESRSEDRATE 72 (H) 06/15/2021   CRP 24.7 (H) 06/15/2021   REPTSTATUS 11/07/2021 FINAL 11/02/2021   GRAMSTAIN  06/29/2021    RARE WBC PRESENT, PREDOMINANTLY MONONUCLEAR NO ORGANISMS SEEN    CULT  11/02/2021    NO GROWTH  5 DAYS Performed at [facility], [address], [place], [place] [address]    [facility] STAPHYLOCOCCUS EPIDERMIDIS 06/29/2021   LABORGA STAPHYLOCOCCUS LUGDUNENSIS 06/29/2021     Lab Results  Component Value Date   ALBUMIN 1.9 (L) 11/17/2021   ALBUMIN 2.1 (L) 11/03/2021   ALBUMIN 2.6 (L) 12/03/2020   PREALBUMIN 11.4 (L) 11/11/2021    Lab Results  Component Value Date   MG 1.9 11/11/2021   Lab Results  Component Value Date   VD25OH 33.19 11/17/2021   VD25OH 32.37 11/11/2021    Lab Results  Component Value Date   PREALBUMIN 11.4 (L) 11/11/2021      Latest Ref Rng & Units 11/21/2021    6:07 AM 11/17/2021    5:20 AM 11/13/2021    3:13 AM  CBC EXTENDED  WBC 4.0 - 10.5 K/uL 12.8  14.7  17.7   RBC 3.87 - 5.11 MIL/uL 3.38  3.25  3.59   Hemoglobin 12.0 - 15.0 g/dL 7.7  7.2  8.1   HCT 62.1 - 46.0 % 23.4  22.7  25.1   Platelets 150 - 400 K/uL 363  331  437   NEUT# 1.7 - 7.7 K/uL  9.3    Lymph# 0.7 - 4.0 K/uL  3.2       There is no height or weight on file to  calculate BMI.  Orders:  No orders of the defined types were placed in this encounter.  No orders of the defined types were placed in this encounter.    Procedures: No procedures performed  Clinical Data: No additional findings.  ROS:  All other systems negative, except as noted in the HPI. Review of Systems  Objective: Vital Signs: There were no vitals taken for this visit.  Specialty Comments:  No specialty comments available.  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 of skin (HCC)    Chest pain 11/02/2021   Elevated troponin 11/02/2021   SIRS (systemic inflammatory response syndrome) (HCC) 11/02/2021   Diabetes mellitus type 1 (HCC)  11/02/2021   Thrombocytosis 11/02/2021   Microcytic hypochromic anemia 11/02/2021   Hypokalemia 11/02/2021   Morbid obesity (HCC) 11/02/2021   Medication monitoring encounter 06/15/2021   Smoking 02/01/2021   Acute hematogenous osteomyelitis of right foot (HCC) 12/22/2020   Osteomyelitis of foot, right, acute (HCC)    Dehiscence of amputation stump (HCC)    Diabetic foot infection (HCC) 12/02/2020   Essential hypertension 12/02/2020   Hyperlipidemia 08/21/2018   Bipolar affective disorder, currently depressed, moderate (HCC) 01/23/2017   Insulin-treated type 2 diabetes mellitus (HCC) 03/07/2013   Asthma 03/07/2013   ADHD (attention deficit hyperactivity disorder) 03/07/2013   Migraine 03/07/2013   Tobacco abuse 03/07/2013   Past Medical History:  Diagnosis Date   Anemia    Asthma    as a child   Depression    Diabetes mellitus without complication (HCC)    Type 1 - Has Insulin Pump   Headache    migraines   High cholesterol    Hypertension     History reviewed. No pertinent family history.  Past Surgical History:  Procedure Laterality Date   AMPUTATION Right 12/22/2020   Procedure: RIGHT TRANSMETATARSAL AMPUTATION;  Surgeon: [name], MD;  Location: [facility] OR;  Service: Orthopedics;  Laterality: Right;   AMPUTATION Right 06/29/2021   Procedure: REVISION RIGHT TRANSMETATARSAL AMPUTATION;  Surgeon: [name], MD;  Location: [facility] OR;  Service: Orthopedics;  Laterality: Right;   AMPUTATION Right 11/11/2021   Procedure: RIGHT BELOW KNEE AMPUTATION;  Surgeon: [name], MD;  Location: [facility] OR;  Service: Orthopedics;  Laterality: Right;   AMPUTATION TOE Right 12/04/2020   Procedure: AMPUTATION RIGHT SECOND TOE, I&D FOOT;  Surgeon: Eldred Manges, MD;  Location: [facility] ORS;  Service: Orthopedics;  Laterality: Right;  Right second toe amputation, I&D right foot.   APPLICATION OF WOUND VAC  11/11/2021   Procedure: APPLICATION OF WOUND VAC;  Surgeon: [name], MD;  Location:  [facility] OR;  Service: Orthopedics;;   birth control implant  07/31/2006   Essure Implant   TONSILLECTOMY     TYMPANOSTOMY TUBE PLACEMENT     WISDOM TOOTH EXTRACTION     Social History   Occupational History   Not on file  Tobacco Use   Smoking status: Every Day    Current packs/day: 0.25    Average packs/day: 0.3 packs/day for 25.0 years (6.3 ttl pk-yrs)    Types: Cigarettes   Smokeless tobacco: Never   Tobacco comments:    Cut down on smoking while on Chantix-  Vaping Use   Vaping status: Never  Used  Substance and Sexual Activity   Alcohol use: Not Currently    Comment: Once a year on New Year's Day   Drug use: Not Currently   Sexual activity: Yes    Birth control/protection: Implant    Comment: Essure Implant

## 2023-03-26 NOTE — Progress Notes (Deleted)
Subjective:    Patient ID: Valerie Le, female    DOB: [birth year], 52 y.o.   MRN: [id number]  HPI   Pain Inventory Average Pain {NUMBERS; 0-10:5044} Pain Right Now {NUMBERS; 0-10:5044} My pain is {PAIN DESCRIPTION:21022940}  In the last 24 hours, has pain interfered with the following? General activity {NUMBERS; 0-10:5044} Relation with others {NUMBERS; 0-10:5044} Enjoyment of life {NUMBERS; 0-10:5044} What TIME of day is your pain at its worst? {time of day:24191} Sleep (in general) {BHH GOOD/FAIR/POOR:22877}  Pain is worse with: {ACTIVITIES:21022942} Pain improves with: {PAIN IMPROVES POEU:23536144} Relief from Meds: {NUMBERS; 0-10:5044}  No family history on file. Social History   Socioeconomic History   Marital status: Married    Spouse name: Not on file   Number of children: Not on file   Years of education: Not on file   Highest education level: Not on file  Occupational History   Not on file  Tobacco Use   Smoking status: Every Day    Current packs/day: 0.25    Average packs/day: 0.3 packs/day for 25.0 years (6.3 ttl pk-yrs)    Types: Cigarettes   Smokeless tobacco: Never   Tobacco comments:    Cut down on smoking while on Chantix-  Vaping Use   Vaping status: Never Used  Substance and Sexual Activity   Alcohol use: Not Currently    Comment: Once a year on New Year's Day   Drug use: Not Currently   Sexual activity: Yes    Birth control/protection: Implant    Comment: Essure Implant  Other Topics Concern   Not on file  Social History Narrative   Not on file   Social Determinants of Health   Financial Resource Strain: Medium Risk (06/26/2022)   Received from [facility], [facility]   Overall Financial Resource Strain (CARDIA)    Difficulty of Paying Living Expenses: Somewhat hard  Food Insecurity: Low Risk  (09/14/2022)   [facility]   Hunger Vital Sign    Worried About Running Out of Food in the Last  Year: Never true    Ran Out of Food in the Last Year: Never true  Recent Concern: Food Insecurity - Food Insecurity Present (06/26/2022)   Received [facility], [facility]   Hunger Vital Sign    Worried About Running Out of Food in the Last Year: Sometimes true    Ran Out of Food in the Last Year: Sometimes true  Transportation Needs: No Transportation Needs (10/16/2022)   [facility]   Transportation    In the past 12 months, has lack of reliable transportation kept you from medical appointments, meetings, work or from getting things needed for daily living? : No  Recent Concern: Transportation Needs - Unmet Transportation Needs (09/14/2022)   Received from [facility]    In the past 12 months, has lack of reliable transportation kept you from medical appointments, meetings, work or from getting things needed for daily living? : Yes  Physical Activity: Insufficiently Active (06/26/2022)   Received from [facility], [facility]   Exercise Vital Sign    Days of Exercise per Week: 1 day    Minutes of Exercise per Session: 10 min  Stress: Stress Concern Present (06/26/2022)   Received from [facility], [facility]   [employer] [facility] - Occupational Stress Questionnaire    Feeling of Stress : To some extent  Social Connections: Somewhat Isolated (06/26/2022)  Received [facility], [facility]   Social Network    How would you rate your social network (family, work, friends)?: Restricted participation with some degree of social isolation   Past Surgical History:  Procedure Laterality Date   AMPUTATION Right 12/22/2020   Procedure: RIGHT TRANSMETATARSAL AMPUTATION;  Surgeon: [name], MD;  Location: [facility] OR;  Service: Orthopedics;  Laterality: Right;   AMPUTATION Right 06/29/2021   Procedure: REVISION RIGHT TRANSMETATARSAL AMPUTATION;  Surgeon: [name], MD;  Location: [facility] OR;  Service:  Orthopedics;  Laterality: Right;   AMPUTATION Right 11/11/2021   Procedure: RIGHT BELOW KNEE AMPUTATION;  Surgeon: [name], MD;  Location: [facility] OR;  Service: Orthopedics;  Laterality: Right;   AMPUTATION TOE Right 12/04/2020   Procedure: AMPUTATION RIGHT SECOND TOE, I&D FOOT;  Surgeon: Eldred Manges, MD;  Location: [facility] ORS;  Service: Orthopedics;  Laterality: Right;  Right second toe amputation, I&D right foot.   APPLICATION OF WOUND VAC  11/11/2021   Procedure: APPLICATION OF WOUND VAC;  Surgeon: [name], MD;  Location: [facility] OR;  Service: Orthopedics;;   birth control implant  07/31/2006   Essure Implant   TONSILLECTOMY     TYMPANOSTOMY TUBE PLACEMENT     WISDOM TOOTH EXTRACTION     Past Surgical History:  Procedure Laterality Date   AMPUTATION Right 12/22/2020   Procedure: RIGHT TRANSMETATARSAL AMPUTATION;  Surgeon: [name], MD;  Location: [facility] OR;  Service: Orthopedics;  Laterality: Right;   AMPUTATION Right 06/29/2021   Procedure: REVISION RIGHT TRANSMETATARSAL AMPUTATION;  Surgeon: [name], MD;  Location: [facility] OR;  Service: Orthopedics;  Laterality: Right;   AMPUTATION Right 11/11/2021   Procedure: RIGHT BELOW KNEE AMPUTATION;  Surgeon: [name], MD;  Location: [facility] OR;  Service: Orthopedics;  Laterality: Right;   AMPUTATION TOE Right 12/04/2020   Procedure: AMPUTATION RIGHT SECOND TOE, I&D FOOT;  Surgeon: Eldred Manges, MD;  Location: [facility]S;  Service: Orthopedics;  Laterality: Right;  Right second toe amputation, I&D right foot.   APPLICATION OF WOUND VAC  11/11/2021   Procedure: APPLICATION OF WOUND VAC;  Surgeon: [name], MD;  [facility];  Service: Orthopedics;;   birth control implant  07/31/2006   Essure Implant   TONSILLECTOMY     TYMPANOSTOMY TUBE PLACEMENT     WISDOM TOOTH EXTRACTION     Past Medical History:  Diagnosis Date   Anemia    Asthma    as a child   Depression    Diabetes mellitus without complication (HCC)    Type 1 - Has  Insulin Pump   Headache    migraines   High cholesterol    Hypertension    There were no vitals taken for this visit.  Opioid Risk Score:   Fall Risk Score:  `1  Depression screen [facility] 2/9     01/09/2023   10:43 AM 06/13/2022    8:19 AM 05/08/2022    1:42 PM 06/22/2021    9:27 AM 02/01/2021    2:05 PM  Depression screen PHQ 2/9  Decreased Interest 1 0 0 0 0  Down, Depressed, Hopeless 1 0 2 0 0  PHQ - 2 Score 2 0 2 0 0  Altered sleeping   2    Tired, decreased energy   0    Change in appetite   1    Feeling bad or failure about yourself    2    Trouble  concentrating   1    Moving slowly or fidgety/restless   0    Suicidal thoughts   0    PHQ-9 Score   8    Difficult doing work/chores   Somewhat difficult      Review of Systems     Objective:   Physical Exam        Assessment & Plan:

## 2023-03-27 ENCOUNTER — Encounter: Payer: BC Managed Care – PPO | Attending: Physical Medicine and Rehabilitation | Admitting: Registered Nurse

## 2023-03-27 DIAGNOSIS — Z5181 Encounter for therapeutic drug level monitoring: Secondary | ICD-10-CM | POA: Insufficient documentation

## 2023-03-27 DIAGNOSIS — G894 Chronic pain syndrome: Secondary | ICD-10-CM | POA: Insufficient documentation

## 2023-03-27 DIAGNOSIS — Z79891 Long term (current) use of opiate analgesic: Secondary | ICD-10-CM | POA: Insufficient documentation

## 2023-03-28 NOTE — Telephone Encounter (Signed)
Error

## 2023-04-24 ENCOUNTER — Encounter: Payer: BC Managed Care – PPO | Admitting: Physical Medicine and Rehabilitation

## 2023-06-28 ENCOUNTER — Encounter
Payer: BC Managed Care – PPO | Attending: Physical Medicine and Rehabilitation | Admitting: Physical Medicine and Rehabilitation

## 2023-06-28 VITALS — BP 130/84 | HR 94 | Ht 71.0 in | Wt 268.0 lb

## 2023-06-28 DIAGNOSIS — E1143 Type 2 diabetes mellitus with diabetic autonomic (poly)neuropathy: Secondary | ICD-10-CM | POA: Diagnosis present

## 2023-06-28 DIAGNOSIS — G546 Phantom limb syndrome with pain: Secondary | ICD-10-CM | POA: Diagnosis not present

## 2023-06-28 DIAGNOSIS — Z794 Long term (current) use of insulin: Secondary | ICD-10-CM

## 2023-06-28 MED ORDER — CAPSAICIN-CLEANSING GEL 8 % EX KIT
1.0000 | PACK | Freq: Once | CUTANEOUS | Status: AC
  Administered 2023-06-28: 1 via TOPICAL

## 2023-06-28 MED ORDER — CAPSAICIN-CLEANSING GEL 8 % EX KIT
2.0000 | PACK | Freq: Once | CUTANEOUS | Status: AC
  Administered 2023-06-28: 2 via TOPICAL

## 2023-06-28 MED ORDER — HYDROCODONE-ACETAMINOPHEN 10-325 MG PO TABS: 1.0000 | ORAL_TABLET | Freq: Two times a day (BID) | ORAL | 0 refills | Status: DC | PRN

## 2023-06-28 NOTE — Addendum Note (Signed)
 Addended by: Horton Chin on: 06/28/2023 10:18 AM   Modules accepted: Orders

## 2023-06-28 NOTE — Progress Notes (Signed)
-  Discussed Qutenza  as an option for neuropathic pain control. Discussed that this is a capsaicin  patch, stronger than capsaicin  cream. Discussed that it is currently approved for diabetic peripheral neuropathy and post-herpetic neuralgia, but that it has also shown benefit in treating other forms of neuropathy. Provided patient with link to site to learn more about the patch: https://www.qutenza .com/. Discussed that the patch would be placed in office and benefits usually last 3 months. Discussed that unintended exposure to capsaicin  can cause severe irritation of eyes, mucous membranes, respiratory tract, and skin, but that Qutenza  is a local treatment and does not have the systemic side effects of other nerve medications. Discussed that there may be pain, itching, erythema, and decreased sensory function associated with the application of Qutenza . Side effects usually subside within 1 week. A cold pack of analgesic medications can help with these side effects. Blood pressure can also be increased due to pain associated with administration of the patch.   3 patches of Qutenza  (40981) was applied to right residual limb and left foot. Ice packs were applied during the procedure to ensure patient comfort. Blood pressure was monitored every 15 minutes. The patient tolerated the procedure well. Post-procedure instructions were given and follow-up has been scheduled.  Topical system measures 14cm x20cm (280cm for a total 1120units) were applied which will cause deeper penetration for destruction of the peripheral nerve using a chemical (Qutenza ) which infuses into the skin like an injection and heat technique (occlusive, compressive dressing cauing endothermic heat technique)

## 2023-07-24 ENCOUNTER — Encounter: Payer: Self-pay | Admitting: Registered Nurse

## 2023-07-24 ENCOUNTER — Encounter: Payer: BC Managed Care – PPO | Attending: Physical Medicine and Rehabilitation | Admitting: Registered Nurse

## 2023-07-24 VITALS — BP 105/72 | HR 95 | Ht 71.0 in | Wt 264.0 lb

## 2023-07-24 DIAGNOSIS — G546 Phantom limb syndrome with pain: Secondary | ICD-10-CM | POA: Diagnosis not present

## 2023-07-24 DIAGNOSIS — S88111D Complete traumatic amputation at level between knee and ankle, right lower leg, subsequent encounter: Secondary | ICD-10-CM | POA: Insufficient documentation

## 2023-07-24 DIAGNOSIS — Z79891 Long term (current) use of opiate analgesic: Secondary | ICD-10-CM | POA: Diagnosis not present

## 2023-07-24 DIAGNOSIS — Z5181 Encounter for therapeutic drug level monitoring: Secondary | ICD-10-CM | POA: Diagnosis not present

## 2023-07-24 DIAGNOSIS — G894 Chronic pain syndrome: Secondary | ICD-10-CM | POA: Insufficient documentation

## 2023-07-24 MED ORDER — HYDROCODONE-ACETAMINOPHEN 10-325 MG PO TABS: 1.0000 | ORAL_TABLET | Freq: Two times a day (BID) | ORAL | 0 refills | Status: DC | PRN

## 2023-07-24 NOTE — Progress Notes (Signed)
 Subjective:    Patient ID: Valerie Le, female    DOB: 01/08/1971, 53 y.o.   MRN: [id number]  HPI: Valerie Le is a 53 y.o. female who returns for follow up appointment for chronic pain and medication refill. She states her pain is located in her right stump ( Phantom Pain). She rates her pain 6. Her current exercise regime is walking and performing stretching exercises.  [name] Morphine  equivalent is 20.00 MME.   UDS ordered today.     Pain Inventory Average Pain 7 Pain Right Now 6 My pain is intermittent, constant, stabbing, and tingling  In the last 24 hours, has pain interfered with the following? General activity 4 Relation with others 4 Enjoyment of life 6 What TIME of day is your pain at its worst? varies Sleep (in general) Poor  Pain is worse with: walking, bending, sitting, inactivity, standing, and some activites Pain improves with: rest, heat/ice, medication, and elevation Relief from Meds: 10  No family history on file. Social History   Socioeconomic History   Marital status: Married    Spouse name: Not on file   Number of children: Not on file   Years of education: Not on file   Highest education level: Not on file  Occupational History   Not on file  Tobacco Use   Smoking status: Every Day    Current packs/day: 0.25    Average packs/day: 0.3 packs/day for 25.0 years (6.3 ttl pk-yrs)    Types: Cigarettes   Smokeless tobacco: Never   Tobacco comments:    Cut down on smoking while on Chantix -  Vaping Use   Vaping status: Never Used  Substance and Sexual Activity   Alcohol use: Not Currently    Comment: Once a year on New Year's Day   Drug use: Not Currently   Sexual activity: Yes    Birth control/protection: Implant    Comment: Essure Implant  Other Topics Concern   Not on file  Social History Narrative   Not on file   Social Drivers of Health   Financial Resource Strain: Low Risk  (05/20/2023)   Received from  [facility]   Overall Financial Resource Strain (CARDIA)    Difficulty of Paying Living Expenses: Not very hard  Food Insecurity: Food Insecurity Present (05/20/2023)   Received from [facility]   Hunger Vital Sign    Worried About Running Out of Food in the Last Year: Sometimes true    Ran Out of Food in the Last Year: Sometimes true  Transportation Needs: No Transportation Needs (05/20/2023)   Received from [facility] - [facility]    Lack of Transportation (Medical): No    Lack of Transportation (Non-Medical): No  Physical Activity: Insufficiently Active (05/20/2023)   Received from [facility]   Exercise Vital Sign    Days of Exercise per Week: 2 days    Minutes of Exercise per Session: 60 min  Stress: Stress Concern Present (05/20/2023)   Received from [facility] - Occupational Stress Questionnaire    Feeling of Stress : Very much  Social Connections: Somewhat Isolated (05/20/2023)   Received from [facility]   Social Network    How would you rate your social network (family, work, friends)?: Restricted participation with some degree of social isolation   Past Surgical History:  Procedure Laterality Date   AMPUTATION Right 12/22/2020   Procedure: RIGHT TRANSMETATARSAL AMPUTATION;  Surgeon: Harden Lame  V, MD;  [facility];  Service: Orthopedics;  Laterality: Right;   AMPUTATION Right 06/29/2021   Procedure: REVISION RIGHT TRANSMETATARSAL AMPUTATION;  Surgeon: Harden Jerona GAILS, MD;  Location: [facility] OR;  Service: Orthopedics;  Laterality: Right;   AMPUTATION Right 11/11/2021   Procedure: RIGHT BELOW KNEE AMPUTATION;  Surgeon: Harden Jerona GAILS, MD;  Location: [facility] OR;  Service: Orthopedics;  Laterality: Right;   AMPUTATION TOE Right 12/04/2020   Procedure: AMPUTATION RIGHT SECOND TOE, I&D FOOT;  Surgeon: Barbarann Oneil BROCKS, MD;  Location: [facility];  Service: Orthopedics;  Laterality: Right;  Right second toe amputation, I&D  right foot.   APPLICATION OF WOUND VAC  11/11/2021   Procedure: APPLICATION OF WOUND VAC;  Surgeon: Harden Jerona GAILS, MD;  [facility];  Service: Orthopedics;;   birth control implant  07/31/2006   Essure Implant   TONSILLECTOMY     TYMPANOSTOMY TUBE PLACEMENT     WISDOM TOOTH EXTRACTION     Past Surgical History:  Procedure Laterality Date   AMPUTATION Right 12/22/2020   Procedure: RIGHT TRANSMETATARSAL AMPUTATION;  Surgeon: Harden Jerona GAILS, MD;  Location: [facility] OR;  Service: Orthopedics;  Laterality: Right;   AMPUTATION Right 06/29/2021   Procedure: REVISION RIGHT TRANSMETATARSAL AMPUTATION;  Surgeon: Harden Jerona GAILS, MD;  Location: [facility] OR;  Service: Orthopedics;  Laterality: Right;   AMPUTATION Right 11/11/2021   Procedure: RIGHT BELOW KNEE AMPUTATION;  Surgeon: Harden Jerona GAILS, MD;  Location: [facility] OR;  Service: Orthopedics;  Laterality: Right;   AMPUTATION TOE Right 12/04/2020   Procedure: AMPUTATION RIGHT SECOND TOE, I&D FOOT;  Surgeon: Barbarann Oneil BROCKS, MD;  Location: [facility];  Service: Orthopedics;  Laterality: Right;  Right second toe amputation, I&D right foot.   APPLICATION OF WOUND VAC  11/11/2021   Procedure: APPLICATION OF WOUND VAC;  Surgeon: Harden Jerona GAILS, MD;  Location: [facility] OR;  Service: Orthopedics;;   birth control implant  07/31/2006   Essure Implant   TONSILLECTOMY     TYMPANOSTOMY TUBE PLACEMENT     WISDOM TOOTH EXTRACTION     Past Medical History:  Diagnosis Date   Anemia    Asthma    as a child   Depression    Diabetes mellitus without complication (HCC)    Type 1 - Has Insulin  Pump   Headache    migraines   High cholesterol    Hypertension    There were no vitals taken for this visit.  Opioid Risk Score:   Fall Risk Score:  `1  Depression screen [facility] 2/9     01/09/2023   10:43 AM 06/13/2022    8:19 AM 05/08/2022    1:42 PM 06/22/2021    9:27 AM 02/01/2021    2:05 PM  Depression screen PHQ 2/9  Decreased Interest 1 0 0 0 0  Down, Depressed, Hopeless 1 0 2 0  0  PHQ - 2 Score 2 0 2 0 0  Altered sleeping   2    Tired, decreased energy   0    Change in appetite   1    Feeling bad or failure about yourself    2    Trouble concentrating   1    Moving slowly or fidgety/restless   0    Suicidal thoughts   0    PHQ-9 Score   8    Difficult doing work/chores   Somewhat difficult       Review of Systems  Musculoskeletal:  Right leg stump (bottom)  All other systems reviewed and are negative.      Objective:   Physical Exam Vitals and nursing note reviewed.  Constitutional:      Appearance: Normal appearance.  Cardiovascular:     Rate and Rhythm: Normal rate and regular rhythm.     Pulses: Normal pulses.     Heart sounds: Normal heart sounds.  Pulmonary:     Effort: Pulmonary effort is normal.     Breath sounds: Normal breath sounds.  Musculoskeletal:     Comments: Normal Muscle Bulk and Muscle Testing Reveals:  Upper Extremities:Full  ROM and Muscle Strength 5/5  Lower Extremities : Right: BKA: Wearing Prosthesis Left Lower Extremity: Full ROM and Muscle Strength 5/5 Arises from Chair with ease Narrow Based Gait     Skin:    General: Skin is warm and dry.  Neurological:     Mental Status: She is alert and oriented to person, place, and time.  Psychiatric:        Mood and Affect: Mood normal.        Behavior: Behavior normal.         Assessment & Plan:  Right BKA: Continue HEP as Tolerated. Continue to Monitor. 07/23/2022 Chronic Pain Syndrome: Refilled: Hydrocodone  10/325 mg one tablet  twice a day   a day as needed for pain #60. We will continue the opioid monitoring program, this consists of regular clinic visits, examinations, urine drug screen, pill counts as well as use of [place]  Controlled Substance Reporting system. A 12 month History has been reviewed on the [place]  Controlled Substance Reporting System on 07/24/2023  F/U in 1 month

## 2023-07-26 LAB — TOXASSURE SELECT,+ANTIDEPR,UR

## 2023-07-27 ENCOUNTER — Encounter: Payer: BC Managed Care – PPO | Admitting: Registered Nurse

## 2023-10-08 ENCOUNTER — Encounter: Payer: BC Managed Care – PPO | Admitting: Physical Medicine and Rehabilitation

## 2023-10-19 ENCOUNTER — Other Ambulatory Visit: Payer: Self-pay

## 2023-10-19 MED ORDER — GABAPENTIN 600 MG PO TABS: 600.0000 mg | ORAL_TABLET | Freq: Every day | ORAL | 3 refills | Status: DC

## 2023-10-29 ENCOUNTER — Encounter: Attending: Physical Medicine and Rehabilitation | Admitting: Physical Medicine and Rehabilitation

## 2023-10-29 ENCOUNTER — Telehealth: Payer: Self-pay

## 2023-10-29 ENCOUNTER — Encounter: Payer: Self-pay | Admitting: Physical Medicine and Rehabilitation

## 2023-10-29 VITALS — BP 115/79 | HR 83 | Ht 71.0 in | Wt 262.0 lb

## 2023-10-29 DIAGNOSIS — Z794 Long term (current) use of insulin: Secondary | ICD-10-CM

## 2023-10-29 DIAGNOSIS — E1143 Type 2 diabetes mellitus with diabetic autonomic (poly)neuropathy: Secondary | ICD-10-CM | POA: Insufficient documentation

## 2023-10-29 MED ORDER — HYDROCODONE-ACETAMINOPHEN 10-325 MG PO TABS: 1.0000 | ORAL_TABLET | Freq: Two times a day (BID) | ORAL | 0 refills | Status: DC | PRN

## 2023-10-29 MED ORDER — CAPSAICIN-CLEANSING GEL 8 % EX KIT
1.0000 | PACK | Freq: Once | CUTANEOUS | Status: AC
  Administered 2023-10-29: 1 via TOPICAL

## 2023-10-29 MED ORDER — CAPSAICIN-CLEANSING GEL 8 % EX KIT
2.0000 | PACK | Freq: Once | CUTANEOUS | Status: AC
  Administered 2023-10-29: 2 via TOPICAL

## 2023-10-29 MED ORDER — BENZONATATE 100 MG PO CAPS: 200.0000 mg | ORAL_CAPSULE | Freq: Three times a day (TID) | ORAL | 0 refills | Status: AC | PRN

## 2023-10-29 NOTE — Progress Notes (Signed)
-  Discussed Qutenza  as an option for neuropathic pain control. Discussed that this is a capsaicin  patch, stronger than capsaicin  cream. Discussed that it is currently approved for diabetic peripheral neuropathy and post-herpetic neuralgia, but that it has also shown benefit in treating other forms of neuropathy. Provided patient with link to site to learn more about the patch: https://www.qutenza .com/. Discussed that the patch would be placed in office and benefits usually last 3 months. Discussed that unintended exposure to capsaicin  can cause severe irritation of eyes, mucous membranes, respiratory tract, and skin, but that Qutenza  is a local treatment and does not have the systemic side effects of other nerve medications. Discussed that there may be pain, itching, erythema, and decreased sensory function associated with the application of Qutenza . Side effects usually subside within 1 week. A cold pack of analgesic medications can help with these side effects. Blood pressure can also be increased due to pain associated with administration of the patch.   3 patches of Qutenza  (40981) was applied to right residual limb and left foot. Ice packs were applied during the procedure to ensure patient comfort. Blood pressure was monitored every 15 minutes. The patient tolerated the procedure well. Post-procedure instructions were given and follow-up has been scheduled.  Topical system measures 14cm x20cm (280cm for a total 1120units) were applied which will cause deeper penetration for destruction of the peripheral nerve using a chemical (Qutenza ) which infuses into the skin like an injection and heat technique (occlusive, compressive dressing cauing endothermic heat technique)

## 2023-10-29 NOTE — Telephone Encounter (Signed)
 PA submitted Hydrocodone/APAP

## 2023-11-21 NOTE — Telephone Encounter (Signed)
 Patient insurance will pay for an initially pay for a 7 day supply then the rest afterwards. Patient notified and pharmacy.

## 2023-11-26 ENCOUNTER — Encounter: Admitting: Registered Nurse

## 2023-11-26 ENCOUNTER — Other Ambulatory Visit: Payer: Self-pay | Admitting: Physical Medicine and Rehabilitation

## 2023-11-26 ENCOUNTER — Telehealth: Payer: Self-pay

## 2023-11-26 MED ORDER — GABAPENTIN 600 MG PO TABS: 600.0000 mg | ORAL_TABLET | Freq: Every day | ORAL | 3 refills | Status: AC

## 2023-11-26 NOTE — Telephone Encounter (Signed)
 Need Gabapentin  prescription transferred from [facility] to [facility] in [place]

## 2023-12-24 ENCOUNTER — Encounter: Attending: Physical Medicine and Rehabilitation | Admitting: Registered Nurse

## 2023-12-24 DIAGNOSIS — E1143 Type 2 diabetes mellitus with diabetic autonomic (poly)neuropathy: Secondary | ICD-10-CM | POA: Insufficient documentation

## 2024-01-07 ENCOUNTER — Ambulatory Visit (INDEPENDENT_AMBULATORY_CARE_PROVIDER_SITE_OTHER): Admitting: Physician Assistant

## 2024-01-07 DIAGNOSIS — S88111D Complete traumatic amputation at level between knee and ankle, right lower leg, subsequent encounter: Secondary | ICD-10-CM

## 2024-01-07 DIAGNOSIS — Z89511 Acquired absence of right leg below knee: Secondary | ICD-10-CM | POA: Diagnosis not present

## 2024-01-07 NOTE — Progress Notes (Unsigned)
 Office Visit Note   Patient: Valerie Le           Date of Birth: 09-20-1970           MRN: [id number] Visit Date: 01/07/2024              Requested by: Vonita Quince, [place] No address on file PCP: Vonita Quince, [place]  Chief Complaint  Patient presents with   Right Leg - Follow-up    HX BKA 10/2021      HPI: 53 y/o female with right BKA.  She has lost girth in the BKA stump and will require a new prosthetic socket, liner and supplies.   Patient is wearing over 15 ply socks.  The liner is torn.  Assessment & Plan: Visit Diagnoses: No diagnosis found.  Plan: She will follow-up with [facility] for a new socket, liner and supplies.    Follow-Up Instructions: Return if symptoms worsen or fail to improve.   Ortho Exam  Patient is alert, oriented, no adenopathy, well-dressed, normal affect, normal respiratory effort. Well healed  right transtibial  amputee.  She has lost muscle mass and has a well healed stump.    Patient is a K2 level ambulator that will use a prosthesis to walk around their home and the community over low level environmental barriers.       Imaging: No results found. No images are attached to the encounter.  Labs: Lab Results  Component Value Date   HGBA1C 7.4 (H) 11/11/2021   HGBA1C 13.1 (H) 12/02/2020   ESRSEDRATE 84 (H) 11/02/2021   ESRSEDRATE 72 (H) 06/15/2021   CRP 24.7 (H) 06/15/2021   REPTSTATUS 11/07/2021 FINAL 11/02/2021   GRAMSTAIN  06/29/2021    RARE WBC PRESENT, PREDOMINANTLY MONONUCLEAR NO ORGANISMS SEEN    CULT  11/02/2021    NO GROWTH 5 DAYS Performed at [facility], [address] [place], [place] [address]    [facility] STAPHYLOCOCCUS EPIDERMIDIS 06/29/2021   LABORGA STAPHYLOCOCCUS LUGDUNENSIS 06/29/2021     Lab Results  Component Value Date   ALBUMIN 1.9 (L) 11/17/2021   ALBUMIN 2.1 (L) 11/03/2021   ALBUMIN 2.6 (L) 12/03/2020   PREALBUMIN 11.4 (L) 11/11/2021    Lab Results  Component Value Date    MG 1.9 11/11/2021   Lab Results  Component Value Date   VD25OH 33.19 11/17/2021   VD25OH 32.37 11/11/2021    Lab Results  Component Value Date   PREALBUMIN 11.4 (L) 11/11/2021      Latest Ref Rng & Units 11/21/2021    6:07 AM 11/17/2021    5:20 AM 11/13/2021    3:13 AM  CBC EXTENDED  WBC 4.0 - 10.5 K/uL 12.8  14.7  17.7   RBC 3.87 - 5.11 MIL/uL 3.38  3.25  3.59   Hemoglobin 12.0 - 15.0 g/dL 7.7  7.2  8.1   HCT 63.9 - 46.0 % 23.4  22.7  25.1   Platelets 150 - 400 K/uL 363  331  437   NEUT# 1.7 - 7.7 K/uL  9.3    Lymph# 0.7 - 4.0 K/uL  3.2       There is no height or weight on file to calculate BMI.  Orders:  No orders of the defined types were placed in this encounter.  No orders of the defined types were placed in this encounter.    Procedures: No procedures performed  Clinical Data: No additional findings.  ROS:  All other systems negative, except as  noted in the HPI. Review of Systems  Objective: Vital Signs: There were no vitals taken for this visit.  Specialty Comments:  No specialty comments available.  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 of skin (HCC)    Chest pain 11/02/2021   Elevated troponin 11/02/2021   SIRS (systemic inflammatory response syndrome) (HCC) 11/02/2021   Diabetes mellitus type 1 (HCC) 11/02/2021   Thrombocytosis 11/02/2021   Microcytic hypochromic anemia 11/02/2021   Hypokalemia 11/02/2021   Morbid obesity (HCC) 11/02/2021   Medication monitoring encounter 06/15/2021   Smoking 02/01/2021   Acute hematogenous osteomyelitis of right foot (HCC) 12/22/2020   Osteomyelitis of foot, right, acute (HCC)    Dehiscence of amputation stump (HCC)    Diabetic foot infection (HCC)  12/02/2020   Essential hypertension 12/02/2020   Hyperlipidemia 08/21/2018   Bipolar affective disorder, currently depressed, moderate (HCC) 01/23/2017   Insulin -treated type 2 diabetes mellitus (HCC) 03/07/2013   Asthma 03/07/2013   ADHD (attention deficit hyperactivity disorder) 03/07/2013   Migraine 03/07/2013   Tobacco abuse 03/07/2013   Past Medical History:  Diagnosis Date   Anemia    Asthma    as a child   Depression    Diabetes mellitus without complication (HCC)    Type 1 - Has Insulin  Pump   Headache    migraines   High cholesterol    Hypertension     History reviewed. No pertinent family history.  Past Surgical History:  Procedure Laterality Date   AMPUTATION Right 12/22/2020   Procedure: RIGHT TRANSMETATARSAL AMPUTATION;  Surgeon: Harden Jerona GAILS, MD;  Location[facility] OR;  Service: Orthopedics;  Laterality: Right;   AMPUTATION Right 06/29/2021   Procedure: REVISION RIGHT TRANSMETATARSAL AMPUTATION;  Surgeon: Harden Jerona GAILS, MD;  Location: [facility] OR;  Service: Orthopedics;  Laterality: Right;   AMPUTATION Right 11/11/2021   Procedure: RIGHT BELOW KNEE AMPUTATION;  Surgeon: Harden Jerona GAILS, MD;  Location: [facility] OR;  Service: Orthopedics;  Laterality: Right;   AMPUTATION TOE Right 12/04/2020   Procedure: AMPUTATION RIGHT SECOND TOE, I&D FOOT;  Surgeon: Barbarann Oneil BROCKS, MD;  Location: [facility] ORS;  Service: Orthopedics;  Laterality: Right;  Right second toe amputation, I&D right foot.   APPLICATION OF WOUND VAC  11/11/2021   Procedure: APPLICATION OF WOUND VAC;  Surgeon: Harden Jerona GAILS, MD;  Location: [facility] OR;  Service: Orthopedics;;   birth control implant  07/31/2006   Essure Implant   TONSILLECTOMY     TYMPANOSTOMY TUBE PLACEMENT     WISDOM TOOTH EXTRACTION     Social History   Occupational History   Not on file  Tobacco Use   Smoking status: Every Day    Current packs/day: 0.25    Average packs/day: 0.3 packs/day for 25.0 years (6.3 ttl pk-yrs)    Types: Cigarettes   Smokeless  tobacco: Never   Tobacco comments:    Cut down on smoking while on Chantix -  Vaping Use   Vaping status: Never Used  Substance and Sexual Activity   Alcohol use: Not Currently    Comment: Once a year on New Year's Day   Drug use: Not Currently   Sexual activity: Yes    Birth control/protection: Implant    Comment: Essure Implant

## 2024-01-08 ENCOUNTER — Encounter: Payer: Self-pay | Admitting: Physician Assistant

## 2024-01-23 ENCOUNTER — Telehealth: Payer: Self-pay | Admitting: Orthopedic Surgery

## 2024-01-23 NOTE — Telephone Encounter (Signed)
 Patient called and said she needs a prescription for prosthetic supplies. CB#[phone]

## 2024-01-24 NOTE — Telephone Encounter (Signed)
 Will you please write rx

## 2024-02-05 ENCOUNTER — Encounter: Attending: Physical Medicine and Rehabilitation | Admitting: Physical Medicine and Rehabilitation

## 2024-02-05 ENCOUNTER — Encounter: Payer: Self-pay | Admitting: Physical Medicine and Rehabilitation

## 2024-02-05 VITALS — BP 130/81 | HR 83 | Ht 71.0 in | Wt 264.0 lb

## 2024-02-05 DIAGNOSIS — E1142 Type 2 diabetes mellitus with diabetic polyneuropathy: Secondary | ICD-10-CM | POA: Diagnosis present

## 2024-02-05 DIAGNOSIS — Z794 Long term (current) use of insulin: Secondary | ICD-10-CM

## 2024-02-05 MED ORDER — AMITRIPTYLINE HCL 25 MG PO TABS: 25.0000 mg | ORAL_TABLET | Freq: Every evening | ORAL | 3 refills | Status: AC | PRN

## 2024-02-05 MED ORDER — HYDROCODONE-ACETAMINOPHEN 10-325 MG PO TABS: 1.0000 | ORAL_TABLET | Freq: Two times a day (BID) | ORAL | 0 refills | Status: AC | PRN

## 2024-02-05 MED ORDER — DULOXETINE HCL 20 MG PO CPEP: 20.0000 mg | ORAL_CAPSULE | Freq: Every day | ORAL | 3 refills | Status: AC

## 2024-02-05 MED ORDER — CAPSAICIN-CLEANSING GEL 8 % EX KIT
3.0000 | PACK | Freq: Once | CUTANEOUS | Status: AC
  Administered 2024-02-05: 3 via TOPICAL

## 2024-02-05 NOTE — Addendum Note (Signed)
 Addended by: LORILEE SVEN SQUIBB on: 02/05/2024 03:18 PM   Modules accepted: Orders

## 2024-02-05 NOTE — Progress Notes (Signed)
-  Discussed Qutenza  as an option for neuropathic pain control. Discussed that this is a capsaicin  patch, stronger than capsaicin  cream. Discussed that it is currently approved for diabetic peripheral neuropathy and post-herpetic neuralgia, but that it has also shown benefit in treating other forms of neuropathy. Provided patient with link to site to learn more about the patch: [email or web address] .[email or web address]/. Discussed that the patch would be placed in office and benefits usually last 3 months. Discussed that unintended exposure to capsaicin  can cause severe irritation of eyes, mucous membranes, respiratory tract, and skin, but that Qutenza  is a local treatment and does not have the systemic side effects of other nerve medications. Discussed that there may be pain, itching, erythema, and decreased sensory function associated with the application of Qutenza . Side effects usually subside within 1 week. A cold pack of analgesic medications can help with these side effects. Blood pressure can also be increased due to pain associated with administration of the patch.   3 patches of Qutenza  (35359) was applied to her right residual limb and the dorsum and sole of her left foot. Ice packs were applied during the procedure to ensure patient comfort. Blood pressure was monitored every 15 minutes. The patient tolerated the procedure well. Post-procedure instructions were given and follow-up has been scheduled.  Topical system measures 14cm x20cm (280cm for a total 1120units) were applied which will cause deeper penetration for destruction of the peripheral nerve using a chemical (Qutenza ) which infuses into the skin like an injection and heat technique (occlusive, compressive dressing cauing endothermic heat technique)

## 2024-02-11 ENCOUNTER — Telehealth: Payer: Self-pay

## 2024-02-11 NOTE — Telephone Encounter (Signed)
   Failed attempt to submit PA on Cover My Meds.  [employer] [place] sent forms to complete for PA.  Forms completed and faxed to [facility] [place] Nonformulary exception request/medical necessity prior authorization form.

## 2024-02-19 NOTE — Telephone Encounter (Signed)
 New prior authorization request initiated on Cover My Meds due to Member Not Found and patient has [employer] scanned in chart.  Benefits are currently being verified.

## 2024-02-21 NOTE — Telephone Encounter (Signed)
 PA request faxed to [facility] for review.

## 2024-04-16 ENCOUNTER — Other Ambulatory Visit: Payer: Self-pay

## 2024-04-16 MED ORDER — TOPIRAMATE 25 MG PO TABS: 25.0000 mg | ORAL_TABLET | Freq: Every evening | ORAL | 3 refills | Status: AC

## 2024-04-21 ENCOUNTER — Encounter: Payer: Self-pay | Admitting: Radiology

## 2024-05-06 ENCOUNTER — Encounter: Admitting: Physical Medicine and Rehabilitation

## 2024-07-15 ENCOUNTER — Encounter: Attending: Physical Medicine and Rehabilitation | Admitting: Physical Medicine and Rehabilitation

## 2024-07-18 ENCOUNTER — Telehealth: Payer: Self-pay

## 2024-07-18 NOTE — Telephone Encounter (Signed)
 Insurance sent fax stating they need: Current prescription form physician Relevant physician office notes Letter of Medical Necessity Relevant CPT/HCPC code For auth for [id number] or you can just drop the office code.

## 2024-08-28 ENCOUNTER — Encounter: Admitting: Physical Medicine and Rehabilitation
